# Patient Record
Sex: Female | Born: 1950
Health system: Southern US, Community
[De-identification: ages and names within clinical notes are randomized; demographics above are authoritative.]

## PROBLEM LIST (undated history)

## (undated) DIAGNOSIS — Z973 Presence of spectacles and contact lenses: Secondary | ICD-10-CM

## (undated) DIAGNOSIS — B977 Papillomavirus as the cause of diseases classified elsewhere: Secondary | ICD-10-CM

## (undated) DIAGNOSIS — E039 Hypothyroidism, unspecified: Secondary | ICD-10-CM

## (undated) DIAGNOSIS — C801 Malignant (primary) neoplasm, unspecified: Secondary | ICD-10-CM

## (undated) DIAGNOSIS — E785 Hyperlipidemia, unspecified: Secondary | ICD-10-CM

## (undated) DIAGNOSIS — M1990 Unspecified osteoarthritis, unspecified site: Secondary | ICD-10-CM

## (undated) DIAGNOSIS — Z972 Presence of dental prosthetic device (complete) (partial): Secondary | ICD-10-CM

## (undated) DIAGNOSIS — Z801 Family history of malignant neoplasm of trachea, bronchus and lung: Secondary | ICD-10-CM

## (undated) DIAGNOSIS — I209 Angina pectoris, unspecified: Secondary | ICD-10-CM

## (undated) DIAGNOSIS — K5792 Diverticulitis of intestine, part unspecified, without perforation or abscess without bleeding: Secondary | ICD-10-CM

## (undated) DIAGNOSIS — I10 Essential (primary) hypertension: Secondary | ICD-10-CM

## (undated) DIAGNOSIS — Z923 Personal history of irradiation: Secondary | ICD-10-CM

## (undated) HISTORY — PX: TONSILLECTOMY: SUR1361

## (undated) HISTORY — DX: Family history of malignant neoplasm of trachea, bronchus and lung: Z80.1

## (undated) HISTORY — DX: Diverticulitis of intestine, part unspecified, without perforation or abscess without bleeding: K57.92

## (undated) HISTORY — DX: Angina pectoris, unspecified: I20.9

## (undated) HISTORY — DX: Essential (primary) hypertension: I10

## (undated) HISTORY — PX: COLOSTOMY: SHX63

## (undated) HISTORY — DX: Papillomavirus as the cause of diseases classified elsewhere: B97.7

## (undated) HISTORY — DX: Hyperlipidemia, unspecified: E78.5

## (undated) HISTORY — PX: TUBAL LIGATION: SHX77

---

## 1999-10-17 ENCOUNTER — Other Ambulatory Visit: Admission: RE | Admit: 1999-10-17 | Discharge: 1999-10-17 | Payer: Self-pay | Admitting: *Deleted

## 1999-12-12 ENCOUNTER — Ambulatory Visit (HOSPITAL_COMMUNITY): Admission: RE | Admit: 1999-12-12 | Discharge: 1999-12-12 | Payer: Self-pay | Admitting: *Deleted

## 2001-09-10 ENCOUNTER — Ambulatory Visit (HOSPITAL_COMMUNITY): Admission: RE | Admit: 2001-09-10 | Discharge: 2001-09-10 | Payer: Self-pay | Admitting: Gastroenterology

## 2002-10-14 ENCOUNTER — Other Ambulatory Visit: Admission: RE | Admit: 2002-10-14 | Discharge: 2002-10-14 | Payer: Self-pay | Admitting: *Deleted

## 2003-10-18 ENCOUNTER — Other Ambulatory Visit: Admission: RE | Admit: 2003-10-18 | Discharge: 2003-10-18 | Payer: Self-pay | Admitting: *Deleted

## 2004-05-27 ENCOUNTER — Ambulatory Visit (HOSPITAL_COMMUNITY): Admission: RE | Admit: 2004-05-27 | Discharge: 2004-05-27 | Payer: Self-pay | Admitting: Obstetrics

## 2004-08-05 ENCOUNTER — Ambulatory Visit (HOSPITAL_COMMUNITY): Admission: RE | Admit: 2004-08-05 | Discharge: 2004-08-05 | Payer: Self-pay | Admitting: Obstetrics

## 2005-02-14 ENCOUNTER — Ambulatory Visit (HOSPITAL_COMMUNITY): Admission: RE | Admit: 2005-02-14 | Discharge: 2005-02-14 | Payer: Self-pay | Admitting: Gastroenterology

## 2010-12-29 HISTORY — PX: COLON SURGERY: SHX602

## 2011-01-19 ENCOUNTER — Encounter: Payer: Self-pay | Admitting: Obstetrics

## 2011-02-09 ENCOUNTER — Other Ambulatory Visit: Payer: Self-pay | Admitting: General Surgery

## 2011-02-10 ENCOUNTER — Ambulatory Visit (HOSPITAL_COMMUNITY): Admission: RE | Admit: 2011-02-10 | Payer: 59 | Source: Ambulatory Visit

## 2011-02-10 ENCOUNTER — Ambulatory Visit (INDEPENDENT_AMBULATORY_CARE_PROVIDER_SITE_OTHER): Payer: 59

## 2011-02-10 ENCOUNTER — Ambulatory Visit (HOSPITAL_COMMUNITY): Payer: 59

## 2011-02-10 ENCOUNTER — Inpatient Hospital Stay (HOSPITAL_COMMUNITY)
Admission: EM | Admit: 2011-02-10 | Discharge: 2011-02-20 | DRG: 330 | Disposition: A | Discharge status: Home / Self Care | Payer: 59 | Attending: Pulmonary Disease | Admitting: Pulmonary Disease

## 2011-02-10 ENCOUNTER — Inpatient Hospital Stay (INDEPENDENT_AMBULATORY_CARE_PROVIDER_SITE_OTHER)
Admission: RE | Admit: 2011-02-10 | Discharge: 2011-02-10 | Disposition: A | Discharge status: Home / Self Care | Payer: 59 | Source: Ambulatory Visit | Attending: Emergency Medicine | Admitting: Emergency Medicine

## 2011-02-10 ENCOUNTER — Inpatient Hospital Stay (HOSPITAL_COMMUNITY): Admit: 2011-02-10 | Discharge: 2011-02-10 | Payer: 59 | Attending: Emergency Medicine | Admitting: Emergency Medicine

## 2011-02-10 DIAGNOSIS — J45909 Unspecified asthma, uncomplicated: Secondary | ICD-10-CM | POA: Diagnosis present

## 2011-02-10 DIAGNOSIS — K5732 Diverticulitis of large intestine without perforation or abscess without bleeding: Secondary | ICD-10-CM

## 2011-02-10 DIAGNOSIS — I10 Essential (primary) hypertension: Secondary | ICD-10-CM | POA: Diagnosis present

## 2011-02-10 DIAGNOSIS — F17200 Nicotine dependence, unspecified, uncomplicated: Secondary | ICD-10-CM | POA: Diagnosis present

## 2011-02-10 DIAGNOSIS — K560 Paralytic ileus: Secondary | ICD-10-CM | POA: Diagnosis not present

## 2011-02-10 DIAGNOSIS — E876 Hypokalemia: Secondary | ICD-10-CM | POA: Diagnosis not present

## 2011-02-10 LAB — POCT URINALYSIS DIPSTICK
Nitrite: NEGATIVE
Protein, ur: >=300 mg/dL — AB
Urobilinogen, UA: >=8 mg/dL (ref 0.0–1.0)
pH: 6 (ref 5.0–8.0)

## 2011-02-10 LAB — CBC
Hemoglobin: 13.7 g/dL (ref 12.0–15.0)
MCH: 30.1 pg (ref 26.0–34.0)
RBC: 4.55 MIL/uL (ref 3.87–5.11)
RDW: 15.5 % (ref 11.5–15.5)

## 2011-02-10 LAB — POCT I-STAT, CHEM 8
BUN: 5 mg/dL — ABNORMAL LOW (ref 6–23)
Calcium, Ion: 1.08 mmol/L — ABNORMAL LOW (ref 1.12–1.32)
Chloride: 93 mEq/L — ABNORMAL LOW (ref 96–112)
Creatinine, Ser: 1 mg/dL (ref 0.4–1.2)
Glucose, Bld: 143 mg/dL — ABNORMAL HIGH (ref 70–99)
HCT: 39 % (ref 36.0–46.0)
Hemoglobin: 13.3 g/dL (ref 12.0–15.0)
TCO2: 32 mmol/L (ref 0–100)

## 2011-02-10 LAB — DIFFERENTIAL
Basophils Absolute: 0 10*3/uL (ref 0.0–0.1)
Basophils Relative: 0 % (ref 0–1)
Eosinophils Relative: 0 % (ref 0–5)
Lymphocytes Relative: 9 % — ABNORMAL LOW (ref 12–46)
Monocytes Absolute: 0.7 10*3/uL (ref 0.1–1.0)
Neutro Abs: 10.3 10*3/uL — ABNORMAL HIGH (ref 1.7–7.7)

## 2011-02-10 LAB — PROTIME-INR: INR: 1.06 (ref 0.00–1.49)

## 2011-02-10 LAB — TYPE AND SCREEN

## 2011-02-11 LAB — CBC
HCT: 35.6 % — ABNORMAL LOW (ref 36.0–46.0)
Hemoglobin: 12 g/dL (ref 12.0–15.0)
MCHC: 33.7 g/dL (ref 30.0–36.0)
RBC: 4.13 MIL/uL (ref 3.87–5.11)
RDW: 15.8 % — ABNORMAL HIGH (ref 11.5–15.5)
WBC: 11.4 10*3/uL — ABNORMAL HIGH (ref 4.0–10.5)

## 2011-02-11 LAB — BASIC METABOLIC PANEL
BUN: 4 mg/dL — ABNORMAL LOW (ref 6–23)
CO2: 30 mEq/L (ref 19–32)
Chloride: 98 mEq/L (ref 96–112)
Creatinine, Ser: 0.77 mg/dL (ref 0.4–1.2)
Glucose, Bld: 169 mg/dL — ABNORMAL HIGH (ref 70–99)
Potassium: 3.3 mEq/L — ABNORMAL LOW (ref 3.5–5.1)
Sodium: 137 mEq/L (ref 135–145)

## 2011-02-11 LAB — GLUCOSE, CAPILLARY: Glucose-Capillary: 131 mg/dL — ABNORMAL HIGH (ref 70–99)

## 2011-02-11 LAB — ABO/RH: ABO/RH(D): O POS

## 2011-02-12 LAB — CBC
MCH: 28.4 pg (ref 26.0–34.0)
MCHC: 32.8 g/dL (ref 30.0–36.0)
MCV: 86.4 fL (ref 78.0–100.0)
RBC: 3.91 MIL/uL (ref 3.87–5.11)
RDW: 16.1 % — ABNORMAL HIGH (ref 11.5–15.5)

## 2011-02-12 LAB — BASIC METABOLIC PANEL
CO2: 26 mEq/L (ref 19–32)
Calcium: 8 mg/dL — ABNORMAL LOW (ref 8.4–10.5)
GFR calc Af Amer: >60 mL/min (ref >60)
GFR calc non Af Amer: >60 mL/min (ref >60)
Sodium: 135 mEq/L (ref 135–145)

## 2011-02-12 LAB — GLUCOSE, CAPILLARY
Glucose-Capillary: 98 mg/dL (ref 70–99)
Glucose-Capillary: 90 mg/dL (ref 70–99)
Glucose-Capillary: 102 mg/dL — ABNORMAL HIGH (ref 70–99)

## 2011-02-13 LAB — GLUCOSE, CAPILLARY
Glucose-Capillary: 103 mg/dL — ABNORMAL HIGH (ref 70–99)
Glucose-Capillary: 105 mg/dL — ABNORMAL HIGH (ref 70–99)
Glucose-Capillary: 105 mg/dL — ABNORMAL HIGH (ref 70–99)
Glucose-Capillary: 97 mg/dL (ref 70–99)

## 2011-02-14 LAB — GLUCOSE, CAPILLARY
Glucose-Capillary: 105 mg/dL — ABNORMAL HIGH (ref 70–99)
Glucose-Capillary: 113 mg/dL — ABNORMAL HIGH (ref 70–99)
Glucose-Capillary: 91 mg/dL (ref 70–99)
Glucose-Capillary: 96 mg/dL (ref 70–99)

## 2011-02-14 LAB — BASIC METABOLIC PANEL
BUN: 6 mg/dL (ref 6–23)
Chloride: 102 mEq/L (ref 96–112)
GFR calc Af Amer: >60 mL/min (ref >60)
GFR calc non Af Amer: >60 mL/min (ref >60)
Potassium: 3.3 mEq/L — ABNORMAL LOW (ref 3.5–5.1)
Sodium: 142 mEq/L (ref 135–145)

## 2011-02-15 LAB — GLUCOSE, CAPILLARY
Glucose-Capillary: 111 mg/dL — ABNORMAL HIGH (ref 70–99)
Glucose-Capillary: 126 mg/dL — ABNORMAL HIGH (ref 70–99)

## 2011-02-16 LAB — GLUCOSE, CAPILLARY
Glucose-Capillary: 83 mg/dL (ref 70–99)
Glucose-Capillary: 91 mg/dL (ref 70–99)
Glucose-Capillary: 112 mg/dL — ABNORMAL HIGH (ref 70–99)

## 2011-02-16 NOTE — H&P (Signed)
Tracie [name], Tracie [name]             ACCOUNT NO.:  [id number]  MEDICAL RECORD NO.:  [id number]           PATIENT TYPE:  O  LOCATION:  URG                          FACILITY:  [facility]  PHYSICIAN:  Almond Lint, MD       DATE OF BIRTH:  [birth year]  DATE OF ADMISSION:  02/10/2011 DATE OF DISCHARGE:                             HISTORY & PHYSICAL   CHIEF COMPLAINT:  Free air.  HISTORY OF PRESENT ILLNESS:  Tracie [name] is a 60 year old female with around 3 days of abdominal pain.  Today is Monday and on Friday she developed severe left lower quadrant pain radiating around her left flank.  She had nausea and vomiting on that day and sweats and chills. Over the weekend, the pain subsided somewhat but she did have dysuria and suprapubic pain.  She is followed pain and point in time.  Today, the pain became worse and diffuse across the lower abdomen.  She went to her urgent care where an x-ray demonstrated free air.  She did not have any additional fever or chills.  She denied diarrhea.  She denied chest pain, shortness of breath.  She has no hematemesis or no hematuria.  She did complain of dark urine.  She is complaining of a bloody diarrhea. She did have dark urine that was reddish.  PAST MEDICAL HISTORY:  Significant for hypertension and asthma.  PAST SURGICAL HISTORY:  Tonsillectomy, tubal ligation, and C-section.  FAMILY HISTORY:  No sick contacts.  No severe illnesses that she is aware of.  SOCIAL HISTORY:  She is here with her 2 sons.  She is a smoker.  Does not drink alcohol or use drugs.  REVIEW OF SYSTEMS:  Otherwise negative x 11.  MEDICATIONS:  Hydrochlorothiazide and doxazosin.  ALLERGIES:  PEANUTS and FISH, although notably she does not have a shellfish allergy.  PHYSICAL EXAMINATION:  VITAL SIGNS:  Temperature 99, heart rate 112, blood pressure 144/88, respiratory rate 20, sat is 96% on room air. GENERAL:  Alert and oriented x3 in no acute distress. HEENT:   Normocephalic, atraumatic.  Sclerae are anicteric. NECK:  Supple.  No lymphadenopathy.  No thyromegaly.  Trachea is midline. HEART:  Regular rate and rhythm.  No murmurs. LUNGS:  Clear to auscultation bilaterally.  No wheezing, rales or rhonchi. ABDOMEN:  Soft, distended, tender in the bilateral lower quadrants and suprapubic region.  Positive rebound.  Positive voluntary guarding in the lower abdomen, nontender in the upper abdomen. EXTREMITIES:  Warm, well-perfused without pitting edema. SKIN:  No evidence of rashes. NEURO:  No gross motor or sensory deficits. PSYCH:  Mood and affect normal.  I-STAT shows a sodium of 137, potassium 2.6, chloride 93, CO2 32, BUN 5, creatinine 1.0 and glucose 143.  White count 12.1 hemoglobin and hematocrit 13.7 and 38.8 and platelet count 347.  UA shows large amount of blood and bilirubin and protein.  Coags are normal.  DIAGNOSTICS:  X-ray shows free air in the right hemidiaphragm with mild basilar atelectasis.  PLAN:  Tracie [name] will undergo exploratory laparotomy for repair of perforated viscus.  She will receive IV fluids, IV antibiotics and n.p.o..  I discussed the risks and benefits of surgery including the risk of potential cancer.  I also discussed the risk of an ostomy.  I discuss that an ostomy would require an extra surgery.  I discussed wound infection, bleeding and abscess.  The patient understands and wished to proceed.  Will go to the OR at the first available opportunity.     Almond Lint, MD     FB/MEDQ  D:  02/10/2011  T:  02/11/2011  Job:  [id number]  Electronically Signed by Almond Lint MD on 02/16/2011 12:17:41 AM

## 2011-02-16 NOTE — Op Note (Signed)
[name], [name]             ACCOUNT NO.:  [id number]  MEDICAL RECORD NO.:  [id number]           PATIENT TYPE:  LOCATION:                                 FACILITY:  PHYSICIAN:  Almond Lint, MD       DATE OF BIRTH:  09/23/1951  DATE OF PROCEDURE:  02/11/2011 DATE OF DISCHARGE:                              OPERATIVE REPORT   PREOPERATIVE DIAGNOSIS:  Free air.  POSTOPERATIVE DIAGNOSIS:  Perforated sigmoid diverticulitis.  PROCEDURES:  Sigmoidectomy with end descending colostomy in the left abdomen.  SURGEON:  Almond Lint, MD  ASSISTANT:  Clovis Pu. Cornett, MD  ANESTHESIA:  General.  FINDINGS:  Inflamed sigmoid with pus in the pelvis.  SPECIMEN:  Sigmoid colon to Pathology.  ESTIMATED BLOOD LOSS:  50 mL.  COMPLICATIONS:  None known.  PROCEDURE:  [name] was identified in the holding area and taken to the operating room where she was placed supine on the operating room table.  General anesthesia was induced.  Her abdomen was prepped anddraped in a sterile fashion after a Foley catheter was placed.  Time-out was performed according to the surgical safety check list.  When all was correct, we continued.    The infraumbilical skin was opened with a #10 blade and subcutaneous tissues and fascia were opened with cautery.  The peritoneum was elevated with two Tresa Endo and entered sharply with Metzenbaum scissors.  There was immediately a foul odor coming from the abdomen.  The peritoneum was opened to the full length of the fascial incision and the abdomen palpated.  There was clearly a very inflamed area of sigmoid and purulent drainage from the pelvis, so the incision was extended toward the pubic symphysis.  The remainder of the subcutaneous tissues and fascia were opened up toward the pubic symphysis.    The inflamed sigmoid was identified and elevated.  Around 3 cm proximal to the area of inflammation, the bowel was skeletonized and divided with the 75-mm GIA  stapler.  The inflamed area of sigmoid was elevated and the mesocolon was taken with the LigaSure.  There was one area that was suture-ligated as it was bleeding.  The sigmoid was taken down toward the rectum, and at the point where the tinea started to thin out the colon was divided with the GIA 75 again.  This staple line was marked with two 2-0 Prolene sutures.  The abdomen was then copiously irrigated with 7 L of normal saline.  This was done in all quadrants.  The area was irrigated until the irrigant returned clear.  Two Kochers were then used to pull the abdominal wall toward the midline and appropriate site for an ostomy was identified.  The skin was elevated with a Kocher and this tissue was taken with the cautery.  The subcutaneous tissues were divided all the way down into the fascia and this was opened up in a cruciate fashion. Minimal muscle fibers were split.  Three fingerbreadths were able to get through the fascia.  The sigmoid had to be mobilized more but taking down the white line of Toldt.  This was then passed through the  abdominal wall and held in place with [facility].  The omentum was then pulled down over the bowel and the fascia was closed with two #1 looped PDS suture.  The skin was copiously irrigated and then stapled.  This was covered with a towel and attention was then directed to the colostomy.  The colon was opened with the cautery and the colostomy was matured with four 4-0 Vicryl cutting sutures.  The wound was then dressed with a Tegaderm and an ostomy appliance.  The patient was awakened from anesthesia and taken to the PACU in stable condition. Needle, sponge, instrument counts were correct times two.     Almond Lint, MD     FB/MEDQ  D:  02/11/2011  T:  02/11/2011  Job:  [id number]  Electronically Signed by Almond Lint MD on 02/16/2011 12:19:31 AM

## 2011-02-17 LAB — BASIC METABOLIC PANEL
BUN: 2 mg/dL — ABNORMAL LOW (ref 6–23)
Calcium: 8.2 mg/dL — ABNORMAL LOW (ref 8.4–10.5)
Creatinine, Ser: 0.6 mg/dL (ref 0.4–1.2)
GFR calc non Af Amer: >60 mL/min (ref >60)
Glucose, Bld: 100 mg/dL — ABNORMAL HIGH (ref 70–99)

## 2011-02-17 LAB — CBC
HCT: 29.4 % — ABNORMAL LOW (ref 36.0–46.0)
MCHC: 33.7 g/dL (ref 30.0–36.0)
MCV: 84.7 fL (ref 78.0–100.0)
RDW: 16 % — ABNORMAL HIGH (ref 11.5–15.5)

## 2011-02-17 LAB — GLUCOSE, CAPILLARY: Glucose-Capillary: 144 mg/dL — ABNORMAL HIGH (ref 70–99)

## 2011-02-18 LAB — BASIC METABOLIC PANEL
BUN: 2 mg/dL — ABNORMAL LOW (ref 6–23)
GFR calc Af Amer: >60 mL/min (ref >60)
GFR calc non Af Amer: >60 mL/min (ref >60)
Potassium: 3.7 mEq/L (ref 3.5–5.1)
Sodium: 142 mEq/L (ref 135–145)

## 2011-02-18 LAB — GLUCOSE, CAPILLARY

## 2011-02-18 LAB — MAGNESIUM: Magnesium: 2 mg/dL (ref 1.5–2.5)

## 2011-02-19 LAB — BASIC METABOLIC PANEL
BUN: 4 mg/dL — ABNORMAL LOW (ref 6–23)
Creatinine, Ser: 0.63 mg/dL (ref 0.4–1.2)
GFR calc non Af Amer: >60 mL/min (ref >60)
Potassium: 4.8 mEq/L (ref 3.5–5.1)

## 2011-02-19 LAB — GLUCOSE, CAPILLARY: Glucose-Capillary: 141 mg/dL — ABNORMAL HIGH (ref 70–99)

## 2011-02-20 LAB — GLUCOSE, CAPILLARY

## 2011-02-21 LAB — GLUCOSE, CAPILLARY: Glucose-Capillary: 101 mg/dL — ABNORMAL HIGH (ref 70–99)

## 2011-02-25 NOTE — Discharge Summary (Signed)
  NAMECHABELY, [name]             ACCOUNT NO.:  [id number]  MEDICAL RECORD NO.:  [id number]           PATIENT TYPE:  I  LOCATION:  5003                         FACILITY:  [facility]  PHYSICIAN:  Tracie [name], M.D. DATE OF BIRTH:  [birth year]  DATE OF ADMISSION:  02/10/2011 DATE OF DISCHARGE:  02/20/2011                              DISCHARGE SUMMARY   HISTORY OF PRESENT ILLNESS:  Tracie [name] is a 60 year old African American female who presented with complaint of abdominal pain, nausea, vomiting, chills, and sweats.  She presented to the emergency department where evaluation including labs and plain films showed elevated white blood cell count and pneumoperitoneum.  Decision was made by Dr. Donell Beers after evaluation to take the patient directly to the operating room for surgical intervention.  SUMMARY OF HOSPITAL COURSE:  The patient was taken to the operating room on February 14 at 2 a.m. for exploratory laparotomy, sigmoid colectomy with [facility] pouch, and end colostomy.  The patient tolerated this procedure well, was admitted to the floor in a stable condition.  She had mild postoperative ileus, gradually regaining function of her bowels, starting to have some flatus come into her ostomy bag, and eventually having a small amount of loose stool develop.  She was started on liquid diet and gradually advanced from liquids to full liquids to regular diet.  Additionally, the patient who had a history of hypertension was placed on several antihypertensive agents including her home medications of doxazosin and hydrochlorothiazide.  She had a clonidine patch placed and some occasional hydralazine was used to maintain her blood pressure.  By the end of her hospitalization, her blood pressure was consistently in the 130s/80s to 140s/90s. With regards to her wound and ostomy, the patient's wound did develop some mild erythema that remained nontender and showed no evidence of drainage of  purulence or serous fluid.  Her ostomy remained pink and viable.  The ostomy wound care nurse discussed teaching and changing of her appliance thoroughly with her on several occasions.  At this point, we feel the patient is stable for discharge home with plans for followup as an outpatient.  DISCHARGE DIAGNOSES: 1. Perforated sigmoid diverticulitis, status post Hartmann procedure. 2. Hypertension - controlled. 3. Hypokalemia - repleted.  PLAN:  We will send the patient home with instructions for ostomy care and wound care.  She can shower daily keeping a dry dressing over the wound if necessary.  DISCHARGE MEDICATIONS: 1. Doxazosin 1 mg daily. 2. Hydrochlorothiazide 25 mg one-half tablet daily. 3. Percocet 5/325 one to two tablets q.6 h. p.r.n. 4. Augmentin 875 mg twice daily.  The patient is given preprinted discharge instructions to follow and will follow up in our office in approximately 1 week's time.     Tracie El, PA-C   ______________________________ Tracie [name], M.D.    KB/MEDQ  D:  02/20/2011  T:  02/20/2011  Job:  [id number]  cc:   Tracie Lint, MD  Electronically Signed by Tracie [name]  on 02/25/2011 11:32:00 AM Electronically Signed by Tracie [name] M.D. on 02/25/2011 03:10:57 PM

## 2011-03-04 ENCOUNTER — Ambulatory Visit (HOSPITAL_COMMUNITY)
Admission: RE | Admit: 2011-03-04 | Discharge: 2011-03-04 | Disposition: A | Discharge status: Home / Self Care | Payer: 59 | Source: Ambulatory Visit | Attending: General Surgery | Admitting: General Surgery

## 2011-03-04 DIAGNOSIS — M79609 Pain in unspecified limb: Secondary | ICD-10-CM | POA: Insufficient documentation

## 2011-03-04 DIAGNOSIS — I10 Essential (primary) hypertension: Secondary | ICD-10-CM | POA: Insufficient documentation

## 2011-06-26 ENCOUNTER — Telehealth (INDEPENDENT_AMBULATORY_CARE_PROVIDER_SITE_OTHER): Payer: Self-pay

## 2011-07-25 ENCOUNTER — Ambulatory Visit (INDEPENDENT_AMBULATORY_CARE_PROVIDER_SITE_OTHER): Payer: Self-pay | Admitting: General Surgery

## 2011-07-29 ENCOUNTER — Encounter (INDEPENDENT_AMBULATORY_CARE_PROVIDER_SITE_OTHER): Payer: Self-pay

## 2011-08-04 ENCOUNTER — Encounter (INDEPENDENT_AMBULATORY_CARE_PROVIDER_SITE_OTHER): Payer: Self-pay | Admitting: General Surgery

## 2011-08-04 ENCOUNTER — Ambulatory Visit (INDEPENDENT_AMBULATORY_CARE_PROVIDER_SITE_OTHER): Payer: 59 | Admitting: General Surgery

## 2011-08-04 VITALS — BP 172/114 | HR 70 | Wt 172.2 lb

## 2011-08-04 DIAGNOSIS — K5732 Diverticulitis of large intestine without perforation or abscess without bleeding: Secondary | ICD-10-CM

## 2011-08-04 DIAGNOSIS — K5720 Diverticulitis of large intestine with perforation and abscess without bleeding: Secondary | ICD-10-CM | POA: Insufficient documentation

## 2011-08-04 DIAGNOSIS — I10 Essential (primary) hypertension: Secondary | ICD-10-CM

## 2011-08-04 MED ORDER — DOXAZOSIN MESYLATE 1 MG PO TABS: 1.0000 mg | ORAL_TABLET | Freq: Every day | ORAL | Status: DC

## 2011-08-04 NOTE — Assessment & Plan Note (Signed)
Stressed importance of PCP Gave one month refill of cardura

## 2011-08-04 NOTE — Assessment & Plan Note (Signed)
Doing well Back to work Appetite and energy level back to normal Has small peristomal hernia Plan colostomy takedown after patient gets colonoscopy.   Pt would like October date.

## 2011-08-04 NOTE — Progress Notes (Signed)
Tracie [name] is a 60 y.o. female.    Chief Complaint  Patient presents with  . Other    3 month colon recheck    HPI HPI Pt is doing well.  She has not been having abdominal pain.  She does have some itching around her stoma.  She denies difficulty with appetite.  She denies fevers/chills.  She has noted some swelling around her ostomy.  She has returned to her prior energy level.  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    Past Medical History  Diagnosis Date  . Diverticulitis   . Hypertension   . Asthma   . HPV in female     Past Surgical History  Procedure Date  . Colon surgery 2012    sigmoidectomy    Family History  Problem Relation Age of Onset  . Cancer Mother   . Hypertension Brother   . Hypertension Brother     Social History History  Substance Use Topics  . Smoking status: Current Everyday Smoker  . Smokeless tobacco: Not on file  . Alcohol Use: Yes     occasional    Allergies  Allergen Reactions  . Fish Allergy Anaphylaxis and Swelling    Swelling of hands and feet.  . Peanut-Containing Drug Products Anaphylaxis    Tree nuts included in allergic reaction.    Current Outpatient Prescriptions  Medication Sig Dispense Refill  . doxazosin (CARDURA) 1 MG tablet Take 1 tablet (1 mg total) by mouth at bedtime.  30 tablet  0  . hydrochlorothiazide (,MICROZIDE/HYDRODIURIL,) 12.5 MG capsule Take 12.5 mg by mouth daily.        Marland Kitchen oxyCODONE-acetaminophen (PERCOCET) 5-325 MG per tablet Take 1 tablet by mouth every 6 (six) hours as needed. 1-2 q 6 hrs prn pain         Review of Systems Review of Systems  All other systems reviewed and are negative.    Physical Exam Physical Exam  Constitutional: She is oriented to person, place, and time. She appears well-developed and well-nourished. No distress.  HENT:  Head: Normocephalic and atraumatic.  Eyes: Conjunctivae are normal. Pupils are equal, round, and reactive to light. No scleral icterus.  Neck:  Normal range of motion. Neck supple.  GI: Bowel sounds are normal. She exhibits no distension. Mass: peristomal hernia. There is no tenderness. There is no rebound and no guarding.  Musculoskeletal: Normal range of motion. She exhibits no edema and no tenderness.  Neurological: She is alert and oriented to person, place, and time. Coordination normal.  Skin: Skin is warm and dry. No rash noted. She is not diaphoretic. No erythema. No pallor.  Psychiatric: She has a normal mood and affect. Her behavior is normal. Judgment and thought content normal.     Blood pressure 172/114, pulse 70, weight 172 lb 3.2 oz (78.109 kg).  Assessment/Plan   Diverticulitis of colon with perforation, sigmoid colectomy/ostomy 02/11/2011 Doing well Back to work Appetite and energy level back to normal Has small peristomal hernia Plan colostomy takedown after patient gets colonoscopy.   Pt would like October date.    Benign essential HTN Stressed importance of PCP Gave one month refill of cardura      Blane Worthington 08/04/2011, 12:07 PM

## 2011-09-16 ENCOUNTER — Other Ambulatory Visit (INDEPENDENT_AMBULATORY_CARE_PROVIDER_SITE_OTHER): Payer: Self-pay | Admitting: General Surgery

## 2011-09-16 DIAGNOSIS — K5792 Diverticulitis of intestine, part unspecified, without perforation or abscess without bleeding: Secondary | ICD-10-CM

## 2011-09-18 ENCOUNTER — Telehealth (INDEPENDENT_AMBULATORY_CARE_PROVIDER_SITE_OTHER): Payer: Self-pay | Admitting: General Surgery

## 2011-09-30 ENCOUNTER — Encounter (HOSPITAL_COMMUNITY)
Admission: RE | Admit: 2011-09-30 | Discharge: 2011-09-30 | Disposition: A | Discharge status: Home / Self Care | Payer: 59 | Source: Ambulatory Visit | Attending: General Surgery | Admitting: General Surgery

## 2011-09-30 ENCOUNTER — Other Ambulatory Visit (INDEPENDENT_AMBULATORY_CARE_PROVIDER_SITE_OTHER): Payer: Self-pay | Admitting: General Surgery

## 2011-09-30 DIAGNOSIS — Z01811 Encounter for preprocedural respiratory examination: Secondary | ICD-10-CM

## 2011-09-30 LAB — BASIC METABOLIC PANEL
CO2: 32 mEq/L (ref 19–32)
Chloride: 97 mEq/L (ref 96–112)
Glucose, Bld: 100 mg/dL — ABNORMAL HIGH (ref 70–99)
Potassium: 3.1 mEq/L — ABNORMAL LOW (ref 3.5–5.1)
Sodium: 139 mEq/L (ref 135–145)

## 2011-09-30 LAB — SURGICAL PCR SCREEN: Staphylococcus aureus: POSITIVE — AB

## 2011-09-30 LAB — CBC
Hemoglobin: 13.5 g/dL (ref 12.0–15.0)
RBC: 4.66 MIL/uL (ref 3.87–5.11)
WBC: 6.6 10*3/uL (ref 4.0–10.5)

## 2011-10-03 NOTE — Progress Notes (Signed)
Quick Note:  Labs ok for surgery ______ 

## 2011-10-06 ENCOUNTER — Other Ambulatory Visit (INDEPENDENT_AMBULATORY_CARE_PROVIDER_SITE_OTHER): Payer: Self-pay | Admitting: General Surgery

## 2011-10-06 ENCOUNTER — Inpatient Hospital Stay (HOSPITAL_COMMUNITY)
Admission: RE | Admit: 2011-10-06 | Discharge: 2011-10-12 | DRG: 346 | Disposition: A | Discharge status: Home / Self Care | Payer: 59 | Source: Ambulatory Visit | Attending: General Surgery | Admitting: General Surgery

## 2011-10-06 DIAGNOSIS — J45909 Unspecified asthma, uncomplicated: Secondary | ICD-10-CM | POA: Diagnosis present

## 2011-10-06 DIAGNOSIS — Z433 Encounter for attention to colostomy: Secondary | ICD-10-CM

## 2011-10-06 DIAGNOSIS — J984 Other disorders of lung: Secondary | ICD-10-CM | POA: Diagnosis present

## 2011-10-06 DIAGNOSIS — Z8619 Personal history of other infectious and parasitic diseases: Secondary | ICD-10-CM

## 2011-10-06 DIAGNOSIS — Z01812 Encounter for preprocedural laboratory examination: Secondary | ICD-10-CM

## 2011-10-06 DIAGNOSIS — F17200 Nicotine dependence, unspecified, uncomplicated: Secondary | ICD-10-CM | POA: Diagnosis present

## 2011-10-06 DIAGNOSIS — Z809 Family history of malignant neoplasm, unspecified: Secondary | ICD-10-CM

## 2011-10-06 DIAGNOSIS — K5732 Diverticulitis of large intestine without perforation or abscess without bleeding: Principal | ICD-10-CM | POA: Diagnosis present

## 2011-10-06 DIAGNOSIS — I10 Essential (primary) hypertension: Secondary | ICD-10-CM | POA: Diagnosis present

## 2011-10-06 HISTORY — PX: COLOSTOMY TAKEDOWN: SHX5258

## 2011-10-06 LAB — COMPREHENSIVE METABOLIC PANEL
ALT: 14 U/L (ref 0–35)
AST: 15 U/L (ref 0–37)
Calcium: 10 mg/dL (ref 8.4–10.5)
Creatinine, Ser: 0.74 mg/dL (ref 0.50–1.10)
GFR calc Af Amer: >90 mL/min (ref >90)
Sodium: 134 mEq/L — ABNORMAL LOW (ref 135–145)
Total Protein: 7.8 g/dL (ref 6.0–8.3)

## 2011-10-06 LAB — TYPE AND SCREEN: ABO/RH(D): O POS

## 2011-10-07 LAB — CBC
Hemoglobin: 13.4 g/dL (ref 12.0–15.0)
MCV: 86.3 fL (ref 78.0–100.0)
Platelets: 327 10*3/uL (ref 150–400)
RBC: 4.59 MIL/uL (ref 3.87–5.11)
WBC: 6.7 10*3/uL (ref 4.0–10.5)

## 2011-10-07 LAB — BASIC METABOLIC PANEL
BUN: 6 mg/dL (ref 6–23)
Chloride: 95 mEq/L — ABNORMAL LOW (ref 96–112)
GFR calc Af Amer: >90 mL/min (ref >90)
Glucose, Bld: 162 mg/dL — ABNORMAL HIGH (ref 70–99)
Potassium: 3 mEq/L — ABNORMAL LOW (ref 3.5–5.1)
Sodium: 133 mEq/L — ABNORMAL LOW (ref 135–145)

## 2011-10-07 LAB — MAGNESIUM: Magnesium: 1.8 mg/dL (ref 1.5–2.5)

## 2011-10-08 LAB — BASIC METABOLIC PANEL
BUN: 4 mg/dL — ABNORMAL LOW (ref 6–23)
Chloride: 96 mEq/L (ref 96–112)
Creatinine, Ser: 0.62 mg/dL (ref 0.50–1.10)
Glucose, Bld: 143 mg/dL — ABNORMAL HIGH (ref 70–99)
Potassium: 3.2 mEq/L — ABNORMAL LOW (ref 3.5–5.1)

## 2011-10-08 LAB — CBC
HCT: 35.9 % — ABNORMAL LOW (ref 36.0–46.0)
Hemoglobin: 12.1 g/dL (ref 12.0–15.0)
MCV: 86.5 fL (ref 78.0–100.0)
WBC: 9 10*3/uL (ref 4.0–10.5)

## 2011-10-09 LAB — BASIC METABOLIC PANEL
BUN: 4 mg/dL — ABNORMAL LOW (ref 6–23)
Calcium: 9.3 mg/dL (ref 8.4–10.5)
Creatinine, Ser: 0.62 mg/dL (ref 0.50–1.10)
GFR calc non Af Amer: >90 mL/min (ref >90)
Glucose, Bld: 128 mg/dL — ABNORMAL HIGH (ref 70–99)
Potassium: 3.6 mEq/L (ref 3.5–5.1)

## 2011-10-09 LAB — CBC
HCT: 35.4 % — ABNORMAL LOW (ref 36.0–46.0)
Hemoglobin: 11.9 g/dL — ABNORMAL LOW (ref 12.0–15.0)
MCH: 29 pg (ref 26.0–34.0)
MCHC: 33.6 g/dL (ref 30.0–36.0)
MCV: 86.3 fL (ref 78.0–100.0)

## 2011-10-10 LAB — POTASSIUM: Potassium: 3.5 mEq/L (ref 3.5–5.1)

## 2011-10-10 LAB — CBC
Hemoglobin: 11.6 g/dL — ABNORMAL LOW (ref 12.0–15.0)
MCH: 28.9 pg (ref 26.0–34.0)
MCHC: 33.3 g/dL (ref 30.0–36.0)
RDW: 16.2 % — ABNORMAL HIGH (ref 11.5–15.5)

## 2011-10-14 NOTE — Op Note (Signed)
NAMEFRANCHELLE, Tracie [name]             ACCOUNT NO.:  [id number]  MEDICAL RECORD NO.:  [id number]  LOCATION:  2899                         FACILITY:  [facility]  PHYSICIAN:  Almond Lint, MD       DATE OF BIRTH:  1951/08/28  DATE OF PROCEDURE:  10/06/2011 DATE OF DISCHARGE:                              OPERATIVE REPORT   PREOPERATIVE DIAGNOSIS:  Diverticulitis.  POSTOPERATIVE DIAGNOSIS:  Diverticulitis.  PROCEDURE PERFORMED:  Colostomy takedown.  SURGEON:  Almond Lint, MD  ASSISTANT:  Dr. Ollen Gross. Vernell Morgans, MD  ANESTHESIA:  General.  FINDINGS:  Some adhesions.  SPECIMEN:  Proximal rectum to pathology.  ESTIMATED BLOOD LOSS:  50 mL.  COMPLICATIONS:  None known.  PROCEDURE:  Ms. Trinka was identified in the holding area and taken to the operating room where she was placed supine on the operating room table.  General anesthesia was induced.  She was placed into the low lithotomy position.  Several mucous balls were removed from her rectum. This was irrigated.  The abdomen and perineum were prepped and draped in standard fashion.  The ostomy was closed with a Vicryl suture.  Midline incision was opened with a #10 blade.  Subcutaneous tissues were divided with the cautery.  The fascia was elevated with 2 Kochers and entered sharply with a #10 blade.  The digital retraction was then used to protect the intraabdominal contents while the fascia was opened in the length of the skin incision.  The Kochers were then used to elevate the abdominal wall and the adhesions were taken off behind the abdominal wall.   The Bookwalter retractor was then placed to assist with visualization.  Most of the adhesions around the back of the ostomy were taken down and then the rectal stump was identified and elevated.  There was good length on the stump.  The attention was then directed to the ostomy.  The skin was incised with a #10 blade around the ostomy and then the cautery was used to divide the  subcutaneous tissue.  A tonsil was used to get into the pericolonic plane.  This was elevated and the remainder of the adhesions were taken off of the stomal end.  This was taken and retracted back into the abdomen.  The end was removed with cautery.    This was sized and it was felt that a 29 EEA would make a good anastomosis.  The area was checked from the rectum and the 29 also would enter from the rectum.  The 29 EEA stapler was then passed from the anus.  At the sacral promontory, the stapler did not want to passes easily into the colon.  The mesentery was divided with the ligature and the colon was stapled off with the contour stapler.    The anvil was then secured into the proximal colon with the 0 Prolene pursestring suture.  This was then coupled to the spike from the other end of the stapler and closed.  The fallopian tube was pulled out of the way.  The abdominal organs were intact.  The anterior wall appeared a little bit thin and several reinforcing 3-0 Vicryl sutures were placed on the anterior anastomosis.  This was then tested with a proctoscope and there was no sign of air leakage from the anastomosis.  The ostomy site was then closed with 2 layers of #1 Novafil pop-off sutures.    The On-Q tunneler was then placed on either side of the incision.  The abdomen was irrigated.  Seprafilm was placed in the abdomen on top of the abdominal contents and behind the ostomy incision.  The fascia was closed with #1 looped PDS suture.  The skin was then irrigated and closed with staples.  Patient tolerated procedure well.  She was awakened from anesthesia and taken to the PACU in stable condition.  Needle, sponge, and instrument counts were correct.     Almond Lint, MD     FB/MEDQ  D:  10/06/2011  T:  10/06/2011  Job:  [id number]  Electronically Signed by Almond Lint MD on 10/14/2011 08:12:46 PM

## 2011-10-15 ENCOUNTER — Ambulatory Visit (INDEPENDENT_AMBULATORY_CARE_PROVIDER_SITE_OTHER): Payer: 59

## 2011-10-15 ENCOUNTER — Other Ambulatory Visit (INDEPENDENT_AMBULATORY_CARE_PROVIDER_SITE_OTHER): Payer: Self-pay | Admitting: General Surgery

## 2011-10-15 DIAGNOSIS — G8918 Other acute postprocedural pain: Secondary | ICD-10-CM

## 2011-10-15 MED ORDER — HYDROCODONE-ACETAMINOPHEN 5-325 MG PO TABS: 1.0000 | ORAL_TABLET | Freq: Four times a day (QID) | ORAL | Status: AC | PRN

## 2011-10-15 NOTE — Progress Notes (Signed)
The pt is here today 1 week post from her colostomy takedown for staple removal and dressing change.  The wounds are healing well.  I removed her staples and the gauze packing from the previous colostomy site, and applied steri strips.  I repacked the colostomy site wound with 2" gauze and instructed the pt to remove the gauze tomorrow when she showered, and replace it with another.  She seemed to understand these instructions.  She will return in 3 weeks to see Dr. Donell Beers.

## 2011-10-31 ENCOUNTER — Encounter (INDEPENDENT_AMBULATORY_CARE_PROVIDER_SITE_OTHER): Payer: Self-pay | Admitting: General Surgery

## 2011-10-31 ENCOUNTER — Ambulatory Visit (INDEPENDENT_AMBULATORY_CARE_PROVIDER_SITE_OTHER): Payer: 59 | Admitting: General Surgery

## 2011-10-31 VITALS — BP 128/92 | HR 91 | Temp 97.2°F | Ht 63.5 in | Wt 169.4 lb

## 2011-10-31 DIAGNOSIS — K5720 Diverticulitis of large intestine with perforation and abscess without bleeding: Secondary | ICD-10-CM

## 2011-10-31 DIAGNOSIS — K5732 Diverticulitis of large intestine without perforation or abscess without bleeding: Secondary | ICD-10-CM

## 2011-10-31 MED ORDER — TRAMADOL HCL 50 MG PO TABS: 50.0000 mg | ORAL_TABLET | Freq: Four times a day (QID) | ORAL | Status: AC | PRN

## 2011-10-31 NOTE — Assessment & Plan Note (Addendum)
Doing ok. Around where she should be at 3 weeks post op. Follow up in 4 weeks. Will need 8 weeks out of work.

## 2011-10-31 NOTE — Progress Notes (Signed)
HISTORY: Pt is doing well status post colostomy takedown.  She is having normal bowel movements most of the time, but has had some issue with constipation.  She had some bright red blood per rectum in the hospital, but her CBC was stable in house.  This has completely resolved.  Her appetite is slowly improving.  She is still tired much of the time.  She is trying to eliminate narcotic use.  She is not having any nausea/vomiting/fever/chills.    EXAM: Head: Normocephalic and atraumatic.  Eyes:  Conjunctivae are normal. Pupils are equal, round, and reactive to light. No scleral icterus.  Resp: No respiratory distress, normal effort. Abd:  Abdomen is soft, non distended and non tender. Incision is healing well.  There is no edema, erythema, or drainage at midline or LLQ ostomy incision.   Neurological: Alert and oriented to person, place, and time. Coordination normal.  Skin: Skin is warm and dry. No rash noted. No diaphoretic. No erythema. No pallor.  Psychiatric: Normal mood and affect. Normal behavior. Judgment and thought content normal.    Pathology reviewed and demonstrates:  BENIGN COLORECTAL MUCOSA. - NO SIGNIFICANT INFLAMMATION, DYSPLASIA OR MALIGNANCY IDENTIFIED.  ASSESSMENT AND PLAN:   Diverticulitis of colon with perforation, sigmoid colectomy/ostomy 02/11/2011 Doing ok. Around where she should be at 3 weeks post op. Follow up in 4 weeks. Will need 8 weeks out of work.         Maudry Diego, MD Surgical Oncology, General & Endocrine Surgery [facility]  No primary provider on file. No ref. provider found

## 2011-11-06 NOTE — Discharge Summary (Signed)
Tracie [name], Tracie [name]             ACCOUNT NO.:  [id number]  MEDICAL RECORD NO.:  [id number]  LOCATION:  5150                         FACILITY:  [facility]  PHYSICIAN:  Almond Lint, MD       DATE OF BIRTH:  December 13, 1951  DATE OF ADMISSION:  10/06/2011 DATE OF DISCHARGE:  10/12/2011                              DISCHARGE SUMMARY   PRINCIPAL FINAL DISCHARGE DIAGNOSIS:  Diverticulitis.  SECONDARY DIAGNOSES:  Hypertension, asthma and Human papillomavirus.  DISCHARGE MEDICATIONS: 1. Hydrochlorothiazide 25 mg once a day. 2. Doxazosin 1 mg once a day. 3. Albuterol inhaler as needed. 4. Ibuprofen 200 mg q.4 hours as needed for pain.  DISCHARGE INSTRUCTIONS:  No heavy lifting for 8 weeks.  No driving for 2 weeks.  Heart-healthy diet, and dry dressing as needed to abdominal wounds.  PRINCIPAL PROCEDURE:  Colostomy take down.  HOSPITAL COURSE:  The patient was admitted to the hospital floor following her colostomy takedown.  Also, she had hypokalemia while inpatient that required repletion.  She was able to void spontaneously with her catheter out.  She was started on incentive spirometer and assisted with ambulation.  She was on a PCA initially for pain control. Her bowel function started to improve, and she started to pass gas.  Her diet was advanced.  She was transitioned to oral narcotics and was tolerable at that time.  She did have a little bit nausea and postop ileus.  This resolved and the patient was discharged to home on postop day 6 in improved condition.  LABORATORY DATA:  The patient had a discharge hematocrit of 34.8, which was stable and her potassium was up to 3.5 prior to discharge.     Almond Lint, MD     FB/MEDQ  D:  11/06/2011  T:  11/06/2011  Job:  [id number]

## 2011-11-28 ENCOUNTER — Ambulatory Visit (INDEPENDENT_AMBULATORY_CARE_PROVIDER_SITE_OTHER): Payer: 59 | Admitting: General Surgery

## 2011-11-28 ENCOUNTER — Encounter (INDEPENDENT_AMBULATORY_CARE_PROVIDER_SITE_OTHER): Payer: Self-pay | Admitting: General Surgery

## 2011-11-28 VITALS — BP 154/100 | HR 80 | Temp 96.9°F | Resp 16 | Ht 63.0 in | Wt 168.8 lb

## 2011-11-28 DIAGNOSIS — K5720 Diverticulitis of large intestine with perforation and abscess without bleeding: Secondary | ICD-10-CM

## 2011-11-28 DIAGNOSIS — K5732 Diverticulitis of large intestine without perforation or abscess without bleeding: Secondary | ICD-10-CM

## 2011-11-28 NOTE — Assessment & Plan Note (Signed)
Doing well. Ok to return to work Follow up as needed.

## 2011-11-28 NOTE — Progress Notes (Signed)
HISTORY: Patient is status post open colostomy takedown on 10 8 2012. She is doing well. Her energy level is still low but is improving. She is no longer taking any analgesic medication. She denies nausea  or vomiting. Her appetite is continuing to improve. She is having issues of constipation and gas. She occasionally feels some pressure in the left lower quadrant. She denies fevers, chills, or wound drainage.   PERTINENT REVIEW OF SYSTEMS: Otherwise negative.     EXAM: Head: Normocephalic and atraumatic.  Eyes:  Conjunctivae are normal. Pupils are equal, round, and reactive to light. No scleral icterus.  Neck:  Normal range of motion. Neck supple. No tracheal deviation present. No thyromegaly present.  Resp: No respiratory distress, normal effort. Abd:  Abdomen is soft, non distended and non tender. No masses are palpable.  There is no rebound and no guarding.  Neurological: Alert and oriented to person, place, and time. Coordination normal.  Skin: Skin is warm and dry. No rash noted. No diaphoretic. No erythema. No pallor.  Psychiatric: Normal mood and affect. Normal behavior. Judgment and thought content normal.    ASSESSMENT AND PLAN:   Diverticulitis of colon with perforation, sigmoid colectomy/ostomy 02/11/2011, ostomy takedown 10/06/2011 Doing well. Ok to return to work Follow up as needed.         Maudry Diego, MD Surgical Oncology, General & Endocrine Surgery [facility]  No primary provider on file. No ref. provider found

## 2011-11-28 NOTE — Patient Instructions (Signed)
Add stool softeners and laxatives as needed for constipation.

## 2014-09-25 ENCOUNTER — Institutional Professional Consult (permissible substitution): Payer: 59 | Admitting: Internal Medicine

## 2014-10-06 ENCOUNTER — Ambulatory Visit (INDEPENDENT_AMBULATORY_CARE_PROVIDER_SITE_OTHER): Payer: 59 | Admitting: Internal Medicine

## 2014-10-06 ENCOUNTER — Ambulatory Visit (INDEPENDENT_AMBULATORY_CARE_PROVIDER_SITE_OTHER)
Admission: RE | Admit: 2014-10-06 | Discharge: 2014-10-06 | Disposition: A | Discharge status: Home / Self Care | Payer: 59 | Source: Ambulatory Visit | Attending: Internal Medicine | Admitting: Internal Medicine

## 2014-10-06 ENCOUNTER — Encounter: Payer: Self-pay | Admitting: Internal Medicine

## 2014-10-06 ENCOUNTER — Encounter (INDEPENDENT_AMBULATORY_CARE_PROVIDER_SITE_OTHER): Payer: Self-pay

## 2014-10-06 VITALS — BP 162/94 | HR 103 | Temp 98.5°F | Ht 64.0 in | Wt 182.0 lb

## 2014-10-06 DIAGNOSIS — R0600 Dyspnea, unspecified: Secondary | ICD-10-CM

## 2014-10-06 DIAGNOSIS — F17210 Nicotine dependence, cigarettes, uncomplicated: Secondary | ICD-10-CM

## 2014-10-06 DIAGNOSIS — J45909 Unspecified asthma, uncomplicated: Secondary | ICD-10-CM | POA: Insufficient documentation

## 2014-10-06 DIAGNOSIS — Z720 Tobacco use: Secondary | ICD-10-CM

## 2014-10-06 MED ORDER — MOMETASONE FURO-FORMOTEROL FUM 100-5 MCG/ACT IN AERO: INHALATION_SPRAY | RESPIRATORY_TRACT | Status: DC

## 2014-10-06 NOTE — Progress Notes (Signed)
Subjective:    Patient ID: Tracie [name], female    DOB: 10/10/1951   MRN: [id number]  HPI  18 yobf active smoker with asthma onset as child esp with colds worse by teenager then better but still with colds or exp to extreme heat  needed primatene then prn ventolin gets from [place] where free referred to pulmonary clinic on 10/06/2014 by Precious Haws with refractory chest tightness better with saba    10/06/2014 1st [facility] office visit/ Rotha Cassels   Chief Complaint  Patient presents with  . Pulmonary Consult    Referred by Dr. Precious Haws.  Pt c/o DOE and chest discomfort for the past 3 months. She also c/o cough with clear to tan sputum.   bad reaction to lisinopril with cough but off since  9/29/116 and rx prednisone but stil hoarse and cough productive of clear mucus.  Chest tightness some better and last dose prednisone Oct 5 Using saba up to every 4 hours including 2 h prior to OV    Cards eval [place] > increased amlodipine to 10 mg and f/u stress test in one week planned    No obvious other patterns in day to day or daytime variabilty or assoc chronic cough or cp or chest tightness, subjective wheeze overt sinus or hb symptoms. No unusual exp hx or h/o childhood pna/ asthma or knowledge of premature birth.  Sleeping ok without nocturnal  or early am exacerbation  of respiratory  c/o's or need for noct saba. Also denies any obvious fluctuation of symptoms with weather or environmental changes or other aggravating or alleviating factors except as outlined above   Current Medications, Allergies, Complete Past Medical History, Past Surgical History, Family History, and Social History were reviewed in [employer] record.           Review of Systems  Constitutional: Negative for fever, chills and unexpected weight change.  HENT: Negative for congestion, dental problem, ear pain, nosebleeds, postnasal drip, rhinorrhea, sinus pressure, sneezing, sore throat,  trouble swallowing and voice change.   Eyes: Negative for visual disturbance.  Respiratory: Positive for cough and shortness of breath. Negative for choking.   Cardiovascular: Negative for chest pain and leg swelling.  Gastrointestinal: Negative for vomiting, abdominal pain and diarrhea.  Genitourinary: Negative for difficulty urinating.  Musculoskeletal: Negative for arthralgias.  Skin: Negative for rash.  Neurological: Negative for tremors, syncope and headaches.  Hematological: Does not bruise/bleed easily.       Objective:   Physical Exam   amb bf with mild voice fatigue 2 h p last saba   Wt Readings from Last 3 Encounters:  10/06/14 182 lb (82.555 kg)  11/28/11 168 lb 12.8 oz (76.567 kg)  10/31/11 169 lb 6.4 oz (76.839 kg)      HEENT: top dentures o/w nl dentition, turbinates, and orophanx. Nl external ear canals without cough reflex   NECK :  without JVD/Nodes/TM/ nl carotid upstrokes bilaterally   LUNGS: no acc muscle use, clear to A and P bilaterally without cough on insp or exp maneuvers   CV:  RRR  no s3 or murmur or increase in P2, no edema   ABD:  soft and nontender with  Late insp hoover's  in the supine position. No bruits or organomegaly, bowel sounds nl  MS:  warm without deformities, calf tenderness, cyanosis or clubbing  SKIN: warm and dry without lesions    NEURO:  alert, approp, no deficits     CXR  10/06/2014 :  1. New, mild cardiac enlargement.  2. No acute airspace disease.      Assessment & Plan:

## 2014-10-06 NOTE — Assessment & Plan Note (Signed)
-   spirometry 10/06/2014 FEV1  1.16 ( 59%) ratio 68   She could also be considered a GOLD II copd but either way her airway control is poor  DDX of  difficult airways management all start with A and  include Adherence, Ace Inhibitors, Acid Reflux, Active Sinus Disease, Alpha 1 Antitripsin deficiency, Anxiety masquerading as Airways dz,  ABPA,  allergy(esp in young), Aspiration (esp in elderly), Adverse effects of DPI,  Active smokers, plus two Bs  = Bronchiectasis and Beta blocker use..and one C= CHF  In this case Adherence is the biggest issue and starts with  inability to use HFA effectively and also  understand that SABA treats the symptoms but doesn't get to the underlying problem (inflammation).  I used  the analogy of putting steroid cream on a rash to help explain the meaning of topical therapy and the need to get the drug to the target tissue.   - The proper method of use, as well as anticipated side effects, of a metered-dose inhaler are discussed and demonstrated to the patient. Improved effectiveness after extensive coaching during this visit to a level of approximately  75% so try dulera 100 2bid since less likely to make her cough than the dpis or higher strength ICS   Active smoking > see sep a/p

## 2014-10-06 NOTE — Patient Instructions (Addendum)
Please remember to go to the   x-ray department downstairs for your tests - we will call you with the results when they are available.  dulera 100 Take 2 puffs first thing in am and then another 2 puffs about 12 hours later.   Only use your albuterol (ventolin) as a rescue medication to be used if you can't catch your breath by resting or doing a relaxed purse lip breathing pattern.  - The less you use it, the better it will work when you need it. - Ok to use up to 2 puffs  every 4 hours if you must but call for immediate appointment if use goes up over your usual need - goal is twice a week - Don't leave home without it !!  (think of it like the spare tire for your car)   Work on inhaler technique:  relax and gently blow all the way out then take a nice smooth deep breath back in, triggering the inhaler at same time you start breathing in.  Hold for up to 5 seconds if you can.  Rinse and gargle with water when done    Please schedule a follow up office visit in 4 weeks, sooner if needed with pfts

## 2014-10-06 NOTE — Assessment & Plan Note (Signed)

## 2014-10-09 ENCOUNTER — Encounter: Payer: Self-pay | Admitting: Internal Medicine

## 2014-10-09 ENCOUNTER — Telehealth: Payer: Self-pay | Admitting: Internal Medicine

## 2014-10-09 NOTE — Progress Notes (Signed)
Quick Note:  Spoke with pt and notified of results per Dr. Wert. Pt verbalized understanding and denied any questions.  ______ 

## 2014-10-09 NOTE — Telephone Encounter (Signed)
Call pt: Reviewed cxr and no acute change so no change in recommendations made at [facility]

## 2014-10-09 NOTE — Telephone Encounter (Signed)
Pt returned call [phone]

## 2014-10-10 MED ORDER — ALBUTEROL SULFATE HFA 108 (90 BASE) MCG/ACT IN AERS: 1.0000 | INHALATION_SPRAY | Freq: Four times a day (QID) | RESPIRATORY_TRACT | Status: DC | PRN

## 2014-10-10 NOTE — Addendum Note (Signed)
Addended by: Virl Cagey on: 10/10/2014 05:29 PM   Modules accepted: Orders, Medications

## 2014-10-11 NOTE — Telephone Encounter (Signed)
lmomtcb x1 

## 2014-10-12 NOTE — Telephone Encounter (Signed)
Results have been explained to patient, pt expressed understanding. Nothing further needed.  

## 2014-11-03 ENCOUNTER — Ambulatory Visit (INDEPENDENT_AMBULATORY_CARE_PROVIDER_SITE_OTHER): Payer: 59 | Admitting: Internal Medicine

## 2014-11-03 ENCOUNTER — Encounter: Payer: Self-pay | Admitting: Internal Medicine

## 2014-11-03 VITALS — BP 120/80 | HR 104 | Ht 64.0 in | Wt 183.0 lb

## 2014-11-03 DIAGNOSIS — J4530 Mild persistent asthma, uncomplicated: Secondary | ICD-10-CM

## 2014-11-03 DIAGNOSIS — Z720 Tobacco use: Secondary | ICD-10-CM

## 2014-11-03 DIAGNOSIS — F17210 Nicotine dependence, cigarettes, uncomplicated: Secondary | ICD-10-CM

## 2014-11-03 MED ORDER — MOMETASONE FURO-FORMOTEROL FUM 100-5 MCG/ACT IN AERO: INHALATION_SPRAY | RESPIRATORY_TRACT | Status: DC

## 2014-11-03 NOTE — Patient Instructions (Addendum)
The key is to stop smoking completely before smoking completely stops you!   Ok to try zyrtec 10 mg daily as needed for drainage    If you are satisfied with your treatment plan,  let your doctor know and he/she can either refill your medications or you can return here when your prescription runs out.     If in any way you are not 100% satisfied,  please tell us.  If 100% better, tell your friends!  Pulmonary follow up is as needed    Remember to mention the calcium in R neck to Dr Nadyne Coombes

## 2014-11-03 NOTE — Assessment & Plan Note (Addendum)
-  spirometry 10/06/2014 FEV1  1.16 ( 59%) ratio 68 before dulera 100 2bid added   The proper method of use, as well as anticipated side effects, of a metered-dose inhaler are discussed and demonstrated to the patient. Improved effectiveness after extensive coaching during this visit to a level of approximately  75%   Despite still smoking/suboptimal hfa technicqu  all goals of chronic asthma control met including optimal function and elimination of symptoms with minimal need for rescue therapy.  Contingencies discussed in full including contacting this office immediately if not controlling the symptoms using the rule of two's.       Each maintenance medication was reviewed in detail including most importantly the difference between maintenance and as needed and under what circumstances the prns are to be used.  Please see instructions for details which were reviewed in writing and the patient given a copy.    Pulmonary f/u is prn

## 2014-11-03 NOTE — Progress Notes (Signed)
Subjective:    Patient ID: Tracie [name], female    DOB: [birth year]   MRN: [id number]   Brief patient profile:  78 yobf active smoker with asthma onset as child esp with colds worse by teenager then better but still with colds or exp to extreme heat  needed primatene then prn ventolin gets from [place] where free referred to pulmonary clinic on 10/06/2014 by Precious Haws with refractory chest tightness better with saba with minimal airflow obst on pfts 10/06/14    History of Present Illness  10/06/2014 1st [facility] office visit/ Wert   Chief Complaint  Patient presents with  . Pulmonary Consult    Referred by Dr. Precious Haws.  Pt c/o DOE and chest discomfort for the past 3 months. She also c/o cough with clear to tan sputum.   bad reaction to lisinopril with cough but off since  9/29/116 and rx prednisone but stil hoarse and cough productive of clear mucus.  Chest tightness some better and last dose prednisone Oct 5 Using saba up to every 4 hours including 2 h prior to OV   Cards eval [place] > increased amlodipine to 10 mg and f/u stress test in one week planned  rec Please remember to go to the   x-ray department downstairs for your tests - we will call you with the results when they are available. dulera 100 Take 2 puffs first thing in am and then another 2 puffs about 12 hours later.  Only use your albuterol (ventolin) as a rescue medication Work on inhaler technique:      11/03/2014 f/u ov/Wert re: active smoking/ asthma with min airflow obst   Chief Complaint  Patient presents with  . Follow-up    Pt states that her breathing is much improved since the last visit.  No new co's today. She has used rescue inhaler x 4 in the past wk.    mostly using saba when over does it, not noct  Some pnds/ mucus is white   Overdoes it =  Up inclines / no problem in big stores  Nl pace extended walking   No obvious day to day or daytime variabilty or assoc cp or chest tightness, subjective  wheeze overt sinus or hb symptoms. No unusual exp hx or h/o childhood pna/ asthma or knowledge of premature birth.  Sleeping ok without nocturnal  or early am exacerbation  of respiratory  c/o's or need for noct saba. Also denies any obvious fluctuation of symptoms with weather or environmental changes or other aggravating or alleviating factors except as outlined above   Current Medications, Allergies, Complete Past Medical History, Past Surgical History, Family History, and Social History were reviewed in [employer] record.  ROS  The following are not active complaints unless bolded sore throat, dysphagia, dental problems, itching, sneezing,  nasal congestion or excess/ purulent secretions, ear ache,   fever, chills, sweats, unintended wt loss, pleuritic or exertional cp, hemoptysis,  orthopnea pnd or leg swelling, presyncope, palpitations, heartburn, abdominal pain, anorexia, nausea, vomiting, diarrhea  or change in bowel or urinary habits, change in stools or urine, dysuria,hematuria,  rash, arthralgias, visual complaints, headache, numbness weakness or ataxia or problems with walking or coordination,  change in mood/affect or memory.                       Objective:   Physical Exam   amb bf  nad  11/03/2014   183  Wt Readings from Last 3 Encounters:  10/06/14 182 lb (82.555 kg)  11/28/11 168 lb 12.8 oz (76.567 kg)  10/31/11 169 lb 6.4 oz (76.839 kg)      HEENT: top dentures o/w nl dentition, turbinates, and orophanx. Nl external ear canals without cough reflex   NECK :  without JVD/Nodes/TM/ nl carotid upstrokes bilaterally   LUNGS: no acc muscle use, clear to A and P bilaterally without cough on insp or exp maneuvers   CV:  RRR  no s3 or murmur or increase in P2, no edema   ABD:  soft and nontender with  Late insp hoover's  in the supine position. No bruits or organomegaly, bowel sounds nl  MS:  warm without deformities, calf tenderness,  cyanosis or clubbing  SKIN: warm and dry without lesions    NEURO:  alert, approp, no deficits     CXR  10/06/2014 :  1. New, mild cardiac enlargement.  2. No acute airspace disease.      Assessment & Plan:

## 2014-11-03 NOTE — Assessment & Plan Note (Signed)

## 2015-03-01 ENCOUNTER — Other Ambulatory Visit: Payer: Self-pay | Admitting: Internal Medicine

## 2015-04-01 ENCOUNTER — Other Ambulatory Visit: Payer: Self-pay | Admitting: Internal Medicine

## 2015-04-02 ENCOUNTER — Other Ambulatory Visit: Payer: Self-pay | Admitting: Internal Medicine

## 2015-04-03 ENCOUNTER — Other Ambulatory Visit: Payer: Self-pay | Admitting: Internal Medicine

## 2015-04-04 ENCOUNTER — Other Ambulatory Visit: Payer: Self-pay | Admitting: *Deleted

## 2015-04-04 MED ORDER — MOMETASONE FURO-FORMOTEROL FUM 100-5 MCG/ACT IN AERO
2.0000 | INHALATION_SPRAY | Freq: Two times a day (BID) | RESPIRATORY_TRACT | Status: DC
Start: 2015-04-04 — End: 2015-04-11

## 2015-04-11 ENCOUNTER — Telehealth: Payer: Self-pay | Admitting: Internal Medicine

## 2015-04-11 MED ORDER — MOMETASONE FURO-FORMOTEROL FUM 100-5 MCG/ACT IN AERO: 2.0000 | INHALATION_SPRAY | Freq: Two times a day (BID) | RESPIRATORY_TRACT | Status: DC

## 2015-04-11 NOTE — Telephone Encounter (Signed)
Rx sent to [facility].  Patient notified via voicemail. Nothing further needed.

## 2015-04-12 ENCOUNTER — Telehealth: Payer: Self-pay | Admitting: Internal Medicine

## 2015-04-12 MED ORDER — MOMETASONE FURO-FORMOTEROL FUM 100-5 MCG/ACT IN AERO: 2.0000 | INHALATION_SPRAY | Freq: Two times a day (BID) | RESPIRATORY_TRACT | Status: DC

## 2015-04-12 NOTE — Telephone Encounter (Signed)
Spoke with pt, states that insurance processed dulera rx backwards, having pt pay 80% and ins covering 20%.  New rx was sent to [facility] for a 90 day rx.  Pt asked if we had dulera samples as she will be going over 2 weeks without her med.  I advised we did not but offerred to send a single inhaler to local pharmacy while we waited on [employer] rx to mail prescriptions, which pt was ok with.  This has been sent.  Nothing further needed.

## 2015-11-26 ENCOUNTER — Other Ambulatory Visit: Payer: Self-pay | Admitting: Internal Medicine

## 2016-01-04 ENCOUNTER — Telehealth: Payer: Self-pay | Admitting: Internal Medicine

## 2016-01-04 MED ORDER — MOMETASONE FURO-FORMOTEROL FUM 100-5 MCG/ACT IN AERO: 2.0000 | INHALATION_SPRAY | Freq: Two times a day (BID) | RESPIRATORY_TRACT | Status: DC

## 2016-01-04 NOTE — Telephone Encounter (Signed)
lmtcb x1 for pt. Per MW, pulmonary follow up is prn. Either needs OV or have PCP refill rx.

## 2016-01-04 NOTE — Telephone Encounter (Signed)
Spoke with pt. She wants to schedule an appointment with MW. This has been scheduled for 02/01/16 at 8:45am. Rx has been sent in. Nothing further was needed.

## 2016-01-04 NOTE — Telephone Encounter (Signed)
[phone] calling back

## 2016-02-01 ENCOUNTER — Ambulatory Visit: Payer: Self-pay | Admitting: Internal Medicine

## 2017-09-03 ENCOUNTER — Other Ambulatory Visit: Payer: Self-pay | Admitting: Radiology

## 2017-09-04 ENCOUNTER — Telehealth: Payer: Self-pay | Admitting: Hematology and Oncology

## 2017-09-04 NOTE — Telephone Encounter (Signed)
LVM for patient to return call in reference to upcoming Breast Clinic appointment

## 2017-09-07 ENCOUNTER — Encounter: Payer: Self-pay | Admitting: *Deleted

## 2017-09-07 ENCOUNTER — Telehealth: Payer: Self-pay | Admitting: Hematology and Oncology

## 2017-09-07 NOTE — Telephone Encounter (Signed)
Patient called to confirm upcoming appointment for Wednesday, no further was needed

## 2017-09-08 ENCOUNTER — Other Ambulatory Visit: Payer: Self-pay | Admitting: *Deleted

## 2017-09-08 DIAGNOSIS — C50411 Malignant neoplasm of upper-outer quadrant of right female breast: Secondary | ICD-10-CM | POA: Insufficient documentation

## 2017-09-08 DIAGNOSIS — Z170 Estrogen receptor positive status [ER+]: Principal | ICD-10-CM

## 2017-09-09 ENCOUNTER — Ambulatory Visit
Admission: RE | Admit: 2017-09-09 | Discharge: 2017-09-09 | Disposition: A | Discharge status: Home / Self Care | Payer: Medicare Other | Source: Ambulatory Visit | Attending: Radiation Oncology | Admitting: Radiation Oncology

## 2017-09-09 ENCOUNTER — Inpatient Hospital Stay (HOSPITAL_BASED_OUTPATIENT_CLINIC_OR_DEPARTMENT_OTHER): Payer: 59

## 2017-09-09 ENCOUNTER — Other Ambulatory Visit: Payer: Self-pay | Admitting: *Deleted

## 2017-09-09 ENCOUNTER — Other Ambulatory Visit: Payer: Self-pay | Admitting: General Surgery

## 2017-09-09 ENCOUNTER — Encounter: Payer: Self-pay | Admitting: *Deleted

## 2017-09-09 ENCOUNTER — Ambulatory Visit: Payer: 59 | Attending: General Surgery | Admitting: Physical Therapy

## 2017-09-09 ENCOUNTER — Encounter: Payer: Self-pay | Admitting: Physical Therapy

## 2017-09-09 ENCOUNTER — Encounter: Payer: Self-pay | Admitting: Genetics

## 2017-09-09 ENCOUNTER — Ambulatory Visit (HOSPITAL_BASED_OUTPATIENT_CLINIC_OR_DEPARTMENT_OTHER): Payer: Medicare Other | Admitting: Oncology

## 2017-09-09 VITALS — BP 170/86 | HR 86 | Temp 97.8°F | Resp 18 | Ht 64.0 in | Wt 187.0 lb

## 2017-09-09 DIAGNOSIS — M25511 Pain in right shoulder: Secondary | ICD-10-CM | POA: Insufficient documentation

## 2017-09-09 DIAGNOSIS — G8929 Other chronic pain: Secondary | ICD-10-CM

## 2017-09-09 DIAGNOSIS — Z170 Estrogen receptor positive status [ER+]: Principal | ICD-10-CM

## 2017-09-09 DIAGNOSIS — C50411 Malignant neoplasm of upper-outer quadrant of right female breast: Secondary | ICD-10-CM | POA: Insufficient documentation

## 2017-09-09 DIAGNOSIS — R293 Abnormal posture: Secondary | ICD-10-CM

## 2017-09-09 DIAGNOSIS — C773 Secondary and unspecified malignant neoplasm of axilla and upper limb lymph nodes: Secondary | ICD-10-CM | POA: Diagnosis not present

## 2017-09-09 LAB — COMPREHENSIVE METABOLIC PANEL
ALBUMIN: 3.6 g/dL (ref 3.5–5.0)
ALK PHOS: 124 U/L (ref 40–150)
ALT: 15 U/L (ref 0–55)
AST: 15 U/L (ref 5–34)
Anion Gap: 10 mEq/L (ref 3–11)
BUN: 13.4 mg/dL (ref 7.0–26.0)
CALCIUM: 9.9 mg/dL (ref 8.4–10.4)
CO2: 26 mEq/L (ref 22–29)
Chloride: 102 mEq/L (ref 98–109)
Creatinine: 0.9 mg/dL (ref 0.6–1.1)
EGFR: 72 mL/min/{1.73_m2} — AB (ref >90)
GLUCOSE: 106 mg/dL (ref 70–140)
Potassium: 3.8 mEq/L (ref 3.5–5.1)
SODIUM: 138 meq/L (ref 136–145)
TOTAL PROTEIN: 7.4 g/dL (ref 6.4–8.3)

## 2017-09-09 LAB — CBC WITH DIFFERENTIAL/PLATELET
BASO%: 1.1 % (ref 0.0–2.0)
Basophils Absolute: 0.1 10*3/uL (ref 0.0–0.1)
EOS ABS: 0.2 10*3/uL (ref 0.0–0.5)
EOS%: 4 % (ref 0.0–7.0)
HEMATOCRIT: 38.6 % (ref 34.8–46.6)
HEMOGLOBIN: 12.9 g/dL (ref 11.6–15.9)
LYMPH%: 35.7 % (ref 14.0–49.7)
MCH: 28.7 pg (ref 25.1–34.0)
MCHC: 33.3 g/dL (ref 31.5–36.0)
MCV: 86.3 fL (ref 79.5–101.0)
MONO#: 0.6 10*3/uL (ref 0.1–0.9)
MONO%: 10 % (ref 0.0–14.0)
NEUT%: 49.2 % (ref 38.4–76.8)
NEUTROS ABS: 2.9 10*3/uL (ref 1.5–6.5)
Platelets: 338 10*3/uL (ref 145–400)
RBC: 4.47 10*6/uL (ref 3.70–5.45)
RDW: 17.3 % — ABNORMAL HIGH (ref 11.2–14.5)
WBC: 5.9 10*3/uL (ref 3.9–10.3)
lymph#: 2.1 10*3/uL (ref 0.9–3.3)

## 2017-09-09 MED ORDER — LORAZEPAM 0.5 MG PO TABS: 0.5000 mg | ORAL_TABLET | Freq: Every evening | ORAL | 0 refills | Status: DC | PRN

## 2017-09-09 MED ORDER — LIDOCAINE-PRILOCAINE 2.5-2.5 % EX CREA: TOPICAL_CREAM | CUTANEOUS | 3 refills | Status: DC

## 2017-09-09 MED ORDER — DEXAMETHASONE 4 MG PO TABS: 8.0000 mg | ORAL_TABLET | Freq: Two times a day (BID) | ORAL | 1 refills | Status: DC

## 2017-09-09 MED ORDER — PROCHLORPERAZINE MALEATE 10 MG PO TABS: 10.0000 mg | ORAL_TABLET | Freq: Four times a day (QID) | ORAL | 1 refills | Status: DC | PRN

## 2017-09-09 NOTE — Progress Notes (Signed)
Radiation Oncology         [phone] ________________________________  Initial outpatient Consultation  Name: Tracie [name] MRN: [id number]  Date: 09/09/2017  DOB: December 03, 1951  QP:YPPJ, Dola Factor, MD  Tracie Skates, MD   REFERRING PHYSICIAN: [name], MD  DIAGNOSIS:    ICD-10-CM   1. Malignant neoplasm of upper-outer quadrant of right breast in female, estrogen receptor positive ([place]) C50.411    Z17.0   Cancer Staging Malignant neoplasm of upper-outer quadrant of right breast in female, estrogen receptor positive ([place]) Staging form: Breast, AJCC 8th Edition - Clinical stage from 09/09/2017: Stage IIB (cT2, cN1, cM0, G3, ER: Positive, PR: Negative, HER2: Positive) - Unsigned Staging comments: Staged at breast  Conference 9.12.18   Stage IIB Right Breast UOQ Invasive Ductal Carcinoma, ER weakly+ / PRneg / Her2+ (in node but not mass), Grade 3  CHIEF COMPLAINT: Here to discuss management of right breast cancer  HISTORY OF PRESENT ILLNESS::Tracie [name] is a 66 y.o. female who presented with a palpable right breast mass, pain, and dimpling of the breast.   Mammography showed an UOQ right breast mass and suspicious LN .  On [place] the mass was 2.1cm, and 2 abnl LNs were found on [place] in the right axilla. Biopsy of mass showed Invasive Ductal Carcinoma, ER70% weakly+ / PRneg / Her2- (in mass), Grade 3; node biopsy showed carcinoma that was ER 75% weakly+, PR -, HER2+.   She reports smoking 1/2ppd.  She reports baseline sinus issues, dentures, asthma, back pain, thyroid issues.   PREVIOUS RADIATION THERAPY: no  PAST MEDICAL HISTORY:  has a past medical history of Angina pectoris ([place]); Asthma; Constipation; Diverticulitis; HPV in female; Hyperlipidemia; and Hypertension.    PAST SURGICAL HISTORY: Past Surgical History:  Procedure Laterality Date  . CESAREAN SECTION    . COLON SURGERY  2012   sigmoidectomy  . COLOSTOMY    . COLOSTOMY TAKEDOWN  10/06/11  .  TONSILLECTOMY    . TUBAL LIGATION      FAMILY HISTORY: family history includes Asthma in her brother and son; Cancer in her mother; Hypertension in her brother and brother.  SOCIAL HISTORY:  reports that she has been smoking Cigarettes.  She has a 20.00 pack-year smoking history. She has never used smokeless tobacco. She reports that she drinks alcohol. She reports that she does not use drugs.  ALLERGIES: Fish allergy; Peanut-containing drug products; and Ace inhibitors  MEDICATIONS:  Current Outpatient Prescriptions  Medication Sig Dispense Refill  . albuterol (VENTOLIN HFA) 108 (90 BASE) MCG/ACT inhaler Inhale 1-2 puffs into the lungs every 6 (six) hours as needed for wheezing or shortness of breath. 1 Inhaler 6  . Alirocumab (PRALUENT) 75 MG/ML SOPN Inject into the muscle.    Marland Kitchen amLODipine (NORVASC) 10 MG tablet daily.    . cetirizine (ZYRTEC) 10 MG tablet Take 10 mg by mouth as needed.    Marland Kitchen dexamethasone (DECADRON) 4 MG tablet Take 2 tablets (8 mg total) by mouth 2 (two) times daily. Start the day before Taxotere. Then again the day after chemo for 3 days. 30 tablet 1  . levothyroxine (SYNTHROID, LEVOTHROID) 50 MCG tablet Take 50 mcg by mouth daily.    Marland Kitchen lidocaine-prilocaine (EMLA) cream Apply to affected area once 30 g 3  . LORazepam (ATIVAN) 0.5 MG tablet Take 1 tablet (0.5 mg total) by mouth at bedtime as needed (Nausea or vomiting). 30 tablet 0  . losartan-hydrochlorothiazide (HYZAAR) 100-12.5 MG tablet daily.  3  .  metoprolol succinate (TOPROL-XL) 50 MG 24 hr tablet daily.  6  . mometasone-formoterol (DULERA) 100-5 MCG/ACT AERO Inhale 2 puffs into the lungs 2 (two) times daily. 3 Inhaler 0  . prochlorperazine (COMPAZINE) 10 MG tablet Take 1 tablet (10 mg total) by mouth every 6 (six) hours as needed (Nausea or vomiting). 30 tablet 1  . valsartan-hydrochlorothiazide (DIOVAN-HCT) 160-25 MG per tablet Take 1 tablet by mouth daily.  2   No current facility-administered medications for  this encounter.     REVIEW OF SYSTEMS: A 10+ POINT REVIEW OF SYSTEMS WAS OBTAINED including neurology, dermatology, psychiatry, cardiac, respiratory, lymph, extremities, GI, GU, Musculoskeletal, constitutional, breasts, reproductive, HEENT.  All pertinent positives are noted in the HPI.  All others are negative.   PHYSICAL EXAM:   Vitals with Age-Percentiles 09/09/2017  Length 641.5 cm  Systolic 830  Diastolic 86  Pulse 86  Respiration 18  Weight 84.823 kg  BMI 32.08  VISIT REPORT    General: Alert and oriented, in no acute distress HEENT: Head is normocephalic. Extraocular movements are intact. Oropharynx is clear. Neck: Neck is supple, no palpable cervical or supraclavicular lymphadenopathy. Heart: Regular in rate and rhythm  Chest: Clear to auscultation bilaterally, with no rhonchi, wheezes, or rales. Abdomen: Soft, nontender, nondistended, with no rigidity or guarding. Extremities: No UE edema. Lymphatics: see Neck Exam Skin: No concerning lesions. Musculoskeletal: symmetric strength and muscle tone throughout. Neurologic: Cranial nerves II through XII are grossly intact. No obvious focalities. Speech is fluent. Coordination is intact. Psychiatric: Judgment and insight are intact. Affect is appropriate. Breasts: palpable right UOQ mass with post biopsy changes No other palpable masses appreciated in the breasts or axillae bilaterally .  ECOG = 0  0 - Asymptomatic (Fully active, able to carry on all predisease activities without restriction)  1 - Symptomatic but completely ambulatory (Restricted in physically strenuous activity but ambulatory and able to carry out work of a light or sedentary nature. For example, light housework, office work)  2 - Symptomatic, <50% in bed during the day (Ambulatory and capable of all self care but unable to carry out any work activities. Up and about more than 50% of waking hours)  3 - Symptomatic, >50% in bed, but not bedbound (Capable of  only limited self-care, confined to bed or chair 50% or more of waking hours)  4 - Bedbound (Completely disabled. Cannot carry on any self-care. Totally confined to bed or chair)  5 - Death   Eustace Pen MM, Creech RH, Tormey DC, et al. [phone]). "Toxicity and response criteria of the [facility]". [place] Oncol. 5 (6): 649-55   LABORATORY DATA:  Lab Results  Component Value Date   WBC 5.9 09/09/2017   HGB 12.9 09/09/2017   HCT 38.6 09/09/2017   MCV 86.3 09/09/2017   PLT 338 09/09/2017   CMP     Component Value Date/Time   NA 138 09/09/2017 0830   K 3.8 09/09/2017 0830   CL 99 10/09/2011 0611   CO2 26 09/09/2017 0830   GLUCOSE 106 09/09/2017 0830   BUN 13.4 09/09/2017 0830   CREATININE 0.9 09/09/2017 0830   CALCIUM 9.9 09/09/2017 0830   PROT 7.4 09/09/2017 0830   ALBUMIN 3.6 09/09/2017 0830   AST 15 09/09/2017 0830   ALT 15 09/09/2017 0830   ALKPHOS 124 09/09/2017 0830   BILITOT <0.22 09/09/2017 0830   GFRNONAA >90 10/09/2011 0611   GFRAA >90 10/09/2011 6808  RADIOGRAPHY:  As above    IMPRESSION/PLAN: right breast cancer  She has been discussed at our multidisciplinary tumor board.  The consensus is that she would be a good candidate for breast conservation. I talked to her about the option of a mastectomy and informed her that her expected overall survival would be equivalent between mastectomy and breast conservation, based upon randomized controlled data. She is enthusiastic about breast conservation.  Plan for MRI of breasts, and staging scans, first.  It was a pleasure meeting the patient today. We discussed the risks, benefits, and side effects of radiotherapy. I recommend radiotherapy to the right breast and nodes to reduce her risk of locoregional recurrence by 2/3.  We discussed that radiation would take approximately 6 weeks to complete and that I would give the patient a few weeks to heal following surgery before starting treatment  planning. If chemotherapy were to be given (anticipated), this would be completed before radiotherapy. We spoke about acute effects including skin irritation and fatigue as well as much less common late effects including internal organ injury or irritation. We spoke about the latest technology that is used to minimize the risk of late effects for patients undergoing radiotherapy to the breast or chest wall. No guarantees of treatment were given. The patient is enthusiastic about proceeding with treatment. I look forward to participating in the patient's care.  I will await her referral back to me for postoperative follow-up and eventual CT simulation/treatment planning.     __________________________________________   [name], MD

## 2017-09-09 NOTE — Progress Notes (Signed)
Nutrition Assessment  Reason for Assessment:  Pt seen in Breast Clinic  ASSESSMENT:   66 year old female with new diagnosis of breast cancer.  Past medical history of HLD, HTN, asthma, angina pectoris, colostomy with reversal.  Patient reports normal appetite.  Medications:  reviewed  Labs: reviewed  Anthropometrics:   Height: 64 inches Weight: 187 lb BMI: 32   NUTRITION DIAGNOSIS: Food and nutrition related knowledge deficit related to new diagnosis of breast cancer as evidenced by no prior need for nutrition related information.  INTERVENTION:   Discussed and provided packet of information regarding nutritional tips for breast cancer patients.  Questions answered.  Teachback method used.  Contact information provided and patient knows to contact me with questions/concerns.    MONITORING, EVALUATION, and GOAL: Pt will consume a healthy plant based diet to maintain lean body mass throughout treatment.   Deland Slocumb B. Zenia Resides, [place], [place] Registered Dietitian [phone] (pager)

## 2017-09-09 NOTE — Progress Notes (Signed)
Clinical Social Work [place] Psychosocial Distress Screening [place]  Patient completed distress screening protocol and scored a 3 on the Psychosocial Distress Thermometer which indicates mild distress. Clinical Social Worker met with patient in [facility] to assess for distress and other psychosocial needs. Patient stated she was feeling overwhelmed but felt "comfortable" with the treatment team and getting more information on her treatment plan. CSW and patient discussed common feeling and emotions when being diagnosed with cancer, and the importance of support during treatment. CSW informed patient of the support team and support services at [facility]. CSW provided contact information and encouraged patient to call with any questions or concerns.  ONCBCN DISTRESS SCREENING 09/09/2017  Screening Type Initial Screening  Distress experienced in past week (1-10) 3  Emotional problem type Adjusting to illness  Information Concerns Type Lack of info about treatment  Physical Problem type Pain  Physician notified of physical symptoms Yes     Johnnye Lana, MSW, LCSW, OSW-C Clinical Social Worker [place] [phone]

## 2017-09-09 NOTE — Therapy (Addendum)
[place], [place], [address] Phone: [phone]   Fax:  [phone]  Physical Therapy Evaluation  Patient Details  Name: Tracie [name] MRN: [id number] Date of Birth: 1951-03-03 Referring Provider: Dr. [name]  Encounter Date: 09/09/2017      PT End of Session - 09/09/17 1217    Visit Number 1   Number of Visits 2   PT Start Time 1100   PT Stop Time 1135   PT Time Calculation (min) 35 min   Activity Tolerance Patient tolerated treatment well   Behavior During Therapy [facility] for tasks assessed/performed      Past Medical History:  Diagnosis Date  . Angina pectoris ([place])   . Asthma   . Constipation   . Diverticulitis   . HPV in female   . Hyperlipidemia   . Hypertension     Past Surgical History:  Procedure Laterality Date  . CESAREAN SECTION    . COLON SURGERY  2012   sigmoidectomy  . COLOSTOMY    . COLOSTOMY TAKEDOWN  10/06/11  . TONSILLECTOMY    . TUBAL LIGATION      There were no vitals filed for this visit.       Subjective Assessment - 09/09/17 1207    Subjective Patient reports she is here today to be seen by her medical team for her newly diagnosed right breast cancer.   Pertinent History Patient was diagnosed on 09/03/17 with right grade 3 invasive ductal carcinoma breast cancer. It measures 2.1 cm and is located in the upper outer quadrant. It is ER positive, PR negative, HER2 positive (positive on the axillary node but negative in the breast mass), with a Ki67 of 90%. There were 2 abnormal appearing nodes in her axilla with ultrasound. One was biopsied and found to be positive.   Patient Stated Goals Reduce lymphedema risk and learn post op shoulder ROM HEP   Currently in Pain? Yes   Pain Score 5    Pain Location Scapula   Pain Orientation Right   Pain Descriptors / Indicators Aching   Pain Type Chronic pain   Pain Onset More than a month ago   Pain Frequency  Intermittent   Aggravating Factors  night time   Pain Relieving Factors Unknown            [facility] PT Assessment - 09/09/17 0001      Assessment   Medical Diagnosis Right breast cancer   Referring Provider Dr. [name]   Onset Date/Surgical Date 09/03/17   Hand Dominance Right   Prior Therapy none     Precautions   Precautions Other (comment)   Precaution Comments Active cancer     Restrictions   Weight Bearing Restrictions No     Balance Screen   Has the patient fallen in the past 6 months No   Has the patient had a decrease in activity level because of a fear of falling?  No   Is the patient reluctant to leave their home because of a fear of falling?  No     Home Environment   Living Environment Private residence   Living Arrangements Children  Sometimes her 32 y.o. 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, art   Leisure She does not  exercise     Cognition   Overall Cognitive Status Within Functional Limits for tasks assessed     Posture/Postural Control   Posture/Postural Control Postural limitations   Postural Limitations Rounded Shoulders;Forward head  Scapulae appear protracted     ROM / Strength   AROM / PROM / Strength AROM;Strength     AROM   AROM Assessment Site Shoulder;Cervical   Right/Left Shoulder Right;Left   Right Shoulder Extension 55 Degrees   Right Shoulder Flexion 165 Degrees   Right Shoulder ABduction 165 Degrees   Right Shoulder Internal Rotation 72 Degrees   Right Shoulder External Rotation 70 Degrees   Left Shoulder Extension 55 Degrees   Left Shoulder Flexion 157 Degrees   Left Shoulder ABduction 155 Degrees   Left Shoulder Internal Rotation 73 Degrees   Left Shoulder External Rotation 60 Degrees   Cervical Flexion WNL   Cervical Extension WNL   Cervical -  Right Side Bend WNL   Cervical - Left Side Bend WNL   Cervical - Right Rotation WNL   Cervical - Left Rotation WNL     Strength   Overall Strength Within functional limits for tasks performed           LYMPHEDEMA/ONCOLOGY QUESTIONNAIRE - 09/09/17 1216      Type   Cancer Type Right breast cancer     Lymphedema Assessments   Lymphedema Assessments Upper extremities     Right Upper Extremity Lymphedema   10 cm Proximal to Olecranon Process 31.1 cm   Olecranon Process 24.9 cm   10 cm Proximal to Ulnar Styloid Process 22.5 cm   Just Proximal to Ulnar Styloid Process 15.6 cm   Across Hand at [employer] 18.5 cm   At [place] of 2nd Digit 6.5 cm     Left Upper Extremity Lymphedema   10 cm Proximal to Olecranon Process 30.3 cm   Olecranon Process 24.9 cm   10 cm Proximal to Ulnar Styloid Process 21.4 cm   Just Proximal to Ulnar Styloid Process 15.3 cm   Across Hand at [employer] 18 cm   At [place] of 2nd Digit 6.2 cm         Objective measurements completed on examination: See above findings.     Patient was instructed today in a home exercise program today for post op shoulder range of motion. These included active assist shoulder flexion in sitting, scapular retraction, wall walking with shoulder abduction, and hands behind head external rotation.  She was encouraged to do these twice a day, holding 3 seconds and repeating 5 times when permitted by her physician.         PT Education - 09/09/17 1217    Education provided Yes   Education Details Lymphedema risk reduction and post op shoulder ROM HEP   Person(s) Educated Patient   Methods Explanation;Demonstration;Handout   Comprehension Returned demonstration;Verbalized understanding              Breast Clinic Goals - 09/09/17 1222      Patient will be able to verbalize understanding of pertinent lymphedema risk reduction practices relevant to her diagnosis specifically related to skin care.   Time 1    Period Days   Status Achieved     Patient will be able to return demonstrate and/or verbalize understanding of the post-op home exercise program related to regaining shoulder range of motion.   Time 1   Period Days   Status Achieved     Patient will  be able to verbalize understanding of the importance of attending the postoperative After Breast Cancer Class for further lymphedema risk reduction education and therapeutic exercise.   Time 1   Period Days   Status Achieved               Plan - 09/09/17 1217    Clinical Impression Statement Patient was diagnosed on 09/03/17 with right grade 3 invasive ductal carcinoma breast cancer. It measures 2.1 cm and is located in the upper outer quadrant. It is ER positive, PR negative, HER2 positive (positive on the axillary node but negative in the breast mass), with a Ki67 of 90%. There were 2 abnormal appearing nodes in her axilla with ultrasound. One was biopsied and found to be positive. Her multidisciplinary medical team met prior to her assessments to determine a recommended treatment plan. She is planning to have an MRI and staging scans, neoadjuvant chemotherapy, a right lumpectomy and targeted lymph node dissection, radiation, and anti-estrogen therapy. She will benefit from post op PT to regain shoulder ROM and reduce lymphedema risk.   History and Personal Factors relevant to plan of care: Lives alone much of the time; has pre-existing right scapular pain with tightness that may interfere with quick recovery with right shoulder ROM   Clinical Presentation Evolving   Clinical Presentation due to: unknown extent of disease until staging scans results return   Clinical Decision Making Moderate   Rehab Potential Excellent   Clinical Impairments Affecting Rehab Potential Pre-existing rhomboid issue   PT Frequency --  1 f/u visit 3 weeks post op and then determine POC   PT Treatment/Interventions Patient/family education;Therapeutic exercise    PT Next Visit Plan Will f/u 3 weeks post op to determine PT needs   PT Home Exercise Plan Shoulder ROM HEP   Consulted and Agree with Plan of Care Patient      Patient will benefit from skilled therapeutic intervention in order to improve the following deficits and impairments:  Postural dysfunction, Decreased knowledge of precautions, Pain, Impaired UE functional use, Decreased range of motion  Visit Diagnosis: Carcinoma of upper-outer quadrant of right breast in female, estrogen receptor positive ([place]) - Plan: PT plan of care cert/re-cert  Abnormal posture - Plan: PT plan of care cert/re-cert  Chronic right shoulder pain - Plan: PT plan of care cert/re-cert      G-Codes - 35/59/74 1223    Functional Assessment Tool Used (Outpatient Only) Clinical Judgement   Functional Limitation Other PT primary   Other PT Primary Current Status (B6384) At least 20 percent but less than 40 percent impaired, limited or restricted   Other PT Primary Goal Status (T3646) At least 1 percent but less than 20 percent impaired, limited or restricted     Patient will follow up at outpatient cancer rehab if needed following surgery.  If the patient requires physical therapy at that time, a specific plan will be dictated and sent to the referring physician for approval. The patient was educated today on appropriate basic range of motion exercises to begin post operatively and the importance of attending the After Breast Cancer class following surgery.  Patient was educated today on lymphedema risk reduction practices as it pertains to recommendations that will benefit the patient immediately following surgery.  She verbalized good understanding.  No additional physical therapy is indicated at this time.     Problem List Patient Active Problem List   Diagnosis Date Noted  . Malignant neoplasm of upper-outer quadrant of right  breast in female, estrogen receptor positive ([place]) 09/08/2017  . Asthma, chronic  10/06/2014  . Cigarette smoker 10/06/2014  . Diverticulitis of colon with perforation, sigmoid colectomy/ostomy 02/11/2011, ostomy takedown 10/06/2011 08/04/2011  . Benign essential HTN 08/04/2011   Annia Friendly, PT 09/09/17 12:25 PM  [place], [place], [address] Phone: [phone]   Fax:  [phone]  Name: Tracie [name] MRN: [id number] Date of Birth: December 28, 1951  PHYSICAL THERAPY DISCHARGE SUMMARY  Visits from Start of Care: 1  Current functional level related to goals / functional outcomes: Unknown; pt did not return after surgery   Remaining deficits: Unknown   Education / Equipment: Post op HEP  Plan: Patient agrees to discharge.  Patient goals were not met. Patient is being discharged due to not returning since the last visit.  ?????         Annia Friendly, [place] 01/25/18 4:37 PM

## 2017-09-09 NOTE — Patient Instructions (Signed)

## 2017-09-09 NOTE — Progress Notes (Signed)
START ON PATHWAY REGIMEN - Breast     A cycle is every 21 days:     Pertuzumab      Pertuzumab      Trastuzumab      Trastuzumab      Carboplatin      Docetaxel   **Always confirm dose/schedule in your pharmacy ordering system**  Patient Characteristics: Preoperative or Nonsurgical Candidate (Clinical Staging), Neoadjuvant Therapy followed by Surgery, Invasive Disease, Chemotherapy, HER2 Positive, ER Positive Therapeutic Status: Preoperative or Nonsurgical Candidate (Clinical Staging) AJCC M Category: cM0 AJCC Grade: G3 Breast Surgical Plan: Neoadjuvant Therapy followed by Surgery ER Status: Positive (+) AJCC 8 Stage Grouping: IIB HER2 Status: Positive (+) AJCC T Category: cT2 AJCC N Category: cN1 PR Status: Negative (-) Intent of Therapy: Curative Intent, Discussed with Patient 

## 2017-09-09 NOTE — Progress Notes (Signed)
[place]  Telephone:(336) [phone] Fax:(336) [phone]     ID: MIKELLA LINSLEY DOB: December 02, 1951  MR#: [id number]  WLS#:[id number]  Patient Care Team: Bartholome Bill, MD as PCP - General (Family Medicine) [name], MD as Consulting Physician (General Surgery) Kailea Dannemiller, [name], MD as Consulting Physician (Oncology) [name], MD as Attending Physician (Radiation Oncology) Chauncey Cruel, MD OTHER MD:  CHIEF COMPLAINT: Estrogen receptor positive breast cancer  CURRENT TREATMENT: Neoadjuvant chemotherapy/immunotherapy.   HISTORY OF CURRENT ILLNESS: [name] noted a change in her right breast sometime in July 2018. She called the [facility] to report that she had found a "kernel" in her breast. She was advised to see her primary physician which she did. The patient was then set up for bilateral diagnostic mammography with tomography and right breast ultrasonography at [facility] 09/03/2017. The breast density was category I a. In the upper outer quadrant of the right breast there was a 2.1 cm high density mass, which was palpable. Also in the right breast more posteriorly there was a 1.5 cm high density mass which was felt to be a suspicious lymph node. Ultrasound of the right breast confirmed a 2.1 centimeter lobulated solid mass in the right breast upper outer quadrant 15.8 cm from the nipple in the 10:00 radiant. There was a 1.5 cm oval mass in the right axillary tail with a small rounded masses also suspicious for lymph node involvement. A total of 3 suspicious lymph nodes were identified.  On 09/03/2017 the patient underwent biopsy of the breast mass and the suspicious right axillary lymph node. The final pathology ([id number]) found invasive ductal carcinoma, grade 3, in both. Both tumors were estrogen receptor positive at 70-75%, and both were progesterone receptor negative. Both had an elevated proliferation marker at 90%. The mass in the breast was HER-2  negative, with a signals ratio of 1.25 and the number per cell 1.88. The lymph node mass however was HER-2 positive with a signals ratio of 2.57, and the number per cell 3.60.  The patient's subsequent history is as detailed below.  INTERVAL HISTORY: [name] was evaluated in the multidisciplinary breast cancer clinic 09/09/2017. Her case was also presented at the multidisciplinary breast cancer conference on the same day. At that time a preliminary plan was proposed: Neoadjuvant chemotherapy and immunotherapy, likely followed by lumpectomy and targeted axillary dissection. Staging was suggested. She would need adjuvant radiation and then adjuvant anti-estrogens  REVIEW OF SYSTEMS: Aside from the mass itself, there were no specific symptoms leading to the diagnostic mammogram. The patient has a history of stable angina, which is currently not active. She has mild sinus problems. She has mild asthma. She has chronic low back pain. This is not more intense or persistent than before. The patient denies unusual headaches, visual changes, nausea, vomiting, stiff neck, dizziness, or gait imbalance. There has been no cough, phlegm production, or pleurisy, and no change in bowel or bladder habits. The patient denies fever, rash, bleeding, unexplained fatigue or unexplained weight loss. A detailed review of systems was otherwise entirely negative.     PAST MEDICAL HISTORY: Past Medical History:  Diagnosis Date  . Angina pectoris ([place])   . Asthma   . Constipation   . Diverticulitis   . HPV in female   . Hyperlipidemia   . Hypertension     PAST SURGICAL HISTORY: Past Surgical History:  Procedure Laterality Date  . CESAREAN SECTION    . COLON SURGERY  2012   sigmoidectomy  .  COLOSTOMY    . COLOSTOMY TAKEDOWN  10/06/11  . TONSILLECTOMY    . TUBAL LIGATION      FAMILY HISTORY Family History  Problem Relation Age of Onset  . Cancer Mother        rectal  . Hypertension Brother   . Hypertension  Brother   . Asthma Son   . Asthma Brother   The patient has little information regarding her father. Her mother died at age 52 from a strange cancer--the patient does not know what it was, it may well have been cervical cancer from her description. The patient has one brother, no sisters. There is no history of breast or ovarian cancer in the family to the patient's knowledge   GYNECOLOGIC HISTORY:  No LMP recorded. Patient is postmenopausal.  menarche age 40, first live birth age 38 the patient is [place] P3. She stopped having periods at age 66. She never used oral contraceptives or hormone replacement.   SOCIAL HISTORY:  General works as a Scientist, product/process development. She is divorced. Currently her son [name] lives with her. He is a Art gallery manager. The 2 other children are MYRTICE LOWDERMILK, who lives in [place] and works in the theater and media, and Kalima Saylor lives in [place], and is vice president of a [employer]. The patient has 5 grandchildren. She is a Psychologist, forensic.     ADVANCED DIRECTIVES: Not in place    HEALTH MAINTENANCE: Social History  Substance Use Topics  . Smoking status: Current Every Day Smoker    Packs/day: 0.50    Years: 40.00    Types: Cigarettes  . Smokeless tobacco: Never Used  . Alcohol use Yes     Comment: occasional     Colonoscopy: September 2012   PAP:  Bone density:09/03/2017    Allergies  Allergen Reactions  . Fish Allergy Anaphylaxis and Swelling    Swelling of hands and feet.  . Peanut-Containing Drug Products Anaphylaxis    Tree nuts included in allergic reaction.  . Ace Inhibitors Other (See Comments) and Cough    Cold like symptoms     Current Outpatient Prescriptions  Medication Sig Dispense Refill  . albuterol (VENTOLIN HFA) 108 (90 BASE) MCG/ACT inhaler Inhale 1-2 puffs into the lungs every 6 (six) hours as needed for wheezing or shortness of breath. 1 Inhaler 6  . Alirocumab (PRALUENT) 75 MG/ML SOPN Inject into the  muscle.    Marland Kitchen amLODipine (NORVASC) 10 MG tablet daily.    . cetirizine (ZYRTEC) 10 MG tablet Take 10 mg by mouth as needed.    Marland Kitchen levothyroxine (SYNTHROID, LEVOTHROID) 50 MCG tablet Take 50 mcg by mouth daily.    Marland Kitchen losartan-hydrochlorothiazide (HYZAAR) 100-12.5 MG tablet daily.  3  . metoprolol succinate (TOPROL-XL) 50 MG 24 hr tablet daily.  6  . mometasone-formoterol (DULERA) 100-5 MCG/ACT AERO Inhale 2 puffs into the lungs 2 (two) times daily. 3 Inhaler 0  . valsartan-hydrochlorothiazide (DIOVAN-HCT) 160-25 MG per tablet Take 1 tablet by mouth daily.  2   No current facility-administered medications for this visit.     OBJECTIVE: Middle-aged African-American woman in no acute distress   Vitals:   09/09/17 0841  BP: (!) 170/86  Pulse: 86  Resp: 18  Temp: 97.8 F (36.6 C)  SpO2: 99%     Body mass index is 32.1 kg/m.   Wt Readings from Last 3 Encounters:  09/09/17 187 lb (84.8 kg)  11/03/14 183 lb (83 kg)  10/06/14 182  lb (82.6 kg)      ECOG FS:1 - Symptomatic but completely ambulatory  Ocular: Sclerae unicteric, pupils round and equal Ear-nose-throat: Oropharynx clear and moist Lymphatic: No cervical or supraclavicular adenopathy Lungs no rales or rhonchi Heart regular rate and rhythm Abd soft, nontender, positive bowel sounds MSK no focal spinal tenderness, no joint edema Neuro: non-focal, well-oriented, appropriate affect Breasts: The right breast is status post recent biopsy. In the right upper quadrant there is an easily palpable mass which measures approximately 1-1/2 cm, not associated with overlying erythema, but with mild dimpling of the surface. There is no nipple change. The right axilla is benign. The left breast is unremarkable. The left axilla is benign.    LAB RESULTS:  CMP     Component Value Date/Time   NA 138 09/09/2017 0830   K 3.8 09/09/2017 0830   CL 99 10/09/2011 0611   CO2 26 09/09/2017 0830   GLUCOSE 106 09/09/2017 0830   BUN 13.4 09/09/2017  0830   CREATININE 0.9 09/09/2017 0830   CALCIUM 9.9 09/09/2017 0830   PROT 7.4 09/09/2017 0830   ALBUMIN 3.6 09/09/2017 0830   AST 15 09/09/2017 0830   ALT 15 09/09/2017 0830   ALKPHOS 124 09/09/2017 0830   BILITOT <0.22 09/09/2017 0830   GFRNONAA >90 10/09/2011 0611   GFRAA >90 10/09/2011 0611    No results found for: TOTALPROTELP, ALBUMINELP, A1GS, A2GS, BETS, BETA2SER, GAMS, MSPIKE, SPEI  No results found for: Nils Pyle, [facility]  Lab Results  Component Value Date   WBC 5.9 09/09/2017   NEUTROABS 2.9 09/09/2017   HGB 12.9 09/09/2017   HCT 38.6 09/09/2017   MCV 86.3 09/09/2017   PLT 338 09/09/2017      Chemistry      Component Value Date/Time   NA 138 09/09/2017 0830   K 3.8 09/09/2017 0830   CL 99 10/09/2011 0611   CO2 26 09/09/2017 0830   BUN 13.4 09/09/2017 0830   CREATININE 0.9 09/09/2017 0830      Component Value Date/Time   CALCIUM 9.9 09/09/2017 0830   ALKPHOS 124 09/09/2017 0830   AST 15 09/09/2017 0830   ALT 15 09/09/2017 0830   BILITOT <0.22 09/09/2017 0830       No results found for: LABCA2  No components found for: PZWCHE527  No results for input(s): INR in the last 168 hours.  No results found for: LABCA2  No results found for: POE423  No results found for: NTI144  No results found for: RXV400  No results found for: CA2729  No components found for: HGQUANT  No results found for: CEA1 / No results found for: CEA1   No results found for: AFPTUMOR  No results found for: [place]  No results found for: PSA1  Appointment on 09/09/2017  Component Date Value Ref Range Status  . WBC 09/09/2017 5.9  3.9 - 10.3 10e3/uL Final  . NEUT# 09/09/2017 2.9  1.5 - 6.5 10e3/uL Final  . HGB 09/09/2017 12.9  11.6 - 15.9 g/dL Final  . HCT 09/09/2017 38.6  34.8 - 46.6 % Final  . Platelets 09/09/2017 338  145 - 400 10e3/uL Final  . MCV 09/09/2017 86.3  79.5 - 101.0 fL Final  . MCH 09/09/2017 28.7  25.1 - 34.0 pg Final  . MCHC  09/09/2017 33.3  31.5 - 36.0 g/dL Final  . RBC 09/09/2017 4.47  3.70 - 5.45 10e6/uL Final  . RDW 09/09/2017 17.3* 11.2 - 14.5 % Final  . lymph# 09/09/2017  2.1  0.9 - 3.3 10e3/uL Final  . MONO# 09/09/2017 0.6  0.1 - 0.9 10e3/uL Final  . Eosinophils Absolute 09/09/2017 0.2  0.0 - 0.5 10e3/uL Final  . Basophils Absolute 09/09/2017 0.1  0.0 - 0.1 10e3/uL Final  . NEUT% 09/09/2017 49.2  38.4 - 76.8 % Final  . LYMPH% 09/09/2017 35.7  14.0 - 49.7 % Final  . MONO% 09/09/2017 10.0  0.0 - 14.0 % Final  . EOS% 09/09/2017 4.0  0.0 - 7.0 % Final  . BASO% 09/09/2017 1.1  0.0 - 2.0 % Final  . Sodium 09/09/2017 138  136 - 145 mEq/L Final  . Potassium 09/09/2017 3.8  3.5 - 5.1 mEq/L Final  . Chloride 09/09/2017 102  98 - 109 mEq/L Final  . CO2 09/09/2017 26  22 - 29 mEq/L Final  . Glucose 09/09/2017 106  70 - 140 mg/dl Final   Glucose reference range is for nonfasting patients. Fasting glucose reference range is 70- 100.  Marland Kitchen BUN 09/09/2017 13.4  7.0 - 26.0 mg/dL Final  . Creatinine 09/09/2017 0.9  0.6 - 1.1 mg/dL Final  . Total Bilirubin 09/09/2017 <0.22  0.20 - 1.20 mg/dL Final  . Alkaline Phosphatase 09/09/2017 124  40 - 150 U/L Final  . AST 09/09/2017 15  5 - 34 U/L Final  . ALT 09/09/2017 15  0 - 55 U/L Final  . Total Protein 09/09/2017 7.4  6.4 - 8.3 g/dL Final  . Albumin 09/09/2017 3.6  3.5 - 5.0 g/dL Final  . Calcium 09/09/2017 9.9  8.4 - 10.4 mg/dL Final  . Anion Gap 09/09/2017 10  3 - 11 mEq/L Final  . EGFR 09/09/2017 72* >90 ml/min/1.73 m2 Final   eGFR is calculated using the CKD-EPI Creatinine Equation (2009)    (this displays the last labs from the last 3 days)  No results found for: TOTALPROTELP, ALBUMINELP, A1GS, A2GS, BETS, BETA2SER, GAMS, MSPIKE, SPEI (this displays SPEP labs)  No results found for: KPAFRELGTCHN, LAMBDASER, KAPLAMBRATIO (kappa/lambda light chains)  No results found for: HGBA, HGBA2QUANT, HGBFQUANT, HGBSQUAN (Hemoglobinopathy evaluation)   No results found for:  LDH  No results found for: IRON, TIBC, IRONPCTSAT (Iron and TIBC)  No results found for: FERRITIN  Urinalysis    Component Value Date/Time   LABSPEC 1.020 02/10/2011 2058   PHURINE 6.0 02/10/2011 2058   HGBUR LARGE (A) 02/10/2011 2058   BILIRUBINUR MODERATE (A) 02/10/2011 2058   KETONESUR 15 (A) 02/10/2011 2058   PROTEINUR >=300 (A) 02/10/2011 2058   UROBILINOGEN >=8.0 02/10/2011 2058   NITRITE NEGATIVE 02/10/2011 2058   LEUKOCYTESUR  02/10/2011 2058    NEGATIVE Biochemical Testing Only. Please order routine urinalysis from main lab if confirmatory testing is needed.     STUDIES:  outside studies discussed with patient   ELIGIBLE FOR AVAILABLE RESEARCH PROTOCOL: no  ASSESSMENT: 66 y.o. [place] woman status post right breast upper outer quadrant and right axillary lymph node biopsy 09/03/2017, both positive for invasive ductal carcinoma, grade 3, estrogen receptor positive, progesterone receptor negative, with an MIB-1 of 90%, the lymph node being HER-2 positive, the breast mass HER-2 negative  (1) neoadjuvant chemotherapy with carboplatin, docetaxel, trastuzumab, and pertuzumab to start 10/01/2017  (2) trastuzumab to continue to complete 12 months  (3) definitive surgery to follow chemotherapy  (4) adjuvant radiation to follow  (5) anti-estrogens to start at the completion of local treatment  PLAN: We spent the better part of today's hour-long appointment discussing the biology of her diagnosis and the specifics  of her situation. We first reviewed the fact that cancer is not one disease but more than 100 different diseases and that it is important to keep them separate-- otherwise when friends and relatives discuss their own cancer experiences with Imelda confusion can result. Similarly we explained that if breast cancer spreads to the bone or liver, the patient would not have bone cancer or liver cancer, but breast cancer in the bone and breast cancer in the liver: one  cancer in three places-- not 3 different cancers which otherwise would have to be treated in 3 different ways.  We discussed the difference between local and systemic therapy. In terms of loco-regional treatment, lumpectomy plus radiation is equivalent to mastectomy as far as survival is concerned. We also noted that in terms of sequencing of treatments, whether systemic therapy or surgery is done first does not affect the ultimate outcome. In Phyllis's case we recommend starting with chemotherapy, which should make the subsequent surgery easier and optimize her chances for lumpectomy.  We then discussed the rationale for systemic therapy. There is some risk that this cancer may have already spread to other parts of her body. We are going to go ahead and obtain a bone scan and CT scan of the chest, and she understands I expect these to be negative. She also understands that if these are clear as we hope it does not mean she does not have microscopic disease elsewhere in her body and in fact I quoted her a 60% risk of already having microscopic breast cancer in her liver, lungs or bones. This is the reason she needs to optimize systemic treatment.  Next we went over the options for systemic therapy which are anti-estrogens, anti-HER-2 immunotherapy, and chemotherapy. Razan is a candidate for all 3, and that is our recommendation.  More specifically she will receive carboplatin, docetaxel, trastuzumab, and pertuzumab. We discussed the possible toxicities, side effects and complications in detail today. She understands the need for port and she will have an echocardiogram before the start of treatment and every 3 months during trastuzumab treatment. She will also come to chemotherapy school before start of treatment to reinforce her understanding of side effects and also to discuss how best to manage them..  The overall plan then is to start with chemotherapy, continue with anti-HER-2 treatment for a year,  proceed to surgery most likely lumpectomy with targeted axillary dissection after chemotherapy, then adjuvant radiation, at which point she would start her anti-estrogens and continue those for a minimum of 5 years.  Ridhima has a good understanding of the overall plan. She agrees with it. She knows the goal of treatment in her case is cure. She will call with any problems that may develop before her next visit here.  Chauncey Cruel, MD   09/09/2017 10:02 AM Medical Oncology and Hematology [facility] [address] [place], [place] [address] Tel. [phone]    Fax. [phone]

## 2017-09-10 ENCOUNTER — Telehealth: Payer: Self-pay | Admitting: *Deleted

## 2017-09-10 NOTE — Telephone Encounter (Signed)
"  Forgot to ask when I came in yesterday if it is okay to receive Flu vaccine at work.  I will begin chemotherapy October 4 th.  Flu Vaccine is free today at work."  Shared with patient it is okay to receive Flu vaccine before chemotherapy treatment begins.  No further questions at this time.

## 2017-09-14 ENCOUNTER — Telehealth: Payer: Self-pay | Admitting: *Deleted

## 2017-09-14 NOTE — Telephone Encounter (Signed)
Left vm regarding [facility] from 09/09/17 to return call with questions concerning dx or treatment care plan. Request return call. Contact information provided.

## 2017-09-15 ENCOUNTER — Other Ambulatory Visit: Payer: Self-pay | Admitting: Oncology

## 2017-09-17 ENCOUNTER — Other Ambulatory Visit: Payer: Self-pay | Admitting: Oncology

## 2017-09-17 NOTE — Progress Notes (Signed)
I called [name] to let her know that I will be out of town as she starts chemotherapy, reassuring her that when she sees my APP on October 2 she will review all the results of her scans and get all the information she needs so that she can have a successful first treatment.

## 2017-09-19 NOTE — H&P (Signed)
Tracie [name] Location: [facility] Patient #: [id number] DOB: 1951/02/16 Divorced / Language: English / Race: Black or African American Female   History of Present Illness  The patient is a 66 year old female who presents with breast cancer. This is a 66 year old female, referred by Dr. Isaiah Blakes at [facility] imaging for evaluation and management of a locally advanced cancer of the right breast, upper outer quadrant. Her PCP is Precious Haws. Her cardiologist has been Dr. Einar Gip. She is seen in the [facility] today by Dr. Jana Hakim, Dr. Isidore Moos, and me.  She has no prior breast problems. Last mammogram 2015. She saw some skin dimpling and felt a small lump in the upper outer right breast and went for imaging studies. Imaging shows a discrete mass in the far upper outer quadrant under the skin, 2.1 cm. Ultrasound shows 2 abnormal lymph nodes. Biopsy of the right breast mass shows invasive ductal carcinoma, receptor positive, HER-2 negative, Ki-67 90%. Grade 3. Biopsy of the lymph node shows metastatic cancer. Consensus opinion in conference this morning is that she should undergo staging scans, MRI, neoadjuvant chemotherapy ...the patient is in agreement with this.  Past history is positive for asthma, hypertension, hypothyroidism. In 2015 she was evaluated for angina variant by Dr. Einar Gip. She had echo and other noninvasive test but never had a cath. She's had no more symptoms of chest fullness. C-section. Two-stage colon resection for ruptured diverticulitis. Family history negative for breast or ovarian cancer. Mother died of metastatic rectal cancer. Social history reveals she is single and divorced. Lives with her son in [place]. Works as a Radiation protection practitioner for [employer]. Smokes one half pack of cigarettes per day. Takes alcohol occasionally.  We had a very long talk about treatment plan in general as well as surgical options. She is undecided as to  whether she wants a lumpectomy or mastectomy. I told her that if her MRI was favorable we could consider lumpectomy and targeted axillary node dissection following neoadjuvant chemotherapy. She is in favor of neoadjuvant chemotherapy to see if we can downstage the tumor and to complete systemic therapy.   Plan is as follows: Schedule for insertion of Port-A-Cath as urgent procedure. I have discussed the indications, details, techniques, and risk of the surgery in detail. She is aware the risk of bleeding, infection, bilateral attempts, failure to insert, pneumothorax requiring reexpansion of lung, air embolus, and other unforeseen problems. She understands all these issues. All of her questions are answered and she agrees with this plan.  MRI scheduled September 25 CT and bone scan schedule September 27 Possible genetic testing for Lynch syndrome Cardiac clearance for Port-A-Cath insertion Chemotherapy to start October 4   My office has has been notified Orders have been entered Posting sheet has been completed   Medication History Medications Reconciled    Physical Exam  General Mental Status-Alert. General Appearance-Consistent with stated age. Hydration-Well hydrated. Voice-Normal. Note: Somewhat overweight. Weight 187. Height 5 feet 3 inches.   Head and Neck Head-normocephalic, atraumatic with no lesions or palpable masses. Trachea-midline. Thyroid Gland Characteristics - normal size and consistency.  Eye Eyeball - Bilateral-Extraocular movements intact. Sclera/Conjunctiva - Bilateral-No scleral icterus.  Chest and Lung Exam Chest and lung exam reveals -quiet, even and easy respiratory effort with no use of accessory muscles and on auscultation, normal breath sounds, no adventitious sounds and normal vocal resonance. Inspection Chest Wall - Normal. Back - normal.  Breast Note: Right breast reveals subtle dimpling an small palpable  mass  high in the upper outer right breast. Mass is 2 cm or less quite mobile nontender. I do not feel any mass elsewhere in either breast. I do not feel any abnormal lymph nodes. Body habitus is a little large.   Cardiovascular Cardiovascular examination reveals -normal heart sounds, regular rate and rhythm with no murmurs and normal pedal pulses bilaterally.  Abdomen Inspection Inspection of the abdomen reveals - No Hernias. Skin - Scar - Note: Well-healed lower midline scar. Well healed ostomy scar left lower quadrant. Pfannenstiel incision. Palpation/Percussion Palpation and Percussion of the abdomen reveal - Soft, Non Tender, No Rebound tenderness, No Rigidity (guarding) and No hepatosplenomegaly. Auscultation Auscultation of the abdomen reveals - Bowel sounds normal.  Neurologic Neurologic evaluation reveals -alert and oriented x 3 with no impairment of recent or remote memory. Mental Status-Normal.  Musculoskeletal Normal Exam - Left-Upper Extremity Strength Normal and Lower Extremity Strength Normal. Normal Exam - Right-Upper Extremity Strength Normal and Lower Extremity Strength Normal.  Lymphatic Head & Neck  General Head & Neck Lymphatics: Bilateral - Description - Normal. Axillary  General Axillary Region: Bilateral - Description - Normal. Tenderness - Non Tender. Femoral & Inguinal  Generalized Femoral & Inguinal Lymphatics: Bilateral - Description - Normal. Tenderness - Non Tender.      Assessment & Plan PRIMARY CANCER OF UPPER OUTER QUADRANT OF RIGHT FEMALE BREAST (C50.411)    you recently felt a lump and skin dimpling in the upper outer right breast Imaging studies and biopsies showed cancer in the right breast and in the lymph nodes under your right arm This is a high-grade tumor  You will be scheduled for MRI of the breast on September 25 You will be scheduled for CT scan and bone scan on September 27  Dr. Darrel Hoover office will call you and  schedule Port-A-Cath insertion so that you can begin your chemotherapy on October 4 Dr. Dalbert Batman has discussed the indications, techniques, and risks of Port-A-Cath insertion in detail After you complete your neoadjuvant chemotherapy, you will have surgery on your right breast and on the lymph nodes, and this was discussed in detail We will discuss the extent of breast surgery in more detail once we see the MRI and your response to chemotherapy.  We will need to ask Dr. Einar Gip to assess your cardiac risk for anesthesia, hopefully that will not be much of a problem since you have had a complete workup before  ASTHMA (J45.909) HYPERTENSION, ESSENTIAL (I10) OVERWEIGHT (E66.3) ANGINA PECTORIS (I20.9) DIVERTICULITIS, COLON (K57.32) Impression: history two-stage resection H/O: C-SECTION (V95.638) FAMILY HISTORY OF RECTAL CANCER (Z80.0) Impression: mother. Deceased TOBACCO ABUSE (Z72.0)    Edsel Petrin. Dalbert Batman, M.D., [facility] General and Minimally invasive Surgery Breast and Colorectal Surgery Office:   [phone] Pager:   [phone]

## 2017-09-21 ENCOUNTER — Other Ambulatory Visit: Payer: Self-pay | Admitting: *Deleted

## 2017-09-21 ENCOUNTER — Telehealth: Payer: Self-pay | Admitting: Radiation Oncology

## 2017-09-21 DIAGNOSIS — C50411 Malignant neoplasm of upper-outer quadrant of right female breast: Secondary | ICD-10-CM

## 2017-09-21 DIAGNOSIS — Z170 Estrogen receptor positive status [ER+]: Principal | ICD-10-CM

## 2017-09-21 NOTE — Telephone Encounter (Signed)
Horris Latino calling from [facility] MRI calling our office after trying Dr. [name] office, but was told Dr. Jana Hakim is out. I spoke with Juliann Pulse, triage nurse for Dr. Jana Hakim, to get an order put in for pt breast MRI scheduled for tomorrow. She said she will get it taken care of.

## 2017-09-22 ENCOUNTER — Other Ambulatory Visit (HOSPITAL_COMMUNITY): Payer: Medicare Other

## 2017-09-22 ENCOUNTER — Ambulatory Visit (HOSPITAL_BASED_OUTPATIENT_CLINIC_OR_DEPARTMENT_OTHER)
Admission: RE | Admit: 2017-09-22 | Discharge: 2017-09-22 | Disposition: A | Discharge status: Home / Self Care | Payer: 59 | Source: Ambulatory Visit | Attending: Oncology | Admitting: Oncology

## 2017-09-22 ENCOUNTER — Other Ambulatory Visit: Payer: 59

## 2017-09-22 ENCOUNTER — Ambulatory Visit (HOSPITAL_COMMUNITY)
Admission: RE | Admit: 2017-09-22 | Discharge: 2017-09-22 | Disposition: A | Discharge status: Home / Self Care | Payer: 59 | Source: Ambulatory Visit | Attending: Oncology | Admitting: Oncology

## 2017-09-22 DIAGNOSIS — C773 Secondary and unspecified malignant neoplasm of axilla and upper limb lymph nodes: Secondary | ICD-10-CM | POA: Diagnosis not present

## 2017-09-22 DIAGNOSIS — Z170 Estrogen receptor positive status [ER+]: Secondary | ICD-10-CM | POA: Insufficient documentation

## 2017-09-22 DIAGNOSIS — C50411 Malignant neoplasm of upper-outer quadrant of right female breast: Secondary | ICD-10-CM | POA: Insufficient documentation

## 2017-09-22 DIAGNOSIS — I080 Rheumatic disorders of both mitral and aortic valves: Secondary | ICD-10-CM | POA: Diagnosis not present

## 2017-09-22 DIAGNOSIS — I10 Essential (primary) hypertension: Secondary | ICD-10-CM | POA: Diagnosis not present

## 2017-09-22 MED ORDER — GADOBENATE DIMEGLUMINE 529 MG/ML IV SOLN
20.0000 mL | Freq: Once | INTRAVENOUS | Status: AC | PRN
  Administered 2017-09-22: 17 mL via INTRAVENOUS

## 2017-09-22 NOTE — Progress Notes (Signed)
  Echocardiogram 2D Echocardiogram has been performed.  Tracie [name] 09/22/2017, 11:54 AM 

## 2017-09-23 ENCOUNTER — Encounter (HOSPITAL_COMMUNITY): Payer: Self-pay

## 2017-09-23 ENCOUNTER — Encounter (HOSPITAL_COMMUNITY)
Admission: RE | Admit: 2017-09-23 | Discharge: 2017-09-23 | Disposition: A | Discharge status: Home / Self Care | Payer: 59 | Source: Ambulatory Visit | Attending: General Surgery | Admitting: General Surgery

## 2017-09-23 DIAGNOSIS — J432 Centrilobular emphysema: Secondary | ICD-10-CM | POA: Diagnosis not present

## 2017-09-23 DIAGNOSIS — I700 Atherosclerosis of aorta: Secondary | ICD-10-CM | POA: Diagnosis not present

## 2017-09-23 DIAGNOSIS — R590 Localized enlarged lymph nodes: Secondary | ICD-10-CM | POA: Diagnosis not present

## 2017-09-23 DIAGNOSIS — D259 Leiomyoma of uterus, unspecified: Secondary | ICD-10-CM | POA: Diagnosis not present

## 2017-09-23 DIAGNOSIS — N83202 Unspecified ovarian cyst, left side: Secondary | ICD-10-CM | POA: Diagnosis not present

## 2017-09-23 DIAGNOSIS — M899 Disorder of bone, unspecified: Secondary | ICD-10-CM | POA: Diagnosis not present

## 2017-09-23 DIAGNOSIS — C50411 Malignant neoplasm of upper-outer quadrant of right female breast: Secondary | ICD-10-CM | POA: Diagnosis present

## 2017-09-23 DIAGNOSIS — I2510 Atherosclerotic heart disease of native coronary artery without angina pectoris: Secondary | ICD-10-CM | POA: Diagnosis not present

## 2017-09-23 DIAGNOSIS — Z170 Estrogen receptor positive status [ER+]: Secondary | ICD-10-CM | POA: Diagnosis not present

## 2017-09-23 DIAGNOSIS — N7011 Chronic salpingitis: Secondary | ICD-10-CM | POA: Diagnosis not present

## 2017-09-23 DIAGNOSIS — D3501 Benign neoplasm of right adrenal gland: Secondary | ICD-10-CM | POA: Diagnosis not present

## 2017-09-23 DIAGNOSIS — M47816 Spondylosis without myelopathy or radiculopathy, lumbar region: Secondary | ICD-10-CM | POA: Diagnosis not present

## 2017-09-23 DIAGNOSIS — M5136 Other intervertebral disc degeneration, lumbar region: Secondary | ICD-10-CM | POA: Diagnosis not present

## 2017-09-23 HISTORY — DX: Presence of dental prosthetic device (complete) (partial): Z97.2

## 2017-09-23 HISTORY — DX: Presence of spectacles and contact lenses: Z97.3

## 2017-09-23 HISTORY — DX: Hypothyroidism, unspecified: E03.9

## 2017-09-23 HISTORY — DX: Malignant (primary) neoplasm, unspecified: C80.1

## 2017-09-23 LAB — CBC
HEMATOCRIT: 38.5 % (ref 36.0–46.0)
Hemoglobin: 12.5 g/dL (ref 12.0–15.0)
MCH: 28.2 pg (ref 26.0–34.0)
MCHC: 32.5 g/dL (ref 30.0–36.0)
MCV: 86.7 fL (ref 78.0–100.0)
PLATELETS: 335 10*3/uL (ref 150–400)
RBC: 4.44 MIL/uL (ref 3.87–5.11)
RDW: 17 % — ABNORMAL HIGH (ref 11.5–15.5)
WBC: 6.1 10*3/uL (ref 4.0–10.5)

## 2017-09-23 LAB — BASIC METABOLIC PANEL
ANION GAP: 9 (ref 5–15)
BUN: 9 mg/dL (ref 6–20)
CO2: 26 mmol/L (ref 22–32)
Calcium: 9.5 mg/dL (ref 8.9–10.3)
Chloride: 102 mmol/L (ref 101–111)
Creatinine, Ser: 0.84 mg/dL (ref 0.44–1.00)
GLUCOSE: 98 mg/dL (ref 65–99)
POTASSIUM: 3.2 mmol/L — AB (ref 3.5–5.1)
Sodium: 137 mmol/L (ref 135–145)

## 2017-09-23 NOTE — Pre-Procedure Instructions (Addendum)
    Tracie [name]  09/23/2017      [facility], [address] [place] [address] Phone: [phone] Fax: [phone]  [facility] [place], [place] [address] [place] [address] Phone: [phone] Fax: [phone]    Your procedure is scheduled on Friday, September 25, 2017  Report to [facility] Admitting at 5:30 A.M.  Call this number if you have problems the morning of surgery:  [phone]   Remember:  Do not eat food or drink liquids after midnight Thursday, September 24, 2017  Take these medicines the morning of surgery with A SIP OF WATER :amLODipine (NORVASC),  levothyroxine (SYNTHROID), metoprolol succinate (TOPROL-XL), mometasone-formoterol (DULERA), if needed: cetirizine (ZYRTEC), albuterol (VENTOLIN HFA)  inhaler for wheezing or shortness of breath (bring inhaler in with you day of surgery). Stop taking Aspirin, vitamins, fish oil and herbal medications. Do not take any NSAIDs ie: Ibuprofen, Advil, Naproxen (Aleve), Motrin, BC and Goody Powder; stop now.  Do not wear jewelry, make-up or nail polish.  Do not wear lotions, powders, or perfumes, or deoderant.  Do not shave 48 hours prior to surgery.    Do not bring valuables to the hospital.  [facility] is not responsible for any belongings or valuables. Contacts, dentures or bridgework may not be worn into surgery.  Leave your suitcase in the car.  After surgery it may be brought to your room. Patients discharged the day of surgery will not be allowed to drive home.  Special instructions: Shower the night before surgery and the morning of surgery with CHG. Please read over the following fact sheets that you were given. Pain Booklet, Coughing and Deep Breathing and Surgical Site Infection Prevention

## 2017-09-23 NOTE — Progress Notes (Signed)
Pt denies any acute cardiopulmonary issues. Pt under the care of Dr. Einar Gip, Cardiology. Pt denies having a cardiac cath. Echo performed 09/22/17 results are still pending. Cardiac records (EKG, stress test and LOV note) requested from Dr. Einar Gip is on pt chart. Pt chart forwarded to anesthesia for review.

## 2017-09-24 ENCOUNTER — Encounter (HOSPITAL_COMMUNITY)
Admission: RE | Admit: 2017-09-24 | Discharge: 2017-09-24 | Disposition: A | Discharge status: Home / Self Care | Payer: 59 | Source: Ambulatory Visit | Attending: Oncology | Admitting: Oncology

## 2017-09-24 ENCOUNTER — Ambulatory Visit (HOSPITAL_COMMUNITY)
Admission: RE | Admit: 2017-09-24 | Discharge: 2017-09-24 | Disposition: A | Discharge status: Home / Self Care | Payer: 59 | Source: Ambulatory Visit | Attending: Oncology | Admitting: Oncology

## 2017-09-24 DIAGNOSIS — D3501 Benign neoplasm of right adrenal gland: Secondary | ICD-10-CM | POA: Insufficient documentation

## 2017-09-24 DIAGNOSIS — J432 Centrilobular emphysema: Secondary | ICD-10-CM | POA: Insufficient documentation

## 2017-09-24 DIAGNOSIS — N83202 Unspecified ovarian cyst, left side: Secondary | ICD-10-CM | POA: Insufficient documentation

## 2017-09-24 DIAGNOSIS — M899 Disorder of bone, unspecified: Secondary | ICD-10-CM | POA: Insufficient documentation

## 2017-09-24 DIAGNOSIS — D259 Leiomyoma of uterus, unspecified: Secondary | ICD-10-CM | POA: Insufficient documentation

## 2017-09-24 DIAGNOSIS — Z170 Estrogen receptor positive status [ER+]: Principal | ICD-10-CM

## 2017-09-24 DIAGNOSIS — R590 Localized enlarged lymph nodes: Secondary | ICD-10-CM | POA: Insufficient documentation

## 2017-09-24 DIAGNOSIS — C50411 Malignant neoplasm of upper-outer quadrant of right female breast: Secondary | ICD-10-CM

## 2017-09-24 DIAGNOSIS — I700 Atherosclerosis of aorta: Secondary | ICD-10-CM | POA: Insufficient documentation

## 2017-09-24 DIAGNOSIS — M47816 Spondylosis without myelopathy or radiculopathy, lumbar region: Secondary | ICD-10-CM | POA: Insufficient documentation

## 2017-09-24 DIAGNOSIS — N7011 Chronic salpingitis: Secondary | ICD-10-CM | POA: Insufficient documentation

## 2017-09-24 DIAGNOSIS — M5136 Other intervertebral disc degeneration, lumbar region: Secondary | ICD-10-CM | POA: Insufficient documentation

## 2017-09-24 DIAGNOSIS — I2510 Atherosclerotic heart disease of native coronary artery without angina pectoris: Secondary | ICD-10-CM | POA: Insufficient documentation

## 2017-09-24 MED ORDER — TECHNETIUM TC 99M MEDRONATE IV KIT
19.6000 | PACK | Freq: Once | INTRAVENOUS | Status: AC | PRN
  Administered 2017-09-24: 19.6 via INTRAVENOUS

## 2017-09-24 MED ORDER — IOPAMIDOL (ISOVUE-300) INJECTION 61%
100.0000 mL | Freq: Once | INTRAVENOUS | Status: AC | PRN
  Administered 2017-09-24: 100 mL via INTRAVENOUS

## 2017-09-24 MED ORDER — IOPAMIDOL (ISOVUE-300) INJECTION 61%
INTRAVENOUS | Status: AC
  Filled 2017-09-24: qty 100

## 2017-09-24 NOTE — Progress Notes (Signed)
Anesthesia Chart Review:  Pt is a 66 year old female scheduled for insertion of port-a-cath insertion on 09/25/2017 with [name], MD  - PCP is Precious Haws, MD (notes in care everywhere)  - Oncologist is Lurline Del, MD - [facility] cardiology care at [facility]. Last office visit note 04/06/17 with Jeri Lager, NP  PMH includes:  HTN, hyperlipidemia, hypothyroidism, asthma, breast cancer. Current smoker. BMI 32  Medications include: albuterol, alirocumab, amlodipine, dexamethasone, levothyroxine, losartan-hctz, metoprolol, dulera  BP (!) 153/82   Pulse 89   Temp 36.8 C   Resp 18   Ht 5\' 4"  (1.626 m)   Wt 184 lb 4.8 oz (83.6 kg)   SpO2 96%   BMI 31.64 kg/m   Preoperative labs reviewed.    EKG 04/06/17 [facility] cardiovascular): Sinus rhythm. LA enlargement. Poor R wave progression. Nonspecific T wave abnormality.  Echo 09/22/17:  - Left ventricle: The cavity size was normal. Systolic function was at the lower limits of normal. The estimated ejection fraction was in the range of 50% to 55%. Doppler parameters are consistent with restrictive pattern, indicative of decreased left ventricular diastolic compliance and/or increased left atrial pressure (grade 3 diastolic dysfunction). Doppler parameters are consistent with both elevated ventricular end-diastolic filling pressure and elevated left atrial filling pressure. - Aortic valve: There was mild regurgitation. - Mitral valve: Mild prolapse, involving the anterior leaflet. There was mild to moderate regurgitation. - Atrial septum: No defect or patent foramen ovale was identified.  Nuclear stress test 10/13/14 [facility] cardiovascular): 1. Resting EKG demonstrated NSR, normal resting conduction, and 1 mm ST depression in lateral leads. Stress EKG revealed 2.5 mm horizontal ST depression in leads: 2, 3, aVF, V4, V5, V6 consistent with myocardial ischemia. Stress symptoms were shortness of breath which resolved in  recovery. 2. LV moderately dilatedwith LV diastolic volume 324 mL both the rest and stress images. Perfusion imaging study demonstrated moderate sized moderate to severe ischemia in the inferior and lateral wall extending from the base towards apex in the distribution of the circumflex coronary artery or a dominant right coronary artery. LV systolic function calculated by QGS was markedly depressed at 32% with inferior and lateral hypokinesis and global hypokinesis this represents high risk study. Consider further workup including cardiac catheterization if clinically indicated.  - Last office visit note 11/16/14 at [facility] cardiovascular documents results of echo and stress test were discussed with patient at length and the patient elected medical therapy instead of cardiac cath.  Reviewed case with Dr. Sabra Heck.  Ok for pt to proceed with port-a-cath insertion, but if pt will need more extensive surgical procedure in the future, she will need cardiac evaluation prior to that.  I notified Armen in Dr. Darrel Hoover office.   If no changes, I anticipate pt can proceed with surgery as scheduled.    Willeen Cass, FNP-BC [facility]/Anesthesiology Phone: [phone] 09/24/2017 2:33 PM

## 2017-09-25 ENCOUNTER — Ambulatory Visit (HOSPITAL_COMMUNITY): Payer: 59

## 2017-09-25 ENCOUNTER — Encounter (HOSPITAL_COMMUNITY): Payer: Self-pay

## 2017-09-25 ENCOUNTER — Ambulatory Visit (HOSPITAL_COMMUNITY): Payer: 59 | Admitting: Emergency Medicine

## 2017-09-25 ENCOUNTER — Ambulatory Visit (HOSPITAL_COMMUNITY)
Admission: RE | Admit: 2017-09-25 | Discharge: 2017-09-25 | Disposition: A | Discharge status: Home / Self Care | Payer: 59 | Source: Ambulatory Visit | Attending: General Surgery | Admitting: General Surgery

## 2017-09-25 ENCOUNTER — Encounter (HOSPITAL_COMMUNITY)
Admission: RE | Disposition: A | Discharge status: Home / Self Care | Payer: Self-pay | Source: Ambulatory Visit | Attending: General Surgery

## 2017-09-25 ENCOUNTER — Ambulatory Visit (HOSPITAL_COMMUNITY): Payer: 59 | Admitting: Certified Registered Nurse Anesthetist

## 2017-09-25 DIAGNOSIS — C773 Secondary and unspecified malignant neoplasm of axilla and upper limb lymph nodes: Secondary | ICD-10-CM | POA: Diagnosis not present

## 2017-09-25 DIAGNOSIS — Z8719 Personal history of other diseases of the digestive system: Secondary | ICD-10-CM | POA: Insufficient documentation

## 2017-09-25 DIAGNOSIS — I10 Essential (primary) hypertension: Secondary | ICD-10-CM | POA: Diagnosis not present

## 2017-09-25 DIAGNOSIS — Z800 Family history of malignant neoplasm of digestive organs: Secondary | ICD-10-CM | POA: Diagnosis not present

## 2017-09-25 DIAGNOSIS — Z419 Encounter for procedure for purposes other than remedying health state, unspecified: Secondary | ICD-10-CM

## 2017-09-25 DIAGNOSIS — J45909 Unspecified asthma, uncomplicated: Secondary | ICD-10-CM | POA: Diagnosis not present

## 2017-09-25 DIAGNOSIS — Z9049 Acquired absence of other specified parts of digestive tract: Secondary | ICD-10-CM | POA: Diagnosis not present

## 2017-09-25 DIAGNOSIS — C50411 Malignant neoplasm of upper-outer quadrant of right female breast: Secondary | ICD-10-CM | POA: Diagnosis present

## 2017-09-25 DIAGNOSIS — Z170 Estrogen receptor positive status [ER+]: Secondary | ICD-10-CM

## 2017-09-25 DIAGNOSIS — Z6831 Body mass index (BMI) 31.0-31.9, adult: Secondary | ICD-10-CM | POA: Insufficient documentation

## 2017-09-25 DIAGNOSIS — F17210 Nicotine dependence, cigarettes, uncomplicated: Secondary | ICD-10-CM | POA: Diagnosis not present

## 2017-09-25 DIAGNOSIS — E663 Overweight: Secondary | ICD-10-CM | POA: Diagnosis not present

## 2017-09-25 DIAGNOSIS — E039 Hypothyroidism, unspecified: Secondary | ICD-10-CM | POA: Insufficient documentation

## 2017-09-25 DIAGNOSIS — Z95828 Presence of other vascular implants and grafts: Secondary | ICD-10-CM

## 2017-09-25 HISTORY — PX: PORTACATH PLACEMENT: SHX2246

## 2017-09-25 SURGERY — INSERTION, TUNNELED CENTRAL VENOUS DEVICE, WITH PORT
Anesthesia: General | Site: Chest

## 2017-09-25 MED ORDER — LACTATED RINGERS IV SOLN
INTRAVENOUS | Status: DC | PRN
  Administered 2017-09-25 (×2): via INTRAVENOUS

## 2017-09-25 MED ORDER — LIDOCAINE-EPINEPHRINE 1 %-1:100000 IJ SOLN
INTRAMUSCULAR | Status: AC
  Filled 2017-09-25: qty 1

## 2017-09-25 MED ORDER — HEPARIN SOD (PORK) LOCK FLUSH 100 UNIT/ML IV SOLN
INTRAVENOUS | Status: AC
  Filled 2017-09-25: qty 5

## 2017-09-25 MED ORDER — PHENYLEPHRINE 40 MCG/ML (10ML) SYRINGE FOR IV PUSH (FOR BLOOD PRESSURE SUPPORT)
PREFILLED_SYRINGE | INTRAVENOUS | Status: DC | PRN
  Administered 2017-09-25 (×6): 80 ug via INTRAVENOUS
  Administered 2017-09-25: 40 ug via INTRAVENOUS

## 2017-09-25 MED ORDER — EPHEDRINE 5 MG/ML INJ
INTRAVENOUS | Status: AC
  Filled 2017-09-25: qty 10

## 2017-09-25 MED ORDER — ALBUTEROL SULFATE HFA 108 (90 BASE) MCG/ACT IN AERS
INHALATION_SPRAY | RESPIRATORY_TRACT | Status: DC | PRN
  Administered 2017-09-25: 2 via RESPIRATORY_TRACT

## 2017-09-25 MED ORDER — OXYCODONE HCL 5 MG/5ML PO SOLN: 5.0000 mg | Freq: Once | ORAL | Status: DC | PRN

## 2017-09-25 MED ORDER — FENTANYL CITRATE (PF) 100 MCG/2ML IJ SOLN: 25.0000 ug | INTRAMUSCULAR | Status: DC | PRN

## 2017-09-25 MED ORDER — DEXAMETHASONE SODIUM PHOSPHATE 10 MG/ML IJ SOLN
INTRAMUSCULAR | Status: AC
  Filled 2017-09-25: qty 1

## 2017-09-25 MED ORDER — CHLORHEXIDINE GLUCONATE CLOTH 2 % EX PADS: 6.0000 | MEDICATED_PAD | Freq: Once | CUTANEOUS | Status: DC

## 2017-09-25 MED ORDER — EPHEDRINE SULFATE-NACL 50-0.9 MG/10ML-% IV SOSY
PREFILLED_SYRINGE | INTRAVENOUS | Status: DC | PRN
  Administered 2017-09-25 (×2): 10 mg via INTRAVENOUS
  Administered 2017-09-25 (×4): 5 mg via INTRAVENOUS

## 2017-09-25 MED ORDER — IOPAMIDOL (ISOVUE-300) INJECTION 61%
INTRAVENOUS | Status: DC | PRN
  Administered 2017-09-25: 50 mL

## 2017-09-25 MED ORDER — LIDOCAINE-EPINEPHRINE 0.5 %-1:200000 IJ SOLN
INTRAMUSCULAR | Status: AC
  Filled 2017-09-25: qty 1

## 2017-09-25 MED ORDER — LIDOCAINE 2% (20 MG/ML) 5 ML SYRINGE
INTRAMUSCULAR | Status: DC | PRN
  Administered 2017-09-25: 100 mg via INTRAVENOUS

## 2017-09-25 MED ORDER — HYDROCODONE-ACETAMINOPHEN 5-325 MG PO TABS: 1.0000 | ORAL_TABLET | Freq: Four times a day (QID) | ORAL | 0 refills | Status: DC | PRN

## 2017-09-25 MED ORDER — ONDANSETRON HCL 4 MG/2ML IJ SOLN
INTRAMUSCULAR | Status: DC | PRN
  Administered 2017-09-25: 4 mg via INTRAVENOUS

## 2017-09-25 MED ORDER — LIDOCAINE 2% (20 MG/ML) 5 ML SYRINGE
INTRAMUSCULAR | Status: AC
  Filled 2017-09-25: qty 5

## 2017-09-25 MED ORDER — IOPAMIDOL (ISOVUE-300) INJECTION 61%
INTRAVENOUS | Status: AC
  Filled 2017-09-25: qty 50

## 2017-09-25 MED ORDER — PROPOFOL 10 MG/ML IV BOLUS
INTRAVENOUS | Status: DC | PRN
  Administered 2017-09-25: 200 mg via INTRAVENOUS

## 2017-09-25 MED ORDER — MEPERIDINE HCL 25 MG/ML IJ SOLN: 6.2500 mg | INTRAMUSCULAR | Status: DC | PRN

## 2017-09-25 MED ORDER — MIDAZOLAM HCL 5 MG/5ML IJ SOLN
INTRAMUSCULAR | Status: DC | PRN
  Administered 2017-09-25: 1 mg via INTRAVENOUS

## 2017-09-25 MED ORDER — CEFAZOLIN SODIUM-DEXTROSE 2-4 GM/100ML-% IV SOLN
2.0000 g | INTRAVENOUS | Status: AC
  Administered 2017-09-25: 2 g via INTRAVENOUS
  Filled 2017-09-25: qty 100

## 2017-09-25 MED ORDER — ONDANSETRON HCL 4 MG/2ML IJ SOLN
INTRAMUSCULAR | Status: AC
  Filled 2017-09-25: qty 2

## 2017-09-25 MED ORDER — SODIUM CHLORIDE 0.9 % IV SOLN
INTRAVENOUS | Status: DC | PRN
  Administered 2017-09-25: 08:00:00 500 mL

## 2017-09-25 MED ORDER — PHENYLEPHRINE 40 MCG/ML (10ML) SYRINGE FOR IV PUSH (FOR BLOOD PRESSURE SUPPORT)
PREFILLED_SYRINGE | INTRAVENOUS | Status: AC
  Filled 2017-09-25: qty 10

## 2017-09-25 MED ORDER — MIDAZOLAM HCL 2 MG/2ML IJ SOLN
INTRAMUSCULAR | Status: AC
  Filled 2017-09-25: qty 2

## 2017-09-25 MED ORDER — BUPIVACAINE-EPINEPHRINE (PF) 0.25% -1:200000 IJ SOLN
INTRAMUSCULAR | Status: AC
  Filled 2017-09-25: qty 30

## 2017-09-25 MED ORDER — GABAPENTIN 300 MG PO CAPS
300.0000 mg | ORAL_CAPSULE | ORAL | Status: AC
  Administered 2017-09-25: 300 mg via ORAL
  Filled 2017-09-25: qty 1

## 2017-09-25 MED ORDER — CELECOXIB 200 MG PO CAPS
200.0000 mg | ORAL_CAPSULE | ORAL | Status: AC
  Administered 2017-09-25: 200 mg via ORAL
  Filled 2017-09-25: qty 1

## 2017-09-25 MED ORDER — FENTANYL CITRATE (PF) 100 MCG/2ML IJ SOLN
INTRAMUSCULAR | Status: DC | PRN
  Administered 2017-09-25 (×2): 25 ug via INTRAVENOUS

## 2017-09-25 MED ORDER — BUPIVACAINE-EPINEPHRINE (PF) 0.25% -1:200000 IJ SOLN
INTRAMUSCULAR | Status: DC | PRN
Start: 2017-09-25 — End: 2017-09-25
  Administered 2017-09-25: 8 mL

## 2017-09-25 MED ORDER — 0.9 % SODIUM CHLORIDE (POUR BTL) OPTIME
TOPICAL | Status: DC | PRN
  Administered 2017-09-25: 1000 mL

## 2017-09-25 MED ORDER — FENTANYL CITRATE (PF) 250 MCG/5ML IJ SOLN
INTRAMUSCULAR | Status: AC
  Filled 2017-09-25: qty 5

## 2017-09-25 MED ORDER — HEPARIN SOD (PORK) LOCK FLUSH 100 UNIT/ML IV SOLN
INTRAVENOUS | Status: DC | PRN
  Administered 2017-09-25: 500 [IU]

## 2017-09-25 MED ORDER — ACETAMINOPHEN 500 MG PO TABS
1000.0000 mg | ORAL_TABLET | ORAL | Status: AC
  Administered 2017-09-25: 1000 mg via ORAL
  Filled 2017-09-25: qty 2

## 2017-09-25 MED ORDER — PROPOFOL 10 MG/ML IV BOLUS
INTRAVENOUS | Status: AC
  Filled 2017-09-25: qty 40

## 2017-09-25 MED ORDER — PROMETHAZINE HCL 25 MG/ML IJ SOLN: 6.2500 mg | INTRAMUSCULAR | Status: DC | PRN

## 2017-09-25 MED ORDER — DEXAMETHASONE SODIUM PHOSPHATE 10 MG/ML IJ SOLN
INTRAMUSCULAR | Status: DC | PRN
  Administered 2017-09-25: 10 mg via INTRAVENOUS

## 2017-09-25 MED ORDER — OXYCODONE HCL 5 MG PO TABS: 5.0000 mg | ORAL_TABLET | Freq: Once | ORAL | Status: DC | PRN

## 2017-09-25 SURGICAL SUPPLY — 44 items
BAG DECANTER FOR FLEXI CONT (MISCELLANEOUS) ×3 IMPLANT
BLADE CLIPPER SURG (BLADE) IMPLANT
BLADE SURG 11 STRL SS (BLADE) ×3 IMPLANT
BLADE SURG 15 STRL LF DISP TIS (BLADE) ×1 IMPLANT
BLADE SURG 15 STRL SS (BLADE) ×2
CANISTER SUCT 3000ML PPV (MISCELLANEOUS) IMPLANT
CHLORAPREP W/TINT 26ML (MISCELLANEOUS) ×3 IMPLANT
COVER SURGICAL LIGHT HANDLE (MISCELLANEOUS) ×3 IMPLANT
COVER TRANSDUCER ULTRASND GEL (DRAPE) ×3 IMPLANT
CRADLE DONUT ADULT HEAD (MISCELLANEOUS) ×3 IMPLANT
DERMABOND ADVANCED (GAUZE/BANDAGES/DRESSINGS) ×2
DERMABOND ADVANCED .7 DNX12 (GAUZE/BANDAGES/DRESSINGS) ×1 IMPLANT
DRAPE C-ARM 42X72 X-RAY (DRAPES) ×3 IMPLANT
DRAPE LAPAROSCOPIC ABDOMINAL (DRAPES) ×3 IMPLANT
DRAPE UTILITY XL STRL (DRAPES) ×6 IMPLANT
ELECT CAUTERY BLADE 6.4 (BLADE) ×3 IMPLANT
ELECT REM PT RETURN 9FT ADLT (ELECTROSURGICAL) ×3
ELECTRODE REM PT RTRN 9FT ADLT (ELECTROSURGICAL) ×1 IMPLANT
GAUZE SPONGE 4X4 16PLY XRAY LF (GAUZE/BANDAGES/DRESSINGS) ×3 IMPLANT
GLOVE EUDERMIC 7 POWDERFREE (GLOVE) ×3 IMPLANT
GOWN STRL REUS W/ TWL LRG LVL3 (GOWN DISPOSABLE) ×1 IMPLANT
GOWN STRL REUS W/ TWL XL LVL3 (GOWN DISPOSABLE) ×1 IMPLANT
GOWN STRL REUS W/TWL LRG LVL3 (GOWN DISPOSABLE) ×2
GOWN STRL REUS W/TWL XL LVL3 (GOWN DISPOSABLE) ×2
KIT BASIN OR (CUSTOM PROCEDURE TRAY) ×3 IMPLANT
KIT PORT POWER 8FR ISP CVUE (Miscellaneous) ×3 IMPLANT
KIT ROOM TURNOVER OR (KITS) ×3 IMPLANT
NEEDLE HYPO 25GX1X1/2 BEV (NEEDLE) ×6 IMPLANT
NS IRRIG 1000ML POUR BTL (IV SOLUTION) ×3 IMPLANT
PACK SURGICAL SETUP 50X90 (CUSTOM PROCEDURE TRAY) ×3 IMPLANT
PAD ARMBOARD 7.5X6 YLW CONV (MISCELLANEOUS) ×3 IMPLANT
PENCIL BUTTON HOLSTER BLD 10FT (ELECTRODE) ×3 IMPLANT
SURGILUBE 3G PEEL PACK STRL (MISCELLANEOUS) IMPLANT
SUT MNCRL AB 4-0 PS2 18 (SUTURE) ×3 IMPLANT
SUT PROLENE 2 0 CT2 30 (SUTURE) ×3 IMPLANT
SUT VIC AB 3-0 SH 18 (SUTURE) ×3 IMPLANT
SYR 10ML LL (SYRINGE) ×6 IMPLANT
SYR 5ML LUER SLIP (SYRINGE) ×3 IMPLANT
SYR CONTROL 10ML LL (SYRINGE) ×3 IMPLANT
TOWEL OR 17X24 6PK STRL BLUE (TOWEL DISPOSABLE) ×3 IMPLANT
TOWEL OR 17X26 10 PK STRL BLUE (TOWEL DISPOSABLE) ×3 IMPLANT
TUBE CONNECTING 12'X1/4 (SUCTIONS)
TUBE CONNECTING 12X1/4 (SUCTIONS) IMPLANT
YANKAUER SUCT BULB TIP NO VENT (SUCTIONS) IMPLANT

## 2017-09-25 NOTE — Interval H&P Note (Signed)
History and Physical Interval Note:  09/25/2017 6:05 AM  Tracie [name]  has presented today for surgery, with the diagnosis of Cancer right breast  The various methods of treatment have been discussed with the patient and family. After consideration of risks, benefits and other options for treatment, the patient has consented to  Procedure(s): [place] (N/A) as a surgical intervention .  The patient's history has been reviewed, patient examined, no change in status, stable for surgery.  I have reviewed the patient's chart and labs.  Questions were answered to the patient's satisfaction.     Adin Hector

## 2017-09-25 NOTE — Progress Notes (Signed)
Dentures and ring returned to pt

## 2017-09-25 NOTE — Transfer of Care (Signed)
Immediate Anesthesia Transfer of Care Note  Patient: Emmajo B Outen  Procedure(s) Performed: Procedure(s): INSERTION PORT-A-CATH WITH ULTRA SOUND ERAS PATHWAY (N/A)  Patient Location: PACU  Anesthesia Type:General  Level of Consciousness: awake, alert  and oriented  Airway & Oxygen Therapy: Patient Spontanous Breathing and Patient connected to nasal cannula oxygen  Post-op Assessment: Report given to RN and Post -op Vital signs reviewed and stable  Post vital signs: Reviewed and stable  Last Vitals:  Vitals:   09/25/17 0631  BP: 133/83  Pulse: 70  Temp: 37.1 C  SpO2: 99%    Last Pain:  Vitals:   09/25/17 0631  TempSrc: Oral      Patients Stated Pain Goal: 0 (34/96/11 6435)  Complications: No apparent anesthesia complications

## 2017-09-25 NOTE — Anesthesia Preprocedure Evaluation (Signed)
Anesthesia Evaluation  Patient identified by MRN, date of birth, ID band Patient awake    Reviewed: Allergy & Precautions, NPO status , Patient's Chart, lab work & pertinent test results  Airway Mallampati: II  TM Distance: >3 FB Neck ROM: Full    Dental  (+) Edentulous Upper, Missing   Pulmonary asthma , Current Smoker,    Pulmonary exam normal breath sounds clear to auscultation       Cardiovascular hypertension, + angina Normal cardiovascular exam Rhythm:Regular Rate:Normal     Neuro/Psych  Headaches, negative neurological ROS  negative psych ROS   GI/Hepatic negative GI ROS, Neg liver ROS,   Endo/Other  Hypothyroidism   Renal/GU negative Renal ROS     Musculoskeletal negative musculoskeletal ROS (+)   Abdominal   Peds  Hematology negative hematology ROS (+)   Anesthesia Other Findings   Reproductive/Obstetrics negative OB ROS                             Anesthesia Physical Anesthesia Plan  ASA: III  Anesthesia Plan: General   Post-op Pain Management:    Induction: Intravenous  PONV Risk Score and Plan: 2 and Ondansetron and Dexamethasone  Airway Management Planned: LMA  Additional Equipment:   Intra-op Plan:   Post-operative Plan: Extubation in OR  Informed Consent: I have reviewed the patients History and Physical, chart, labs and discussed the procedure including the risks, benefits and alternatives for the proposed anesthesia with the patient or authorized representative who has indicated his/her understanding and acceptance.   Dental advisory given  Plan Discussed with: CRNA  Anesthesia Plan Comments:         Anesthesia Quick Evaluation

## 2017-09-25 NOTE — Anesthesia Procedure Notes (Signed)
Procedure Name: LMA Insertion Date/Time: 09/25/2017 7:43 AM Performed by: Candis Shine Pre-anesthesia Checklist: Patient identified, Emergency Drugs available, Suction available and Patient being monitored Patient Re-evaluated:Patient Re-evaluated prior to induction Oxygen Delivery Method: Circle System Utilized Preoxygenation: Pre-oxygenation with 100% oxygen Induction Type: IV induction Ventilation: Mask ventilation without difficulty LMA: LMA inserted LMA Size: 4.0 Number of attempts: 1 Placement Confirmation: positive ETCO2 Tube secured with: Tape Dental Injury: Teeth and Oropharynx as per pre-operative assessment

## 2017-09-25 NOTE — Op Note (Signed)
Patient Name:           Tracie [name]   Date of Surgery:        09/25/2017  Pre op Diagnosis:      Cancer right breast upper outer quadrant   Post op Diagnosis:    same  Procedure:                 Insertion PowerPort clear view 8 French tunneled venous vascular access device                                      Use of fluoroscopy for guidance and positioning                                       Ultrasound-guided venipuncture  Surgeon:                     Edsel Petrin. Dalbert Batman, M.D., FACS  Assistant:                      Or staff   Indication for Assistant: N/A  Operative Indications:    This is a 66 year old female, referred by [facility] imaging for evaluation and management of a locally advanced cancer of the right breast, upper outer quadrant. Her PCP is Precious Haws. Her cardiologist has been Dr. Einar Gip. She was seen in the [facility]  by Dr. Jana Hakim, Dr. Isidore Moos, and me.      She has no prior breast problems. Last mammogram 2015. She saw some skin dimpling and felt a small lump in the upper outer right breast and went for imaging studies. Imaging shows a discrete mass in the far upper outer quadrant under the skin, 2.1 cm. Ultrasound shows 2 abnormal lymph nodes. Biopsy of the right breast mass shows invasive ductal carcinoma, receptor positive, HER-2 negative, Ki-67 90%. Grade 3. Biopsy of the lymph node shows metastatic cancer. Consensus opinion in conference  is that she should undergo staging scans, MRI, neoadjuvant chemotherapy ...the patient is in agreement with this..       Family history negative for breast or ovarian cancer. Mother died of metastatic rectal cancer.      We had a very long talk about treatment plan in general as well as surgical options.  She is in favor of neoadjuvant chemotherapy to see if we can downstage the tumor and to complete systemic therapy.    She will be Scheduled for insertion of Port-A-Cath. I have discussed the indications, details, techniques,  and risk of the surgery in detail. She is aware the risk of bleeding, infection, bilateral attempts, failure to insert, pneumothorax requiring reexpansion of lung, air embolus, and other unforeseen problems. She understands all these issues. All of her questions are answered and she agrees with this plan.      Chemotherapy to start October 4  Operative Findings:       Left subclavian venipuncture was attempted but was unsuccessful at blood return.  There was almostbetween the clavicle and the first rib.  Ultrasound-guided right internal jugular venipuncture was performed but initially got pulsatile arterial blood and we pulled out our instruments and held pressure for a few minutes.  There was no hematoma.  A second attempt at ultrasound-guided right internal jugular venipuncture was easily successful .  At the completion of the case the catheter tip appeared to be well positioned right at the SVC and right atrial junction.  We had excellent blood return and flushed easily.  Procedure in Detail:          Following the induction of general LMA anesthesia the patient was positioned with a small roll behind her shoulders and arms at her sides.  The neck and chest was prepped and draped in a sterile fashion.  Surgical timeout was performed.  Intravenous antibiotics were given.  0.5% Marcaine with epinephrine was used as local infiltration anesthetic.     A left subclavian venipuncture was attempted.  3 attempts were made but actually no blood was obtained.  I tried several different positions but there appeared to be bony obstruction, theoretically due to small body habitus anatomically.  The left side was abandoned.      Using ultrasound guidance I performed a right internal jugular venipuncture but I got arterial blood and the  instruments were removed and held pressure for a few minutes.  No hematoma.  A second ultrasound-guided internal jugular venipuncture was performed on a single pass and the guidewire  inserted into the superior vena cava under fluoroscopic guidance.  Small incision was made at the wire insertion site.  Using the C-arm I drew a template on the chest wall to guide catheter length and positioning.  A transverse incision was made in the right infraclavicular area.  A subcutaneous pocket was created.  Using a tunneling device I passed the catheter from the port pocket site to the wire insertion site.  I then cut the catheter 24 cm in length.  The catheter was secured to the port with the locking device and the port and catheter flushed with heparinized saline.  The port was sutured to the pectoralis  fascia with 3 interrupted sutures of 2-0 Prolene.  The dilator and peel-away sheath assembly were inserted easily over the guidewire.  The dilator and wire were removed.  The catheter threaded easily and the peel-away sheath removed.  The catheter flushed easily and had excellent blood return.  Fluoroscopy confirmed that the catheter tip was in the superior vena cava right at the right atrial junction.  There was no arrhythmia.  We flush the port and catheter with concentrated  heparin.  Subcutaneous tissues were closed with 3-0 Vicryl sutures and the skin incisions were closed with subcuticular 4-0 Monocryl and Dermabond.  Patient tolerated the procedure well was taken to PACU in stable condition.  EBL 20 mL.  Counts correct.  Complications none.     Edsel Petrin. Dalbert Batman, M.D., FACS General and Minimally Invasive Surgery Breast and Colorectal Surgery   Addendum: I logged onto the [facility] website and reviewed her prescription medication history   09/25/2017 8:50 AM

## 2017-09-25 NOTE — OR Nursing (Signed)
Removed silver circular diamond ring from left hand. Placed in bag with patient label and placed on patients chart.

## 2017-09-25 NOTE — Discharge Instructions (Signed)
    PORT-A-CATH: POST OP INSTRUCTIONS  Always review your discharge instruction sheet given to you by the facility where your surgery was performed.   1. A prescription for pain medication may be given to you upon discharge. Take your pain medication as prescribed, if needed. If narcotic pain medicine is not needed, then you make take acetaminophen (Tylenol) or ibuprofen (Advil) as needed.  2. Take your usually prescribed medications unless otherwise directed. 3. If you need a refill on your pain medication, please contact our office. All narcotic pain medicine now requires a paper prescription.  Phoned in and fax refills are no longer allowed by law.  Prescriptions will not be filled after 5 pm or on weekends.  4. You should follow a light diet for the remainder of the day after your procedure. 5. Most patients will experience some mild swelling and/or bruising in the area of the incision. It may take several days to resolve. 6. It is common to experience some constipation if taking pain medication after surgery. Increasing fluid intake and taking a stool softener (such as Colace) will usually help or prevent this problem from occurring. A mild laxative (Milk of Magnesia or Miralax) should be taken according to package directions if there are no bowel movements after 48 hours.  7. Unless discharge instructions indicate otherwise, you may remove your bandages 48 hours after surgery, and you may shower at that time. You may have steri-strips (small white skin tapes) in place directly over the incision.  These strips should be left on the skin for 7-10 days.  If your surgeon used Dermabond (skin glue) on the incision, you may shower in 24 hours.  The glue will flake off over the next 2-3 weeks.  8. If your port is left accessed at the end of surgery (needle left in port), the dressing cannot get wet and should only by changed by a healthcare professional. When the port is no longer accessed (when the  needle has been removed), follow step 7.   9. ACTIVITIES:  Limit activity involving your arms for the next 72 hours. Do no strenuous exercise or activity for 1 week. You may drive when you are no longer taking prescription pain medication, you can comfortably wear a seatbelt, and you can maneuver your car. 10.You may need to see your doctor in the office for a follow-up appointment.  Please       check with your doctor.  11.When you receive a new Port-a-Cath, you will get a product guide and        ID card.  Please keep them in case you need them.  WHEN TO CALL YOUR DOCTOR ([phone]): 1. Fever over 101.0 2. Chills 3. Continued bleeding from incision 4. Increased redness and tenderness at the site 5. Shortness of breath, difficulty breathing   The clinic staff is available to answer your questions during regular business hours. Please don't hesitate to call and ask to speak to one of the nurses or medical assistants for clinical concerns. If you have a medical emergency, go to the nearest emergency room or call 911.  A surgeon from [facility] is always on call at the hospital.     For further information, please visit [email or web address]      

## 2017-09-27 ENCOUNTER — Encounter (HOSPITAL_COMMUNITY): Payer: Self-pay | Admitting: General Surgery

## 2017-09-29 ENCOUNTER — Telehealth: Payer: Self-pay

## 2017-09-29 ENCOUNTER — Ambulatory Visit (HOSPITAL_COMMUNITY)
Admission: RE | Admit: 2017-09-29 | Discharge: 2017-09-29 | Disposition: A | Discharge status: Home / Self Care | Payer: 59 | Source: Ambulatory Visit | Attending: Adult Health | Admitting: Adult Health

## 2017-09-29 ENCOUNTER — Other Ambulatory Visit: Payer: 59

## 2017-09-29 ENCOUNTER — Ambulatory Visit (HOSPITAL_BASED_OUTPATIENT_CLINIC_OR_DEPARTMENT_OTHER): Payer: 59 | Admitting: Adult Health

## 2017-09-29 VITALS — BP 142/84 | HR 87 | Temp 98.1°F | Resp 18 | Ht 64.0 in | Wt 182.1 lb

## 2017-09-29 DIAGNOSIS — C50411 Malignant neoplasm of upper-outer quadrant of right female breast: Secondary | ICD-10-CM | POA: Diagnosis not present

## 2017-09-29 DIAGNOSIS — C773 Secondary and unspecified malignant neoplasm of axilla and upper limb lymph nodes: Secondary | ICD-10-CM | POA: Diagnosis not present

## 2017-09-29 DIAGNOSIS — M19022 Primary osteoarthritis, left elbow: Secondary | ICD-10-CM | POA: Diagnosis not present

## 2017-09-29 DIAGNOSIS — M899 Disorder of bone, unspecified: Secondary | ICD-10-CM | POA: Diagnosis not present

## 2017-09-29 DIAGNOSIS — Z170 Estrogen receptor positive status [ER+]: Secondary | ICD-10-CM

## 2017-09-29 DIAGNOSIS — N83209 Unspecified ovarian cyst, unspecified side: Secondary | ICD-10-CM | POA: Diagnosis not present

## 2017-09-29 DIAGNOSIS — M50323 Other cervical disc degeneration at C6-C7 level: Secondary | ICD-10-CM | POA: Diagnosis not present

## 2017-09-29 DIAGNOSIS — N839 Noninflammatory disorder of ovary, fallopian tube and broad ligament, unspecified: Secondary | ICD-10-CM

## 2017-09-29 DIAGNOSIS — I6522 Occlusion and stenosis of left carotid artery: Secondary | ICD-10-CM | POA: Insufficient documentation

## 2017-09-29 NOTE — Telephone Encounter (Signed)
Patient was concerned about a call saying her schedule time have changed. Verified scheduled time. Per 10/2 phone que.

## 2017-09-30 ENCOUNTER — Other Ambulatory Visit: Payer: Medicare Other

## 2017-09-30 ENCOUNTER — Other Ambulatory Visit: Payer: Self-pay

## 2017-09-30 ENCOUNTER — Encounter: Payer: Self-pay | Admitting: Adult Health

## 2017-09-30 ENCOUNTER — Telehealth: Payer: Self-pay

## 2017-09-30 ENCOUNTER — Encounter: Payer: Medicare Other | Admitting: Genetic Counselor

## 2017-09-30 ENCOUNTER — Other Ambulatory Visit: Payer: Self-pay | Admitting: Adult Health

## 2017-09-30 DIAGNOSIS — C50411 Malignant neoplasm of upper-outer quadrant of right female breast: Secondary | ICD-10-CM

## 2017-09-30 DIAGNOSIS — Z170 Estrogen receptor positive status [ER+]: Principal | ICD-10-CM

## 2017-09-30 DIAGNOSIS — C7951 Secondary malignant neoplasm of bone: Secondary | ICD-10-CM

## 2017-09-30 NOTE — Telephone Encounter (Signed)
Spoke with patient about xray results.  Let her know that they were consistent with degeneration as she and NP had discussed.  Patient voiced understanding and stated she would discuss further at next appt.

## 2017-09-30 NOTE — Progress Notes (Signed)
[place]  [phone] [phone]     ID: JREAM BROYLES DOB: September 22, 1951  MR#: [id number]  IRW#:[id number]  Patient Care Team: Bartholome Bill, MD as PCP - General (Family Medicine) [name], MD as Consulting Physician (General Surgery) Magrinat, [name], MD as Consulting Physician (Oncology) [name], MD as Attending Physician (Radiation Oncology) Scot Dock, NP OTHER MD:  CHIEF COMPLAINT: Estrogen receptor positive breast cancer  CURRENT TREATMENT: Neoadjuvant chemotherapy/immunotherapy.   HISTORY OF CURRENT ILLNESS: Tracie [name] noted a change in her right breast sometime in July 2018. [facility] to report that she had found a "kernel" in her breast. She was advised to see her primary physician which she did. The patient was then set up for bilateral diagnostic mammography with tomography and right breast ultrasonography at [facility] 09/03/2017. The breast density was category I a. In the upper outer quadrant of the right breast there was a 2.1 cm high density mass, which was palpable. Also in the right breast more posteriorly there was a 1.5 cm high density mass which was felt to be a suspicious lymph node. Ultrasound of the right breast confirmed a 2.1 centimeter lobulated solid mass in the right breast upper outer quadrant 15.8 cm from the nipple in the 10:00 radiant. There was a 1.5 cm oval mass in the right axillary tail with a small rounded masses also suspicious for lymph node involvement. A total of 3 suspicious lymph nodes were identified.  On 09/03/2017 the patient underwent biopsy of the breast mass and the suspicious right axillary lymph node. The final pathology ([id number]) found invasive ductal carcinoma, grade 3, in both. Both tumors were estrogen receptor positive at 70-75%, and both were progesterone receptor negative. Both had an elevated proliferation marker at 90%. The mass in the breast was HER-2  negative, with a signals ratio of 1.25 and the number per cell 1.88. The lymph node mass however was HER-2 positive with a signals ratio of 2.57, and the number per cell 3.60.  The patient's subsequent history is as detailed below.  INTERVAL HISTORY: Tracie [name] is here today for an appointment to review her scan results and to discuss her breast cancer treatment for her HER-2 positive breast cancer.  She is doing moderately well today.  She did inform me that while she wants the overall picture of her health and where her breast cancer is, she does not want to be told any numbers or statistics, such as estimations of survival.    REVIEW OF SYSTEMS: Tracie [name] has some mild upper back pain.  She wants me to look at some mild bruising and swelling from her attempted port placement.  It isn't particularly bothering her, she just wants to know what to do about it.  She denies any other issues today and a detailed ROS is non contributory.    PAST MEDICAL HISTORY: Past Medical History:  Diagnosis Date  . Angina pectoris ([place])   . Asthma   . Cancer ([place])    right breast  . Constipation   . Diverticulitis   . Headache   . HPV in female   . Hyperlipidemia   . Hypertension   . Hypothyroidism   . Wears dentures   . Wears glasses     PAST SURGICAL HISTORY: Past Surgical History:  Procedure Laterality Date  . CESAREAN SECTION    . COLON SURGERY  2012   sigmoidectomy  . COLOSTOMY    . COLOSTOMY TAKEDOWN  10/06/11  . PORTACATH PLACEMENT N/A 09/25/2017   Procedure: INSERTION PORT-A-CATH WITH ULTRA SOUND ERAS PATHWAY;  Surgeon: [name], MD;  Location: [place];  Service: General;  Laterality: N/A;  . TONSILLECTOMY    . TUBAL LIGATION      FAMILY HISTORY Family History  Problem Relation Age of Onset  . Cancer Mother        rectal  . Hypertension Brother   . Hypertension Brother   . Asthma Son   . Asthma Brother   The patient has little information regarding her father. Her mother died  at age 28 from a strange cancer--the patient does not know what it was, it may well have been cervical cancer from her description. The patient has one brother, no sisters. There is no history of breast or ovarian cancer in the family to the patient's knowledge   GYNECOLOGIC HISTORY:  No LMP recorded. Patient is postmenopausal.  menarche age 66, first live birth age 39 the patient is Tracie [name]. She stopped having periods at age 34. She never used oral contraceptives or hormone replacement.   SOCIAL HISTORY:  General works as a Scientist, product/process development. She is divorced. Currently her son [name] lives with her. He is a Art gallery manager. The 2 other children are ANJOLAOLUWA SIGUENZA, who lives in [place] and works in the theater and media, and Bryelle Spiewak lives in [place], and is vice president of a [employer]. The patient has 5 grandchildren. She is a Psychologist, forensic.     ADVANCED DIRECTIVES: Not in place    HEALTH MAINTENANCE: Social History  Substance Use Topics  . Smoking status: Current Every Day Smoker    Packs/day: 0.50    Years: 40.00    Types: Cigarettes  . Smokeless tobacco: Never Used     Comment: Pt already has info  . Alcohol use Yes     Comment: occasional     Colonoscopy: September 2012   PAP:  Bone density:09/03/2017    Allergies  Allergen Reactions  . Fish Allergy Anaphylaxis and Swelling    Swelling of hands and feet.  . Peanut-Containing Drug Products Anaphylaxis    Tree nuts included in allergic reaction.  . Ace Inhibitors Other (See Comments) and Cough    Cold like symptoms     Current Outpatient Prescriptions  Medication Sig Dispense Refill  . albuterol (VENTOLIN HFA) 108 (90 BASE) MCG/ACT inhaler Inhale 1-2 puffs into the lungs every 6 (six) hours as needed for wheezing or shortness of breath. 1 Inhaler 6  . Alirocumab (PRALUENT) 75 MG/ML SOPN Inject 75 mg into the muscle every 14 (fourteen) days.     Marland Kitchen amLODipine (NORVASC) 10 MG tablet Take 10  mg by mouth daily.     . cetirizine (ZYRTEC) 10 MG tablet Take 10 mg by mouth as needed for allergies.     Marland Kitchen dexamethasone (DECADRON) 4 MG tablet Take 2 tablets (8 mg total) by mouth 2 (two) times daily. Start the day before Taxotere. Then again the day after chemo for 3 days. 30 tablet 1  . HYDROcodone-acetaminophen (NORCO) 5-325 MG tablet Take 1-2 tablets by mouth every 6 (six) hours as needed for moderate pain or severe pain. 20 tablet 0  . levothyroxine (SYNTHROID, LEVOTHROID) 50 MCG tablet Take 50 mcg by mouth daily.    Marland Kitchen lidocaine-prilocaine (EMLA) cream Apply to affected area once 30 g 3  . LORazepam (ATIVAN) 0.5 MG tablet Take 1 tablet (0.5 mg total) by mouth  at bedtime as needed (Nausea or vomiting). 30 tablet 0  . losartan-hydrochlorothiazide (HYZAAR) 100-12.5 MG tablet Take 1 tablet by mouth daily.   3  . metoprolol succinate (TOPROL-XL) 50 MG 24 hr tablet Take 50 mg by mouth daily.   6  . mometasone-formoterol (DULERA) 100-5 MCG/ACT AERO Inhale 2 puffs into the lungs 2 (two) times daily. 3 Inhaler 0  . nitroGLYCERIN (NITROSTAT) 0.4 MG SL tablet Place 0.4 mg under the tongue every 5 (five) minutes as needed for chest pain.    Marland Kitchen prochlorperazine (COMPAZINE) 10 MG tablet Take 1 tablet (10 mg total) by mouth every 6 (six) hours as needed (Nausea or vomiting). 30 tablet 1   No current facility-administered medications for this visit.     OBJECTIVE: Middle-aged African-American woman in no acute distress   Vitals:   09/29/17 1235  BP: (!) 142/84  Pulse: 87  Resp: 18  Temp: 98.1 F (36.7 C)  SpO2: 98%     Body mass index is 31.26 kg/m.   Wt Readings from Last 3 Encounters:  09/29/17 182 lb 1.6 oz (82.6 kg)  09/25/17 184 lb (83.5 kg)  09/23/17 184 lb 4.8 oz (83.6 kg)      ECOG FS:1 - Symptomatic but completely ambulatory GENERAL: Patient is a well appearing female in no acute distress HEENT:  Sclerae anicteric.  Oropharynx clear and moist. No ulcerations or evidence of  oropharyngeal candidiasis. Neck is supple.  NODES:  No cervical, supraclavicular, or axillary lymphadenopathy palpated.  BREAST EXAM:  Deferred. LUNGS:  Clear to auscultation bilaterally.  No wheezes or rhonchi. HEART:  Regular rate and rhythm. No murmur appreciated. Chest wall: Left chest wall with ecchymosis and swelling from attempted port placement, right chest wall with port, healing well ABDOMEN:  Soft, nontender.  Positive, normoactive bowel sounds. No organomegaly palpated. MSK:  No focal spinal tenderness to palpation. Full range of motion bilaterally in the upper extremities. EXTREMITIES:  No peripheral edema.   SKIN:  Clear with no obvious rashes or skin changes. No nail dyscrasia. NEURO:  Nonfocal. Well oriented.  Appropriate affect.     LAB RESULTS:  CMP     Component Value Date/Time   NA 137 09/23/2017 1414   NA 138 09/09/2017 0830   K 3.2 (L) 09/23/2017 1414   K 3.8 09/09/2017 0830   CL 102 09/23/2017 1414   CO2 26 09/23/2017 1414   CO2 26 09/09/2017 0830   GLUCOSE 98 09/23/2017 1414   GLUCOSE 106 09/09/2017 0830   BUN 9 09/23/2017 1414   BUN 13.4 09/09/2017 0830   CREATININE 0.84 09/23/2017 1414   CREATININE 0.9 09/09/2017 0830   CALCIUM 9.5 09/23/2017 1414   CALCIUM 9.9 09/09/2017 0830   PROT 7.4 09/09/2017 0830   ALBUMIN 3.6 09/09/2017 0830   AST 15 09/09/2017 0830   ALT 15 09/09/2017 0830   ALKPHOS 124 09/09/2017 0830   BILITOT <0.22 09/09/2017 0830   GFRNONAA >60 09/23/2017 1414   GFRAA >60 09/23/2017 1414    No results found for: TOTALPROTELP, ALBUMINELP, A1GS, A2GS, BETS, BETA2SER, GAMS, MSPIKE, SPEI  No results found for: KPAFRELGTCHN, LAMBDASER, KAPLAMBRATIO  Lab Results  Component Value Date   WBC 6.1 09/23/2017   NEUTROABS 2.9 09/09/2017   HGB 12.5 09/23/2017   HCT 38.5 09/23/2017   MCV 86.7 09/23/2017   PLT 335 09/23/2017      Chemistry      Component Value Date/Time   NA 137 09/23/2017 1414   NA 138 09/09/2017 0830  K 3.2  (L) 09/23/2017 1414   K 3.8 09/09/2017 0830   CL 102 09/23/2017 1414   CO2 26 09/23/2017 1414   CO2 26 09/09/2017 0830   BUN 9 09/23/2017 1414   BUN 13.4 09/09/2017 0830   CREATININE 0.84 09/23/2017 1414   CREATININE 0.9 09/09/2017 0830      Component Value Date/Time   CALCIUM 9.5 09/23/2017 1414   CALCIUM 9.9 09/09/2017 0830   ALKPHOS 124 09/09/2017 0830   AST 15 09/09/2017 0830   ALT 15 09/09/2017 0830   BILITOT <0.22 09/09/2017 0830       No results found for: LABCA2  No components found for: ZSWFUX323  No results for input(s): INR in the last 168 hours.  No results found for: LABCA2  No results found for: FTD322  No results found for: GUR427  No results found for: CWC376  No results found for: CA2729  No components found for: HGQUANT  No results found for: CEA1 / No results found for: CEA1   No results found for: AFPTUMOR  No results found for: CHROMOGRNA  No results found for: PSA1  No visits with results within 3 Day(s) from this visit.  Latest known visit with results is:  Hospital Outpatient Visit on 09/23/2017  Component Date Value Ref Range Status  . WBC 09/23/2017 6.1  4.0 - 10.5 K/uL Final  . RBC 09/23/2017 4.44  3.87 - 5.11 MIL/uL Final  . Hemoglobin 09/23/2017 12.5  12.0 - 15.0 g/dL Final  . HCT 09/23/2017 38.5  36.0 - 46.0 % Final  . MCV 09/23/2017 86.7  78.0 - 100.0 fL Final  . MCH 09/23/2017 28.2  26.0 - 34.0 pg Final  . MCHC 09/23/2017 32.5  30.0 - 36.0 g/dL Final  . RDW 09/23/2017 17.0* 11.5 - 15.5 % Final  . Platelets 09/23/2017 335  150 - 400 K/uL Final  . Sodium 09/23/2017 137  135 - 145 mmol/L Final  . Potassium 09/23/2017 3.2* 3.5 - 5.1 mmol/L Final  . Chloride 09/23/2017 102  101 - 111 mmol/L Final  . CO2 09/23/2017 26  22 - 32 mmol/L Final  . Glucose, Bld 09/23/2017 98  65 - 99 mg/dL Final  . BUN 09/23/2017 9  6 - 20 mg/dL Final  . Creatinine, Ser 09/23/2017 0.84  0.44 - 1.00 mg/dL Final  . Calcium 09/23/2017 9.5  8.9 - 10.3  mg/dL Final  . GFR calc non Af Amer 09/23/2017 >60  >60 mL/min Final  . GFR calc Af Amer 09/23/2017 >60  >60 mL/min Final   Comment: (NOTE) The eGFR has been calculated using the CKD EPI equation. This calculation has not been validated in all clinical situations. eGFR's persistently <60 mL/min signify possible Chronic Kidney Disease.   . Anion gap 09/23/2017 9  5 - 15 Final    (this displays the last labs from the last 3 days)  No results found for: TOTALPROTELP, ALBUMINELP, A1GS, A2GS, BETS, BETA2SER, GAMS, MSPIKE, SPEI (this displays SPEP labs)  No results found for: KPAFRELGTCHN, LAMBDASER, KAPLAMBRATIO (kappa/lambda light chains)  No results found for: HGBA, HGBA2QUANT, HGBFQUANT, HGBSQUAN (Hemoglobinopathy evaluation)   No results found for: LDH  No results found for: IRON, TIBC, IRONPCTSAT (Iron and TIBC)  No results found for: FERRITIN  Urinalysis    Component Value Date/Time   LABSPEC 1.020 02/10/2011 2058   PHURINE 6.0 02/10/2011 2058   HGBUR LARGE (A) 02/10/2011 2058   BILIRUBINUR MODERATE (A) 02/10/2011 2058   KETONESUR 15 (A) 02/10/2011 2831  PROTEINUR >=300 (A) 02/10/2011 2058   UROBILINOGEN >=8.0 02/10/2011 2058   NITRITE NEGATIVE 02/10/2011 2058   LEUKOCYTESUR  02/10/2011 2058    NEGATIVE Biochemical Testing Only. Please order routine urinalysis from main lab if confirmatory testing is needed.     STUDIES:  outside studies discussed with patient   ELIGIBLE FOR AVAILABLE RESEARCH PROTOCOL: no  ASSESSMENT: 66 y.o. [place] woman status post right breast upper outer quadrant and right axillary lymph node biopsy 09/03/2017, both positive for invasive ductal carcinoma, grade 3, estrogen receptor positive, progesterone receptor negative, with an MIB-1 of 90%, the lymph node being HER-2 positive, the breast mass HER-2 negative  (1) neoadjuvant chemotherapy with carboplatin, docetaxel, trastuzumab, and pertuzumab to start 10/01/2017  (2) trastuzumab to  continue to complete 12 months  (3) definitive surgery to follow chemotherapy  (4) adjuvant radiation to follow  (5) anti-estrogens to start at the completion of local treatment  PLAN: Tracie [name] is doing well today.  I reviewed her scans with her today.  She has some non specific uptake in her bone scan in her cervical spine and elbow that need radiographic correlation.  I will order this today.  The most concerning thing I reviewed with her is the single metastatic appearing lesion on T8.  She would be a candidate for biopsy and osteocool procedure.  She is not particularly symptomatic at this lesion, however on the scan the tumor appears to be breaking the cortex.  I discussed this patient and details with Ronny Bacon, MD in interventional radiology.  He is helping [place] to streamline the process to get the patient in to meet with her, discuss biopsy of this lesion, and discuss the osteocool procedure with her.  I briefly discussed this with Tracie [name] and she is agreeable.  She will also need transvaginal ultrasound to look at a cystic lesion on her ovary that is questionably an old  I also reviewed with her the echocardiogram.  I reviewed that her EF was 50-55% (ok to proceed with treatment with Trastuzumab and Pertuzumab per Dr. Lindi Adie).  I reviewed with her that I would send her to Dr. Aundra Dubin in cardiology to follow her closely to help prevent and closely monitor her heart while she receives her Trastuzumab and Pertuzumab.  She and I reviewed her chemotherapy in detail.  We reviewed possible side effects.  She and I reviewed her anti-emetic road map and I gave her a copy of this today.  I talked to her about her port.  She will apply ice to her bruising at her right chest wall if needed, from the attempted port placement.  I gave her reassurance about her scans.  She will return on 10/01/2017 for labs and chemotherapy.  She will receive Neulasta in the form of Onpro.  She will return on 10/12 for labs and follow  up with Dr. Jana Hakim.   This plan was reviewed in its entirety with Dr. Lindi Adie and he helped to formulate it.  After discussion with the patient, she does not feel like she needs to meet with him today and will see Dr. Jana Hakim next week to discuss.     Tracie [name] has a good understanding of the overall plan. She agrees with it.  She will call with any problems that may develop before her next visit here.  A total of (60) minutes of face-to-face time was spent with this patient with greater than 50% of that time in counseling and care-coordination.   Scot Dock, NP  09/30/2017 8:41 AM Medical Oncology and Hematology [facility] [address] [place], [place] [address] Tel. [phone]    Fax. [phone]

## 2017-09-30 NOTE — Progress Notes (Signed)
Would get an echo at 6 weeks with followup.   Heather/Katrina, can you arrange?

## 2017-10-01 ENCOUNTER — Encounter: Payer: Self-pay | Admitting: *Deleted

## 2017-10-01 ENCOUNTER — Other Ambulatory Visit: Payer: Self-pay | Admitting: *Deleted

## 2017-10-01 ENCOUNTER — Ambulatory Visit (HOSPITAL_BASED_OUTPATIENT_CLINIC_OR_DEPARTMENT_OTHER): Payer: 59 | Admitting: Medical

## 2017-10-01 ENCOUNTER — Ambulatory Visit (HOSPITAL_BASED_OUTPATIENT_CLINIC_OR_DEPARTMENT_OTHER): Payer: 59

## 2017-10-01 ENCOUNTER — Other Ambulatory Visit (HOSPITAL_BASED_OUTPATIENT_CLINIC_OR_DEPARTMENT_OTHER): Payer: 59

## 2017-10-01 VITALS — BP 145/65 | HR 73 | Temp 98.7°F | Resp 17

## 2017-10-01 DIAGNOSIS — Z5112 Encounter for antineoplastic immunotherapy: Secondary | ICD-10-CM | POA: Diagnosis not present

## 2017-10-01 DIAGNOSIS — Z5111 Encounter for antineoplastic chemotherapy: Secondary | ICD-10-CM

## 2017-10-01 DIAGNOSIS — Z170 Estrogen receptor positive status [ER+]: Secondary | ICD-10-CM | POA: Diagnosis not present

## 2017-10-01 DIAGNOSIS — C50411 Malignant neoplasm of upper-outer quadrant of right female breast: Secondary | ICD-10-CM | POA: Diagnosis not present

## 2017-10-01 DIAGNOSIS — R202 Paresthesia of skin: Secondary | ICD-10-CM

## 2017-10-01 LAB — COMPREHENSIVE METABOLIC PANEL
ALK PHOS: 97 U/L (ref 40–150)
ALT: 19 U/L (ref 0–55)
ANION GAP: 11 meq/L (ref 3–11)
AST: 12 U/L (ref 5–34)
Albumin: 3.7 g/dL (ref 3.5–5.0)
BUN: 15.3 mg/dL (ref 7.0–26.0)
CO2: 24 mEq/L (ref 22–29)
Calcium: 10.3 mg/dL (ref 8.4–10.4)
Chloride: 100 mEq/L (ref 98–109)
Creatinine: 0.9 mg/dL (ref 0.6–1.1)
EGFR: 81 mL/min/{1.73_m2} — AB (ref >90)
GLUCOSE: 191 mg/dL — AB (ref 70–140)
POTASSIUM: 3.7 meq/L (ref 3.5–5.1)
Sodium: 135 mEq/L — ABNORMAL LOW (ref 136–145)
TOTAL PROTEIN: 7.8 g/dL (ref 6.4–8.3)
Total Bilirubin: 0.26 mg/dL (ref 0.20–1.20)

## 2017-10-01 LAB — CBC WITH DIFFERENTIAL/PLATELET
BASO%: 0.1 % (ref 0.0–2.0)
Basophils Absolute: 0 10*3/uL (ref 0.0–0.1)
EOS%: 0.1 % (ref 0.0–7.0)
Eosinophils Absolute: 0 10*3/uL (ref 0.0–0.5)
HEMATOCRIT: 39.3 % (ref 34.8–46.6)
HEMOGLOBIN: 13 g/dL (ref 11.6–15.9)
LYMPH#: 1.4 10*3/uL (ref 0.9–3.3)
LYMPH%: 16.6 % (ref 14.0–49.7)
MCH: 28.6 pg (ref 25.1–34.0)
MCHC: 33.1 g/dL (ref 31.5–36.0)
MCV: 86.4 fL (ref 79.5–101.0)
MONO#: 0.5 10*3/uL (ref 0.1–0.9)
MONO%: 6.1 % (ref 0.0–14.0)
NEUT%: 77.1 % — ABNORMAL HIGH (ref 38.4–76.8)
NEUTROS ABS: 6.7 10*3/uL — AB (ref 1.5–6.5)
Platelets: 337 10*3/uL (ref 145–400)
RBC: 4.55 10*6/uL (ref 3.70–5.45)
RDW: 16.5 % — AB (ref 11.2–14.5)
WBC: 8.7 10*3/uL (ref 3.9–10.3)

## 2017-10-01 MED ORDER — SODIUM CHLORIDE 0.9 % IV SOLN
8.0000 mg/kg | Freq: Once | INTRAVENOUS | Status: AC
  Administered 2017-10-01: 672 mg via INTRAVENOUS
  Filled 2017-10-01: qty 32

## 2017-10-01 MED ORDER — DEXAMETHASONE SODIUM PHOSPHATE 10 MG/ML IJ SOLN
10.0000 mg | Freq: Once | INTRAMUSCULAR | Status: AC
  Administered 2017-10-01: 10 mg via INTRAVENOUS

## 2017-10-01 MED ORDER — LORATADINE 10 MG PO TABS: 10.0000 mg | ORAL_TABLET | Freq: Every day | ORAL | 3 refills | Status: DC

## 2017-10-01 MED ORDER — DIPHENHYDRAMINE HCL 25 MG PO CAPS
25.0000 mg | ORAL_CAPSULE | Freq: Once | ORAL | Status: AC
Start: 2017-10-01 — End: 2017-10-01
  Administered 2017-10-01: 25 mg via ORAL

## 2017-10-01 MED ORDER — PALONOSETRON HCL INJECTION 0.25 MG/5ML
INTRAVENOUS | Status: AC
  Filled 2017-10-01: qty 5

## 2017-10-01 MED ORDER — SODIUM CHLORIDE 0.9 % IV SOLN
485.5000 mg | Freq: Once | INTRAVENOUS | Status: AC
  Administered 2017-10-01: 490 mg via INTRAVENOUS
  Filled 2017-10-01: qty 49

## 2017-10-01 MED ORDER — ACETAMINOPHEN 325 MG PO TABS
ORAL_TABLET | ORAL | Status: AC
  Filled 2017-10-01: qty 2

## 2017-10-01 MED ORDER — SODIUM CHLORIDE 0.9 % IV SOLN
Freq: Once | INTRAVENOUS | Status: AC
  Administered 2017-10-01: 10:00:00 via INTRAVENOUS

## 2017-10-01 MED ORDER — PALONOSETRON HCL INJECTION 0.25 MG/5ML
0.2500 mg | Freq: Once | INTRAVENOUS | Status: AC
  Administered 2017-10-01: 0.25 mg via INTRAVENOUS

## 2017-10-01 MED ORDER — PEGFILGRASTIM 6 MG/0.6ML ~~LOC~~ PSKT
6.0000 mg | PREFILLED_SYRINGE | Freq: Once | SUBCUTANEOUS | Status: AC
  Administered 2017-10-01: 6 mg via SUBCUTANEOUS
  Filled 2017-10-01: qty 0.6

## 2017-10-01 MED ORDER — DIPHENHYDRAMINE HCL 25 MG PO CAPS
ORAL_CAPSULE | ORAL | Status: AC
  Filled 2017-10-01: qty 1

## 2017-10-01 MED ORDER — SODIUM CHLORIDE 0.9 % IV SOLN
75.0000 mg/m2 | Freq: Once | INTRAVENOUS | Status: AC
  Administered 2017-10-01: 150 mg via INTRAVENOUS
  Filled 2017-10-01: qty 15

## 2017-10-01 MED ORDER — DEXAMETHASONE SODIUM PHOSPHATE 10 MG/ML IJ SOLN
INTRAMUSCULAR | Status: AC
  Filled 2017-10-01: qty 1

## 2017-10-01 MED ORDER — SODIUM CHLORIDE 0.9 % IV SOLN
420.0000 mg | Freq: Once | INTRAVENOUS | Status: AC
  Administered 2017-10-01: 420 mg via INTRAVENOUS
  Filled 2017-10-01: qty 14

## 2017-10-01 MED ORDER — ACETAMINOPHEN 325 MG PO TABS
650.0000 mg | ORAL_TABLET | Freq: Once | ORAL | Status: AC
  Administered 2017-10-01: 650 mg via ORAL

## 2017-10-01 MED ORDER — SODIUM CHLORIDE 0.9% FLUSH
10.0000 mL | INTRAVENOUS | Status: DC | PRN
  Administered 2017-10-01: 10 mL
  Filled 2017-10-01: qty 10

## 2017-10-01 MED ORDER — HEPARIN SOD (PORK) LOCK FLUSH 100 UNIT/ML IV SOLN
500.0000 [IU] | Freq: Once | INTRAVENOUS | Status: AC | PRN
  Administered 2017-10-01: 500 [IU]
  Filled 2017-10-01: qty 5

## 2017-10-01 NOTE — Progress Notes (Signed)
1218 - Patient reported "tingling around lips" and a "warmness in chest and going down both legs". No additional symtoms reported or assessed. No tightness in chest or throat, no tingling/numbness in tongue, etc. Herceptin paused and NS wide open. Sandi Mealy, PA notified. VSS. 1225 - Patient reports symptoms resolving. Sandi Mealy to bedside to assess. Patient states that lips are no longer tingling and that the sensation in legs is most likely due to "sitting for so long".  Patient went bathroom and returned reporting "slight" SOB, not uncommon for her throughout the day with activity. 2L O2 provided. Patient administered two puffs of prescribed albuterol inhaler @ 1239. 1240 - Sandi Mealy reports that we are OK to restart Herceptin without any additional intervention.   Wylene Simmer, BSN, RN 10/01/2017 12:43 PM

## 2017-10-01 NOTE — Patient Instructions (Addendum)
[facility] Discharge Instructions for Patients Receiving Chemotherapy  Today you received the following chemotherapy agents: Herceptin, Perjeta, Taxotere, Carboplatin    To help prevent nausea and vomiting after your treatment, we encourage you to take your nausea medication as prescribed.   If you develop nausea and vomiting that is not controlled by your nausea medication, call the clinic.   BELOW ARE SYMPTOMS THAT SHOULD BE REPORTED IMMEDIATELY:  *FEVER GREATER THAN 100.5 F  *CHILLS WITH OR WITHOUT FEVER  NAUSEA AND VOMITING THAT IS NOT CONTROLLED WITH YOUR NAUSEA MEDICATION  *UNUSUAL SHORTNESS OF BREATH  *UNUSUAL BRUISING OR BLEEDING  TENDERNESS IN MOUTH AND THROAT WITH OR WITHOUT PRESENCE OF ULCERS  *URINARY PROBLEMS  *BOWEL PROBLEMS  UNUSUAL RASH Items with * indicate a potential emergency and should be followed up as soon as possible.  Feel free to call the clinic should you have any questions or concerns. The clinic phone number is [phone].  Please show the CHEMO ALERT CARD at check-in to the Emergency Department and triage nurse.  Trastuzumab injection for infusion What is this medicine? TRASTUZUMAB (tras TOO zoo mab) is a monoclonal antibody. It is used to treat breast cancer and stomach cancer. This medicine may be used for other purposes; ask your health care provider or pharmacist if you have questions. COMMON BRAND NAME(S): Herceptin What should I tell my health care provider before I take this medicine? They need to know if you have any of these conditions: -heart disease -heart failure -lung or breathing disease, like asthma -an unusual or allergic reaction to trastuzumab, benzyl alcohol, or other medications, foods, dyes, or preservatives -pregnant or trying to get pregnant -breast-feeding How should I use this medicine? This drug is given as an infusion into a vein. It is administered in a hospital or clinic by a specially  trained health care professional. Talk to your pediatrician regarding the use of this medicine in children. This medicine is not approved for use in children. Overdosage: If you think you have taken too much of this medicine contact a poison control center or emergency room at once. NOTE: This medicine is only for you. Do not share this medicine with others. What if I miss a dose? It is important not to miss a dose. Call your doctor or health care professional if you are unable to keep an appointment. What may interact with this medicine? This medicine may interact with the following medications: -certain types of chemotherapy, such as daunorubicin, doxorubicin, epirubicin, and idarubicin This list may not describe all possible interactions. Give your health care provider a list of all the medicines, herbs, non-prescription drugs, or dietary supplements you use. Also tell them if you smoke, drink alcohol, or use illegal drugs. Some items may interact with your medicine. What should I watch for while using this medicine? Visit your doctor for checks on your progress. Report any side effects. Continue your course of treatment even though you feel ill unless your doctor tells you to stop. Call your doctor or health care professional for advice if you get a fever, chills or sore throat, or other symptoms of a cold or flu. Do not treat yourself. Try to avoid being around people who are sick. You may experience fever, chills and shaking during your first infusion. These effects are usually mild and can be treated with other medicines. Report any side effects during the infusion to your health care professional. Fever and chills usually do not happen with later infusions.   Do not become pregnant while taking this medicine or for 7 months after stopping it. Women should inform their doctor if they wish to become pregnant or think they might be pregnant. Women of child-bearing potential will need to have a  negative pregnancy test before starting this medicine. There is a potential for serious side effects to an unborn child. Talk to your health care professional or pharmacist for more information. Do not breast-feed an infant while taking this medicine or for 7 months after stopping it. Women must use effective birth control with this medicine. What side effects may I notice from receiving this medicine? Side effects that you should report to your doctor or health care professional as soon as possible: -allergic reactions like skin rash, itching or hives, swelling of the face, lips, or tongue -chest pain or palpitations -cough -dizziness -feeling faint or lightheaded, falls -fever -general ill feeling or flu-like symptoms -signs of worsening heart failure like breathing problems; swelling in your legs and feet -unusually weak or tired Side effects that usually do not require medical attention (report to your doctor or health care professional if they continue or are bothersome): -bone pain -changes in taste -diarrhea -joint pain -nausea/vomiting -weight loss This list may not describe all possible side effects. Call your doctor for medical advice about side effects. You may report side effects to FDA at 1-800-FDA-1088. Where should I keep my medicine? This drug is given in a hospital or clinic and will not be stored at home. NOTE: This sheet is a summary. It may not cover all possible information. If you have questions about this medicine, talk to your doctor, pharmacist, or health care provider.  2018 Elsevier/Gold Standard (2016-12-09 14:37:52) Pertuzumab injection What is this medicine? PERTUZUMAB (per TOOZ ue mab) is a monoclonal antibody. It is used to treat breast cancer. This medicine may be used for other purposes; ask your health care provider or pharmacist if you have questions. COMMON BRAND NAME(S): PERJETA What should I tell my health care provider before I take this  medicine? They need to know if you have any of these conditions: -heart disease -heart failure -high blood pressure -history of irregular heart beat -recent or ongoing radiation therapy -an unusual or allergic reaction to pertuzumab, other medicines, foods, dyes, or preservatives -pregnant or trying to get pregnant -breast-feeding How should I use this medicine? This medicine is for infusion into a vein. It is given by a health care professional in a hospital or clinic setting. Talk to your pediatrician regarding the use of this medicine in children. Special care may be needed. Overdosage: If you think you have taken too much of this medicine contact a poison control center or emergency room at once. NOTE: This medicine is only for you. Do not share this medicine with others. What if I miss a dose? It is important not to miss your dose. Call your doctor or health care professional if you are unable to keep an appointment. What may interact with this medicine? Interactions are not expected. Give your health care provider a list of all the medicines, herbs, non-prescription drugs, or dietary supplements you use. Also tell them if you smoke, drink alcohol, or use illegal drugs. Some items may interact with your medicine. This list may not describe all possible interactions. Give your health care provider a list of all the medicines, herbs, non-prescription drugs, or dietary supplements you use. Also tell them if you smoke, drink alcohol, or use illegal drugs. Some items may   interact with your medicine. What should I watch for while using this medicine? Your condition will be monitored carefully while you are receiving this medicine. Report any side effects. Continue your course of treatment even though you feel ill unless your doctor tells you to stop. Do not become pregnant while taking this medicine or for 7 months after stopping it. Women should inform their doctor if they wish to become  pregnant or think they might be pregnant. Women of child-bearing potential will need to have a negative pregnancy test before starting this medicine. There is a potential for serious side effects to an unborn child. Talk to your health care professional or pharmacist for more information. Do not breast-feed an infant while taking this medicine or for 7 months after stopping it. Women must use effective birth control with this medicine. Call your doctor or health care professional for advice if you get a fever, chills or sore throat, or other symptoms of a cold or flu. Do not treat yourself. Try to avoid being around people who are sick. You may experience fever, chills, and headache during the infusion. Report any side effects during the infusion to your health care professional. What side effects may I notice from receiving this medicine? Side effects that you should report to your doctor or health care professional as soon as possible: -breathing problems -chest pain or palpitations -dizziness -feeling faint or lightheaded -fever or chills -skin rash, itching or hives -sore throat -swelling of the face, lips, or tongue -swelling of the legs or ankles -unusually weak or tired Side effects that usually do not require medical attention (report to your doctor or health care professional if they continue or are bothersome): -diarrhea -hair loss -nausea, vomiting -tiredness This list may not describe all possible side effects. Call your doctor for medical advice about side effects. You may report side effects to FDA at 1-800-FDA-1088. Where should I keep my medicine? This drug is given in a hospital or clinic and will not be stored at home. NOTE: This sheet is a summary. It may not cover all possible information. If you have questions about this medicine, talk to your doctor, pharmacist, or health care provider.  2018 Elsevier/Gold Standard (2016-01-17 12:08:50) Docetaxel injection What is  this medicine? DOCETAXEL (doe se TAX el) is a chemotherapy drug. It targets fast dividing cells, like cancer cells, and causes these cells to die. This medicine is used to treat many types of cancers like breast cancer, certain stomach cancers, head and neck cancer, lung cancer, and prostate cancer. This medicine may be used for other purposes; ask your health care provider or pharmacist if you have questions. COMMON BRAND NAME(S): Docefrez, Taxotere What should I tell my health care provider before I take this medicine? They need to know if you have any of these conditions: -infection (especially a virus infection such as chickenpox, cold sores, or herpes) -liver disease -low blood counts, like low white cell, platelet, or red cell counts -an unusual or allergic reaction to docetaxel, polysorbate 80, other chemotherapy agents, other medicines, foods, dyes, or preservatives -pregnant or trying to get pregnant -breast-feeding How should I use this medicine? This drug is given as an infusion into a vein. It is administered in a hospital or clinic by a specially trained health care professional. Talk to your pediatrician regarding the use of this medicine in children. Special care may be needed. Overdosage: If you think you have taken too much of this medicine contact a poison control   center or emergency room at once. NOTE: This medicine is only for you. Do not share this medicine with others. What if I miss a dose? It is important not to miss your dose. Call your doctor or health care professional if you are unable to keep an appointment. What may interact with this medicine? -cyclosporine -erythromycin -ketoconazole -medicines to increase blood counts like filgrastim, pegfilgrastim, sargramostim -vaccines Talk to your doctor or health care professional before taking any of these medicines: -acetaminophen -aspirin -ibuprofen -ketoprofen -naproxen This list may not describe all possible  interactions. Give your health care provider a list of all the medicines, herbs, non-prescription drugs, or dietary supplements you use. Also tell them if you smoke, drink alcohol, or use illegal drugs. Some items may interact with your medicine. What should I watch for while using this medicine? Your condition will be monitored carefully while you are receiving this medicine. You will need important blood work done while you are taking this medicine. This drug may make you feel generally unwell. This is not uncommon, as chemotherapy can affect healthy cells as well as cancer cells. Report any side effects. Continue your course of treatment even though you feel ill unless your doctor tells you to stop. In some cases, you may be given additional medicines to help with side effects. Follow all directions for their use. Call your doctor or health care professional for advice if you get a fever, chills or sore throat, or other symptoms of a cold or flu. Do not treat yourself. This drug decreases your body's ability to fight infections. Try to avoid being around people who are sick. This medicine may increase your risk to bruise or bleed. Call your doctor or health care professional if you notice any unusual bleeding. This medicine may contain alcohol in the product. You may get drowsy or dizzy. Do not drive, use machinery, or do anything that needs mental alertness until you know how this medicine affects you. Do not stand or sit up quickly, especially if you are an older patient. This reduces the risk of dizzy or fainting spells. Avoid alcoholic drinks. Do not become pregnant while taking this medicine. Women should inform their doctor if they wish to become pregnant or think they might be pregnant. There is a potential for serious side effects to an unborn child. Talk to your health care professional or pharmacist for more information. Do not breast-feed an infant while taking this medicine. What side effects  may I notice from receiving this medicine? Side effects that you should report to your doctor or health care professional as soon as possible: -allergic reactions like skin rash, itching or hives, swelling of the face, lips, or tongue -low blood counts - This drug may decrease the number of white blood cells, red blood cells and platelets. You may be at increased risk for infections and bleeding. -signs of infection - fever or chills, cough, sore throat, pain or difficulty passing urine -signs of decreased platelets or bleeding - bruising, pinpoint red spots on the skin, black, tarry stools, nosebleeds -signs of decreased red blood cells - unusually weak or tired, fainting spells, lightheadedness -breathing problems -fast or irregular heartbeat -low blood pressure -mouth sores -nausea and vomiting -pain, swelling, redness or irritation at the injection site -pain, tingling, numbness in the hands or feet -swelling of the ankle, feet, hands -weight gain Side effects that usually do not require medical attention (report to your doctor or health care professional if they continue or are   bothersome): -bone pain -complete hair loss including hair on your head, underarms, pubic hair, eyebrows, and eyelashes -diarrhea -excessive tearing -changes in the color of fingernails -loosening of the fingernails -nausea -muscle pain -red flush to skin -sweating -weak or tired This list may not describe all possible side effects. Call your doctor for medical advice about side effects. You may report side effects to FDA at 1-800-FDA-1088. Where should I keep my medicine? This drug is given in a hospital or clinic and will not be stored at home. NOTE: This sheet is a summary. It may not cover all possible information. If you have questions about this medicine, talk to your doctor, pharmacist, or health care provider.  2018 Elsevier/Gold Standard (2016-01-17 12:32:56) Carboplatin injection What is this  medicine? CARBOPLATIN (KAR boe pla tin) is a chemotherapy drug. It targets fast dividing cells, like cancer cells, and causes these cells to die. This medicine is used to treat ovarian cancer and many other cancers. This medicine may be used for other purposes; ask your health care provider or pharmacist if you have questions. COMMON BRAND NAME(S): Paraplatin What should I tell my health care provider before I take this medicine? They need to know if you have any of these conditions: -blood disorders -hearing problems -kidney disease -recent or ongoing radiation therapy -an unusual or allergic reaction to carboplatin, cisplatin, other chemotherapy, other medicines, foods, dyes, or preservatives -pregnant or trying to get pregnant -breast-feeding How should I use this medicine? This drug is usually given as an infusion into a vein. It is administered in a hospital or clinic by a specially trained health care professional. Talk to your pediatrician regarding the use of this medicine in children. Special care may be needed. Overdosage: If you think you have taken too much of this medicine contact a poison control center or emergency room at once. NOTE: This medicine is only for you. Do not share this medicine with others. What if I miss a dose? It is important not to miss a dose. Call your doctor or health care professional if you are unable to keep an appointment. What may interact with this medicine? -medicines for seizures -medicines to increase blood counts like filgrastim, pegfilgrastim, sargramostim -some antibiotics like amikacin, gentamicin, neomycin, streptomycin, tobramycin -vaccines Talk to your doctor or health care professional before taking any of these medicines: -acetaminophen -aspirin -ibuprofen -ketoprofen -naproxen This list may not describe all possible interactions. Give your health care provider a list of all the medicines, herbs, non-prescription drugs, or dietary  supplements you use. Also tell them if you smoke, drink alcohol, or use illegal drugs. Some items may interact with your medicine. What should I watch for while using this medicine? Your condition will be monitored carefully while you are receiving this medicine. You will need important blood work done while you are taking this medicine. This drug may make you feel generally unwell. This is not uncommon, as chemotherapy can affect healthy cells as well as cancer cells. Report any side effects. Continue your course of treatment even though you feel ill unless your doctor tells you to stop. In some cases, you may be given additional medicines to help with side effects. Follow all directions for their use. Call your doctor or health care professional for advice if you get a fever, chills or sore throat, or other symptoms of a cold or flu. Do not treat yourself. This drug decreases your body's ability to fight infections. Try to avoid being around people who are   sick. This medicine may increase your risk to bruise or bleed. Call your doctor or health care professional if you notice any unusual bleeding. Be careful brushing and flossing your teeth or using a toothpick because you may get an infection or bleed more easily. If you have any dental work done, tell your dentist you are receiving this medicine. Avoid taking products that contain aspirin, acetaminophen, ibuprofen, naproxen, or ketoprofen unless instructed by your doctor. These medicines may hide a fever. Do not become pregnant while taking this medicine. Women should inform their doctor if they wish to become pregnant or think they might be pregnant. There is a potential for serious side effects to an unborn child. Talk to your health care professional or pharmacist for more information. Do not breast-feed an infant while taking this medicine. What side effects may I notice from receiving this medicine? Side effects that you should report to your  doctor or health care professional as soon as possible: -allergic reactions like skin rash, itching or hives, swelling of the face, lips, or tongue -signs of infection - fever or chills, cough, sore throat, pain or difficulty passing urine -signs of decreased platelets or bleeding - bruising, pinpoint red spots on the skin, black, tarry stools, nosebleeds -signs of decreased red blood cells - unusually weak or tired, fainting spells, lightheadedness -breathing problems -changes in hearing -changes in vision -chest pain -high blood pressure -low blood counts - This drug may decrease the number of white blood cells, red blood cells and platelets. You may be at increased risk for infections and bleeding. -nausea and vomiting -pain, swelling, redness or irritation at the injection site -pain, tingling, numbness in the hands or feet -problems with balance, talking, walking -trouble passing urine or change in the amount of urine Side effects that usually do not require medical attention (report to your doctor or health care professional if they continue or are bothersome): -hair loss -loss of appetite -metallic taste in the mouth or changes in taste This list may not describe all possible side effects. Call your doctor for medical advice about side effects. You may report side effects to FDA at 1-800-FDA-1088. Where should I keep my medicine? This drug is given in a hospital or clinic and will not be stored at home. NOTE: This sheet is a summary. It may not cover all possible information. If you have questions about this medicine, talk to your doctor, pharmacist, or health care provider.  2018 Elsevier/Gold Standard (2008-03-21 14:38:05)  

## 2017-10-02 ENCOUNTER — Telehealth: Payer: Self-pay | Admitting: Oncology

## 2017-10-02 ENCOUNTER — Encounter: Payer: 59 | Admitting: Medical

## 2017-10-02 NOTE — Telephone Encounter (Signed)
Scheduled appt per 10/4 sch msg. Spoke with patient regarding her appts.

## 2017-10-02 NOTE — Progress Notes (Signed)
[facility] Progress Note   Tracie [name] [id number] 1951/02/16 66 y.o.  [place] B Ruland is managed by Dr. Gunnar Bulla Magrinat  Actively treated with chemotherapy: yes  Current Therapy: Carboplatin, docetaxel, pertuzumab, and trastuzumab  Last Treated: 10 / 04 / 2018  Assessment: Plan:    Facial tingling   Facial tingling: The patient began having symptoms of perioral tingling, warmth in her chest and in her leg after completing one half of her infusion of trastuzumab. Her trastuzumab was paused, she was continued on IV fluids. She experienced some mild shortness of breath and used her albuterol inhaler. The patient's trastuzumab was restarted after around a 15 minute pause with no additional concerns reported.  Tracie [name] did not require any additional medications in order to complete her treatment.   Please see After Visit Summary for patient specific instructions.  Future Appointments Date Time Provider [place]  10/07/2017 2:00 PM [facility]  10/09/2017 9:00 AM [facility] LAB 6 [facility] None  10/09/2017 9:30 AM Magrinat, [name], MD [facility] None  10/22/2017 8:00 AM [facility] LAB 5 [facility] None  10/22/2017 8:15 AM [facility] PROCEDURE 2 [facility] None  10/22/2017 8:45 AM Causey, [name], NP [facility] None  10/22/2017 10:00 AM [facility] F20 [facility] None    No orders of the defined types were placed in this encounter.      Subjective:   Patient ID:  Tracie [name] is a 66 y.o. (DOB Aug 29, 1951) female.  Chief Complaint: No chief complaint on file.   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 The patient was seen today in the infusion room as she was here to receive her first cycle her first cycle of chemotherapy with Herceptin, pertuzumab, docetaxel, and carboplatin. She is to receive  pegfilgrastim after chemotherapy today. The patient completed around one half of her dose of Herceptin when she began to have tingling of her lips and a sensation of warmth in her chest and down into her legs. She had no additional symptoms. She denied difficulty swallowing, chest and throat tightness. She was having no swallowing in her mouth or tongue. The patient has asthma and uses an inhaler. She used her inhaler and was started on O2 via nasal cannula 2 L/m after getting up to go to the bathroom and reported that she was slightly short of breath. She also reported having a sensation of her legs being asleep but believe that it was related to the way that she was sitting in the bed. All of her symptoms resolved. Her Herceptin was restarted without any additional issues of concern. She was seen several times throughout the rest of her infusions and did well with no other acute issues of concern.  Medications: I have reviewed the patient's current medications.  Allergies:  Allergies  Allergen Reactions  . Fish Allergy Anaphylaxis and Swelling    Swelling of hands and feet.  . Peanut-Containing Drug Products Anaphylaxis    Tree nuts included in allergic reaction.  . Ace Inhibitors Other (See Comments) and Cough    Cold like symptoms     Past Medical History:  Diagnosis Date  . Angina pectoris ([place])   . Asthma   . Cancer ([place])    right breast  . Constipation   . Diverticulitis   . Headache   . HPV in female   . Hyperlipidemia   .  Hypertension   . Hypothyroidism   . Wears dentures   . Wears glasses     Past Surgical History:  Procedure Laterality Date  . CESAREAN SECTION    . COLON SURGERY  2012   sigmoidectomy  . COLOSTOMY    . COLOSTOMY TAKEDOWN  10/06/11  . PORTACATH PLACEMENT N/A 09/25/2017   Procedure: INSERTION PORT-A-CATH WITH ULTRA SOUND ERAS PATHWAY;  Surgeon: [name], MD;  Location: [facility];  Service: General;  Laterality: N/A;  . TONSILLECTOMY    . TUBAL  LIGATION      Family History  Problem Relation Age of Onset  . Cancer Mother        rectal  . Hypertension Brother   . Hypertension Brother   . Asthma Son   . Asthma Brother     Social History   Social History  . Marital status: Divorced    Spouse name: N/A  . Number of children: N/A  . Years of education: N/A   Occupational History  . Not on file.   Social History Main Topics  . Smoking status: Current Every Day Smoker    Packs/day: 0.50    Years: 40.00    Types: Cigarettes  . Smokeless tobacco: Never Used     Comment: Pt already has info  . Alcohol use Yes     Comment: occasional  . Drug use: No  . Sexual activity: Not on file   Other Topics Concern  . Not on file   Social History Narrative  . No narrative on file    Past Medical History, Surgical history, Social history, and Family history were reviewed and updated as appropriate.   Please see review of systems for further details on the patient's review from today.   Review of Systems:  Review of Systems  Constitutional: Negative for chills, diaphoresis and fever.  HENT: Negative for facial swelling, trouble swallowing and voice change.        Perioral tingling  Eyes: Negative for visual disturbance.  Respiratory: Positive for shortness of breath. Negative for cough, choking, chest tightness and wheezing.   Cardiovascular: Negative for chest pain and palpitations.       Sensation of warmth in her chest  Gastrointestinal: Negative for nausea.  Musculoskeletal:       Sensation of warmth in the legs  Neurological: Positive for numbness (Mild numbness in the legs,"like my legs are slightly asleep"). Negative for tremors, speech difficulty and headaches.    Objective:   Physical Exam:  There were no vitals taken for this visit. ECOG: 0  Physical Exam  Constitutional: No distress.  HENT:  Head: Normocephalic and atraumatic.  Mouth/Throat: Oropharynx is clear and moist. No oropharyngeal exudate.    Neck: Normal range of motion. Neck supple.  Cardiovascular: Normal rate, regular rhythm and normal heart sounds.  Exam reveals no gallop and no friction rub.   No murmur heard. Pulmonary/Chest: Effort normal and breath sounds normal. No respiratory distress. She has no wheezes. She has no rales.  Abdominal: Soft. Bowel sounds are normal. She exhibits no distension. There is no tenderness. There is no rebound and no guarding.  Musculoskeletal: She exhibits no edema.  Lymphadenopathy:    She has no cervical adenopathy.  Neurological: She is alert.  Skin: Skin is warm and dry. No rash noted. She is not diaphoretic. No erythema.  Psychiatric: She has a normal mood and affect. Her behavior is normal. Judgment and thought content normal.    Lab Review:  Component Value Date/Time   NA 135 (L) 10/01/2017 0918   K 3.7 10/01/2017 0918   CL 102 09/23/2017 1414   CO2 24 10/01/2017 0918   GLUCOSE 191 (H) 10/01/2017 0918   BUN 15.3 10/01/2017 0918   CREATININE 0.9 10/01/2017 0918   CALCIUM 10.3 10/01/2017 0918   PROT 7.8 10/01/2017 0918   ALBUMIN 3.7 10/01/2017 0918   AST 12 10/01/2017 0918   ALT 19 10/01/2017 0918   ALKPHOS 97 10/01/2017 0918   BILITOT 0.26 10/01/2017 0918   GFRNONAA >60 09/23/2017 1414   GFRAA >60 09/23/2017 1414       Component Value Date/Time   WBC 8.7 10/01/2017 0918   WBC 6.1 09/23/2017 1414   RBC 4.55 10/01/2017 0918   RBC 4.44 09/23/2017 1414   HGB 13.0 10/01/2017 0918   HCT 39.3 10/01/2017 0918   PLT 337 10/01/2017 0918   MCV 86.4 10/01/2017 0918   MCH 28.6 10/01/2017 0918   MCH 28.2 09/23/2017 1414   MCHC 33.1 10/01/2017 0918   MCHC 32.5 09/23/2017 1414   RDW 16.5 (H) 10/01/2017 0918   LYMPHSABS 1.4 10/01/2017 0918   MONOABS 0.5 10/01/2017 0918   EOSABS 0.0 10/01/2017 0918   BASOSABS 0.0 10/01/2017 0918   -------------------------------  Imaging from last 24 hours (if applicable):  Radiology interpretation: Dg Cervical Spine 2-3  Views  Result Date: 09/29/2017 CLINICAL DATA:  66 y/o female with right side breast cancer. Recent whole-body bone scan with increased activity in the mid cervical spine. EXAM: CERVICAL SPINE - 2-3 VIEW COMPARISON:  Whole-body bone scan 09/24/2017. CT chest abdomen and pelvis 09/24/2017. FINDINGS: Normal prevertebral soft tissue contour. Straightening of cervical lordosis. Cervicothoracic junction alignment is within normal limits. Mild dextroconvex cervical spine curvature. Preserved C1-C2 alignment and joint spaces. In general bone mineralization in the cervical spine is within normal limits for age. There is moderate to severe disc space loss and endplate spurring W4-X3 and C5-C6. Partially visible right chest porta cath. Otherwise negative lung apices. Relatively bulky cervical carotid calcified atherosclerosis on the left. IMPRESSION: 1. Advanced cervical disc and endplate degeneration at C4-C5 and C5-C6 felt to explain the recent bone scan activity on the basis of degenerative etiology. 2. Left cervical carotid calcified atherosclerosis. Electronically Signed   By: Genevie Ann M.D.   On: 09/29/2017 16:57   Dg Elbow 2 Views Left  Result Date: 09/29/2017 CLINICAL DATA:  Breast cancer. No injury. Pain involving the olecranon process. EXAM: LEFT ELBOW - 2 VIEW COMPARISON:  None. FINDINGS: The lateral radiograph is minimally degraded due to obliquity. No fracture or elbow joint effusion. Moderate degenerative change involving the humeral-ulnar joint with articular surface irregularity, subchondral sclerosis and osteophytosis. There is a small ossicle adjacent to the coronoid process of the ulna, likely the sequela of remote avulsive injury. Regional soft tissues appear normal.  No radiopaque foreign body. IMPRESSION: 1. No definite acute findings. 2. Moderate degenerative change of the humeral-ulnar joint, potentially the sequela of remote injury. Clinical correlation is advised. Electronically Signed   By: Sandi Mariscal M.D.   On: 09/29/2017 16:57   Ct Chest W Contrast  Result Date: 09/24/2017 CLINICAL DATA:  Staging workup for right breast cancer diagnosed September 2018 in the upper outer quadrant of the right breast with metastatic axillary adenopathy. EXAM: CT CHEST, ABDOMEN, AND PELVIS WITH CONTRAST TECHNIQUE: Multidetector CT imaging of the chest, abdomen and pelvis was performed following the standard protocol during bolus administration of intravenous contrast. CONTRAST:  162mL ISOVUE-300  IOPAMIDOL (ISOVUE-300) INJECTION 61% COMPARISON:  Breast MR from 09/22/2017 FINDINGS: CT CHEST FINDINGS Cardiovascular: Coronary, aortic arch, and branch vessel atherosclerotic vascular disease. Mediastinum/Nodes: Peripheral right breast mass on image 27/2 measuring 2.5 by 1.7 cm. An adjacent axillary lymph node measures 1.2 cm in short axis on image 26/2. There is a subpectoral lymph node on the right measuring 0.9 cm on image 13/2 if, suspicious for possible involvement. No other thoracic adenopathy is identified. Lungs/Pleura: Centrilobular emphysema. Linear subsegmental atelectasis or scarring anteriorly in the left lower lobe and posteriorly in the right lower lobe. Musculoskeletal: 1.8 by 2.2 2.2 cm lytic lesion of the T8 vertebral body anteriorly and slightly eccentric to the left, with questionable cortical breakthrough anteriorly. This is shown on image 115/6. No other discrete lesion in the bony thorax is identified. CT ABDOMEN PELVIS FINDINGS Hepatobiliary: Contracted gallbladder.  Otherwise unremarkable. Pancreas: Unremarkable Spleen: Unremarkable Adrenals/Urinary Tract: Low-density fullness of both adrenal glands. On the right side there is a discrete nodule of the medial limb measuring 2.0 by 1.3 cm with portal venous phase density of 41 Hounsfield units, and delayed phase density of 12 Hounsfield units, yielding a relative washout of 71% compatible with adenoma. The kidneys appear unremarkable as does the urinary  bladder. Stomach/Bowel: Postoperative findings at the rectosigmoid junction. Otherwise unremarkable. Vascular/Lymphatic: Aortoiliac atherosclerotic vascular disease. Small pelvic lymph nodes are not pathologically enlarged by size criteria. Reproductive: Slightly lobular uterine contour suspicious for small fibroids. A 6.0 by 4.5 by 3.9 cm cystic lesion of the left ovary/adnexa extending posteriorly is present. Other: No supplemental non-categorized findings. Musculoskeletal: Lumbar spondylosis and degenerative disc disease with grade 1 degenerative retrolisthesis at L2- 3 and L3-4. A left foraminal disc protrusion at L3-4 is thought to be causing left foraminal stenosis. Endplate sclerosis at the L1- 2 level appears degenerative. IMPRESSION: 1. Right upper lateral breast cancer with a single right axillary and a single right subpectoral lymph node which appear prominent, favoring metastatic adenopathy. 2. 2.2 cm purely lytic lesion of the T8 vertebral body, possible cortical breakthrough anteriorly, highly suspicious for a metastatic lesion, consider thoracic spine MRI for further characterization. 3. A 6.0 cm cystic lesion of the left adnexa/ovary is present. The patient had a large left hydrosalpinx shown on a prior ultrasound of 08/05/2004 and odds are this is the same lesion. However, this is not known to a certainty and accordingly I would recommend pelvic sonography to reassess this region. 4. Other imaging findings of potential clinical significance: Aortic Atherosclerosis (ICD10-I70.0) and Emphysema (ICD10-J43.9). Coronary atherosclerosis. Right adrenal adenoma. Low-density fullness of both adrenal glands. Postoperative findings at the rectosigmoid junction. Uterine fibroids. Lumbar spondylosis and degenerative disc disease thought to be causing left foraminal impingement at L3-4. Electronically Signed   By: Adianna Darwin Clines M.D.   On: 09/24/2017 16:28   Nm Bone Scan Whole Body  Result Date:  09/24/2017 CLINICAL DATA:  RIGHT breast cancer, no bone pain EXAM: NUCLEAR MEDICINE WHOLE BODY BONE SCAN TECHNIQUE: Whole body anterior and posterior images were obtained approximately 3 hours after intravenous injection of radiopharmaceutical. RADIOPHARMACEUTICALS:  19.6 mCi Technetium-33m MDP IV COMPARISON:  None Correlation:  CT chest abdomen pelvis of 09/24/2017 FINDINGS: Uptake at the shoulders, knees, and feet typically degenerative. Prominent uptake at LEFT elbow which could be degenerative or posttraumatic, recommend correlation with history. Uptake within thoracic thoracic spine at approximately T8 or T9 and within lumbar spine at approximately L1-L2. Additional uptake in the cervical spine, nonspecific. Uptake in the mandible bilaterally more intense on  RIGHT, most typically related to dental disease. No additional sites of abnormal osseous tracer accumulation identified. Expected urinary tract and soft tissue distribution of tracer. IMPRESSION: Uptake at the thoracic and lumbar spine and question within the mid cervical spine, cannot exclude osseous metastatic disease. When compared to the prior CT, the area of uptake in the midthoracic spine corresponds with the lytic lesion in T8 vertebral body and is highly suspicious for osseous metastasis. Uptake in the upper lumbar spine at approximately L1-L2 corresponds to degenerative disc disease changes present at L1-L2 on CT. Cervical spine uptake is nonspecific, consider radiographic correlation. Uptake LEFT elbow question arthritic versus posttraumatic, recommend correlation with patient history; if patient has no prior history of trauma or arthritis at the LEFT elbow consider radiographic correlation. Electronically Signed   By: Lavonia Dana M.D.   On: 09/24/2017 16:43   Ct Abdomen Pelvis W Contrast  Result Date: 09/24/2017 CLINICAL DATA:  Staging workup for right breast cancer diagnosed September 2018 in the upper outer quadrant of the right breast with  metastatic axillary adenopathy. EXAM: CT CHEST, ABDOMEN, AND PELVIS WITH CONTRAST TECHNIQUE: Multidetector CT imaging of the chest, abdomen and pelvis was performed following the standard protocol during bolus administration of intravenous contrast. CONTRAST:  179mL ISOVUE-300 IOPAMIDOL (ISOVUE-300) INJECTION 61% COMPARISON:  Breast MR from 09/22/2017 FINDINGS: CT CHEST FINDINGS Cardiovascular: Coronary, aortic arch, and branch vessel atherosclerotic vascular disease. Mediastinum/Nodes: Peripheral right breast mass on image 27/2 measuring 2.5 by 1.7 cm. An adjacent axillary lymph node measures 1.2 cm in short axis on image 26/2. There is a subpectoral lymph node on the right measuring 0.9 cm on image 13/2 if, suspicious for possible involvement. No other thoracic adenopathy is identified. Lungs/Pleura: Centrilobular emphysema. Linear subsegmental atelectasis or scarring anteriorly in the left lower lobe and posteriorly in the right lower lobe. Musculoskeletal: 1.8 by 2.2 2.2 cm lytic lesion of the T8 vertebral body anteriorly and slightly eccentric to the left, with questionable cortical breakthrough anteriorly. This is shown on image 115/6. No other discrete lesion in the bony thorax is identified. CT ABDOMEN PELVIS FINDINGS Hepatobiliary: Contracted gallbladder.  Otherwise unremarkable. Pancreas: Unremarkable Spleen: Unremarkable Adrenals/Urinary Tract: Low-density fullness of both adrenal glands. On the right side there is a discrete nodule of the medial limb measuring 2.0 by 1.3 cm with portal venous phase density of 41 Hounsfield units, and delayed phase density of 12 Hounsfield units, yielding a relative washout of 71% compatible with adenoma. The kidneys appear unremarkable as does the urinary bladder. Stomach/Bowel: Postoperative findings at the rectosigmoid junction. Otherwise unremarkable. Vascular/Lymphatic: Aortoiliac atherosclerotic vascular disease. Small pelvic lymph nodes are not pathologically  enlarged by size criteria. Reproductive: Slightly lobular uterine contour suspicious for small fibroids. A 6.0 by 4.5 by 3.9 cm cystic lesion of the left ovary/adnexa extending posteriorly is present. Other: No supplemental non-categorized findings. Musculoskeletal: Lumbar spondylosis and degenerative disc disease with grade 1 degenerative retrolisthesis at L2- 3 and L3-4. A left foraminal disc protrusion at L3-4 is thought to be causing left foraminal stenosis. Endplate sclerosis at the L1- 2 level appears degenerative. IMPRESSION: 1. Right upper lateral breast cancer with a single right axillary and a single right subpectoral lymph node which appear prominent, favoring metastatic adenopathy. 2. 2.2 cm purely lytic lesion of the T8 vertebral body, possible cortical breakthrough anteriorly, highly suspicious for a metastatic lesion, consider thoracic spine MRI for further characterization. 3. A 6.0 cm cystic lesion of the left adnexa/ovary is present. The patient had a large left  hydrosalpinx shown on a prior ultrasound of 08/05/2004 and odds are this is the same lesion. However, this is not known to a certainty and accordingly I would recommend pelvic sonography to reassess this region. 4. Other imaging findings of potential clinical significance: Aortic Atherosclerosis (ICD10-I70.0) and Emphysema (ICD10-J43.9). Coronary atherosclerosis. Right adrenal adenoma. Low-density fullness of both adrenal glands. Postoperative findings at the rectosigmoid junction. Uterine fibroids. Lumbar spondylosis and degenerative disc disease thought to be causing left foraminal impingement at L3-4. Electronically Signed   By: Treshaun Carrico Clines M.D.   On: 09/24/2017 16:28   Mr Breast Bilateral W Wo Contrast  Result Date: 09/23/2017 CLINICAL DATA:  Biopsy proven invasive ductal carcinoma in the 11 o'clock region of the right breast and metastatic axillary adenopathy. LABS:  None obtained at the time of imaging. EXAM: BILATERAL  BREAST MRI WITH AND WITHOUT CONTRAST TECHNIQUE: Multiplanar, multisequence MR images of both breasts were obtained prior to and following the intravenous administration of 28 ml of MultiHance. THREE-DIMENSIONAL MR IMAGE RENDERING ON INDEPENDENT WORKSTATION: Three-dimensional MR images were rendered by post-processing of the original MR data on an independent workstation. The three-dimensional MR images were interpreted, and findings are reported in the following complete MRI report for this study. Three dimensional images were evaluated at the independent Dyna[employer] workstation COMPARISON:  Previous exam(s). FINDINGS: Breast composition: a. Almost entirely fat. Background parenchymal enhancement: Mild. Right breast: There is a 2.2 x 1.9 x 2.1 cm enhancing spiculated mass in the upper-outer quadrant of the right breast. A signal void artifact is seen at the posterior margin of the mass corresponding with the biopsy clip. Left breast: No mass or abnormal enhancement. Lymph nodes: Several level I abnormal right axillary lymph nodes are present. The largest lymph node measures 1.7 cm. These correspond with the biopsy proven metastatic disease. Ancillary findings:  None. IMPRESSION: 2.2 cm invasive ductal carcinoma in the upper-outer quadrant of the right breast and metastatic axillary adenopathy. No additional lesions are seen in the right breast or the left breast. RECOMMENDATION: Treatment planning of the biopsy proven invasive ductal carcinoma in the right breast with metastatic adenopathy is recommended. BI-RADS CATEGORY  6: Known biopsy-proven malignancy. Electronically Signed   By: Lillia Mountain M.D.   On: 09/23/2017 09:57   Dg Chest Port 1 View  Result Date: 09/25/2017 CLINICAL DATA:  Status post Port-A-Cath placement. EXAM: PORTABLE CHEST 1 VIEW COMPARISON:  Chest CT 09/24/2017 FINDINGS: A right jugular Port-A-Cath has been placed and terminates over the lower SVC. The cardiomediastinal silhouette is within  normal limits. Aortic atherosclerosis is noted. No airspace consolidation, edema, pleural effusion, or pneumothorax is identified. Thoracic spondylosis is noted. IMPRESSION: Port-A-Cath as above. No evidence of pneumothorax or acute airspace disease. Electronically Signed   By: Logan Bores M.D.   On: 09/25/2017 09:33   Dg Fluoro Guide Cv Line-no Report  Result Date: 09/25/2017 Fluoroscopy was utilized by the requesting physician.  No radiographic interpretation.       This was discussed with Dr. Nicholas Lose. He agreed with restarting the patient's trastuzumab.

## 2017-10-04 ENCOUNTER — Other Ambulatory Visit: Payer: Self-pay | Admitting: Oncology

## 2017-10-04 NOTE — Progress Notes (Unsigned)
Bone scan is suggestive of metastatic disease. We will biopsy the lesion and consider starting zolendronate or denosumab.

## 2017-10-05 ENCOUNTER — Telehealth (HOSPITAL_COMMUNITY): Payer: Self-pay | Admitting: Vascular Surgery

## 2017-10-05 NOTE — Telephone Encounter (Signed)
Left pt message to make NEW BRST appt w/ echo in 6 weeks after Thanksgiving per [facility]

## 2017-10-06 NOTE — Anesthesia Postprocedure Evaluation (Signed)
Anesthesia Post Note  Patient: Tracie [name]  Procedure(s) Performed: INSERTION PORT-A-CATH WITH ULTRA SOUND ERAS PATHWAY (N/A Chest)     Patient location during evaluation: PACU Anesthesia Type: General Level of consciousness: sedated and patient cooperative Pain management: pain level controlled Vital Signs Assessment: post-procedure vital signs reviewed and stable Respiratory status: spontaneous breathing Cardiovascular status: stable Anesthetic complications: no    Last Vitals:  Vitals:   09/25/17 0930 09/25/17 0945  BP: 124/74 125/77  Pulse: 73 81  Resp: 14 15  Temp:  (!) 36.2 C  SpO2: 94% 95%    Last Pain:  Vitals:   09/25/17 0945  TempSrc:   PainSc: 0-No pain                 Nolon Nations

## 2017-10-07 ENCOUNTER — Telehealth: Payer: Self-pay

## 2017-10-07 ENCOUNTER — Ambulatory Visit
Admission: RE | Admit: 2017-10-07 | Discharge: 2017-10-07 | Disposition: A | Discharge status: Home / Self Care | Payer: 59 | Source: Ambulatory Visit | Attending: Adult Health | Admitting: Adult Health

## 2017-10-07 DIAGNOSIS — C7951 Secondary malignant neoplasm of bone: Secondary | ICD-10-CM

## 2017-10-07 HISTORY — PX: IR RADIOLOGIST EVAL & MGMT: IMG5224

## 2017-10-07 NOTE — Progress Notes (Signed)
[place]  Telephone:(336) [phone] Fax:(336) [phone]     ID: Tracie [name] DOB: [birth year]  MR#: [id number]  OBS#:[id number]  Patient Care Team: Bartholome Bill, MD as PCP - General (Family Medicine) [name], MD as Consulting Physician (General Surgery) Betsi Crespi, [name], MD as Consulting Physician (Oncology) [name], MD as Attending Physician (Radiation Oncology) OTHER MD:  CHIEF COMPLAINT: Estrogen receptor positive breast cancer  CURRENT TREATMENT: Neoadjuvant chemotherapy/immunotherapy.   HISTORY OF CURRENT ILLNESS: From the original intake note:  Tracie [name] noted a change in her right breast sometime in July 2018. [facility] to report that she had found a "kernel" in her breast. She was advised to see her primary physician which she did. The patient was then set up for bilateral diagnostic mammography with tomography and right breast ultrasonography at [facility] 09/03/2017. The breast density was category I a. In the upper outer quadrant of the right breast there was a 2.1 cm high density mass, which was palpable. Also in the right breast more posteriorly there was a 1.5 cm high density mass which was felt to be a suspicious lymph node. Ultrasound of the right breast confirmed a 2.1 centimeter lobulated solid mass in the right breast upper outer quadrant 15.8 cm from the nipple in the 10:00 radiant. There was a 1.5 cm oval mass in the right axillary tail with a small rounded masses also suspicious for lymph node involvement. A total of 3 suspicious lymph nodes were identified.  On 09/03/2017 the patient underwent biopsy of the breast mass and the suspicious right axillary lymph node. The final pathology ([id number]) found invasive ductal carcinoma, grade 3, in both. Both tumors were estrogen receptor positive at 70-75%, and both were progesterone receptor negative. Both had an elevated proliferation marker at 90%. The mass in the  breast was HER-2 negative, with a signals ratio of 1.25 and the number per cell 1.88. The lymph node mass however was HER-2 positive with a signals ratio of 2.57, and the number per cell 3.60.  The patient's subsequent history is as detailed below.  INTERVAL HISTORY: Tracie [name] returns today for evaluation and treatment of her triple positive breast cancer. Today is day 8 cycle 1 of 6 planned cycles of carboplatin, Taxotere, trastuzumab and Pertuzumab.   Pt had chemotherapy on Thursday, 10/01/2017. She reports that the first few days after she was doing well. On Monday, 10/05/2017, while at work she endorsed sudden fatigue. Pt was sent home from work that day and has not returned since. On Tuesday, 10/06/2017 she experienced nausea, but notes no emesis, only retching. Pt experienced mild constipation the first few days after treatment, and states she had to strain to pass stool. After Monday, she was able to pass two large bowel movements. She never had frank diarrhea although she did have more frequent loose bowel movements than prior. Pt notes that her taste buds have changed some as well. Overall, patient is feeling better today.   REVIEW OF SYSTEMS: Tracie [name] reports episodes of fatigue, mild constipation, nausea and appetite change. She denies unusual headaches, visual changes, vomiting, or dizziness. There has been no unusual cough, phlegm production, or pleurisy. This been no change in bladder habits. She denies unexplained weight loss, bleeding, rash, or fever. A detailed review of systems was otherwise entirely negative.    PAST MEDICAL HISTORY: Past Medical History:  Diagnosis Date  . Angina pectoris ([place])   . Asthma   . Cancer ([place])  right breast  . Constipation   . Diverticulitis   . Headache   . HPV in female   . Hyperlipidemia   . Hypertension   . Hypothyroidism   . Wears dentures   . Wears glasses     PAST SURGICAL HISTORY: Past Surgical History:  Procedure Laterality Date    . CESAREAN SECTION    . COLON SURGERY  2012   sigmoidectomy  . COLOSTOMY    . COLOSTOMY TAKEDOWN  10/06/11  . PORTACATH PLACEMENT N/A 09/25/2017   Procedure: INSERTION PORT-A-CATH WITH ULTRA SOUND ERAS PATHWAY;  Surgeon: [name], MD;  Location: [facility];  Service: General;  Laterality: N/A;  . TONSILLECTOMY    . TUBAL LIGATION      FAMILY HISTORY Family History  Problem Relation Age of Onset  . Cancer Mother        rectal  . Hypertension Brother   . Hypertension Brother   . Asthma Son   . Asthma Brother    The patient has little information regarding her father. Her mother died at age 22 from a strange cancer--the patient does not know what it was, it may well have been cervical cancer from her description. The patient has one brother, no sisters. There is no history of breast or ovarian cancer in the family to the patient's knowledge   GYNECOLOGIC HISTORY:  No LMP recorded. Patient is postmenopausal.  menarche age 68, first live birth age 14 the patient is Tracie [name] P3. She stopped having periods at age 73. She never used oral contraceptives or hormone replacement.   SOCIAL HISTORY:  Works as a Scientist, product/process development. She is divorced. Currently her son Tracie [name] lives with her. He is a Art gallery manager. The 2 other children are Tracie [name], who lives in [place] and works in the theater and media, and Tracie [name] lives in [place], and is vice president of a [employer]. The patient has 5 grandchildren. She is a Psychologist, forensic.     ADVANCED DIRECTIVES: Not in place    HEALTH MAINTENANCE: Social History  Substance Use Topics  . Smoking status: Current Every Day Smoker    Packs/day: 0.50    Years: 40.00    Types: Cigarettes  . Smokeless tobacco: Never Used     Comment: Pt already has info  . Alcohol use Yes     Comment: occasional     Colonoscopy: September 2012   PAP:  Bone density:09/03/2017    Allergies  Allergen Reactions  . Fish Allergy  Anaphylaxis and Swelling    Swelling of hands and feet.  . Peanut-Containing Drug Products Anaphylaxis    Tree nuts included in allergic reaction.  . Ace Inhibitors Other (See Comments) and Cough    Cold like symptoms     Current Outpatient Prescriptions  Medication Sig Dispense Refill  . albuterol (VENTOLIN HFA) 108 (90 BASE) MCG/ACT inhaler Inhale 1-2 puffs into the lungs every 6 (six) hours as needed for wheezing or shortness of breath. 1 Inhaler 6  . Alirocumab (PRALUENT) 75 MG/ML SOPN Inject 75 mg into the muscle every 14 (fourteen) days.     Marland Kitchen amLODipine (NORVASC) 10 MG tablet Take 10 mg by mouth daily.     . cetirizine (ZYRTEC) 10 MG tablet Take 10 mg by mouth as needed for allergies.     Marland Kitchen dexamethasone (DECADRON) 4 MG tablet Take 2 tablets (8 mg total) by mouth 2 (two) times daily. Start the day before Taxotere. Then again  the day after chemo for 3 days. 30 tablet 1  . HYDROcodone-acetaminophen (NORCO) 5-325 MG tablet Take 1-2 tablets by mouth every 6 (six) hours as needed for moderate pain or severe pain. (Patient not taking: Reported on 10/07/2017) 20 tablet 0  . levothyroxine (SYNTHROID, LEVOTHROID) 50 MCG tablet Take 50 mcg by mouth daily.    Marland Kitchen lidocaine-prilocaine (EMLA) cream Apply to affected area once 30 g 3  . loratadine (CLARITIN) 10 MG tablet Take 1 tablet (10 mg total) by mouth daily. 20 tablet 3  . LORazepam (ATIVAN) 0.5 MG tablet Take 1 tablet (0.5 mg total) by mouth at bedtime as needed (Nausea or vomiting). 30 tablet 0  . losartan-hydrochlorothiazide (HYZAAR) 100-12.5 MG tablet Take 1 tablet by mouth daily.   3  . metoprolol succinate (TOPROL-XL) 50 MG 24 hr tablet Take 50 mg by mouth daily.   6  . mometasone-formoterol (DULERA) 100-5 MCG/ACT AERO Inhale 2 puffs into the lungs 2 (two) times daily. 3 Inhaler 0  . nitroGLYCERIN (NITROSTAT) 0.4 MG SL tablet Place 0.4 mg under the tongue every 5 (five) minutes as needed for chest pain.    Marland Kitchen prochlorperazine (COMPAZINE) 10  MG tablet Take 1 tablet (10 mg total) by mouth every 6 (six) hours as needed (Nausea or vomiting). 30 tablet 1   No current facility-administered medications for this visit.     OBJECTIVE: Middle-aged African-American woman Who appears stated age  66:   10/09/17 0902  BP: 119/60  Pulse: 81  Resp: 18  Temp: 98.3 F (36.8 C)  SpO2: 98%     Body mass index is 30.23 kg/m.   Wt Readings from Last 3 Encounters:  10/09/17 176 lb 1.6 oz (79.9 kg)  10/07/17 182 lb (82.6 kg)  09/29/17 182 lb 1.6 oz (82.6 kg)      ECOG FS:1 - Symptomatic but completely ambulatory  Sclerae unicteric, EOMs intact Oropharynx clear and moist No cervical or supraclavicular adenopathy Lungs no rales or rhonchi Heart regular rate and rhythm Abd soft, nontender, positive bowel sounds MSK no focal spinal tenderness, no upper extremity lymphedema Neuro: nonfocal, well oriented, appropriate affect Breasts: Deferred  LAB RESULTS:  CMP     Component Value Date/Time   NA 135 (L) 10/01/2017 0918   K 3.7 10/01/2017 0918   CL 102 09/23/2017 1414   CO2 24 10/01/2017 0918   GLUCOSE 191 (H) 10/01/2017 0918   BUN 15.3 10/01/2017 0918   CREATININE 0.9 10/01/2017 0918   CALCIUM 10.3 10/01/2017 0918   PROT 7.8 10/01/2017 0918   ALBUMIN 3.7 10/01/2017 0918   AST 12 10/01/2017 0918   ALT 19 10/01/2017 0918   ALKPHOS 97 10/01/2017 0918   BILITOT 0.26 10/01/2017 0918   GFRNONAA >60 09/23/2017 1414   GFRAA >60 09/23/2017 1414    No results found for: TOTALPROTELP, ALBUMINELP, A1GS, A2GS, BETS, BETA2SER, GAMS, MSPIKE, SPEI  No results found for: KPAFRELGTCHN, LAMBDASER, KAPLAMBRATIO  Lab Results  Component Value Date   WBC 31.7 (H) 10/09/2017   NEUTROABS 28.5 (H) 10/09/2017   HGB 12.4 10/09/2017   HCT 37.1 10/09/2017   MCV 85.4 10/09/2017   PLT 188 10/09/2017      Chemistry      Component Value Date/Time   NA 135 (L) 10/01/2017 0918   K 3.7 10/01/2017 0918   CL 102 09/23/2017 1414   CO2 24  10/01/2017 0918   BUN 15.3 10/01/2017 0918   CREATININE 0.9 10/01/2017 0918      Component Value  Date/Time   CALCIUM 10.3 10/01/2017 0918   ALKPHOS 97 10/01/2017 0918   AST 12 10/01/2017 0918   ALT 19 10/01/2017 0918   BILITOT 0.26 10/01/2017 0918       No results found for: LABCA2  No components found for: IWOEHO122  No results for input(s): INR in the last 168 hours.  No results found for: LABCA2  No results found for: QMG500  No results found for: BBC488  No results found for: QBV694  No results found for: CA2729  No components found for: HGQUANT  No results found for: CEA1 / No results found for: CEA1   No results found for: AFPTUMOR  No results found for: [place]  No results found for: PSA1  Appointment on 10/09/2017  Component Date Value Ref Range Status  . WBC 10/09/2017 31.7* 3.9 - 10.3 10e3/uL Final  . NEUT# 10/09/2017 28.5* 1.5 - 6.5 10e3/uL Final  . HGB 10/09/2017 12.4  11.6 - 15.9 g/dL Final  . HCT 10/09/2017 37.1  34.8 - 46.6 % Final  . Platelets 10/09/2017 188  145 - 400 10e3/uL Final  . MCV 10/09/2017 85.4  79.5 - 101.0 fL Final  . MCH 10/09/2017 28.5  25.1 - 34.0 pg Final  . MCHC 10/09/2017 33.3  31.5 - 36.0 g/dL Final  . RBC 10/09/2017 4.34  3.70 - 5.45 10e6/uL Final  . RDW 10/09/2017 16.5* 11.2 - 14.5 % Final  . lymph# 10/09/2017 2.9  0.9 - 3.3 10e3/uL Final  . MONO# 10/09/2017 0.1  0.1 - 0.9 10e3/uL Final  . Eosinophils Absolute 10/09/2017 0.0  0.0 - 0.5 10e3/uL Final  . Basophils Absolute 10/09/2017 0.1  0.0 - 0.1 10e3/uL Final  . NEUT% 10/09/2017 90.0* 38.4 - 76.8 % Final  . LYMPH% 10/09/2017 9.3* 14.0 - 49.7 % Final  . MONO% 10/09/2017 0.3  0.0 - 14.0 % Final  . EOS% 10/09/2017 0.1  0.0 - 7.0 % Final  . BASO% 10/09/2017 0.3  0.0 - 2.0 % Final    (this displays the last labs from the last 3 days)  No results found for: TOTALPROTELP, ALBUMINELP, A1GS, A2GS, BETS, BETA2SER, GAMS, MSPIKE, SPEI (this displays SPEP labs)  No  results found for: KPAFRELGTCHN, LAMBDASER, KAPLAMBRATIO (kappa/lambda light chains)  No results found for: HGBA, HGBA2QUANT, HGBFQUANT, HGBSQUAN (Hemoglobinopathy evaluation)   No results found for: LDH  No results found for: IRON, TIBC, IRONPCTSAT (Iron and TIBC)  No results found for: FERRITIN  Urinalysis    Component Value Date/Time   LABSPEC 1.020 02/10/2011 2058   PHURINE 6.0 02/10/2011 2058   HGBUR LARGE (A) 02/10/2011 2058   BILIRUBINUR MODERATE (A) 02/10/2011 2058   KETONESUR 15 (A) 02/10/2011 2058   PROTEINUR >=300 (A) 02/10/2011 2058   UROBILINOGEN >=8.0 02/10/2011 2058   NITRITE NEGATIVE 02/10/2011 2058   LEUKOCYTESUR  02/10/2011 2058    NEGATIVE Biochemical Testing Only. Please order routine urinalysis from main lab if confirmatory testing is needed.     STUDIES: Reviewed zoster vaccine documentation with patient  ELIGIBLE FOR AVAILABLE RESEARCH PROTOCOL: no  ASSESSMENT: 66 y.o. [place] woman status post right breast upper outer quadrant and right axillary lymph node biopsy 09/03/2017, both positive for invasive ductal carcinoma, grade 3, estrogen receptor positive, progesterone receptor negative, with an MIB-1 of 90%, the lymph node being HER-2 positive, the breast mass HER-2 negative  (1) neoadjuvant chemotherapy with carboplatin, docetaxel, trastuzumab, and pertuzumab started 10/01/2017  (2) trastuzumab to continue to complete 12 months  (3) definitive surgery to  follow chemotherapy  (4) adjuvant radiation to follow  (5) anti-estrogens to start at the completion of local treatment  PLAN: Tracie [name] did remarkably well with her first cycle of chemotherapy and in particular her diarrhea was moderate. I am adding Lomotil to her treatments and hopefully she will be able to continue with the Pertuzumab without further changes.  I am adding ondansetron which she may take beginning on day 4. This will make her a little bit less somnolent than Compazine and  also will add to the diarrhea control since ondansetron of course can cause constipation. She understands her fatigue will improve over the next several days. Her taste alteration however will persist and will not clear until some weeks after she completes all chemotherapy.  She wanted to know what would happen if she stopped the chemotherapy and simply had surgery and radiation. We went over the fact that if she does not have any systemic treatment she is at risk of the cancer which may already be in her bones lungs and liver growing and taking her life.  I have set her up for her remaining treatments and she will have visits with each treatment. The question is whether she will need day 8 visits as well and that can be decided when she returns for cycle 2  She knows to call for any other problems that may develop before the next visit.   Argie Applegate, [name], MD  10/09/17 9:21 AM Medical Oncology and Hematology [facility] [address] [place], [place] [address] Tel. [phone]    Fax. [phone]  This document serves as a record of services personally performed by Chauncey Cruel, MD. It was created on her behalf by Margit Banda, a trained medical scribe. The creation of this record is based on the scribe's personal observations and the provider's statements to them. This document has been checked and approved by the attending provider.

## 2017-10-07 NOTE — Consult Note (Addendum)
Chief Complaint: Patient was seen in consultation today for  Chief Complaint  Patient presents with  . Advice Only    Osteocool & Bx of T8   at the request of [place]  Referring Physician(s): Causey,Lindsey Cornetto  History of Present Illness: Tracie [name] is a 66 y.o. female with a recent diagnosis of locally advanced and stage IV breast cancer of the upper outer quadrant. Pathologic diagnosis invasive ductal carcinoma, receptor positive, HER-2 negative, Ki-67 90%, grade 3. She had positive axillary lymph nodes and a 2.1 cm primary mass.  Staging imaging demonstrates a lytic lesion in the T8 vertebral body.  She is currently undergoing neoadjuvant chemotherapy/immunotherapy with carboplatin, docetaxel, trastuzumab and pertuzamab which was initiated on 10/01/2017.  She is referred at the kind request of Wilber Bihari to discuss if she has a candidate for combined radio frequency ablation and kyphoplasty of her T8 metastatic lesion.  While she denies back pain, she has had progressive right scapular pain over the past several months.   Past Medical History:  Diagnosis Date  . Angina pectoris ([place])   . Asthma   . Cancer ([place])    right breast  . Constipation   . Diverticulitis   . Headache   . HPV in female   . Hyperlipidemia   . Hypertension   . Hypothyroidism   . Wears dentures   . Wears glasses     Past Surgical History:  Procedure Laterality Date  . CESAREAN SECTION    . COLON SURGERY  2012   sigmoidectomy  . COLOSTOMY    . COLOSTOMY TAKEDOWN  10/06/11  . PORTACATH PLACEMENT N/A 09/25/2017   Procedure: INSERTION PORT-A-CATH WITH ULTRA SOUND ERAS PATHWAY;  Surgeon: [name], MD;  Location: [facility];  Service: General;  Laterality: N/A;  . TONSILLECTOMY    . TUBAL LIGATION      Allergies: Fish allergy; Peanut-containing drug products; and Ace inhibitors  Medications: Prior to Admission medications   Medication Sig Start Date End Date  Taking? Authorizing Provider  albuterol (VENTOLIN HFA) 108 (90 BASE) MCG/ACT inhaler Inhale 1-2 puffs into the lungs every 6 (six) hours as needed for wheezing or shortness of breath. 10/10/14  Yes Tanda Rockers, MD  Alirocumab (PRALUENT) 75 MG/ML SOPN Inject 75 mg into the muscle every 14 (fourteen) days.  08/10/17  Yes [provider]  amLODipine (NORVASC) 10 MG tablet Take 10 mg by mouth daily.  09/01/17  Yes [provider]  cetirizine (ZYRTEC) 10 MG tablet Take 10 mg by mouth as needed for allergies.  08/17/17  Yes [provider]  dexamethasone (DECADRON) 4 MG tablet Take 2 tablets (8 mg total) by mouth 2 (two) times daily. Start the day before Taxotere. Then again the day after chemo for 3 days. 09/09/17  Yes Magrinat, [name], MD  levothyroxine (SYNTHROID, LEVOTHROID) 50 MCG tablet Take 50 mcg by mouth daily. 05/27/16  Yes [provider]  lidocaine-prilocaine (EMLA) cream Apply to affected area once 09/09/17  Yes Magrinat, [name], MD  loratadine (CLARITIN) 10 MG tablet Take 1 tablet (10 mg total) by mouth daily. 10/01/17  Yes Magrinat, [name], MD  LORazepam (ATIVAN) 0.5 MG tablet Take 1 tablet (0.5 mg total) by mouth at bedtime as needed (Nausea or vomiting). 09/09/17  Yes Magrinat, [name], MD  losartan-hydrochlorothiazide (HYZAAR) 100-12.5 MG tablet Take 1 tablet by mouth daily.  08/17/17  Yes [provider]  metoprolol succinate (TOPROL-XL) 50 MG 24 hr  tablet Take 50 mg by mouth daily.  07/02/17  Yes [provider]  mometasone-formoterol (DULERA) 100-5 MCG/ACT AERO Inhale 2 puffs into the lungs 2 (two) times daily. 01/04/16  Yes Tanda Rockers, MD  nitroGLYCERIN (NITROSTAT) 0.4 MG SL tablet Place 0.4 mg under the tongue every 5 (five) minutes as needed for chest pain.   Yes [provider]  prochlorperazine (COMPAZINE) 10 MG tablet Take 1 tablet (10 mg total) by mouth every 6 (six) hours as needed (Nausea or vomiting). 09/09/17  Yes  Magrinat, [name], MD  HYDROcodone-acetaminophen (NORCO) 5-325 MG tablet Take 1-2 tablets by mouth every 6 (six) hours as needed for moderate pain or severe pain. Patient not taking: Reported on 10/07/2017 09/25/17   [name], MD     Family History  Problem Relation Age of Onset  . Cancer Mother        rectal  . Hypertension Brother   . Hypertension Brother   . Asthma Son   . Asthma Brother     Social History   Social History  . Marital status: Divorced    Spouse name: N/A  . Number of children: N/A  . Years of education: N/A   Social History Main Topics  . Smoking status: Current Every Day Smoker    Packs/day: 0.50    Years: 40.00    Types: Cigarettes  . Smokeless tobacco: Never Used     Comment: Pt already has info  . Alcohol use Yes     Comment: occasional  . Drug use: No  . Sexual activity: Not on file   Other Topics Concern  . Not on file   Social History Narrative  . No narrative on file    ECOG Status: 1 - Symptomatic but completely ambulatory  Review of Systems: A 12 point ROS discussed and pertinent positives are indicated in the HPI above.  All other systems are negative.  Review of Systems  Vital Signs: BP 110/62   Pulse 99   Temp 98.1 F (36.7 C) (Oral)   Resp 16   Ht 5' 4"  (1.626 m)   Wt 182 lb (82.6 kg)   SpO2 97%   BMI 31.24 kg/m   Physical Exam  Constitutional: She is oriented to person, place, and time. She appears well-developed and well-nourished. No distress.  HENT:  Head: Normocephalic and atraumatic.  Eyes: No scleral icterus.  Cardiovascular: Normal rate and regular rhythm.   Pulmonary/Chest: Effort normal.  Abdominal: Soft. There is no tenderness.  Musculoskeletal: She exhibits no tenderness.  Neurological: She is alert and oriented to person, place, and time.  Skin: Skin is warm and dry.  Psychiatric: She has a normal mood and affect. Her behavior is normal.  Nursing note and vitals reviewed.    Imaging: Dg  Cervical Spine 2-3 Views  Result Date: 09/29/2017 CLINICAL DATA:  66 y/o female with right side breast cancer. Recent whole-body bone scan with increased activity in the mid cervical spine. EXAM: CERVICAL SPINE - 2-3 VIEW COMPARISON:  Whole-body bone scan 09/24/2017. CT chest abdomen and pelvis 09/24/2017. FINDINGS: Normal prevertebral soft tissue contour. Straightening of cervical lordosis. Cervicothoracic junction alignment is within normal limits. Mild dextroconvex cervical spine curvature. Preserved C1-C2 alignment and joint spaces. In general bone mineralization in the cervical spine is within normal limits for age. There is moderate to severe disc space loss and endplate spurring E7-O3 and C5-C6. Partially visible right chest porta cath. Otherwise negative lung apices. Relatively bulky cervical carotid calcified  atherosclerosis on the left. IMPRESSION: 1. Advanced cervical disc and endplate degeneration at C4-C5 and C5-C6 felt to explain the recent bone scan activity on the basis of degenerative etiology. 2. Left cervical carotid calcified atherosclerosis. Electronically Signed   By: Genevie Ann M.D.   On: 09/29/2017 16:57   Dg Elbow 2 Views Left  Result Date: 09/29/2017 CLINICAL DATA:  Breast cancer. No injury. Pain involving the olecranon process. EXAM: LEFT ELBOW - 2 VIEW COMPARISON:  None. FINDINGS: The lateral radiograph is minimally degraded due to obliquity. No fracture or elbow joint effusion. Moderate degenerative change involving the humeral-ulnar joint with articular surface irregularity, subchondral sclerosis and osteophytosis. There is a small ossicle adjacent to the coronoid process of the ulna, likely the sequela of remote avulsive injury. Regional soft tissues appear normal.  No radiopaque foreign body. IMPRESSION: 1. No definite acute findings. 2. Moderate degenerative change of the humeral-ulnar joint, potentially the sequela of remote injury. Clinical correlation is advised. Electronically  Signed   By: Sandi Mariscal M.D.   On: 09/29/2017 16:57   Ct Chest W Contrast  Result Date: 09/24/2017 CLINICAL DATA:  Staging workup for right breast cancer diagnosed September 2018 in the upper outer quadrant of the right breast with metastatic axillary adenopathy. EXAM: CT CHEST, ABDOMEN, AND PELVIS WITH CONTRAST TECHNIQUE: Multidetector CT imaging of the chest, abdomen and pelvis was performed following the standard protocol during bolus administration of intravenous contrast. CONTRAST:  182m ISOVUE-300 IOPAMIDOL (ISOVUE-300) INJECTION 61% COMPARISON:  Breast MR from 09/22/2017 FINDINGS: CT CHEST FINDINGS Cardiovascular: Coronary, aortic arch, and branch vessel atherosclerotic vascular disease. Mediastinum/Nodes: Peripheral right breast mass on image 27/2 measuring 2.5 by 1.7 cm. An adjacent axillary lymph node measures 1.2 cm in short axis on image 26/2. There is a subpectoral lymph node on the right measuring 0.9 cm on image 13/2 if, suspicious for possible involvement. No other thoracic adenopathy is identified. Lungs/Pleura: Centrilobular emphysema. Linear subsegmental atelectasis or scarring anteriorly in the left lower lobe and posteriorly in the right lower lobe. Musculoskeletal: 1.8 by 2.2 2.2 cm lytic lesion of the T8 vertebral body anteriorly and slightly eccentric to the left, with questionable cortical breakthrough anteriorly. This is shown on image 115/6. No other discrete lesion in the bony thorax is identified. CT ABDOMEN PELVIS FINDINGS Hepatobiliary: Contracted gallbladder.  Otherwise unremarkable. Pancreas: Unremarkable Spleen: Unremarkable Adrenals/Urinary Tract: Low-density fullness of both adrenal glands. On the right side there is a discrete nodule of the medial limb measuring 2.0 by 1.3 cm with portal venous phase density of 41 Hounsfield units, and delayed phase density of 12 Hounsfield units, yielding a relative washout of 71% compatible with adenoma. The kidneys appear unremarkable as  does the urinary bladder. Stomach/Bowel: Postoperative findings at the rectosigmoid junction. Otherwise unremarkable. Vascular/Lymphatic: Aortoiliac atherosclerotic vascular disease. Small pelvic lymph nodes are not pathologically enlarged by size criteria. Reproductive: Slightly lobular uterine contour suspicious for small fibroids. A 6.0 by 4.5 by 3.9 cm cystic lesion of the left ovary/adnexa extending posteriorly is present. Other: No supplemental non-categorized findings. Musculoskeletal: Lumbar spondylosis and degenerative disc disease with grade 1 degenerative retrolisthesis at L2- 3 and L3-4. A left foraminal disc protrusion at L3-4 is thought to be causing left foraminal stenosis. Endplate sclerosis at the L1- 2 level appears degenerative. IMPRESSION: 1. Right upper lateral breast cancer with a single right axillary and a single right subpectoral lymph node which appear prominent, favoring metastatic adenopathy. 2. 2.2 cm purely lytic lesion of the T8 vertebral body, possible  cortical breakthrough anteriorly, highly suspicious for a metastatic lesion, consider thoracic spine MRI for further characterization. 3. A 6.0 cm cystic lesion of the left adnexa/ovary is present. The patient had a large left hydrosalpinx shown on a prior ultrasound of 08/05/2004 and odds are this is the same lesion. However, this is not known to a certainty and accordingly I would recommend pelvic sonography to reassess this region. 4. Other imaging findings of potential clinical significance: Aortic Atherosclerosis (ICD10-I70.0) and Emphysema (ICD10-J43.9). Coronary atherosclerosis. Right adrenal adenoma. Low-density fullness of both adrenal glands. Postoperative findings at the rectosigmoid junction. Uterine fibroids. Lumbar spondylosis and degenerative disc disease thought to be causing left foraminal impingement at L3-4. Electronically Signed   By: Van Clines M.D.   On: 09/24/2017 16:28   Nm Bone Scan Whole  Body  Result Date: 09/24/2017 CLINICAL DATA:  RIGHT breast cancer, no bone pain EXAM: NUCLEAR MEDICINE WHOLE BODY BONE SCAN TECHNIQUE: Whole body anterior and posterior images were obtained approximately 3 hours after intravenous injection of radiopharmaceutical. RADIOPHARMACEUTICALS:  19.6 mCi Technetium-34mMDP IV COMPARISON:  None Correlation:  CT chest abdomen pelvis of 09/24/2017 FINDINGS: Uptake at the shoulders, knees, and feet typically degenerative. Prominent uptake at LEFT elbow which could be degenerative or posttraumatic, recommend correlation with history. Uptake within thoracic thoracic spine at approximately T8 or T9 and within lumbar spine at approximately L1-L2. Additional uptake in the cervical spine, nonspecific. Uptake in the mandible bilaterally more intense on RIGHT, most typically related to dental disease. No additional sites of abnormal osseous tracer accumulation identified. Expected urinary tract and soft tissue distribution of tracer. IMPRESSION: Uptake at the thoracic and lumbar spine and question within the mid cervical spine, cannot exclude osseous metastatic disease. When compared to the prior CT, the area of uptake in the midthoracic spine corresponds with the lytic lesion in T8 vertebral body and is highly suspicious for osseous metastasis. Uptake in the upper lumbar spine at approximately L1-L2 corresponds to degenerative disc disease changes present at L1-L2 on CT. Cervical spine uptake is nonspecific, consider radiographic correlation. Uptake LEFT elbow question arthritic versus posttraumatic, recommend correlation with patient history; if patient has no prior history of trauma or arthritis at the LEFT elbow consider radiographic correlation. Electronically Signed   By: MLavonia DanaM.D.   On: 09/24/2017 16:43   Ct Abdomen Pelvis W Contrast  Result Date: 09/24/2017 CLINICAL DATA:  Staging workup for right breast cancer diagnosed September 2018 in the upper outer quadrant of  the right breast with metastatic axillary adenopathy. EXAM: CT CHEST, ABDOMEN, AND PELVIS WITH CONTRAST TECHNIQUE: Multidetector CT imaging of the chest, abdomen and pelvis was performed following the standard protocol during bolus administration of intravenous contrast. CONTRAST:  1066mISOVUE-300 IOPAMIDOL (ISOVUE-300) INJECTION 61% COMPARISON:  Breast MR from 09/22/2017 FINDINGS: CT CHEST FINDINGS Cardiovascular: Coronary, aortic arch, and branch vessel atherosclerotic vascular disease. Mediastinum/Nodes: Peripheral right breast mass on image 27/2 measuring 2.5 by 1.7 cm. An adjacent axillary lymph node measures 1.2 cm in short axis on image 26/2. There is a subpectoral lymph node on the right measuring 0.9 cm on image 13/2 if, suspicious for possible involvement. No other thoracic adenopathy is identified. Lungs/Pleura: Centrilobular emphysema. Linear subsegmental atelectasis or scarring anteriorly in the left lower lobe and posteriorly in the right lower lobe. Musculoskeletal: 1.8 by 2.2 2.2 cm lytic lesion of the T8 vertebral body anteriorly and slightly eccentric to the left, with questionable cortical breakthrough anteriorly. This is shown on image 115/6. No other discrete  lesion in the bony thorax is identified. CT ABDOMEN PELVIS FINDINGS Hepatobiliary: Contracted gallbladder.  Otherwise unremarkable. Pancreas: Unremarkable Spleen: Unremarkable Adrenals/Urinary Tract: Low-density fullness of both adrenal glands. On the right side there is a discrete nodule of the medial limb measuring 2.0 by 1.3 cm with portal venous phase density of 41 Hounsfield units, and delayed phase density of 12 Hounsfield units, yielding a relative washout of 71% compatible with adenoma. The kidneys appear unremarkable as does the urinary bladder. Stomach/Bowel: Postoperative findings at the rectosigmoid junction. Otherwise unremarkable. Vascular/Lymphatic: Aortoiliac atherosclerotic vascular disease. Small pelvic lymph nodes are  not pathologically enlarged by size criteria. Reproductive: Slightly lobular uterine contour suspicious for small fibroids. A 6.0 by 4.5 by 3.9 cm cystic lesion of the left ovary/adnexa extending posteriorly is present. Other: No supplemental non-categorized findings. Musculoskeletal: Lumbar spondylosis and degenerative disc disease with grade 1 degenerative retrolisthesis at L2- 3 and L3-4. A left foraminal disc protrusion at L3-4 is thought to be causing left foraminal stenosis. Endplate sclerosis at the L1- 2 level appears degenerative. IMPRESSION: 1. Right upper lateral breast cancer with a single right axillary and a single right subpectoral lymph node which appear prominent, favoring metastatic adenopathy. 2. 2.2 cm purely lytic lesion of the T8 vertebral body, possible cortical breakthrough anteriorly, highly suspicious for a metastatic lesion, consider thoracic spine MRI for further characterization. 3. A 6.0 cm cystic lesion of the left adnexa/ovary is present. The patient had a large left hydrosalpinx shown on a prior ultrasound of 08/05/2004 and odds are this is the same lesion. However, this is not known to a certainty and accordingly I would recommend pelvic sonography to reassess this region. 4. Other imaging findings of potential clinical significance: Aortic Atherosclerosis (ICD10-I70.0) and Emphysema (ICD10-J43.9). Coronary atherosclerosis. Right adrenal adenoma. Low-density fullness of both adrenal glands. Postoperative findings at the rectosigmoid junction. Uterine fibroids. Lumbar spondylosis and degenerative disc disease thought to be causing left foraminal impingement at L3-4. Electronically Signed   By: Van Clines M.D.   On: 09/24/2017 16:28   Mr Breast Bilateral W Wo Contrast  Result Date: 09/23/2017 CLINICAL DATA:  Biopsy proven invasive ductal carcinoma in the 11 o'clock region of the right breast and metastatic axillary adenopathy. LABS:  None obtained at the time of imaging.  EXAM: BILATERAL BREAST MRI WITH AND WITHOUT CONTRAST TECHNIQUE: Multiplanar, multisequence MR images of both breasts were obtained prior to and following the intravenous administration of 28 ml of MultiHance. THREE-DIMENSIONAL MR IMAGE RENDERING ON INDEPENDENT WORKSTATION: Three-dimensional MR images were rendered by post-processing of the original MR data on an independent workstation. The three-dimensional MR images were interpreted, and findings are reported in the following complete MRI report for this study. Three dimensional images were evaluated at the independent DynaCad workstation COMPARISON:  Previous exam(s). FINDINGS: Breast composition: a. Almost entirely fat. Background parenchymal enhancement: Mild. Right breast: There is a 2.2 x 1.9 x 2.1 cm enhancing spiculated mass in the upper-outer quadrant of the right breast. A signal void artifact is seen at the posterior margin of the mass corresponding with the biopsy clip. Left breast: No mass or abnormal enhancement. Lymph nodes: Several level I abnormal right axillary lymph nodes are present. The largest lymph node measures 1.7 cm. These correspond with the biopsy proven metastatic disease. Ancillary findings:  None. IMPRESSION: 2.2 cm invasive ductal carcinoma in the upper-outer quadrant of the right breast and metastatic axillary adenopathy. No additional lesions are seen in the right breast or the left breast. RECOMMENDATION: Treatment planning  of the biopsy proven invasive ductal carcinoma in the right breast with metastatic adenopathy is recommended. BI-RADS CATEGORY  6: Known biopsy-proven malignancy. Electronically Signed   By: Lillia Mountain M.D.   On: 09/23/2017 09:57   Dg Chest Port 1 View  Result Date: 09/25/2017 CLINICAL DATA:  Status post Port-A-Cath placement. EXAM: PORTABLE CHEST 1 VIEW COMPARISON:  Chest CT 09/24/2017 FINDINGS: A right jugular Port-A-Cath has been placed and terminates over the lower SVC. The cardiomediastinal silhouette  is within normal limits. Aortic atherosclerosis is noted. No airspace consolidation, edema, pleural effusion, or pneumothorax is identified. Thoracic spondylosis is noted. IMPRESSION: Port-A-Cath as above. No evidence of pneumothorax or acute airspace disease. Electronically Signed   By: Logan Bores M.D.   On: 09/25/2017 09:33   Dg Fluoro Guide Cv Line-no Report  Result Date: 09/25/2017 Fluoroscopy was utilized by the requesting physician.  No radiographic interpretation.    Labs:  CBC:  Recent Labs  09/09/17 0830 09/23/17 1414 10/01/17 0918  WBC 5.9 6.1 8.7  HGB 12.9 12.5 13.0  HCT 38.6 38.5 39.3  PLT 338 335 337    COAGS: No results for input(s): INR, APTT in the last 8760 hours.  BMP:  Recent Labs  09/09/17 0830 09/23/17 1414 10/01/17 0918  NA 138 137 135*  K 3.8 3.2* 3.7  CL  --  102  --   CO2 26 26 24   GLUCOSE 106 98 191*  BUN 13.4 9 15.3  CALCIUM 9.9 9.5 10.3  CREATININE 0.9 0.84 0.9  GFRNONAA  --  >60  --   GFRAA  --  >60  --     LIVER FUNCTION TESTS:  Recent Labs  09/09/17 0830 10/01/17 0918  BILITOT <0.22 0.26  AST 15 12  ALT 15 19  ALKPHOS 124 97  PROT 7.4 7.8  ALBUMIN 3.6 3.7    TUMOR MARKERS: No results for input(s): AFPTM, CEA, CA199, CHROMGRNA in the last 8760 hours.  Assessment and Plan:  Extremely pleasant 67 year old female with a new diagnosis of stage IV breast cancer including putative metastatic disease to the T8 vertebral body.  Biopsy is warranted to confirm tissue diagnosis as this will alter her treatment plan.   Furthermore, the lesion appears to extend through the vertebral body cortex and she does have some right scapular pain which is likely referred from T8.  She is a good candidate for transpedicular biopsy and OsteoCool (RFA + KP) of the T8 lesion which could all be performed in the same session.   1.) Schedule for T8 OsteoCool with biopsy at [facility] ASAP.    Thank you for this interesting consult.  I greatly enjoyed  meeting Rodolfo B Gosney and look forward to participating in their care.  A copy of this report was sent to the requesting provider on this date.  Electronically Signed: Jacqulynn Cadet 10/07/2017, 2:55 PM   I spent a total of  40 Minutes  in face to face in clinical consultation, greater than 50% of which was counseling/coordinating care for breast cancer metastatic to bone

## 2017-10-07 NOTE — Telephone Encounter (Signed)
Nutrition  Received voice mail as patient with questions regarding nutrition.    Called patient back this am.  Patient reports took first chemotherapy without nausea and had regular bowel movement.  Does report sore mouth/scratchy throat and wanting to know what foods to eat to help with this.  Reports appetite has been good following chemotherapy.    Intervention: Answered questions that were diet related.  Discussed foods to consider with sore mouth/scratchy throat.  Encouraged patient to contact medical oncology team with symptoms to evaluate mouth and throat. Patient verbalized understanding.  Tracie [name] B. Zenia Resides, [place], [place] Registered Dietitian [phone] (pager)

## 2017-10-08 ENCOUNTER — Other Ambulatory Visit: Payer: Self-pay

## 2017-10-08 DIAGNOSIS — C50411 Malignant neoplasm of upper-outer quadrant of right female breast: Secondary | ICD-10-CM

## 2017-10-08 DIAGNOSIS — Z170 Estrogen receptor positive status [ER+]: Principal | ICD-10-CM

## 2017-10-09 ENCOUNTER — Other Ambulatory Visit (HOSPITAL_BASED_OUTPATIENT_CLINIC_OR_DEPARTMENT_OTHER): Payer: 59

## 2017-10-09 ENCOUNTER — Ambulatory Visit (HOSPITAL_BASED_OUTPATIENT_CLINIC_OR_DEPARTMENT_OTHER): Payer: 59 | Admitting: Oncology

## 2017-10-09 ENCOUNTER — Telehealth: Payer: Self-pay | Admitting: Oncology

## 2017-10-09 VITALS — BP 119/60 | HR 81 | Temp 98.3°F | Resp 18 | Ht 64.0 in | Wt 176.1 lb

## 2017-10-09 DIAGNOSIS — R438 Other disturbances of smell and taste: Secondary | ICD-10-CM | POA: Diagnosis not present

## 2017-10-09 DIAGNOSIS — R530 Neoplastic (malignant) related fatigue: Secondary | ICD-10-CM

## 2017-10-09 DIAGNOSIS — C50411 Malignant neoplasm of upper-outer quadrant of right female breast: Secondary | ICD-10-CM | POA: Diagnosis not present

## 2017-10-09 DIAGNOSIS — C773 Secondary and unspecified malignant neoplasm of axilla and upper limb lymph nodes: Secondary | ICD-10-CM

## 2017-10-09 DIAGNOSIS — Z170 Estrogen receptor positive status [ER+]: Secondary | ICD-10-CM

## 2017-10-09 LAB — COMPREHENSIVE METABOLIC PANEL
ALBUMIN: 3.3 g/dL — AB (ref 3.5–5.0)
ALK PHOS: 118 U/L (ref 40–150)
ALT: 38 U/L (ref 0–55)
AST: 23 U/L (ref 5–34)
Anion Gap: 13 mEq/L — ABNORMAL HIGH (ref 3–11)
BUN: 27.7 mg/dL — AB (ref 7.0–26.0)
CALCIUM: 9 mg/dL (ref 8.4–10.4)
CHLORIDE: 92 meq/L — AB (ref 98–109)
CO2: 27 mEq/L (ref 22–29)
CREATININE: 2 mg/dL — AB (ref 0.6–1.1)
EGFR: 30 mL/min/{1.73_m2} — ABNORMAL LOW (ref >60)
Glucose: 144 mg/dl — ABNORMAL HIGH (ref 70–140)
Potassium: 3.3 mEq/L — ABNORMAL LOW (ref 3.5–5.1)
Sodium: 131 mEq/L — ABNORMAL LOW (ref 136–145)
Total Bilirubin: 0.32 mg/dL (ref 0.20–1.20)
Total Protein: 6.4 g/dL (ref 6.4–8.3)

## 2017-10-09 LAB — CBC WITH DIFFERENTIAL/PLATELET
BASO%: 0.3 % (ref 0.0–2.0)
Basophils Absolute: 0.1 10*3/uL (ref 0.0–0.1)
EOS%: 0.1 % (ref 0.0–7.0)
Eosinophils Absolute: 0 10*3/uL (ref 0.0–0.5)
HEMATOCRIT: 37.1 % (ref 34.8–46.6)
HEMOGLOBIN: 12.4 g/dL (ref 11.6–15.9)
LYMPH#: 2.9 10*3/uL (ref 0.9–3.3)
LYMPH%: 9.3 % — ABNORMAL LOW (ref 14.0–49.7)
MCH: 28.5 pg (ref 25.1–34.0)
MCHC: 33.3 g/dL (ref 31.5–36.0)
MCV: 85.4 fL (ref 79.5–101.0)
MONO#: 0.1 10*3/uL (ref 0.1–0.9)
MONO%: 0.3 % (ref 0.0–14.0)
NEUT%: 90 % — ABNORMAL HIGH (ref 38.4–76.8)
NEUTROS ABS: 28.5 10*3/uL — AB (ref 1.5–6.5)
Platelets: 188 10*3/uL (ref 145–400)
RBC: 4.34 10*6/uL (ref 3.70–5.45)
RDW: 16.5 % — AB (ref 11.2–14.5)
WBC: 31.7 10*3/uL — ABNORMAL HIGH (ref 3.9–10.3)

## 2017-10-09 MED ORDER — DIPHENOXYLATE-ATROPINE 2.5-0.025 MG PO TABS: 1.0000 | ORAL_TABLET | Freq: Four times a day (QID) | ORAL | 0 refills | Status: DC | PRN

## 2017-10-09 MED ORDER — ONDANSETRON HCL 8 MG PO TABS: 8.0000 mg | ORAL_TABLET | Freq: Three times a day (TID) | ORAL | 0 refills | Status: DC | PRN

## 2017-10-09 NOTE — Telephone Encounter (Signed)
Gave patient avs and calendar with appts. Per 10/12 los

## 2017-10-12 ENCOUNTER — Telehealth: Payer: Self-pay

## 2017-10-12 NOTE — Telephone Encounter (Signed)
lvm on provided number regarding her phone msg reporting cold like symptoms

## 2017-10-13 ENCOUNTER — Emergency Department (HOSPITAL_COMMUNITY): Payer: 59

## 2017-10-13 ENCOUNTER — Emergency Department (HOSPITAL_COMMUNITY)
Admission: EM | Admit: 2017-10-13 | Discharge: 2017-10-13 | Disposition: A | Discharge status: Home / Self Care | Payer: 59 | Attending: Emergency Medicine | Admitting: Emergency Medicine

## 2017-10-13 ENCOUNTER — Encounter (HOSPITAL_COMMUNITY): Payer: Self-pay | Admitting: Emergency Medicine

## 2017-10-13 DIAGNOSIS — Z79899 Other long term (current) drug therapy: Secondary | ICD-10-CM | POA: Diagnosis not present

## 2017-10-13 DIAGNOSIS — E039 Hypothyroidism, unspecified: Secondary | ICD-10-CM | POA: Insufficient documentation

## 2017-10-13 DIAGNOSIS — J45909 Unspecified asthma, uncomplicated: Secondary | ICD-10-CM | POA: Diagnosis not present

## 2017-10-13 DIAGNOSIS — R0602 Shortness of breath: Secondary | ICD-10-CM | POA: Diagnosis present

## 2017-10-13 DIAGNOSIS — N179 Acute kidney failure, unspecified: Secondary | ICD-10-CM | POA: Insufficient documentation

## 2017-10-13 DIAGNOSIS — C50911 Malignant neoplasm of unspecified site of right female breast: Secondary | ICD-10-CM | POA: Insufficient documentation

## 2017-10-13 DIAGNOSIS — Z91013 Allergy to seafood: Secondary | ICD-10-CM | POA: Diagnosis not present

## 2017-10-13 DIAGNOSIS — I10 Essential (primary) hypertension: Secondary | ICD-10-CM | POA: Insufficient documentation

## 2017-10-13 DIAGNOSIS — Z9221 Personal history of antineoplastic chemotherapy: Secondary | ICD-10-CM | POA: Diagnosis not present

## 2017-10-13 DIAGNOSIS — E876 Hypokalemia: Secondary | ICD-10-CM

## 2017-10-13 DIAGNOSIS — F17210 Nicotine dependence, cigarettes, uncomplicated: Secondary | ICD-10-CM | POA: Insufficient documentation

## 2017-10-13 DIAGNOSIS — Z91018 Allergy to other foods: Secondary | ICD-10-CM | POA: Insufficient documentation

## 2017-10-13 LAB — COMPREHENSIVE METABOLIC PANEL
ALBUMIN: 3.5 g/dL (ref 3.5–5.0)
ALK PHOS: 110 U/L (ref 38–126)
ALT: 39 U/L (ref 14–54)
ANION GAP: 13 (ref 5–15)
AST: 23 U/L (ref 15–41)
BILIRUBIN TOTAL: 0.2 mg/dL — AB (ref 0.3–1.2)
BUN: 19 mg/dL (ref 6–20)
CALCIUM: 8.6 mg/dL — AB (ref 8.9–10.3)
CO2: 29 mmol/L (ref 22–32)
Chloride: 90 mmol/L — ABNORMAL LOW (ref 101–111)
Creatinine, Ser: 1.73 mg/dL — ABNORMAL HIGH (ref 0.44–1.00)
GFR calc non Af Amer: 29 mL/min — ABNORMAL LOW (ref >60)
GFR, EST AFRICAN AMERICAN: 34 mL/min — AB (ref >60)
Glucose, Bld: 117 mg/dL — ABNORMAL HIGH (ref 65–99)
POTASSIUM: 2.9 mmol/L — AB (ref 3.5–5.1)
SODIUM: 132 mmol/L — AB (ref 135–145)
TOTAL PROTEIN: 6.3 g/dL — AB (ref 6.5–8.1)

## 2017-10-13 LAB — CBC WITH DIFFERENTIAL/PLATELET
Basophils Absolute: 0 10*3/uL (ref 0.0–0.1)
Basophils Relative: 0 %
EOS PCT: 0 %
Eosinophils Absolute: 0 10*3/uL (ref 0.0–0.7)
HEMATOCRIT: 34.3 % — AB (ref 36.0–46.0)
HEMOGLOBIN: 11.5 g/dL — AB (ref 12.0–15.0)
LYMPHS ABS: 2.1 10*3/uL (ref 0.7–4.0)
Lymphocytes Relative: 13 %
MCH: 28.2 pg (ref 26.0–34.0)
MCHC: 33.5 g/dL (ref 30.0–36.0)
MCV: 84.1 fL (ref 78.0–100.0)
MONO ABS: 1.3 10*3/uL — AB (ref 0.1–1.0)
Monocytes Relative: 8 %
NEUTROS PCT: 79 %
Neutro Abs: 12.9 10*3/uL — ABNORMAL HIGH (ref 1.7–7.7)
Platelets: 173 10*3/uL (ref 150–400)
RBC: 4.08 MIL/uL (ref 3.87–5.11)
RDW: 16.2 % — AB (ref 11.5–15.5)
WBC: 16.3 10*3/uL — ABNORMAL HIGH (ref 4.0–10.5)

## 2017-10-13 LAB — I-STAT TROPONIN, ED: Troponin i, poc: 0.03 ng/mL (ref 0.00–0.08)

## 2017-10-13 MED ORDER — SODIUM CHLORIDE 0.9 % IV BOLUS (SEPSIS)
1000.0000 mL | Freq: Once | INTRAVENOUS | Status: AC
  Administered 2017-10-13: 1000 mL via INTRAVENOUS

## 2017-10-13 MED ORDER — POTASSIUM CHLORIDE CRYS ER 20 MEQ PO TBCR
40.0000 meq | EXTENDED_RELEASE_TABLET | Freq: Once | ORAL | Status: AC
  Administered 2017-10-13: 40 meq via ORAL
  Filled 2017-10-13: qty 2

## 2017-10-13 MED ORDER — IOPAMIDOL (ISOVUE-370) INJECTION 76%
100.0000 mL | Freq: Once | INTRAVENOUS | Status: AC | PRN
  Administered 2017-10-13: 80 mL via INTRAVENOUS

## 2017-10-13 MED ORDER — POTASSIUM CHLORIDE CRYS ER 20 MEQ PO TBCR: 20.0000 meq | EXTENDED_RELEASE_TABLET | Freq: Two times a day (BID) | ORAL | 0 refills | Status: DC

## 2017-10-13 MED ORDER — IOPAMIDOL (ISOVUE-370) INJECTION 76%
INTRAVENOUS | Status: AC
  Administered 2017-10-13: 80 mL via INTRAVENOUS
  Filled 2017-10-13: qty 100

## 2017-10-13 NOTE — ED Notes (Signed)
Bed: DV44 Expected date:  Expected time:  Means of arrival:  Comments: 66 yo F/ CA pt shortness of breath

## 2017-10-13 NOTE — ED Provider Notes (Signed)
TIME SEEN: 4:39 AM  CHIEF COMPLAINT: Shortness of breath  HPI: Pt is a 66 year old female with recent diagnosis of right breast cancer in September 2018 who is undergoing chemotherapy with first round on October 4 2 presents to the emergency department with shortness of breath that started last night. She reports feeling congested. She has had a dry cough. No fever. Has a history of asthma and states she has used her heartbeat or inhaler without relief. No history of PE or DVT. No leg swelling or pain. Did have some palpitations and was burping last night and had some central chest discomfort but denies pressure or tightness. Symptoms not worse with exertion. She does not wear oxygen at home. She is a smoker. She has a Port-A-Cath in the right chest.  PCP - Precious Haws Oncologist - Magrinat  ROS: See HPI Constitutional: no fever  Eyes: no drainage  ENT: no runny nose   Cardiovascular:   chest pain  Resp: SOB  GI: no vomiting GU: no dysuria Integumentary: no rash  Allergy: no hives  Musculoskeletal: no leg swelling  Neurological: no slurred speech ROS otherwise negative  PAST MEDICAL HISTORY/PAST SURGICAL HISTORY:  Past Medical History:  Diagnosis Date  . Angina pectoris ([place])   . Asthma   . Cancer ([place])    right breast  . Constipation   . Diverticulitis   . Headache   . HPV in female   . Hyperlipidemia   . Hypertension   . Hypothyroidism   . Wears dentures   . Wears glasses     MEDICATIONS:  Prior to Admission medications   Medication Sig Start Date End Date Taking? Authorizing Provider  albuterol (VENTOLIN HFA) 108 (90 BASE) MCG/ACT inhaler Inhale 1-2 puffs into the lungs every 6 (six) hours as needed for wheezing or shortness of breath. 10/10/14   Tanda Rockers, MD  Alirocumab (PRALUENT) 75 MG/ML SOPN Inject 75 mg into the muscle every 14 (fourteen) days.  08/10/17   [provider]  amLODipine (NORVASC) 10 MG tablet Take 10 mg by mouth daily.  09/01/17    [provider]  cetirizine (ZYRTEC) 10 MG tablet Take 10 mg by mouth as needed for allergies.  08/17/17   [provider]  dexamethasone (DECADRON) 4 MG tablet Take 2 tablets (8 mg total) by mouth 2 (two) times daily. Start the day before Taxotere. Then again the day after chemo for 3 days. 09/09/17   Magrinat, [name], MD  diphenoxylate-atropine (LOMOTIL) 2.5-0.025 MG tablet Take 1 tablet by mouth 4 (four) times daily as needed for diarrhea or loose stools. 10/09/17   Magrinat, [name], MD  HYDROcodone-acetaminophen (NORCO) 5-325 MG tablet Take 1-2 tablets by mouth every 6 (six) hours as needed for moderate pain or severe pain. Patient not taking: Reported on 10/07/2017 09/25/17   [name], MD  levothyroxine (SYNTHROID, LEVOTHROID) 50 MCG tablet Take 50 mcg by mouth daily. 05/27/16   [provider]  lidocaine-prilocaine (EMLA) cream Apply to affected area once 09/09/17   Magrinat, [name], MD  loratadine (CLARITIN) 10 MG tablet Take 1 tablet (10 mg total) by mouth daily. 10/01/17   Magrinat, [name], MD  LORazepam (ATIVAN) 0.5 MG tablet Take 1 tablet (0.5 mg total) by mouth at bedtime as needed (Nausea or vomiting). 09/09/17   Magrinat, [name], MD  losartan-hydrochlorothiazide (HYZAAR) 100-12.5 MG tablet Take 1 tablet by mouth daily.  08/17/17   [provider]  metoprolol succinate (TOPROL-XL) 50 MG 24  hr tablet Take 50 mg by mouth daily.  07/02/17   [provider]  mometasone-formoterol (DULERA) 100-5 MCG/ACT AERO Inhale 2 puffs into the lungs 2 (two) times daily. 01/04/16   Tanda Rockers, MD  nitroGLYCERIN (NITROSTAT) 0.4 MG SL tablet Place 0.4 mg under the tongue every 5 (five) minutes as needed for chest pain.    [provider]  ondansetron (ZOFRAN) 8 MG tablet Take 1 tablet (8 mg total) by mouth every 8 (eight) hours as needed for nausea or vomiting. 10/09/17   Magrinat, [name], MD  prochlorperazine (COMPAZINE) 10 MG tablet Take 1  tablet (10 mg total) by mouth every 6 (six) hours as needed (Nausea or vomiting). 09/09/17   Magrinat, [name], MD    ALLERGIES:  Allergies  Allergen Reactions  . Fish Allergy Anaphylaxis and Swelling    Swelling of hands and feet.  . Peanut-Containing Drug Products Anaphylaxis    Tree nuts included in allergic reaction.  . Ace Inhibitors Other (See Comments) and Cough    Cold like symptoms     SOCIAL HISTORY:  Social History  Substance Use Topics  . Smoking status: Current Every Day Smoker    Packs/day: 0.50    Years: 40.00    Types: Cigarettes  . Smokeless tobacco: Never Used     Comment: Pt already has info  . Alcohol use Yes     Comment: occasional    FAMILY HISTORY: Family History  Problem Relation Age of Onset  . Cancer Mother        rectal  . Hypertension Brother   . Hypertension Brother   . Asthma Son   . Asthma Brother     EXAM: BP 108/69 (BP Location: Left Arm)   Pulse 72   Temp 98.6 F (37 C)   Resp 18   SpO2 99%  CONSTITUTIONAL: Alert and oriented and responds appropriately to questions. Well-appearing; well-nourished HEAD: Normocephalic EYES: Conjunctivae clear, pupils appear equal, EOMI ENT: normal nose; moist mucous membranes NECK: Supple, no meningismus, no nuchal rigidity, no LAD  CARD: RRR; S1 and S2 appreciated; no murmurs, no clicks, no rubs, no gallops CHEST:  Patient has a port in the right chest wall with minimal amount of surrounding erythema and ecchymosis and incision site appears to be healing well and is clean, dry and intact RESP: Normal chest excursion without splinting or tachypnea; breath sounds equal bilaterally, no wheezes, patient does have some mild rhonchi appreciated at the right lung base, no rales, no hypoxia or respiratory distress, speaking full sentences ABD/GI: Normal bowel sounds; non-distended; soft, non-tender, no rebound, no guarding, no peritoneal signs, no hepatosplenomegaly BACK:  The back appears normal and is  non-tender to palpation, there is no CVA tenderness EXT: Normal ROM in all joints; non-tender to palpation; no edema; normal capillary refill; no cyanosis, no calf tenderness or swelling    SKIN: Normal color for age and race; warm; no rash NEURO: Moves all extremities equally PSYCH: The patient's mood and manner are appropriate. Grooming and personal hygiene are appropriate.  MEDICAL DECISION MAKING: Patient here with shortness of breath. She does have some rhonchi at the right lung base. Differential diagnosis includes pneumonia, pleural effusion, PE. Less likely ACS. We'll obtain labs, EKG and a CT of her chest for further evaluation. Patient is currently hemodynamically stable with no respiratory distress or hypoxia. I do not think this is an asthma exacerbation and patient agrees.  ED PROGRESS: Patient's labs show mild history of present  illness. Was creatinine of 2.0 on the 14th and is now down 1.73. Getting a liter of IV fluids here. Potassium slightly low at 2.9 without EKG changes. Given oral replacement. Troponin negative. She has a leukocytosis with left shift but this is improving and she did receive Neulasta prior to her chemotherapy on October 4 which is likely the cause of her leukocytosis.  She has not had any hypoxia here is a leading to the bathroom several times without any respiratory distress, increased work of breathing or hypoxia.  CT scan shows no pulmonary embolus. She has an unchanged metastatic lesion at T8. She has emphysematous changes. No pneumonia, edema, pleural effusion. I feel she is safe to be discharged home. Patient comfortable with this plan. Recommend close PCP follow-up. I will have her continue albuterol as needed. I do not feel she needs steroids at this time. I do not feel she needs antibiotics or admission. I do not think this is her anginal equivalent. Her troponin is negative and symptoms started over 12 hours ago.   At this time, I do not feel there is any  life-threatening condition present. I have reviewed and discussed all results (EKG, imaging, lab, urine as appropriate) and exam findings with patient/family. I have reviewed nursing notes and appropriate previous records.  I feel the patient is safe to be discharged home without further emergent workup and can continue workup as an outpatient as needed. Discussed usual and customary return precautions. Patient/family verbalize understanding and are comfortable with this plan.  Outpatient follow-up has been provided if needed. All questions have been answered.    EKG Interpretation  Date/Time:  Tuesday October 13 2017 05:48:54 EDT Ventricular Rate:  68 PR Interval:    QRS Duration: 109 QT Interval:  407 QTC Calculation: 433 R Axis:   15 Text Interpretation:  Sinus rhythm Multiple ventricular premature complexes Consider left atrial enlargement Confirmed by Pryor Curia [phone]) on 10/13/2017 5:56:17 AM         Eric Morganti, [name], DO 10/13/17 [id number]

## 2017-10-13 NOTE — Discharge Instructions (Signed)
Your CT scan did not show any blood clots, pneumonia, fluid on your lungs. Your labs showed mild kidney injury with a creatinine of 1.7. I recommend you increase your water intake and follow-up with your primary doctor to have this rechecked in one week. Your potassium is also slightly low at 2.9. Your white blood cell count was elevated but this is secondary to a medication you received during chemotherapy. You do not need antibiotics at this time. Please continue to use your albuterol as needed. Your EKG and cardiac labs today were normal.  Please follow-up closely with her primary care doctor.

## 2017-10-13 NOTE — ED Notes (Signed)
ED Provider at bedside. 

## 2017-10-13 NOTE — ED Notes (Signed)
Pt. Refused to access PORT , this Nurse able to start PIV but unable to collect enough blood for lab works.

## 2017-10-13 NOTE — ED Triage Notes (Signed)
Per EMS , pt. From home with complaint of SOB which started last night at 9pm and got worsen upon waking up this morning at 3am. Reported of congestion x1 hr. Denied fever, denied cough. Per EMS , no wheezing noted. Pt.ambulated to  bathroom upon arrival to ED.denied distress. Pt. Has breast CA and ist and last chemo was Oct.4,2018. Alert and oriented x4. Denied chest pain.

## 2017-10-15 ENCOUNTER — Other Ambulatory Visit (HOSPITAL_COMMUNITY): Payer: Self-pay | Admitting: Interventional Radiology

## 2017-10-19 ENCOUNTER — Other Ambulatory Visit (HOSPITAL_COMMUNITY): Payer: Self-pay | Admitting: Interventional Radiology

## 2017-10-19 DIAGNOSIS — C7951 Secondary malignant neoplasm of bone: Secondary | ICD-10-CM

## 2017-10-20 ENCOUNTER — Telehealth (HOSPITAL_COMMUNITY): Payer: Self-pay

## 2017-10-20 NOTE — Telephone Encounter (Signed)
Called to schedule ablation, left message for pt to return call. AW

## 2017-10-21 ENCOUNTER — Other Ambulatory Visit: Payer: Self-pay | Admitting: Oncology

## 2017-10-22 ENCOUNTER — Telehealth: Payer: Self-pay | Admitting: Adult Health

## 2017-10-22 ENCOUNTER — Other Ambulatory Visit (HOSPITAL_BASED_OUTPATIENT_CLINIC_OR_DEPARTMENT_OTHER): Payer: 59

## 2017-10-22 ENCOUNTER — Ambulatory Visit: Payer: 59

## 2017-10-22 ENCOUNTER — Ambulatory Visit (HOSPITAL_BASED_OUTPATIENT_CLINIC_OR_DEPARTMENT_OTHER): Payer: 59

## 2017-10-22 ENCOUNTER — Telehealth (HOSPITAL_COMMUNITY): Payer: Self-pay | Admitting: Vascular Surgery

## 2017-10-22 ENCOUNTER — Other Ambulatory Visit: Payer: Self-pay | Admitting: Adult Health

## 2017-10-22 ENCOUNTER — Ambulatory Visit (HOSPITAL_BASED_OUTPATIENT_CLINIC_OR_DEPARTMENT_OTHER): Payer: 59 | Admitting: Adult Health

## 2017-10-22 ENCOUNTER — Encounter: Payer: Self-pay | Admitting: Adult Health

## 2017-10-22 ENCOUNTER — Other Ambulatory Visit: Payer: Self-pay | Admitting: Oncology

## 2017-10-22 ENCOUNTER — Encounter: Payer: Self-pay | Admitting: *Deleted

## 2017-10-22 VITALS — BP 150/69 | HR 93 | Temp 98.1°F | Resp 18 | Ht 64.0 in | Wt 179.5 lb

## 2017-10-22 DIAGNOSIS — Z5111 Encounter for antineoplastic chemotherapy: Secondary | ICD-10-CM

## 2017-10-22 DIAGNOSIS — Z170 Estrogen receptor positive status [ER+]: Principal | ICD-10-CM

## 2017-10-22 DIAGNOSIS — Z5112 Encounter for antineoplastic immunotherapy: Secondary | ICD-10-CM

## 2017-10-22 DIAGNOSIS — C773 Secondary and unspecified malignant neoplasm of axilla and upper limb lymph nodes: Secondary | ICD-10-CM

## 2017-10-22 DIAGNOSIS — C50411 Malignant neoplasm of upper-outer quadrant of right female breast: Secondary | ICD-10-CM

## 2017-10-22 DIAGNOSIS — E876 Hypokalemia: Secondary | ICD-10-CM

## 2017-10-22 LAB — CBC WITH DIFFERENTIAL/PLATELET
BASO%: 0.7 % (ref 0.0–2.0)
BASOS ABS: 0 10*3/uL (ref 0.0–0.1)
EOS ABS: 0 10*3/uL (ref 0.0–0.5)
EOS%: 0 % (ref 0.0–7.0)
HCT: 31.8 % — ABNORMAL LOW (ref 34.8–46.6)
HEMOGLOBIN: 10.6 g/dL — AB (ref 11.6–15.9)
LYMPH%: 14 % (ref 14.0–49.7)
MCH: 29 pg (ref 25.1–34.0)
MCHC: 33.5 g/dL (ref 31.5–36.0)
MCV: 86.5 fL (ref 79.5–101.0)
MONO#: 0.4 10*3/uL (ref 0.1–0.9)
MONO%: 7.6 % (ref 0.0–14.0)
NEUT#: 4.5 10*3/uL (ref 1.5–6.5)
NEUT%: 77.7 % — ABNORMAL HIGH (ref 38.4–76.8)
Platelets: 355 10*3/uL (ref 145–400)
RBC: 3.67 10*6/uL — AB (ref 3.70–5.45)
RDW: 17.7 % — AB (ref 11.2–14.5)
WBC: 5.8 10*3/uL (ref 3.9–10.3)
lymph#: 0.8 10*3/uL — ABNORMAL LOW (ref 0.9–3.3)

## 2017-10-22 LAB — COMPREHENSIVE METABOLIC PANEL WITH GFR
ALT: 29 U/L (ref 0–55)
AST: 18 U/L (ref 5–34)
Albumin: 3.5 g/dL (ref 3.5–5.0)
Alkaline Phosphatase: 96 U/L (ref 40–150)
Anion Gap: 10 meq/L (ref 3–11)
BUN: 18.6 mg/dL (ref 7.0–26.0)
CO2: 26 meq/L (ref 22–29)
Calcium: 9.8 mg/dL (ref 8.4–10.4)
Chloride: 102 meq/L (ref 98–109)
Creatinine: 1.2 mg/dL — ABNORMAL HIGH (ref 0.6–1.1)
EGFR: 52 ml/min/1.73 m2 — ABNORMAL LOW
Glucose: 140 mg/dL (ref 70–140)
Potassium: 3.7 meq/L (ref 3.5–5.1)
Sodium: 138 meq/L (ref 136–145)
Total Bilirubin: 0.27 mg/dL (ref 0.20–1.20)
Total Protein: 6.6 g/dL (ref 6.4–8.3)

## 2017-10-22 MED ORDER — TRASTUZUMAB CHEMO 150 MG IV SOLR
6.0000 mg/kg | Freq: Once | INTRAVENOUS | Status: AC
  Administered 2017-10-22: 504 mg via INTRAVENOUS
  Filled 2017-10-22: qty 24

## 2017-10-22 MED ORDER — SODIUM CHLORIDE 0.9 % IV SOLN
Freq: Once | INTRAVENOUS | Status: AC
  Administered 2017-10-22: 11:00:00 via INTRAVENOUS

## 2017-10-22 MED ORDER — HEPARIN SOD (PORK) LOCK FLUSH 100 UNIT/ML IV SOLN
500.0000 [IU] | Freq: Once | INTRAVENOUS | Status: AC | PRN
  Administered 2017-10-22: 500 [IU]
  Filled 2017-10-22: qty 5

## 2017-10-22 MED ORDER — SODIUM CHLORIDE 0.9% FLUSH
10.0000 mL | INTRAVENOUS | Status: DC | PRN
  Administered 2017-10-22: 10 mL
  Filled 2017-10-22: qty 10

## 2017-10-22 MED ORDER — POTASSIUM CHLORIDE ER 10 MEQ PO TBCR: 10.0000 meq | EXTENDED_RELEASE_TABLET | Freq: Every day | ORAL | 0 refills | Status: DC

## 2017-10-22 MED ORDER — PEGFILGRASTIM 6 MG/0.6ML ~~LOC~~ PSKT
6.0000 mg | PREFILLED_SYRINGE | Freq: Once | SUBCUTANEOUS | Status: AC
  Administered 2017-10-22: 6 mg via SUBCUTANEOUS
  Filled 2017-10-22: qty 0.6

## 2017-10-22 MED ORDER — SODIUM CHLORIDE 0.9 % IV SOLN
75.0000 mg/m2 | Freq: Once | INTRAVENOUS | Status: AC
  Administered 2017-10-22: 150 mg via INTRAVENOUS
  Filled 2017-10-22: qty 15

## 2017-10-22 MED ORDER — DIPHENHYDRAMINE HCL 25 MG PO CAPS
25.0000 mg | ORAL_CAPSULE | Freq: Once | ORAL | Status: AC
  Administered 2017-10-22: 25 mg via ORAL

## 2017-10-22 MED ORDER — ACETAMINOPHEN 325 MG PO TABS
ORAL_TABLET | ORAL | Status: AC
  Filled 2017-10-22: qty 2

## 2017-10-22 MED ORDER — SODIUM CHLORIDE 0.9 % IV SOLN
Freq: Once | INTRAVENOUS | Status: AC
  Administered 2017-10-22: 11:00:00 via INTRAVENOUS
  Filled 2017-10-22: qty 5

## 2017-10-22 MED ORDER — DIPHENHYDRAMINE HCL 25 MG PO CAPS
ORAL_CAPSULE | ORAL | Status: AC
  Filled 2017-10-22: qty 1

## 2017-10-22 MED ORDER — ACETAMINOPHEN 325 MG PO TABS
650.0000 mg | ORAL_TABLET | Freq: Once | ORAL | Status: AC
  Administered 2017-10-22: 650 mg via ORAL

## 2017-10-22 MED ORDER — PALONOSETRON HCL INJECTION 0.25 MG/5ML
INTRAVENOUS | Status: AC
  Filled 2017-10-22: qty 5

## 2017-10-22 MED ORDER — SODIUM CHLORIDE 0.9 % IV SOLN
425.5000 mg | Freq: Once | INTRAVENOUS | Status: AC
  Administered 2017-10-22: 430 mg via INTRAVENOUS
  Filled 2017-10-22: qty 43

## 2017-10-22 MED ORDER — PALONOSETRON HCL INJECTION 0.25 MG/5ML
0.2500 mg | Freq: Once | INTRAVENOUS | Status: AC
  Administered 2017-10-22: 0.25 mg via INTRAVENOUS

## 2017-10-22 MED ORDER — SODIUM CHLORIDE 0.9 % IV SOLN
420.0000 mg | Freq: Once | INTRAVENOUS | Status: AC
  Administered 2017-10-22: 420 mg via INTRAVENOUS
  Filled 2017-10-22: qty 14

## 2017-10-22 NOTE — Telephone Encounter (Signed)
Spoke to pt to schedule appt , pt is in chemo and she would like to call back tomorrow

## 2017-10-22 NOTE — Patient Instructions (Signed)
[facility] Discharge Instructions for Patients Receiving Chemotherapy  Today you received the following chemotherapy agents:  Herceptin, Perjeta, Taxotere, and Carboplatin.  To help prevent nausea and vomiting after your treatment, we encourage you to take your nausea medication as directed.   If you develop nausea and vomiting that is not controlled by your nausea medication, call the clinic.   BELOW ARE SYMPTOMS THAT SHOULD BE REPORTED IMMEDIATELY:  *FEVER GREATER THAN 100.5 F  *CHILLS WITH OR WITHOUT FEVER  NAUSEA AND VOMITING THAT IS NOT CONTROLLED WITH YOUR NAUSEA MEDICATION  *UNUSUAL SHORTNESS OF BREATH  *UNUSUAL BRUISING OR BLEEDING  TENDERNESS IN MOUTH AND THROAT WITH OR WITHOUT PRESENCE OF ULCERS  *URINARY PROBLEMS  *BOWEL PROBLEMS  UNUSUAL RASH Items with * indicate a potential emergency and should be followed up as soon as possible.  Feel free to call the clinic should you have any questions or concerns. The clinic phone number is [phone].  Please show the CHEMO ALERT CARD at check-in to the Emergency Department and triage nurse.   

## 2017-10-22 NOTE — Progress Notes (Signed)
[place]  [phone] Fax:([phone]) [phone]     ID: Tracie [name] DOB: 08/10/1951  MR#: [id number]  AVW#:[id number]  Patient Care Team: Bartholome Bill, MD as PCP - General (Family Medicine) [name], MD as Consulting Physician (General Surgery) Magrinat, [name], MD as Consulting Physician (Oncology) [name], MD as Attending Physician (Radiation Oncology) OTHER MD:  CHIEF COMPLAINT: Estrogen receptor positive breast cancer  CURRENT TREATMENT: Neoadjuvant chemotherapy/immunotherapy.   HISTORY OF CURRENT ILLNESS: From the original intake note:  Tracie [name] noted a change in her right breast sometime in July 2018. [facility] to report that she had found a "kernel" in her breast. She was advised to see her primary physician which she did. The patient was then set up for bilateral diagnostic mammography with tomography and right breast ultrasonography at [facility] 09/03/2017. The breast density was category I a. In the upper outer quadrant of the right breast there was a 2.1 cm high density mass, which was palpable. Also in the right breast more posteriorly there was a 1.5 cm high density mass which was felt to be a suspicious lymph node. Ultrasound of the right breast confirmed a 2.1 centimeter lobulated solid mass in the right breast upper outer quadrant 15.8 cm from the nipple in the 10:00 radiant. There was a 1.5 cm oval mass in the right axillary tail with a small rounded masses also suspicious for lymph node involvement. A total of 3 suspicious lymph nodes were identified.  On 09/03/2017 the patient underwent biopsy of the breast mass and the suspicious right axillary lymph node. The final pathology ([id number]) found invasive ductal carcinoma, grade 3, in both. Both tumors were estrogen receptor positive at 70-75%, and both were progesterone receptor negative. Both had an elevated proliferation marker at 90%. The mass in the  breast was HER-2 negative, with a signals ratio of 1.25 and the number per cell 1.88. The lymph node mass however was HER-2 positive with a signals ratio of 2.57, and the number per cell 3.60.  The patient's subsequent history is as detailed below.  INTERVAL HISTORY: Tracie [name] returns today for evaluation and treatment of her triple positive breast cancer. Today is day 1 cycle 2 of 6 planned cycles of carboplatin, Taxotere, trastuzumab and Pertuzumab.   REVIEW OF SYSTEMS: Tracie [name] is doing well today.  She did have some nausea and diarrhea following chemotherapy that has resolved.  She denies peripheral neuropathy.  She did have some diarrhea and shortness of breath that led to an ER visit.  She had some lab anomalies with a mild AKI, hypokalemia, and hyponatremia.  Those have resolved.   She has some questions about her upcoming biopsy and ablation of T8.  She also has some questions about her anti nausea medications and wants me to review those with her.  Today she is doing well and a detailed ROS is non contributory.     PAST MEDICAL HISTORY: Past Medical History:  Diagnosis Date  . Angina pectoris ([place])   . Asthma   . Cancer ([place])    right breast  . Constipation   . Diverticulitis   . Headache   . HPV in female   . Hyperlipidemia   . Hypertension   . Hypothyroidism   . Wears dentures   . Wears glasses     PAST SURGICAL HISTORY: Past Surgical History:  Procedure Laterality Date  . CESAREAN SECTION    . COLON SURGERY  2012   sigmoidectomy  .  COLOSTOMY    . COLOSTOMY TAKEDOWN  10/06/11  . PORTACATH PLACEMENT N/A 09/25/2017   Procedure: INSERTION PORT-A-CATH WITH ULTRA SOUND ERAS PATHWAY;  Surgeon: [name], MD;  Location: [facility];  Service: General;  Laterality: N/A;  . TONSILLECTOMY    . TUBAL LIGATION      FAMILY HISTORY Family History  Problem Relation Age of Onset  . Cancer Mother        rectal  . Hypertension Brother   . Hypertension Brother   . Asthma Son   .  Asthma Brother    The patient has little information regarding her father. Her mother died at age 63 from a strange cancer--the patient does not know what it was, it may well have been cervical cancer from her description. The patient has one brother, no sisters. There is no history of breast or ovarian cancer in the family to the patient's knowledge   GYNECOLOGIC HISTORY:  No LMP recorded. Patient is postmenopausal.  menarche age 70, first live birth age 40 the patient is Tracie [name]. She stopped having periods at age 39. She never used oral contraceptives or hormone replacement.   SOCIAL HISTORY:  Works as a Scientist, product/process development. She is divorced. Currently her son Tracie [name] lives with her. He is a Art gallery manager. The 2 other children are Tracie [name], who lives in [place] and works in the theater and media, and Tracie [name] lives in [place], and is vice president of a [employer]. The patient has 5 grandchildren. She is a Psychologist, forensic.     ADVANCED DIRECTIVES: Not in place    HEALTH MAINTENANCE: Social History  Substance Use Topics  . Smoking status: Current Every Day Smoker    Packs/day: 0.50    Years: 40.00    Types: Cigarettes  . Smokeless tobacco: Never Used     Comment: Pt already has info  . Alcohol use Yes     Comment: occasional     Colonoscopy: September 2012   PAP:  Bone density:09/03/2017    Allergies  Allergen Reactions  . Fish Allergy Anaphylaxis and Swelling    Swelling of hands and feet.  . Peanut-Containing Drug Products Anaphylaxis    Tree nuts included in allergic reaction.  . Ace Inhibitors Other (See Comments) and Cough    Cold like symptoms     Current Outpatient Prescriptions  Medication Sig Dispense Refill  . albuterol (VENTOLIN HFA) 108 (90 BASE) MCG/ACT inhaler Inhale 1-2 puffs into the lungs every 6 (six) hours as needed for wheezing or shortness of breath. 1 Inhaler 6  . Alirocumab (PRALUENT) 75 MG/ML SOPN Inject 75 mg into  the muscle every 14 (fourteen) days.     Marland Kitchen amLODipine (NORVASC) 10 MG tablet Take 10 mg by mouth daily.     Marland Kitchen dexamethasone (DECADRON) 4 MG tablet Take 2 tablets (8 mg total) by mouth 2 (two) times daily. Start the day before Taxotere. Then again the day after chemo for 3 days. 30 tablet 1  . diphenoxylate-atropine (LOMOTIL) 2.5-0.025 MG tablet Take 1 tablet by mouth 4 (four) times daily as needed for diarrhea or loose stools. 60 tablet 0  . HYDROcodone-acetaminophen (NORCO) 5-325 MG tablet Take 1-2 tablets by mouth every 6 (six) hours as needed for moderate pain or severe pain. 20 tablet 0  . levothyroxine (SYNTHROID, LEVOTHROID) 50 MCG tablet Take 50 mcg by mouth daily.    Marland Kitchen lidocaine-prilocaine (EMLA) cream Apply to affected area once 30 g 3  .  loratadine (CLARITIN) 10 MG tablet Take 1 tablet (10 mg total) by mouth daily. 20 tablet 3  . LORazepam (ATIVAN) 0.5 MG tablet Take 1 tablet (0.5 mg total) by mouth at bedtime as needed (Nausea or vomiting). 30 tablet 0  . losartan-hydrochlorothiazide (HYZAAR) 100-12.5 MG tablet Take 1 tablet by mouth daily.   3  . metoprolol succinate (TOPROL-XL) 50 MG 24 hr tablet Take 50 mg by mouth daily.   6  . mometasone-formoterol (DULERA) 100-5 MCG/ACT AERO Inhale 2 puffs into the lungs 2 (two) times daily. 3 Inhaler 0  . nitroGLYCERIN (NITROSTAT) 0.4 MG SL tablet Place 0.4 mg under the tongue every 5 (five) minutes as needed for chest pain.    Marland Kitchen ondansetron (ZOFRAN) 8 MG tablet Take 1 tablet (8 mg total) by mouth every 8 (eight) hours as needed for nausea or vomiting. 20 tablet 0  . potassium chloride SA (K-DUR,KLOR-CON) 20 MEQ tablet Take 1 tablet (20 mEq total) by mouth 2 (two) times daily. 10 tablet 0  . prochlorperazine (COMPAZINE) 10 MG tablet Take 1 tablet (10 mg total) by mouth every 6 (six) hours as needed (Nausea or vomiting). 30 tablet 1   No current facility-administered medications for this visit.     OBJECTIVE:   Vitals:   10/22/17 0836  BP:  (!) 150/69  Pulse: 93  Resp: 18  Temp: 98.1 F (36.7 C)  SpO2: 99%     Body mass index is 30.81 kg/m.   Wt Readings from Last 3 Encounters:  10/22/17 179 lb 8 oz (81.4 kg)  10/09/17 176 lb 1.6 oz (79.9 kg)  10/07/17 182 lb (82.6 kg)      ECOG FS:1 - Symptomatic but completely ambulatory GENERAL: Patient is a well appearing female in no acute distress HEENT:  Sclerae anicteric.  Oropharynx clear and moist. No ulcerations or evidence of oropharyngeal candidiasis. Neck is supple.  NODES:  No cervical, supraclavicular, or axillary lymphadenopathy palpated.  BREAST EXAM:  Deferred. LUNGS:  Clear to auscultation bilaterally.  No wheezes or rhonchi. HEART:  Regular rate and rhythm. No murmur appreciated. ABDOMEN:  Soft, nontender.  Positive, normoactive bowel sounds. No organomegaly palpated. MSK:  No focal spinal tenderness to palpation. Full range of motion bilaterally in the upper extremities. EXTREMITIES:  No peripheral edema.   SKIN:  Clear with no obvious rashes or skin changes. No nail dyscrasia. NEURO:  Nonfocal. Well oriented.  Appropriate affect.    LAB RESULTS:  CMP     Component Value Date/Time   NA 138 10/22/2017 0830   K 3.7 10/22/2017 0830   CL 90 (L) 10/13/2017 0553   CO2 26 10/22/2017 0830   GLUCOSE 140 10/22/2017 0830   BUN 18.6 10/22/2017 0830   CREATININE 1.2 (H) 10/22/2017 0830   CALCIUM 9.8 10/22/2017 0830   PROT 6.6 10/22/2017 0830   ALBUMIN 3.5 10/22/2017 0830   AST 18 10/22/2017 0830   ALT 29 10/22/2017 0830   ALKPHOS 96 10/22/2017 0830   BILITOT 0.27 10/22/2017 0830   GFRNONAA 29 (L) 10/13/2017 0553   GFRAA 34 (L) 10/13/2017 0553    No results found for: TOTALPROTELP, ALBUMINELP, A1GS, A2GS, BETS, BETA2SER, GAMS, MSPIKE, SPEI  No results found for: KPAFRELGTCHN, LAMBDASER, KAPLAMBRATIO  Lab Results  Component Value Date   WBC 5.8 10/22/2017   NEUTROABS 4.5 10/22/2017   HGB 10.6 (L) 10/22/2017   HCT 31.8 (L) 10/22/2017   MCV 86.5  10/22/2017   PLT 355 10/22/2017      Chemistry  Component Value Date/Time   NA 138 10/22/2017 0830   K 3.7 10/22/2017 0830   CL 90 (L) 10/13/2017 0553   CO2 26 10/22/2017 0830   BUN 18.6 10/22/2017 0830   CREATININE 1.2 (H) 10/22/2017 0830      Component Value Date/Time   CALCIUM 9.8 10/22/2017 0830   ALKPHOS 96 10/22/2017 0830   AST 18 10/22/2017 0830   ALT 29 10/22/2017 0830   BILITOT 0.27 10/22/2017 0830       No results found for: LABCA2  No components found for: YIFOYD741  No results for input(s): INR in the last 168 hours.  No results found for: LABCA2  No results found for: OIN867  No results found for: EHM094  No results found for: BSJ628  No results found for: CA2729  No components found for: HGQUANT  No results found for: CEA1 / No results found for: CEA1   No results found for: AFPTUMOR  No results found for: [place]  No results found for: PSA1  Appointment on 10/22/2017  Component Date Value Ref Range Status  . WBC 10/22/2017 5.8  3.9 - 10.3 10e3/uL Final  . NEUT# 10/22/2017 4.5  1.5 - 6.5 10e3/uL Final  . HGB 10/22/2017 10.6* 11.6 - 15.9 g/dL Final  . HCT 10/22/2017 31.8* 34.8 - 46.6 % Final  . Platelets 10/22/2017 355  145 - 400 10e3/uL Final  . MCV 10/22/2017 86.5  79.5 - 101.0 fL Final  . MCH 10/22/2017 29.0  25.1 - 34.0 pg Final  . MCHC 10/22/2017 33.5  31.5 - 36.0 g/dL Final  . RBC 10/22/2017 3.67* 3.70 - 5.45 10e6/uL Final  . RDW 10/22/2017 17.7* 11.2 - 14.5 % Final  . lymph# 10/22/2017 0.8* 0.9 - 3.3 10e3/uL Final  . MONO# 10/22/2017 0.4  0.1 - 0.9 10e3/uL Final  . Eosinophils Absolute 10/22/2017 0.0  0.0 - 0.5 10e3/uL Final  . Basophils Absolute 10/22/2017 0.0  0.0 - 0.1 10e3/uL Final  . NEUT% 10/22/2017 77.7* 38.4 - 76.8 % Final  . LYMPH% 10/22/2017 14.0  14.0 - 49.7 % Final  . MONO% 10/22/2017 7.6  0.0 - 14.0 % Final  . EOS% 10/22/2017 0.0  0.0 - 7.0 % Final  . BASO% 10/22/2017 0.7  0.0 - 2.0 % Final  . Sodium  10/22/2017 138  136 - 145 mEq/L Final  . Potassium 10/22/2017 3.7  3.5 - 5.1 mEq/L Final  . Chloride 10/22/2017 102  98 - 109 mEq/L Final  . CO2 10/22/2017 26  22 - 29 mEq/L Final  . Glucose 10/22/2017 140  70 - 140 mg/dl Final   Glucose reference range is for nonfasting patients. Fasting glucose reference range is 70- 100.  Marland Kitchen BUN 10/22/2017 18.6  7.0 - 26.0 mg/dL Final  . Creatinine 10/22/2017 1.2* 0.6 - 1.1 mg/dL Final  . Total Bilirubin 10/22/2017 0.27  0.20 - 1.20 mg/dL Final  . Alkaline Phosphatase 10/22/2017 96  40 - 150 U/L Final  . AST 10/22/2017 18  5 - 34 U/L Final  . ALT 10/22/2017 29  0 - 55 U/L Final  . Total Protein 10/22/2017 6.6  6.4 - 8.3 g/dL Final  . Albumin 10/22/2017 3.5  3.5 - 5.0 g/dL Final  . Calcium 10/22/2017 9.8  8.4 - 10.4 mg/dL Final  . Anion Gap 10/22/2017 10  3 - 11 mEq/L Final  . EGFR 10/22/2017 52* >60 ml/min/1.73 m2 Final   eGFR is calculated using the CKD-EPI Creatinine Equation (2009)    (this displays  the last labs from the last 3 days)  No results found for: TOTALPROTELP, ALBUMINELP, A1GS, A2GS, BETS, BETA2SER, GAMS, MSPIKE, SPEI (this displays SPEP labs)  No results found for: KPAFRELGTCHN, LAMBDASER, KAPLAMBRATIO (kappa/lambda light chains)  No results found for: HGBA, HGBA2QUANT, HGBFQUANT, HGBSQUAN (Hemoglobinopathy evaluation)   No results found for: LDH  No results found for: IRON, TIBC, IRONPCTSAT (Iron and TIBC)  No results found for: FERRITIN  Urinalysis    Component Value Date/Time   LABSPEC 1.020 02/10/2011 2058   PHURINE 6.0 02/10/2011 2058   HGBUR LARGE (A) 02/10/2011 2058   BILIRUBINUR MODERATE (A) 02/10/2011 2058   KETONESUR 15 (A) 02/10/2011 2058   PROTEINUR >=300 (A) 02/10/2011 2058   UROBILINOGEN >=8.0 02/10/2011 2058   NITRITE NEGATIVE 02/10/2011 2058   LEUKOCYTESUR  02/10/2011 2058    NEGATIVE Biochemical Testing Only. Please order routine urinalysis from main lab if confirmatory testing is needed.      STUDIES: Reviewed zoster vaccine documentation with patient  ELIGIBLE FOR AVAILABLE RESEARCH PROTOCOL: no  ASSESSMENT: 66 y.o. [place] woman status post right breast upper outer quadrant and right axillary lymph node biopsy 09/03/2017, both positive for invasive ductal carcinoma, grade 3, estrogen receptor positive, progesterone receptor negative, with an MIB-1 of 90%, the lymph node being HER-2 positive, the breast mass HER-2 negative  (1) neoadjuvant chemotherapy with carboplatin, docetaxel, trastuzumab, and pertuzumab started 10/01/2017  (2) trastuzumab to continue to complete 12 months  (3) definitive surgery to follow chemotherapy  (4) adjuvant radiation to follow  (5) anti-estrogens to start at the completion of local treatment  PLAN:  Tracie [name] is doing well today.  I reviewed her lab work with her in detail.  She will proceed with treatment today.  I reviewed her nausea meds with her in detail, and had Cecille Rubin, my nurse re-write out her map with instructions because her paper is crumbled up and she wants a fresh copy and everything re written on it.  I want to see Selin next week to make sure she isn't getting dehydrated again.  I went ahead and refilled her potassium.  She doesn't need it right now, but may very well next week if she starts getting dehydrated.  I told her how to drink fluids if she starts to get diarrhea, and to of course call us so we can evaluate her and hopefully prevent an ER visit.  If she has an issue like she did last time, we may need to consider holding Pertuzumab for future cycles.    I reviewed to the best of my ability her upcoming procedure with Dr. Laurence Ferrari, and encouraged her to call their office for further discussion if she has any additional questions.    I also sent a message to Kevan Rosebush, RN at the cardio oncology clinic with Dr. Aundra Dubin.  He had suggested patient f/u with him and undergo echo after 6 weeks instead of 12 due to her low  normal echo at the start of treatment.  That has not yet been scheduled.  I reviewed that with Villa and explained that she may get a call from their office to be seen in the next couple of weeks.    The above was reviewed in detail.  All questions answered, she verbalizes understanding of the above.  She knows to call for any other problems that may develop before the next visit.  A total of (30) minutes of face-to-face time was spent with this patient with greater than 50% of that time  in counseling and care-coordination.    Wilber Bihari, NP  10/22/17 9:14 AM Medical Oncology and Hematology [facility] [address] [place], [place] [address] Tel. [phone]    Fax. [phone]

## 2017-10-22 NOTE — Telephone Encounter (Signed)
Gave patient AVs and calendar of upcoming November appointments.  °

## 2017-10-27 NOTE — Progress Notes (Signed)
[facility]  [phone] Fax:(336) [phone]     ID: Tracie [name] DOB: 31-Mar-1951  MR#: [id number]  IOX#:[id number]  Patient Care Team: Bartholome Bill, MD as PCP - General (Family Medicine) [name], MD as Consulting Physician (General Surgery) Millenia Waldvogel, [name], MD as Consulting Physician (Oncology) [name], MD as Attending Physician (Radiation Oncology) OTHER MD:  CHIEF COMPLAINT: Triple positive breast cancer  CURRENT TREATMENT: Neoadjuvant chemotherapy/immunotherapy.   HISTORY OF CURRENT ILLNESS: From the original intake note:  Tracie [name] noted a change in her right breast sometime in July 2018. [facility] to report that she had found a "kernel" in her breast. She was advised to see her primary physician which she did. The patient was then set up for bilateral diagnostic mammography with tomography and right breast ultrasonography at [facility] 09/03/2017. The breast density was category I a. In the upper outer quadrant of the right breast there was a 2.1 cm high density mass, which was palpable. Also in the right breast more posteriorly there was a 1.5 cm high density mass which was felt to be a suspicious lymph node. Ultrasound of the right breast confirmed a 2.1 centimeter lobulated solid mass in the right breast upper outer quadrant 15.8 cm from the nipple in the 10:00 radiant. There was a 1.5 cm oval mass in the right axillary tail with a small rounded masses also suspicious for lymph node involvement. A total of 3 suspicious lymph nodes were identified.  On 09/03/2017 the patient underwent biopsy of the breast mass and the suspicious right axillary lymph node. The final pathology ([id number]) found invasive ductal carcinoma, grade 3, in both. Both tumors were estrogen receptor positive at 70-75%, and both were progesterone receptor negative. Both had an elevated proliferation marker at 90%. The mass in the breast was HER-2  negative, with a signals ratio of 1.25 and the number per cell 1.88. The lymph node mass however was HER-2 positive with a signals ratio of 2.57, and the number per cell 3.60.  The patient's subsequent history is as detailed below.  INTERVAL HISTORY: Tracie [name] returns today for follow-up and treatment of her triple positive breast cancer, which is being treated neo-adjuvantly. Today is day 8 cycle 2 of 6 planned cycles of carboplatin, docetaxel, trastuzumab, and Pertuzumab. She reports the first few days after treatment she felt fine. By the third or fourth day she started to feel nausea and reports gagging, but no emesis. She also notes that she will have 2-4 normal bowel movements the first day and than after that they become more loose. The most she had in one day recently, was 5 bowel movements.    REVIEW OF SYSTEMS: Tracie [name] has had absolutely no peripheral neuropathy symptoms so far.  She said mild angular cheilitis.  She is having a little peeling of the very tips of her fingers.  She has had no epic for him.  She is still struggling to stay hydrated. She enjoys crushed ice and she is going to try eating more watermelon. She sits all day long at work. Pt also lives on the third floor and doesn't have an elevator.  This worries her because of the upcoming biopsy procedure to T8.  She denies unusual headaches, visual changes, nausea, vomiting, or dizziness. There has been no unusual cough, phlegm production, or pleurisy. This been no change in bowel or bladder habits. She denies unexplained fatigue or unexplained weight loss, bleeding, rash, or fever. A detailed review  of systems was otherwise entirely stable.    PAST MEDICAL HISTORY: Past Medical History:  Diagnosis Date  . Angina pectoris ([place])   . Asthma   . Cancer ([place])    right breast  . Constipation   . Diverticulitis   . Headache   . HPV in female   . Hyperlipidemia   . Hypertension   . Hypothyroidism   . Wears dentures   . Wears  glasses     PAST SURGICAL HISTORY: Past Surgical History:  Procedure Laterality Date  . CESAREAN SECTION    . COLON SURGERY  2012   sigmoidectomy  . COLOSTOMY    . COLOSTOMY TAKEDOWN  10/06/11  . IR RADIOLOGIST EVAL & MGMT  10/07/2017  . PORTACATH PLACEMENT N/A 09/25/2017   Procedure: INSERTION PORT-A-CATH WITH ULTRA SOUND ERAS PATHWAY;  Surgeon: [name], MD;  Location: [facility];  Service: General;  Laterality: N/A;  . TONSILLECTOMY    . TUBAL LIGATION      FAMILY HISTORY Family History  Problem Relation Age of Onset  . Cancer Mother        rectal  . Hypertension Brother   . Hypertension Brother   . Asthma Son   . Asthma Brother    The patient has little information regarding her father. Her mother died at age 25 from a strange cancer--the patient does not know what it was, it may well have been cervical cancer from her description. The patient has one brother, no sisters. There is no history of breast or ovarian cancer in the family to the patient's knowledge   GYNECOLOGIC HISTORY:  No LMP recorded. Patient is postmenopausal.  menarche age 54, first live birth age 41 the patient is [place] P3. She stopped having periods at age 71. She never used oral contraceptives or hormone replacement.   SOCIAL HISTORY:  Works as a Scientist, product/process development. She is divorced. Currently her son Tracie [name] lives with her. He is a Art gallery manager. The 2 other children are Tracie [name], who lives in [place] and works in the theater and media, and Tracie [name] lives in [place], and is vice president of a [employer]. The patient has 5 grandchildren. She is a Psychologist, forensic.     ADVANCED DIRECTIVES: Not in place    HEALTH MAINTENANCE: Social History  Substance Use Topics  . Smoking status: Current Every Day Smoker    Packs/day: 0.50    Years: 40.00    Types: Cigarettes  . Smokeless tobacco: Never Used     Comment: Pt already has info  . Alcohol use Yes     Comment:  occasional     Colonoscopy: September 2012   PAP:  Bone density:09/03/2017    Allergies  Allergen Reactions  . Fish Allergy Anaphylaxis and Swelling    Swelling of hands and feet.  . Peanut-Containing Drug Products Anaphylaxis    Tree nuts included in allergic reaction.  . Ace Inhibitors Other (See Comments) and Cough    Cold like symptoms     Current Outpatient Prescriptions  Medication Sig Dispense Refill  . albuterol (VENTOLIN HFA) 108 (90 BASE) MCG/ACT inhaler Inhale 1-2 puffs into the lungs every 6 (six) hours as needed for wheezing or shortness of breath. 1 Inhaler 6  . Alirocumab (PRALUENT) 75 MG/ML SOPN Inject 75 mg into the muscle every 14 (fourteen) days.     Marland Kitchen amLODipine (NORVASC) 10 MG tablet Take 10 mg by mouth daily.     Marland Kitchen  dexamethasone (DECADRON) 4 MG tablet Take 2 tablets (8 mg total) by mouth 2 (two) times daily. Start the day before Taxotere. Then again the day after chemo for 3 days. 30 tablet 1  . diphenoxylate-atropine (LOMOTIL) 2.5-0.025 MG tablet Take 1 tablet by mouth 4 (four) times daily as needed for diarrhea or loose stools. 60 tablet 0  . HYDROcodone-acetaminophen (NORCO) 5-325 MG tablet Take 1-2 tablets by mouth every 6 (six) hours as needed for moderate pain or severe pain. 20 tablet 0  . levothyroxine (SYNTHROID, LEVOTHROID) 50 MCG tablet Take 50 mcg by mouth daily.    Marland Kitchen lidocaine-prilocaine (EMLA) cream Apply to affected area once 30 g 3  . loratadine (CLARITIN) 10 MG tablet Take 1 tablet (10 mg total) by mouth daily. 20 tablet 3  . LORazepam (ATIVAN) 0.5 MG tablet Take 1 tablet (0.5 mg total) by mouth at bedtime as needed (Nausea or vomiting). 30 tablet 0  . losartan-hydrochlorothiazide (HYZAAR) 100-12.5 MG tablet Take 1 tablet by mouth daily.   3  . metoprolol succinate (TOPROL-XL) 50 MG 24 hr tablet Take 50 mg by mouth daily.   6  . mometasone-formoterol (DULERA) 100-5 MCG/ACT AERO Inhale 2 puffs into the lungs 2 (two) times daily. 3 Inhaler 0  .  nitroGLYCERIN (NITROSTAT) 0.4 MG SL tablet Place 0.4 mg under the tongue every 5 (five) minutes as needed for chest pain.    Marland Kitchen ondansetron (ZOFRAN) 8 MG tablet Take 1 tablet (8 mg total) by mouth every 8 (eight) hours as needed for nausea or vomiting. 20 tablet 0  . potassium chloride (K-DUR) 10 MEQ tablet Take 1 tablet (10 mEq total) by mouth daily. 30 tablet 0  . prochlorperazine (COMPAZINE) 10 MG tablet Take 1 tablet (10 mg total) by mouth every 6 (six) hours as needed (Nausea or vomiting). 30 tablet 1   No current facility-administered medications for this visit.     OBJECTIVE: Middle-aged African-American woman who appears stated age  36:   10/29/17 1131  BP: 126/63  Pulse: 91  Resp: 18  Temp: 98.2 F (36.8 C)  SpO2: 95%     Body mass index is 30.11 kg/m.   Wt Readings from Last 3 Encounters:  10/29/17 175 lb 6.4 oz (79.6 kg)  10/22/17 179 lb 8 oz (81.4 kg)  10/09/17 176 lb 1.6 oz (79.9 kg)      ECOG FS:1 - Symptomatic but completely ambulatory  Sclerae unicteric, EOMs intact Oropharynx clear and moist No cervical or supraclavicular adenopathy Lungs no rales or rhonchi Heart regular rate and rhythm Abd soft, nontender, positive bowel sounds MSK no focal spinal tenderness, no upper extremity lymphedema Neuro: nonfocal, well oriented, appropriate affect Breasts: I do not feel a mass in either breast.  This is very favorable.  Both axillae are benign.    LAB RESULTS:  CMP     Component Value Date/Time   NA 138 10/22/2017 0830   K 3.7 10/22/2017 0830   CL 90 (L) 10/13/2017 0553   CO2 26 10/22/2017 0830   GLUCOSE 140 10/22/2017 0830   BUN 18.6 10/22/2017 0830   CREATININE 1.2 (H) 10/22/2017 0830   CALCIUM 9.8 10/22/2017 0830   PROT 6.6 10/22/2017 0830   ALBUMIN 3.5 10/22/2017 0830   AST 18 10/22/2017 0830   ALT 29 10/22/2017 0830   ALKPHOS 96 10/22/2017 0830   BILITOT 0.27 10/22/2017 0830   GFRNONAA 29 (L) 10/13/2017 0553   GFRAA 34 (L) 10/13/2017 0553      No  results found for: TOTALPROTELP, ALBUMINELP, A1GS, A2GS, BETS, BETA2SER, GAMS, MSPIKE, SPEI  No results found for: KPAFRELGTCHN, LAMBDASER, [facility]  Lab Results  Component Value Date   WBC 10.4 (H) 10/29/2017   NEUTROABS 6.9 (H) 10/29/2017   HGB 10.7 (L) 10/29/2017   HCT 32.5 (L) 10/29/2017   MCV 87.3 10/29/2017   PLT 160 10/29/2017      Chemistry      Component Value Date/Time   NA 138 10/22/2017 0830   K 3.7 10/22/2017 0830   CL 90 (L) 10/13/2017 0553   CO2 26 10/22/2017 0830   BUN 18.6 10/22/2017 0830   CREATININE 1.2 (H) 10/22/2017 0830      Component Value Date/Time   CALCIUM 9.8 10/22/2017 0830   ALKPHOS 96 10/22/2017 0830   AST 18 10/22/2017 0830   ALT 29 10/22/2017 0830   BILITOT 0.27 10/22/2017 0830       No results found for: LABCA2  No components found for: [id number]  No results for input(s): INR in the last 168 hours.  No results found for: LABCA2  No results found for: ZCH885  No results found for: OYD741  No results found for: OIN867  No results found for: CA2729  No components found for: HGQUANT  No results found for: CEA1 / No results found for: CEA1   No results found for: AFPTUMOR  No results found for: [place]  No results found for: PSA1  Appointment on 10/29/2017  Component Date Value Ref Range Status  . WBC 10/29/2017 10.4* 3.9 - 10.3 10e3/uL Final  . NEUT# 10/29/2017 6.9* 1.5 - 6.5 10e3/uL Final  . HGB 10/29/2017 10.7* 11.6 - 15.9 g/dL Final  . HCT 10/29/2017 32.5* 34.8 - 46.6 % Final  . Platelets 10/29/2017 160  145 - 400 10e3/uL Final  . MCV 10/29/2017 87.3  79.5 - 101.0 fL Final  . MCH 10/29/2017 28.8  25.1 - 34.0 pg Final  . MCHC 10/29/2017 33.0  31.5 - 36.0 g/dL Final  . RBC 10/29/2017 3.72  3.70 - 5.45 10e6/uL Final  . RDW 10/29/2017 17.5* 11.2 - 14.5 % Final  . lymph# 10/29/2017 1.5  0.9 - 3.3 10e3/uL Final  . MONO# 10/29/2017 2.0* 0.1 - 0.9 10e3/uL Final  . Eosinophils Absolute 10/29/2017 0.1  0.0  - 0.5 10e3/uL Final  . Basophils Absolute 10/29/2017 0.0  0.0 - 0.1 10e3/uL Final  . NEUT% 10/29/2017 65.8  38.4 - 76.8 % Final  . LYMPH% 10/29/2017 14.4  14.0 - 49.7 % Final  . MONO% 10/29/2017 18.9* 0.0 - 14.0 % Final  . EOS% 10/29/2017 0.7  0.0 - 7.0 % Final  . BASO% 10/29/2017 0.2  0.0 - 2.0 % Final    (this displays the last labs from the last 3 days)  No results found for: TOTALPROTELP, ALBUMINELP, A1GS, A2GS, BETS, BETA2SER, GAMS, MSPIKE, SPEI (this displays SPEP labs)  No results found for: KPAFRELGTCHN, LAMBDASER, KAPLAMBRATIO (kappa/lambda light chains)  No results found for: HGBA, HGBA2QUANT, HGBFQUANT, HGBSQUAN (Hemoglobinopathy evaluation)   No results found for: LDH  No results found for: IRON, TIBC, IRONPCTSAT (Iron and TIBC)  No results found for: FERRITIN  Urinalysis    Component Value Date/Time   LABSPEC 1.020 02/10/2011 2058   PHURINE 6.0 02/10/2011 2058   HGBUR LARGE (A) 02/10/2011 2058   BILIRUBINUR MODERATE (A) 02/10/2011 2058   KETONESUR 15 (A) 02/10/2011 2058   PROTEINUR >=300 (A) 02/10/2011 2058   UROBILINOGEN >=8.0 02/10/2011 2058   NITRITE NEGATIVE 02/10/2011 2058  LEUKOCYTESUR  02/10/2011 2058    NEGATIVE Biochemical Testing Only. Please order routine urinalysis from main lab if confirmatory testing is needed.     STUDIES:  Dg Cervical Spine 2-3 Views  Result Date: 09/29/2017 CLINICAL DATA:  66 y/o female with right side breast cancer. Recent whole-body bone scan with increased activity in the mid cervical spine. EXAM: CERVICAL SPINE - 2-3 VIEW COMPARISON:  Whole-body bone scan 09/24/2017. CT chest abdomen and pelvis 09/24/2017. FINDINGS: Normal prevertebral soft tissue contour. Straightening of cervical lordosis. Cervicothoracic junction alignment is within normal limits. Mild dextroconvex cervical spine curvature. Preserved C1-C2 alignment and joint spaces. In general bone mineralization in the cervical spine is within normal limits for  age. There is moderate to severe disc space loss and endplate spurring H3-Z1 and C5-C6. Partially visible right chest porta cath. Otherwise negative lung apices. Relatively bulky cervical carotid calcified atherosclerosis on the left. IMPRESSION: 1. Advanced cervical disc and endplate degeneration at C4-C5 and C5-C6 felt to explain the recent bone scan activity on the basis of degenerative etiology. 2. Left cervical carotid calcified atherosclerosis. Electronically Signed   By: Genevie Ann M.D.   On: 09/29/2017 16:57   Dg Elbow 2 Views Left  Result Date: 09/29/2017 CLINICAL DATA:  Breast cancer. No injury. Pain involving the olecranon process. EXAM: LEFT ELBOW - 2 VIEW COMPARISON:  None. FINDINGS: The lateral radiograph is minimally degraded due to obliquity. No fracture or elbow joint effusion. Moderate degenerative change involving the humeral-ulnar joint with articular surface irregularity, subchondral sclerosis and osteophytosis. There is a small ossicle adjacent to the coronoid process of the ulna, likely the sequela of remote avulsive injury. Regional soft tissues appear normal.  No radiopaque foreign body. IMPRESSION: 1. No definite acute findings. 2. Moderate degenerative change of the humeral-ulnar joint, potentially the sequela of remote injury. Clinical correlation is advised. Electronically Signed   By: Sandi Mariscal M.D.   On: 09/29/2017 16:57   Ct Angio Chest Pe W And/or Wo Contrast  Result Date: 10/13/2017 CLINICAL DATA:  Shortness of breath. Concern for pulmonary embolism. History of breast cancer, currently on chemotherapy. EXAM: CT ANGIOGRAPHY CHEST WITH CONTRAST TECHNIQUE: Multidetector CT imaging of the chest was performed using the standard protocol during bolus administration of intravenous contrast. Multiplanar CT image reconstructions and MIPs were obtained to evaluate the vascular anatomy. CONTRAST:  80 cc Isovue 370 intravenous contrast. COMPARISON:  CT chest, abdomen, and pelvis dated  September 24, 2017. FINDINGS: Cardiovascular: Satisfactory opacification of the pulmonary arteries to the segmental level. No evidence of pulmonary embolism. Heart size is borderline enlarged. No pericardial effusion. Coronary, aortic arch, and branch vessel atherosclerotic vascular disease. Right chest wall Port-A-Cath with tip at the cavoatrial junction, unchanged Mediastinum/Nodes: Unchanged 9 mm right subpectoral lymph node. No enlarged mediastinal, hilar, or left axillary lymph nodes. Thyroid gland, trachea, and esophagus demonstrate no significant findings. Lungs/Pleura: Mild upper lobe centrilobular emphysema. Bibasilar atelectasis/scarring. No consolidation, pleural effusion, or pneumothorax. Upper Abdomen: No acute abnormality. Unchanged right adrenal adenoma. Musculoskeletal: Unchanged 2.4 cm lytic lesion in the T8 vertebral body. Review of the MIP images confirms the above findings. IMPRESSION: 1. No evidence of pulmonary embolism. No acute intrathoracic process. 2. Unchanged 9 mm round right subpectoral lymph node and 2.4 cm lytic lesion in the T8 vertebral body, suspicious for metastases. 3. Mild emphysema (ICD10-J43.9). 4.  Aortic atherosclerosis (ICD10-I70.0). Electronically Signed   By: Titus Dubin M.D.   On: 10/13/2017 07:48   Ir Radiologist Eval & Mgmt  Result  Date: 10/28/2017 Please refer to notes tab for details about interventional procedure. (Op Note)    ELIGIBLE FOR AVAILABLE RESEARCH PROTOCOL: no  ASSESSMENT: 66 y.o. [place] woman status post right breast upper outer quadrant and right axillary lymph node biopsy 09/03/2017, both positive for invasive ductal carcinoma, grade 3, estrogen receptor positive, progesterone receptor negative, with an MIB-1 of 90%, the lymph node being HER-2 positive, the breast mass HER-2 negative  (a) staging studies September 24, 2017 show a lytic lesion in T8, possible areas of concern at L1-L2  (b) osteochondral/biopsy procedure of T8 lesion  scheduled for November 02, 2017  (1) neoadjuvant chemotherapy with carboplatin, docetaxel, trastuzumab, and pertuzumab started 10/01/2017  (2) trastuzumab to continue to complete 12 months  (3) definitive surgery to follow chemotherapy  (4) adjuvant radiation to follow  (5) anti-estrogens to start at the completion of local treatment  PLAN:  Tracie [name] is tolerating her complex treatment well and she already seems to be having quite a good response, with no palpable breast mass at present after only 2 cycles of chemo.  She understands we still have to go through 6 cycles, even if we documented a complete radiologic response before that, because we will we are trying to do sterilizer peripheral disease if possible.  She will have her osteochondral/T8 biopsy next Monday.  She is worried that she is not going to be able to climb the stairs to her home (there is no elevator) and of course she sits a lot at work and that may also be uncomfortable.  She may need to be off for some period and perhaps on disability for 6 months or until she completes her chemotherapy  I offered her some hydration on the Monday after chemo, but she thinks she is going to be able to do a good job orally and that of course is preferred  She already has a return appointment here November 15 for cycle #3.  She knows to call for any other issues that may develop before that visit.  Fadil Macmaster, [name], MD  10/29/17 11:49 AM Medical Oncology and Hematology [facility] [address] [place], [place] [address] Tel. [phone]    Fax. [phone]  This document serves as a record of services personally performed by Chauncey Cruel, MD. It was created on his behalf by Margit Banda, a trained medical scribe. The creation of this record is based on the scribe's personal observations and the provider's statements to them. This document has been checked and approved by the attending provider.

## 2017-10-28 ENCOUNTER — Encounter: Payer: Self-pay | Admitting: Oncology

## 2017-10-28 ENCOUNTER — Encounter: Payer: Self-pay | Admitting: Interventional Radiology

## 2017-10-28 ENCOUNTER — Other Ambulatory Visit: Payer: Self-pay

## 2017-10-28 DIAGNOSIS — C50411 Malignant neoplasm of upper-outer quadrant of right female breast: Secondary | ICD-10-CM

## 2017-10-28 DIAGNOSIS — Z170 Estrogen receptor positive status [ER+]: Principal | ICD-10-CM

## 2017-10-29 ENCOUNTER — Other Ambulatory Visit (HOSPITAL_BASED_OUTPATIENT_CLINIC_OR_DEPARTMENT_OTHER): Payer: 59

## 2017-10-29 ENCOUNTER — Ambulatory Visit (HOSPITAL_BASED_OUTPATIENT_CLINIC_OR_DEPARTMENT_OTHER): Payer: 59 | Admitting: Oncology

## 2017-10-29 ENCOUNTER — Encounter: Payer: Self-pay | Admitting: Oncology

## 2017-10-29 VITALS — BP 126/63 | HR 91 | Temp 98.2°F | Resp 18 | Ht 64.0 in | Wt 175.4 lb

## 2017-10-29 DIAGNOSIS — K130 Diseases of lips: Secondary | ICD-10-CM | POA: Diagnosis not present

## 2017-10-29 DIAGNOSIS — C773 Secondary and unspecified malignant neoplasm of axilla and upper limb lymph nodes: Secondary | ICD-10-CM | POA: Diagnosis not present

## 2017-10-29 DIAGNOSIS — Z170 Estrogen receptor positive status [ER+]: Secondary | ICD-10-CM | POA: Diagnosis not present

## 2017-10-29 DIAGNOSIS — C50411 Malignant neoplasm of upper-outer quadrant of right female breast: Secondary | ICD-10-CM

## 2017-10-29 LAB — CBC WITH DIFFERENTIAL/PLATELET
BASO%: 0.2 % (ref 0.0–2.0)
BASOS ABS: 0 10*3/uL (ref 0.0–0.1)
EOS%: 0.7 % (ref 0.0–7.0)
Eosinophils Absolute: 0.1 10*3/uL (ref 0.0–0.5)
HCT: 32.5 % — ABNORMAL LOW (ref 34.8–46.6)
HEMOGLOBIN: 10.7 g/dL — AB (ref 11.6–15.9)
LYMPH%: 14.4 % (ref 14.0–49.7)
MCH: 28.8 pg (ref 25.1–34.0)
MCHC: 33 g/dL (ref 31.5–36.0)
MCV: 87.3 fL (ref 79.5–101.0)
MONO#: 2 10*3/uL — ABNORMAL HIGH (ref 0.1–0.9)
MONO%: 18.9 % — AB (ref 0.0–14.0)
NEUT%: 65.8 % (ref 38.4–76.8)
NEUTROS ABS: 6.9 10*3/uL — AB (ref 1.5–6.5)
Platelets: 160 10*3/uL (ref 145–400)
RBC: 3.72 10*6/uL (ref 3.70–5.45)
RDW: 17.5 % — AB (ref 11.2–14.5)
WBC: 10.4 10*3/uL — AB (ref 3.9–10.3)
lymph#: 1.5 10*3/uL (ref 0.9–3.3)

## 2017-10-29 LAB — COMPREHENSIVE METABOLIC PANEL
ALBUMIN: 3.5 g/dL (ref 3.5–5.0)
ALK PHOS: 105 U/L (ref 40–150)
ALT: 48 U/L (ref 0–55)
AST: 25 U/L (ref 5–34)
Anion Gap: 7 mEq/L (ref 3–11)
BUN: 10.8 mg/dL (ref 7.0–26.0)
CO2: 28 meq/L (ref 22–29)
Calcium: 9.1 mg/dL (ref 8.4–10.4)
Chloride: 96 mEq/L — ABNORMAL LOW (ref 98–109)
Creatinine: 0.8 mg/dL (ref 0.6–1.1)
EGFR: >60 mL/min/{1.73_m2} (ref >60)
GLUCOSE: 99 mg/dL (ref 70–140)
POTASSIUM: 4 meq/L (ref 3.5–5.1)
SODIUM: 132 meq/L — AB (ref 136–145)
Total Bilirubin: 0.41 mg/dL (ref 0.20–1.20)
Total Protein: 6.3 g/dL — ABNORMAL LOW (ref 6.4–8.3)

## 2017-10-29 MED ORDER — ICAPS AREDS 2 PO CAPS: 1.0000 | ORAL_CAPSULE | Freq: Two times a day (BID) | ORAL | 3 refills | Status: DC

## 2017-10-29 MED ORDER — EUCERIN EX CREA: TOPICAL_CREAM | CUTANEOUS | 0 refills | Status: DC | PRN

## 2017-10-29 NOTE — Addendum Note (Signed)
Addended by: Chauncey Cruel on: 10/29/2017 11:59 AM   Modules accepted: Orders

## 2017-10-29 NOTE — Progress Notes (Signed)
Received message from nurse by patient from my chart  with billing questions. Called patient no answer. Left message to call me back.

## 2017-10-30 ENCOUNTER — Other Ambulatory Visit: Payer: Self-pay | Admitting: Radiology

## 2017-11-02 ENCOUNTER — Telehealth: Payer: Self-pay | Admitting: Oncology

## 2017-11-02 ENCOUNTER — Ambulatory Visit (HOSPITAL_COMMUNITY)
Admission: RE | Admit: 2017-11-02 | Discharge: 2017-11-02 | Disposition: A | Discharge status: Home / Self Care | Payer: 59 | Source: Ambulatory Visit | Attending: Interventional Radiology | Admitting: Interventional Radiology

## 2017-11-02 ENCOUNTER — Other Ambulatory Visit (HOSPITAL_COMMUNITY): Payer: Self-pay | Admitting: Interventional Radiology

## 2017-11-02 ENCOUNTER — Encounter (HOSPITAL_COMMUNITY): Payer: Self-pay

## 2017-11-02 DIAGNOSIS — E785 Hyperlipidemia, unspecified: Secondary | ICD-10-CM | POA: Diagnosis not present

## 2017-11-02 DIAGNOSIS — J45909 Unspecified asthma, uncomplicated: Secondary | ICD-10-CM | POA: Insufficient documentation

## 2017-11-02 DIAGNOSIS — I10 Essential (primary) hypertension: Secondary | ICD-10-CM | POA: Insufficient documentation

## 2017-11-02 DIAGNOSIS — M8458XA Pathological fracture in neoplastic disease, other specified site, initial encounter for fracture: Secondary | ICD-10-CM | POA: Diagnosis not present

## 2017-11-02 DIAGNOSIS — C7951 Secondary malignant neoplasm of bone: Secondary | ICD-10-CM | POA: Insufficient documentation

## 2017-11-02 DIAGNOSIS — F17210 Nicotine dependence, cigarettes, uncomplicated: Secondary | ICD-10-CM | POA: Diagnosis not present

## 2017-11-02 DIAGNOSIS — C50419 Malignant neoplasm of upper-outer quadrant of unspecified female breast: Secondary | ICD-10-CM | POA: Diagnosis not present

## 2017-11-02 DIAGNOSIS — E039 Hypothyroidism, unspecified: Secondary | ICD-10-CM | POA: Diagnosis not present

## 2017-11-02 HISTORY — PX: IR KYPHO THORACIC WITH BONE BIOPSY: IMG5518

## 2017-11-02 HISTORY — PX: IR BONE TUMOR(S)RF ABLATION: IMG2284

## 2017-11-02 LAB — CBC
HCT: 32.1 % — ABNORMAL LOW (ref 36.0–46.0)
Hemoglobin: 10.5 g/dL — ABNORMAL LOW (ref 12.0–15.0)
MCH: 27.9 pg (ref 26.0–34.0)
MCHC: 32.7 g/dL (ref 30.0–36.0)
MCV: 85.4 fL (ref 78.0–100.0)
PLATELETS: 216 10*3/uL (ref 150–400)
RBC: 3.76 MIL/uL — ABNORMAL LOW (ref 3.87–5.11)
RDW: 18.4 % — AB (ref 11.5–15.5)
WBC: 30.3 10*3/uL — ABNORMAL HIGH (ref 4.0–10.5)

## 2017-11-02 LAB — BASIC METABOLIC PANEL
ANION GAP: 9 (ref 5–15)
BUN: 7 mg/dL (ref 6–20)
CALCIUM: 9.1 mg/dL (ref 8.9–10.3)
CO2: 27 mmol/L (ref 22–32)
CREATININE: 0.8 mg/dL (ref 0.44–1.00)
Chloride: 93 mmol/L — ABNORMAL LOW (ref 101–111)
GFR calc Af Amer: >60 mL/min (ref >60)
GLUCOSE: 92 mg/dL (ref 65–99)
Potassium: 3.3 mmol/L — ABNORMAL LOW (ref 3.5–5.1)
Sodium: 129 mmol/L — ABNORMAL LOW (ref 135–145)

## 2017-11-02 LAB — APTT: APTT: 25 s (ref 24–36)

## 2017-11-02 LAB — PROTIME-INR
INR: 0.99
PROTHROMBIN TIME: 13 s (ref 11.4–15.2)

## 2017-11-02 MED ORDER — CEFAZOLIN SODIUM-DEXTROSE 2-4 GM/100ML-% IV SOLN
INTRAVENOUS | Status: AC
  Filled 2017-11-02: qty 100

## 2017-11-02 MED ORDER — FENTANYL CITRATE (PF) 100 MCG/2ML IJ SOLN
INTRAMUSCULAR | Status: AC | PRN
  Administered 2017-11-02 (×2): 50 ug via INTRAVENOUS
  Administered 2017-11-02: 25 ug via INTRAVENOUS

## 2017-11-02 MED ORDER — MIDAZOLAM HCL 2 MG/2ML IJ SOLN
INTRAMUSCULAR | Status: AC | PRN
  Administered 2017-11-02 (×4): 1 mg via INTRAVENOUS

## 2017-11-02 MED ORDER — HEPARIN SOD (PORK) LOCK FLUSH 100 UNIT/ML IV SOLN
500.0000 [IU] | INTRAVENOUS | Status: AC | PRN
  Administered 2017-11-02: 500 [IU]

## 2017-11-02 MED ORDER — IOPAMIDOL (ISOVUE-300) INJECTION 61%
INTRAVENOUS | Status: AC
Start: 2017-11-02 — End: 2017-11-02
  Administered 2017-11-02: 20 mL
  Filled 2017-11-02: qty 50

## 2017-11-02 MED ORDER — MIDAZOLAM HCL 2 MG/2ML IJ SOLN
INTRAMUSCULAR | Status: AC
  Filled 2017-11-02: qty 4

## 2017-11-02 MED ORDER — HEPARIN SOD (PORK) LOCK FLUSH 100 UNIT/ML IV SOLN: 250.0000 [IU] | INTRAVENOUS | Status: DC | PRN

## 2017-11-02 MED ORDER — FENTANYL CITRATE (PF) 100 MCG/2ML IJ SOLN
INTRAMUSCULAR | Status: AC
  Filled 2017-11-02: qty 4

## 2017-11-02 MED ORDER — SODIUM CHLORIDE 0.9% FLUSH: 10.0000 mL | INTRAVENOUS | Status: DC | PRN

## 2017-11-02 MED ORDER — SODIUM CHLORIDE 0.9 % IV SOLN: INTRAVENOUS | Status: DC

## 2017-11-02 MED ORDER — LIDOCAINE HCL (PF) 1 % IJ SOLN
INTRAMUSCULAR | Status: AC
  Filled 2017-11-02: qty 30

## 2017-11-02 MED ORDER — LIDOCAINE HCL (PF) 1 % IJ SOLN
INTRAMUSCULAR | Status: AC | PRN
  Administered 2017-11-02: 20 mL

## 2017-11-02 MED ORDER — CEFAZOLIN SODIUM-DEXTROSE 2-4 GM/100ML-% IV SOLN
2.0000 g | INTRAVENOUS | Status: AC
  Administered 2017-11-02: 2 g via INTRAVENOUS

## 2017-11-02 NOTE — H&P (Signed)
Chief Complaint: Patient was seen in consultation today for T8 biopsy/osteo Cool ablation and kyphoplasty at the request of Dr Guy Begin  Referring Physician(s): Dr Guy Begin  Supervising Physician: Jacqulynn Cadet  Patient Status: [facility] - Out-pt  History of Present Illness: Tracie [name] is a 66 y.o. female   Dr Laurence Ferrari note 10/07/17: Tracie [name] is a 66 y.o. female with a recent diagnosis of locally advanced and stage IV breast cancer of the upper outer quadrant. Pathologic diagnosis invasive ductal carcinoma, receptor positive, HER-2 negative, Ki-67 90%, grade 3. She had positive axillary lymph nodes and a 2.1 cm primary mass.  Staging imaging demonstrates a lytic lesion in the T8 vertebral body.  She is currently undergoing neoadjuvant chemotherapy/immunotherapy with carboplatin, docetaxel, trastuzumab and pertuzamab which was initiated on 10/01/2017.  Lesion appears to extend through the vertebral body cortex and she does have some right scapular pain which is likely referred from T8.  She is a good candidate for transpedicular biopsy and OsteoCool (RFA + KP) of the T8 lesion which could all be performed in the same session.   Pt now scheduled for T8 Biopsy; Osteo Cool Ablation and kyphoplasty    Past Medical History:  Diagnosis Date  . Angina pectoris ([place])   . Asthma   . Cancer ([place])    right breast  . Constipation   . Diverticulitis   . Headache   . HPV in female   . Hyperlipidemia   . Hypertension   . Hypothyroidism   . Wears dentures   . Wears glasses     Past Surgical History:  Procedure Laterality Date  . CESAREAN SECTION    . COLON SURGERY  2012   sigmoidectomy  . COLOSTOMY    . COLOSTOMY TAKEDOWN  10/06/11  . IR RADIOLOGIST EVAL & MGMT  10/07/2017  . TONSILLECTOMY    . TUBAL LIGATION      Allergies: Fish allergy; Peanut-containing drug products; and Ace inhibitors  Medications: Prior to Admission medications   Medication Sig  Start Date End Date Taking? Authorizing Provider  albuterol (VENTOLIN HFA) 108 (90 BASE) MCG/ACT inhaler Inhale 1-2 puffs into the lungs every 6 (six) hours as needed for wheezing or shortness of breath. 10/10/14  Yes Tanda Rockers, MD  amLODipine (NORVASC) 10 MG tablet Take 10 mg by mouth daily.  09/01/17  Yes [provider]  levothyroxine (SYNTHROID, LEVOTHROID) 50 MCG tablet Take 50 mcg by mouth daily. 05/27/16  Yes [provider]  loratadine (CLARITIN) 10 MG tablet Take 1 tablet (10 mg total) by mouth daily. 10/01/17  Yes Magrinat, [name], MD  losartan-hydrochlorothiazide (HYZAAR) 100-12.5 MG tablet Take 1 tablet by mouth daily.  08/17/17  Yes [provider]  metoprolol succinate (TOPROL-XL) 50 MG 24 hr tablet Take 50 mg by mouth daily.  07/02/17  Yes [provider]  mometasone-formoterol (DULERA) 100-5 MCG/ACT AERO Inhale 2 puffs into the lungs 2 (two) times daily. 01/04/16  Yes Tanda Rockers, MD  Alirocumab (PRALUENT) 75 MG/ML SOPN Inject 75 mg into the muscle every 14 (fourteen) days.  08/10/17   [provider]  dexamethasone (DECADRON) 4 MG tablet Take 2 tablets (8 mg total) by mouth 2 (two) times daily. Start the day before Taxotere. Then again the day after chemo for 3 days. 09/09/17   Magrinat, [name], MD  diphenoxylate-atropine (LOMOTIL) 2.5-0.025 MG tablet Take 1 tablet by mouth 4 (four) times daily as needed for diarrhea or loose stools. 10/09/17  Magrinat, [name], MD  HYDROcodone-acetaminophen (NORCO) 5-325 MG tablet Take 1-2 tablets by mouth every 6 (six) hours as needed for moderate pain or severe pain. 09/25/17   [name], MD  lidocaine-prilocaine (EMLA) cream Apply to affected area once 09/09/17   Magrinat, [name], MD  LORazepam (ATIVAN) 0.5 MG tablet Take 1 tablet (0.5 mg total) by mouth at bedtime as needed (Nausea or vomiting). 09/09/17   Magrinat, [name], MD  Multiple Vitamins-Minerals (ICAPS AREDS 2) CAPS Take 1 capsule  by mouth 2 (two) times daily. 10/29/17   Magrinat, [name], MD  nitroGLYCERIN (NITROSTAT) 0.4 MG SL tablet Place 0.4 mg under the tongue every 5 (five) minutes as needed for chest pain.    [provider]  ondansetron (ZOFRAN) 8 MG tablet Take 1 tablet (8 mg total) by mouth every 8 (eight) hours as needed for nausea or vomiting. 10/09/17   Magrinat, [name], MD  potassium chloride (K-DUR) 10 MEQ tablet Take 1 tablet (10 mEq total) by mouth daily. 10/22/17   Gardenia Phlegm, NP  prochlorperazine (COMPAZINE) 10 MG tablet Take 1 tablet (10 mg total) by mouth every 6 (six) hours as needed (Nausea or vomiting). 09/09/17   Magrinat, [name], MD  Skin Protectants, Misc. (EUCERIN) cream Apply topically as needed for dry skin. 10/29/17   Magrinat, [name], MD     Family History  Problem Relation Age of Onset  . Cancer Mother        rectal  . Hypertension Brother   . Hypertension Brother   . Asthma Son   . Asthma Brother     Social History   Socioeconomic History  . Marital status: Divorced    Spouse name: None  . Number of children: None  . Years of education: None  . Highest education level: None  Social Needs  . Financial resource strain: None  . Food insecurity - worry: None  . Food insecurity - inability: None  . Transportation needs - medical: None  . Transportation needs - non-medical: None  Occupational History  . None  Tobacco Use  . Smoking status: Current Every Day Smoker    Packs/day: 0.50    Years: 40.00    Pack years: 20.00    Types: Cigarettes  . Smokeless tobacco: Never Used  . Tobacco comment: Pt already has info  Substance and Sexual Activity  . Alcohol use: Yes    Comment: occasional  . Drug use: No  . Sexual activity: None  Other Topics Concern  . None  Social History Narrative  . None     Review of Systems: A 12 point ROS discussed and pertinent positives are indicated in the HPI above.  All other systems are negative.  Review of  Systems  Constitutional: Positive for activity change. Negative for appetite change, fatigue and fever.  Respiratory: Negative for shortness of breath.   Cardiovascular: Negative for chest pain.  Gastrointestinal: Negative for abdominal pain.  Musculoskeletal: Positive for back pain.  Neurological: Positive for weakness.  Psychiatric/Behavioral: Negative for behavioral problems and confusion.    Vital Signs: BP 136/71   Pulse 67   Temp 98.5 F (36.9 C) (Oral)   Resp 16   Ht 5' 3.5" (1.613 m)   Wt 175 lb (79.4 kg)   SpO2 98%   BMI 30.51 kg/m   Physical Exam  Constitutional: She is oriented to person, place, and time.  Cardiovascular: Normal rate and regular rhythm.  Pulmonary/Chest: Effort normal.  Abdominal:  Soft. Bowel sounds are normal.  Musculoskeletal: Normal range of motion.  Upper back pain  Neurological: She is alert and oriented to person, place, and time.  Skin: Skin is warm and dry.  Psychiatric: She has a normal mood and affect. Her behavior is normal. Judgment and thought content normal.  Nursing note and vitals reviewed.   Imaging: Ct Angio Chest Pe W And/or Wo Contrast  Result Date: 10/13/2017 CLINICAL DATA:  Shortness of breath. Concern for pulmonary embolism. History of breast cancer, currently on chemotherapy. EXAM: CT ANGIOGRAPHY CHEST WITH CONTRAST TECHNIQUE: Multidetector CT imaging of the chest was performed using the standard protocol during bolus administration of intravenous contrast. Multiplanar CT image reconstructions and MIPs were obtained to evaluate the vascular anatomy. CONTRAST:  80 cc Isovue 370 intravenous contrast. COMPARISON:  CT chest, abdomen, and pelvis dated September 24, 2017. FINDINGS: Cardiovascular: Satisfactory opacification of the pulmonary arteries to the segmental level. No evidence of pulmonary embolism. Heart size is borderline enlarged. No pericardial effusion. Coronary, aortic arch, and branch vessel atherosclerotic vascular  disease. Right chest wall Port-A-Cath with tip at the cavoatrial junction, unchanged Mediastinum/Nodes: Unchanged 9 mm right subpectoral lymph node. No enlarged mediastinal, hilar, or left axillary lymph nodes. Thyroid gland, trachea, and esophagus demonstrate no significant findings. Lungs/Pleura: Mild upper lobe centrilobular emphysema. Bibasilar atelectasis/scarring. No consolidation, pleural effusion, or pneumothorax. Upper Abdomen: No acute abnormality. Unchanged right adrenal adenoma. Musculoskeletal: Unchanged 2.4 cm lytic lesion in the T8 vertebral body. Review of the MIP images confirms the above findings. IMPRESSION: 1. No evidence of pulmonary embolism. No acute intrathoracic process. 2. Unchanged 9 mm round right subpectoral lymph node and 2.4 cm lytic lesion in the T8 vertebral body, suspicious for metastases. 3. Mild emphysema (ICD10-J43.9). 4.  Aortic atherosclerosis (ICD10-I70.0). Electronically Signed   By: Titus Dubin M.D.   On: 10/13/2017 07:48   Ir Radiologist Eval & Mgmt  Result Date: 10/28/2017 Please refer to notes tab for details about interventional procedure. (Op Note)   Labs:  CBC: Recent Labs    10/09/17 0842 10/13/17 0553 10/22/17 0830 10/29/17 1119  WBC 31.7* 16.3* 5.8 10.4*  HGB 12.4 11.5* 10.6* 10.7*  HCT 37.1 34.3* 31.8* 32.5*  PLT 188 173 355 160    COAGS: No results for input(s): INR, APTT in the last 8760 hours.  BMP: Recent Labs    09/23/17 1414  10/09/17 0842 10/13/17 0553 10/22/17 0830 10/29/17 1119  NA 137   < > 131* 132* 138 132*  K 3.2*   < > 3.3* 2.9* 3.7 4.0  CL 102  --   --  90*  --   --   CO2 26   < > _0 GLUCOSE 98   < > 144* 117* 140 99  BUN 9   < > 27.7* 19 18.6 10.8  CALCIUM 9.5   < > 9.0 8.6* 9.8 9.1  CREATININE 0.84   < > 2.0* 1.73* 1.2* 0.8  GFRNONAA >60  --   --  29*  --   --   GFRAA >60  --   --  34*  --   --    < > = values in this interval not displayed.    LIVER FUNCTION TESTS: Recent Labs     10/09/17 0842 10/13/17 0553 10/22/17 0830 10/29/17 1119  BILITOT 0.32 0.2* 0.27 0.41  AST _1 ALT 38 39 29 48  ALKPHOS 118 110 96 105  PROT 6.4 6.3*  6.6 6.3*  ALBUMIN 3.3* 3.5 3.5 3.5    TUMOR MARKERS: No results for input(s): AFPTM, CEA, CA199, CHROMGRNA in the last 8760 hours.  Assessment and Plan:  Hx Breast Ca Thoracic 8 lytic lesion Now scheduled for T8 biopsy; Osteo Cool Ablation and Kyphoplasty Risks and benefits of T8 Bx; ablation and KP were discussed with the patient including, but not limited to education regarding the natural healing process of compression fractures without intervention, bleeding, infection, cement migration which may cause spinal cord damage, paralysis, pulmonary embolism or even death. This interventional procedure involves the use of X-rays and because of the nature of the planned procedure, it is possible that we will have prolonged use of X-ray fluoroscopy. Potential radiation risks to you include (but are not limited to) the following: - A slightly elevated risk for cancer  several years later in life. This risk is typically less than 0.5% percent. This risk is low in comparison to the normal incidence of human cancer, which is 33% for women and 50% for men according to the [employer]. - Radiation induced injury can include skin redness, resembling a rash, tissue breakdown / ulcers and hair loss (which can be temporary or permanent).  The likelihood of either of these occurring depends on the difficulty of the procedure and whether you are sensitive to radiation due to previous procedures, disease, or genetic conditions.  IF your procedure requires a prolonged use of radiation, you will be notified and given written instructions for further action.  It is your responsibility to monitor the irradiated area for the 2 weeks following the procedure and to notify your physician if you are concerned that you have suffered a radiation  induced injury.    All of the patient's questions were answered, patient is agreeable to proceed. Consent signed and in chart.  Thank you for this interesting consult.  I greatly enjoyed meeting Shellye B Weekes and look forward to participating in their care.  A copy of this report was sent to the requesting provider on this date.  Electronically Signed: Lavonia Drafts, PA-C 11/02/2017, 7:52 AM   I spent a total of  30 Minutes   in face to face in clinical consultation, greater than 50% of which was counseling/coordinating care for T8 bx; osteo cool ablation and KP

## 2017-11-02 NOTE — Telephone Encounter (Signed)
Per 11/1 - no los at checkout

## 2017-11-02 NOTE — Progress Notes (Signed)
Patient called nurses station in short stay after discharge looking for her her phone.  Room had been cleaned.  Regino Bellow NT searched room and located phone in dirty linens. Patient notified and coming to pick phone up.

## 2017-11-02 NOTE — Sedation Documentation (Signed)
Patient is resting comfortably. 

## 2017-11-02 NOTE — Sedation Documentation (Signed)
100ccfluid bolus

## 2017-11-02 NOTE — Procedures (Signed)
Interventional Radiology Procedure Note  Procedure:  1.) T8 vertebral biopsy 2.) T8 RFA 3.) T8 KP  Complications: None  Estimated Blood Loss: None  Recommendations: - Path pending - Bedrest x 3 hrs - DC home  Signed,  Criselda Peaches, MD

## 2017-11-02 NOTE — Discharge Instructions (Addendum)
Cryoablation, Care After This sheet gives you information about how to care for yourself after your procedure. Your health care provider may also give you more specific instructions. If you have problems or questions, contact your health care provider. What can I expect after the procedure? After the procedure, it is common to have:  Soreness around the treatment area.  Mild pain and swelling in the treatment area.  Follow these instructions at home: Treatment area care   Follow instructions from your health care provider about how to take care of your incision. Make sure you: ? Wash your hands with soap and water before you change your bandage (dressing). If soap and water are not available, use hand sanitizer. ? Change your dressing as told by your health care provider. ? Leave stitches (sutures) in place. They may need to stay in place for 2 weeks or longer.  Check your treatment area every day for signs of infection. Check for: ? More redness, swelling, or pain. ? More fluid or blood. ? Warmth. ? Pus or a bad smell.  Keep the treated area clean, dry, and covered with a dressing until it has healed. Clean the area with soap and water or as told by your health care provider.  You may shower if your health care provider approves. If your bandage gets wet, change it right away. Activity  Follow instructions from your health care provider about any activity limitations.  Do not drive for 24 hours if you received a medicine to help you relax (sedative). General instructions  Take over-the-counter and prescription medicines only as told by your health care provider.  Keep all follow-up visits as told by your health care provider. This is important. Contact a health care provider if:  You do not have a bowel movement for 2 days.  You have nausea or vomiting.  You have more redness, swelling, or pain around your treatment area.  You have more fluid or blood coming from your  treatment area.  Your treatment area feels warm to the touch.  You have pus or a bad smell coming from your treatment area.  You have a fever. Get help right away if:  You have severe pain.  You have trouble swallowing or breathing.  You have severe weakness or dizziness.  You have chest pain or shortness of breath. This information is not intended to replace advice given to you by your health care provider. Make sure you discuss any questions you have with your health care provider. Document Released: 10/05/2013 Document Revised: 07/04/2016 Document Reviewed: 05/14/2016 Elsevier Interactive Patient Education  2018 [employer]. Moderate Conscious Sedation, Adult, Care After These instructions provide you with information about caring for yourself after your procedure. Your health care provider may also give you more specific instructions. Your treatment has been planned according to current medical practices, but problems sometimes occur. Call your health care provider if you have any problems or questions after your procedure. What can I expect after the procedure? After your procedure, it is common:  To feel sleepy for several hours.  To feel clumsy and have poor balance for several hours.  To have poor judgment for several hours.  To vomit if you eat too soon.  Follow these instructions at home: For at least 24 hours after the procedure:   Do not: ? Participate in activities where you could fall or become injured. ? Drive. ? Use heavy machinery. ? Drink alcohol. ? Take sleeping pills or medicines that cause  drowsiness. ? Make important decisions or sign legal documents. ? Take care of children on your own.  Rest. Eating and drinking  Follow the diet recommended by your health care provider.  If you vomit: ? Drink water, juice, or soup when you can drink without vomiting. ? Make sure you have little or no nausea before eating solid foods. General  instructions  Have a responsible adult stay with you until you are awake and alert.  Take over-the-counter and prescription medicines only as told by your health care provider.  If you smoke, do not smoke without supervision.  Keep all follow-up visits as told by your health care provider. This is important. Contact a health care provider if:  You keep feeling nauseous or you keep vomiting.  You feel light-headed.  You develop a rash.  You have a fever. Get help right away if:  You have trouble breathing. This information is not intended to replace advice given to you by your health care provider. Make sure you discuss any questions you have with your health care provider. Document Released: 10/05/2013 Document Revised: 05/19/2016 Document Reviewed: 04/05/2016 Elsevier Interactive Patient Education  [employer].

## 2017-11-03 ENCOUNTER — Other Ambulatory Visit (HOSPITAL_COMMUNITY): Payer: Self-pay | Admitting: Interventional Radiology

## 2017-11-03 ENCOUNTER — Telehealth: Payer: Self-pay | Admitting: *Deleted

## 2017-11-03 DIAGNOSIS — C7951 Secondary malignant neoplasm of bone: Secondary | ICD-10-CM

## 2017-11-03 NOTE — Telephone Encounter (Signed)
This RN returned call to pt per her VM and contact with [facility] stating she would like to have two teeth pulled due to discomfort. Pt is requesting a letter of clearance from MD so she can proceed with the procedure.  Note pt had cycle 1 of TCHP on 10/22/2017 with Neulasta support. She is scheduled for cycle 2 on 11/12/2017 with appointment with [facility]/NP same day.  Per call to patient obtained identified VM- message left requesting a return call to discuss her concern further for appropriate recommendations.

## 2017-11-05 ENCOUNTER — Encounter: Payer: Self-pay | Admitting: Oncology

## 2017-11-05 NOTE — Progress Notes (Signed)
Called patient to introduce myself as her Arboriculturist and to ask if she has any financial questions or concerns. Patient states she does not at this time. Discussed the [employer] and qualifications. Patient states she is sure she is over the income due to both employment and social security. Gave patient my name and number for any other financial questions or concerns.

## 2017-11-09 ENCOUNTER — Encounter: Payer: Self-pay | Admitting: *Deleted

## 2017-11-12 ENCOUNTER — Ambulatory Visit (HOSPITAL_BASED_OUTPATIENT_CLINIC_OR_DEPARTMENT_OTHER): Payer: 59 | Admitting: Adult Health

## 2017-11-12 ENCOUNTER — Other Ambulatory Visit (HOSPITAL_BASED_OUTPATIENT_CLINIC_OR_DEPARTMENT_OTHER): Payer: 59

## 2017-11-12 ENCOUNTER — Encounter: Payer: Self-pay | Admitting: *Deleted

## 2017-11-12 ENCOUNTER — Ambulatory Visit (HOSPITAL_BASED_OUTPATIENT_CLINIC_OR_DEPARTMENT_OTHER): Payer: 59

## 2017-11-12 ENCOUNTER — Telehealth: Payer: Self-pay | Admitting: Adult Health

## 2017-11-12 ENCOUNTER — Ambulatory Visit: Payer: 59

## 2017-11-12 ENCOUNTER — Encounter: Payer: Self-pay | Admitting: Adult Health

## 2017-11-12 VITALS — BP 120/48 | HR 80 | Temp 98.4°F | Resp 16 | Ht 63.5 in | Wt 173.4 lb

## 2017-11-12 DIAGNOSIS — C7951 Secondary malignant neoplasm of bone: Secondary | ICD-10-CM

## 2017-11-12 DIAGNOSIS — C50411 Malignant neoplasm of upper-outer quadrant of right female breast: Secondary | ICD-10-CM

## 2017-11-12 DIAGNOSIS — K0889 Other specified disorders of teeth and supporting structures: Secondary | ICD-10-CM

## 2017-11-12 DIAGNOSIS — Z5112 Encounter for antineoplastic immunotherapy: Secondary | ICD-10-CM | POA: Diagnosis not present

## 2017-11-12 DIAGNOSIS — Z170 Estrogen receptor positive status [ER+]: Secondary | ICD-10-CM

## 2017-11-12 DIAGNOSIS — C773 Secondary and unspecified malignant neoplasm of axilla and upper limb lymph nodes: Secondary | ICD-10-CM

## 2017-11-12 DIAGNOSIS — Z5111 Encounter for antineoplastic chemotherapy: Secondary | ICD-10-CM | POA: Diagnosis not present

## 2017-11-12 LAB — COMPREHENSIVE METABOLIC PANEL
ALT: 25 U/L (ref 0–55)
AST: 14 U/L (ref 5–34)
Albumin: 3.3 g/dL — ABNORMAL LOW (ref 3.5–5.0)
Alkaline Phosphatase: 87 U/L (ref 40–150)
Anion Gap: 8 mEq/L (ref 3–11)
BUN: 8.1 mg/dL (ref 7.0–26.0)
CALCIUM: 9.4 mg/dL (ref 8.4–10.4)
CHLORIDE: 100 meq/L (ref 98–109)
CO2: 29 meq/L (ref 22–29)
CREATININE: 0.8 mg/dL (ref 0.6–1.1)
EGFR: >60 mL/min/{1.73_m2} (ref >60)
GLUCOSE: 115 mg/dL (ref 70–140)
Potassium: 3.2 mEq/L — ABNORMAL LOW (ref 3.5–5.1)
Sodium: 138 mEq/L (ref 136–145)
Total Bilirubin: 0.36 mg/dL (ref 0.20–1.20)
Total Protein: 6.1 g/dL — ABNORMAL LOW (ref 6.4–8.3)

## 2017-11-12 LAB — CBC WITH DIFFERENTIAL/PLATELET
BASO%: 0.2 % (ref 0.0–2.0)
BASOS ABS: 0 10*3/uL (ref 0.0–0.1)
EOS ABS: 0 10*3/uL (ref 0.0–0.5)
EOS%: 0.6 % (ref 0.0–7.0)
HCT: 30.4 % — ABNORMAL LOW (ref 34.8–46.6)
HGB: 10.1 g/dL — ABNORMAL LOW (ref 11.6–15.9)
LYMPH%: 23.2 % (ref 14.0–49.7)
MCH: 29.6 pg (ref 25.1–34.0)
MCHC: 33.2 g/dL (ref 31.5–36.0)
MCV: 89.1 fL (ref 79.5–101.0)
MONO#: 0.7 10*3/uL (ref 0.1–0.9)
MONO%: 15.5 % — AB (ref 0.0–14.0)
NEUT#: 2.8 10*3/uL (ref 1.5–6.5)
NEUT%: 60.5 % (ref 38.4–76.8)
Platelets: 276 10*3/uL (ref 145–400)
RBC: 3.41 10*6/uL — AB (ref 3.70–5.45)
RDW: 19.6 % — ABNORMAL HIGH (ref 11.2–14.5)
WBC: 4.7 10*3/uL (ref 3.9–10.3)
lymph#: 1.1 10*3/uL (ref 0.9–3.3)

## 2017-11-12 MED ORDER — SODIUM CHLORIDE 0.9 % IV SOLN
Freq: Once | INTRAVENOUS | Status: AC
  Administered 2017-11-12: 11:00:00 via INTRAVENOUS
  Filled 2017-11-12: qty 5

## 2017-11-12 MED ORDER — ACETAMINOPHEN 325 MG PO TABS
650.0000 mg | ORAL_TABLET | Freq: Once | ORAL | Status: AC
  Administered 2017-11-12: 650 mg via ORAL

## 2017-11-12 MED ORDER — SODIUM CHLORIDE 0.9 % IV SOLN
420.0000 mg | Freq: Once | INTRAVENOUS | Status: AC
  Administered 2017-11-12: 420 mg via INTRAVENOUS
  Filled 2017-11-12: qty 14

## 2017-11-12 MED ORDER — AMOXICILLIN-POT CLAVULANATE 875-125 MG PO TABS: 1.0000 | ORAL_TABLET | Freq: Two times a day (BID) | ORAL | 0 refills | Status: DC

## 2017-11-12 MED ORDER — CARBOPLATIN CHEMO INJECTION 600 MG/60ML
490.0000 mg | Freq: Once | INTRAVENOUS | Status: AC
  Administered 2017-11-12: 490 mg via INTRAVENOUS
  Filled 2017-11-12: qty 49

## 2017-11-12 MED ORDER — HEPARIN SOD (PORK) LOCK FLUSH 100 UNIT/ML IV SOLN
500.0000 [IU] | Freq: Once | INTRAVENOUS | Status: AC | PRN
  Administered 2017-11-12: 500 [IU]
  Filled 2017-11-12: qty 5

## 2017-11-12 MED ORDER — DIPHENHYDRAMINE HCL 25 MG PO CAPS
ORAL_CAPSULE | ORAL | Status: AC
  Filled 2017-11-12: qty 1

## 2017-11-12 MED ORDER — SODIUM CHLORIDE 0.9 % IV SOLN
75.0000 mg/m2 | Freq: Once | INTRAVENOUS | Status: AC
  Administered 2017-11-12: 150 mg via INTRAVENOUS
  Filled 2017-11-12: qty 15

## 2017-11-12 MED ORDER — PEGFILGRASTIM 6 MG/0.6ML ~~LOC~~ PSKT
6.0000 mg | PREFILLED_SYRINGE | Freq: Once | SUBCUTANEOUS | Status: AC
  Administered 2017-11-12: 6 mg via SUBCUTANEOUS
  Filled 2017-11-12: qty 0.6

## 2017-11-12 MED ORDER — SODIUM CHLORIDE 0.9% FLUSH
10.0000 mL | Freq: Once | INTRAVENOUS | Status: AC
  Administered 2017-11-12: 10 mL
  Filled 2017-11-12: qty 10

## 2017-11-12 MED ORDER — PALONOSETRON HCL INJECTION 0.25 MG/5ML
0.2500 mg | Freq: Once | INTRAVENOUS | Status: AC
  Administered 2017-11-12: 0.25 mg via INTRAVENOUS

## 2017-11-12 MED ORDER — SODIUM CHLORIDE 0.9% FLUSH
10.0000 mL | INTRAVENOUS | Status: DC | PRN
  Administered 2017-11-12: 10 mL
  Filled 2017-11-12: qty 10

## 2017-11-12 MED ORDER — PALONOSETRON HCL INJECTION 0.25 MG/5ML
INTRAVENOUS | Status: AC
  Filled 2017-11-12: qty 5

## 2017-11-12 MED ORDER — SODIUM CHLORIDE 0.9 % IV SOLN
6.0000 mg/kg | Freq: Once | INTRAVENOUS | Status: AC
  Administered 2017-11-12: 504 mg via INTRAVENOUS
  Filled 2017-11-12: qty 24

## 2017-11-12 MED ORDER — SODIUM CHLORIDE 0.9 % IV SOLN
Freq: Once | INTRAVENOUS | Status: AC
  Administered 2017-11-12: 11:00:00 via INTRAVENOUS

## 2017-11-12 MED ORDER — DIPHENHYDRAMINE HCL 25 MG PO CAPS
25.0000 mg | ORAL_CAPSULE | Freq: Once | ORAL | Status: AC
  Administered 2017-11-12: 25 mg via ORAL

## 2017-11-12 MED ORDER — ACETAMINOPHEN 325 MG PO TABS
ORAL_TABLET | ORAL | Status: AC
  Filled 2017-11-12: qty 2

## 2017-11-12 MED ORDER — MAGIC MOUTHWASH: 5.0000 mL | Freq: Four times a day (QID) | ORAL | 0 refills | Status: DC | PRN

## 2017-11-12 NOTE — Patient Instructions (Signed)
[facility] Discharge Instructions for Patients Receiving Chemotherapy  Today you received the following chemotherapy agents:  Herceptin, Perjeta, Taxotere, and Carboplatin.  To help prevent nausea and vomiting after your treatment, we encourage you to take your nausea medication as directed.   If you develop nausea and vomiting that is not controlled by your nausea medication, call the clinic.   BELOW ARE SYMPTOMS THAT SHOULD BE REPORTED IMMEDIATELY:  *FEVER GREATER THAN 100.5 F  *CHILLS WITH OR WITHOUT FEVER  NAUSEA AND VOMITING THAT IS NOT CONTROLLED WITH YOUR NAUSEA MEDICATION  *UNUSUAL SHORTNESS OF BREATH  *UNUSUAL BRUISING OR BLEEDING  TENDERNESS IN MOUTH AND THROAT WITH OR WITHOUT PRESENCE OF ULCERS  *URINARY PROBLEMS  *BOWEL PROBLEMS  UNUSUAL RASH Items with * indicate a potential emergency and should be followed up as soon as possible.  Feel free to call the clinic should you have any questions or concerns. The clinic phone number is [phone].  Please show the CHEMO ALERT CARD at check-in to the Emergency Department and triage nurse.   

## 2017-11-12 NOTE — Progress Notes (Addendum)
[place]  [phone] Fax:([phone]) [phone]     ID: Tracie [name] DOB: 03-17-1951  MR#: [id number]  NLZ#:[id number]  Patient Care Team: Bartholome Bill, MD as PCP - General (Family Medicine) [name], MD as Consulting Physician (General Surgery) Magrinat, [name], MD as Consulting Physician (Oncology) [name], MD as Attending Physician (Radiation Oncology) OTHER MD:  CHIEF COMPLAINT: Triple positive breast cancer  CURRENT TREATMENT: Neoadjuvant chemotherapy/immunotherapy.   HISTORY OF CURRENT ILLNESS: From the original intake note:  Tracie [name] noted a change in her right breast sometime in July 2018. [facility] to report that she had found a "kernel" in her breast. She was advised to see her primary physician which she did. The patient was then set up for bilateral diagnostic mammography with tomography and right breast ultrasonography at [facility] 09/03/2017. The breast density was category I a. In the upper outer quadrant of the right breast there was a 2.1 cm high density mass, which was palpable. Also in the right breast more posteriorly there was a 1.5 cm high density mass which was felt to be a suspicious lymph node. Ultrasound of the right breast confirmed a 2.1 centimeter lobulated solid mass in the right breast upper outer quadrant 15.8 cm from the nipple in the 10:00 radiant. There was a 1.5 cm oval mass in the right axillary tail with a small rounded masses also suspicious for lymph node involvement. A total of 3 suspicious lymph nodes were identified.  On 09/03/2017 the patient underwent biopsy of the breast mass and the suspicious right axillary lymph node. The final pathology ([id number]) found invasive ductal carcinoma, grade 3, in both. Both tumors were estrogen receptor positive at 70-75%, and both were progesterone receptor negative. Both had an elevated proliferation marker at 90%. The mass in the breast was HER-2  negative, with a signals ratio of 1.25 and the number per cell 1.88. The lymph node mass however was HER-2 positive with a signals ratio of 2.57, and the number per cell 3.60.  The patient's subsequent history is as detailed below.  INTERVAL HISTORY: Tracie [name] returns today for follow-up and treatment of her triple positive breast cancer, which is being treated neo-adjuvantly. Today is day 1 cycle 3 of 6 planned cycles of carboplatin, docetaxel, trastuzumab, and Pertuzumab.   She is doing moderately well today.  She is tolerating chemotherapy well for the most part.    REVIEW OF SYSTEMS: Stevie has some cracking in the tips of her fingers.  She is using aquaphor and cetaphil moisturizer for this daily.  She forgot to apply lotion today and wonders if we have any she can use.  She also notes continued pain in her lower left teeth that need to be extracted.  She says the pain has continued, and she is concerned for infection.  She also notes some soreness under her tongue laterally to the left.   Tishana denies peripheral neuropathy.  She underwent the T8 biopsy, osteocool procedure.  She recovered from that procedure well and went back to work this week.  Her biopsy came back positive for metastatic disease.  She denies any other issues today and a detailed ROS was otherwise non contributory.     PAST MEDICAL HISTORY: Past Medical History:  Diagnosis Date  . Angina pectoris ([place])   . Asthma   . Cancer ([place])    right breast  . Constipation   . Diverticulitis   . Headache   . HPV in  female   . Hyperlipidemia   . Hypertension   . Hypothyroidism   . Wears dentures   . Wears glasses     PAST SURGICAL HISTORY: Past Surgical History:  Procedure Laterality Date  . CESAREAN SECTION    . COLON SURGERY  2012   sigmoidectomy  . COLOSTOMY    . COLOSTOMY TAKEDOWN  10/06/11  . IR BONE TUMOR(S)RF ABLATION  11/02/2017  . IR KYPHO THORACIC WITH BONE BIOPSY  11/02/2017  . IR RADIOLOGIST EVAL & MGMT   10/07/2017  . PORTACATH PLACEMENT N/A 09/25/2017   Procedure: INSERTION PORT-A-CATH WITH ULTRA SOUND ERAS PATHWAY;  Surgeon: [name], MD;  Location: [facility];  Service: General;  Laterality: N/A;  . TONSILLECTOMY    . TUBAL LIGATION      FAMILY HISTORY Family History  Problem Relation Age of Onset  . Cancer Mother        rectal  . Hypertension Brother   . Hypertension Brother   . Asthma Son   . Asthma Brother    The patient has little information regarding her father. Her mother died at age 4 from a strange cancer--the patient does not know what it was, it may well have been cervical cancer from her description. The patient has one brother, no sisters. There is no history of breast or ovarian cancer in the family to the patient's knowledge   GYNECOLOGIC HISTORY:  No LMP recorded. Patient is postmenopausal.  menarche age 17, first live birth age 22 the patient is Strong P3. She stopped having periods at age 47. She never used oral contraceptives or hormone replacement.   SOCIAL HISTORY:  Works as a Scientist, product/process development. She is divorced. Currently her son Tracie [name] lives with her. He is a Art gallery manager. The 2 other children are Tracie [name], who lives in [place] and works in the theater and media, and Tracie [name] lives in [place], and is vice president of a [employer]. The patient has 5 grandchildren. She is a Psychologist, forensic.     ADVANCED DIRECTIVES: Not in place    HEALTH MAINTENANCE: Social History   Tobacco Use  . Smoking status: Current Every Day Smoker    Packs/day: 0.50    Years: 40.00    Pack years: 20.00    Types: Cigarettes  . Smokeless tobacco: Never Used  . Tobacco comment: Pt already has info  Substance Use Topics  . Alcohol use: Yes    Comment: occasional  . Drug use: No     Colonoscopy: September 2012   PAP:  Bone density:09/03/2017    Allergies  Allergen Reactions  . Fish Allergy Anaphylaxis and Swelling    Swelling of hands  and feet.  . Peanut-Containing Drug Products Anaphylaxis    Tree nuts included in allergic reaction.  . Ace Inhibitors Other (See Comments) and Cough    Cold like symptoms     Current Outpatient Medications  Medication Sig Dispense Refill  . albuterol (VENTOLIN HFA) 108 (90 BASE) MCG/ACT inhaler Inhale 1-2 puffs into the lungs every 6 (six) hours as needed for wheezing or shortness of breath. 1 Inhaler 6  . Alirocumab (PRALUENT) 75 MG/ML SOPN Inject 75 mg into the muscle every 14 (fourteen) days.     Marland Kitchen amLODipine (NORVASC) 10 MG tablet Take 10 mg by mouth daily.     Marland Kitchen amoxicillin-clavulanate (AUGMENTIN) 875-125 MG tablet Take 1 tablet 2 (two) times daily by mouth. 14 tablet 0  . dexamethasone (DECADRON) 4  MG tablet Take 2 tablets (8 mg total) by mouth 2 (two) times daily. Start the day before Taxotere. Then again the day after chemo for 3 days. 30 tablet 1  . diphenoxylate-atropine (LOMOTIL) 2.5-0.025 MG tablet Take 1 tablet by mouth 4 (four) times daily as needed for diarrhea or loose stools. 60 tablet 0  . HYDROcodone-acetaminophen (NORCO) 5-325 MG tablet Take 1-2 tablets by mouth every 6 (six) hours as needed for moderate pain or severe pain. 20 tablet 0  . levothyroxine (SYNTHROID, LEVOTHROID) 50 MCG tablet Take 50 mcg by mouth daily.    Marland Kitchen lidocaine-prilocaine (EMLA) cream Apply to affected area once 30 g 3  . loratadine (CLARITIN) 10 MG tablet Take 1 tablet (10 mg total) by mouth daily. 20 tablet 3  . LORazepam (ATIVAN) 0.5 MG tablet Take 1 tablet (0.5 mg total) by mouth at bedtime as needed (Nausea or vomiting). 30 tablet 0  . losartan-hydrochlorothiazide (HYZAAR) 100-12.5 MG tablet Take 1 tablet by mouth daily.   3  . magic mouthwash SOLN Take 5 mLs 4 (four) times daily as needed by mouth for mouth pain. 240 mL 0  . metoprolol succinate (TOPROL-XL) 50 MG 24 hr tablet Take 50 mg by mouth daily.   6  . mometasone-formoterol (DULERA) 100-5 MCG/ACT AERO Inhale 2 puffs into the lungs 2 (two)  times daily. 3 Inhaler 0  . Multiple Vitamins-Minerals (ICAPS AREDS 2) CAPS Take 1 capsule by mouth 2 (two) times daily. 60 capsule 3  . nitroGLYCERIN (NITROSTAT) 0.4 MG SL tablet Place 0.4 mg under the tongue every 5 (five) minutes as needed for chest pain.    Marland Kitchen ondansetron (ZOFRAN) 8 MG tablet Take 1 tablet (8 mg total) by mouth every 8 (eight) hours as needed for nausea or vomiting. 20 tablet 0  . potassium chloride (K-DUR) 10 MEQ tablet Take 1 tablet (10 mEq total) by mouth daily. 30 tablet 0  . prochlorperazine (COMPAZINE) 10 MG tablet Take 1 tablet (10 mg total) by mouth every 6 (six) hours as needed (Nausea or vomiting). 30 tablet 1  . Skin Protectants, Misc. (EUCERIN) cream Apply topically as needed for dry skin. 454 g 0   No current facility-administered medications for this visit.     OBJECTIVE:   Vitals:   11/12/17 0843  BP: (!) 120/48  Pulse: 80  Resp: 16  Temp: 98.4 F (36.9 C)  SpO2: 97%     Body mass index is 30.23 kg/m.   Wt Readings from Last 3 Encounters:  11/12/17 173 lb 6.4 oz (78.7 kg)  11/02/17 175 lb (79.4 kg)  10/29/17 175 lb 6.4 oz (79.6 kg)      ECOG FS:1 - Symptomatic but completely ambulatory GENERAL: Patient is a well appearing female in no acute distress HEENT:  Sclerae anicteric.  Oropharynx clear and moist. Small ulceration underneath left lateral tongue.  No evidence of oropharyngeal candidiasis. Neck is supple.  NODES:  No cervical, supraclavicular, or axillary lymphadenopathy palpated.  BREAST EXAM:  Deferred. LUNGS:  Clear to auscultation bilaterally.  No wheezes or rhonchi. HEART:  Regular rate and rhythm. No murmur appreciated. ABDOMEN:  Soft, nontender.  Positive, normoactive bowel sounds. No organomegaly palpated. MSK:  No focal spinal tenderness to palpation. Full range of motion bilaterally in the upper extremities. EXTREMITIES:  No peripheral edema.   SKIN:  Tips of fingers with mild skin cracking. NEURO:  Nonfocal. Well oriented.   Appropriate affect.      LAB RESULTS:  CMP  Component Value Date/Time   NA 138 11/12/2017 0818   K 3.2 (L) 11/12/2017 0818   CL 93 (L) 11/02/2017 0755   CO2 29 11/12/2017 0818   GLUCOSE 115 11/12/2017 0818   BUN 8.1 11/12/2017 0818   CREATININE 0.8 11/12/2017 0818   CALCIUM 9.4 11/12/2017 0818   PROT 6.1 (L) 11/12/2017 0818   ALBUMIN 3.3 (L) 11/12/2017 0818   AST 14 11/12/2017 0818   ALT 25 11/12/2017 0818   ALKPHOS 87 11/12/2017 0818   BILITOT 0.36 11/12/2017 0818   GFRNONAA >60 11/02/2017 0755   GFRAA >60 11/02/2017 0755    No results found for: TOTALPROTELP, ALBUMINELP, A1GS, A2GS, BETS, BETA2SER, GAMS, MSPIKE, SPEI  No results found for: KPAFRELGTCHN, LAMBDASER, [facility]  Lab Results  Component Value Date   WBC 4.7 11/12/2017   NEUTROABS 2.8 11/12/2017   HGB 10.1 (L) 11/12/2017   HCT 30.4 (L) 11/12/2017   MCV 89.1 11/12/2017   PLT 276 11/12/2017      Chemistry      Component Value Date/Time   NA 138 11/12/2017 0818   K 3.2 (L) 11/12/2017 0818   CL 93 (L) 11/02/2017 0755   CO2 29 11/12/2017 0818   BUN 8.1 11/12/2017 0818   CREATININE 0.8 11/12/2017 0818      Component Value Date/Time   CALCIUM 9.4 11/12/2017 0818   ALKPHOS 87 11/12/2017 0818   AST 14 11/12/2017 0818   ALT 25 11/12/2017 0818   BILITOT 0.36 11/12/2017 0818       No results found for: LABCA2  No components found for: [id number]    Appointment on 11/12/2017  Component Date Value Ref Range Status  . Sodium 11/12/2017 138  136 - 145 mEq/L Final  . Potassium 11/12/2017 3.2* 3.5 - 5.1 mEq/L Final  . Chloride 11/12/2017 100  98 - 109 mEq/L Final  . CO2 11/12/2017 29  22 - 29 mEq/L Final  . Glucose 11/12/2017 115  70 - 140 mg/dl Final   Glucose reference range is for nonfasting patients. Fasting glucose reference range is 70- 100.  Marland Kitchen BUN 11/12/2017 8.1  7.0 - 26.0 mg/dL Final  . Creatinine 11/12/2017 0.8  0.6 - 1.1 mg/dL Final  . Total Bilirubin 11/12/2017 0.36  0.20 -  1.20 mg/dL Final  . Alkaline Phosphatase 11/12/2017 87  40 - 150 U/L Final  . AST 11/12/2017 14  5 - 34 U/L Final  . ALT 11/12/2017 25  0 - 55 U/L Final  . Total Protein 11/12/2017 6.1* 6.4 - 8.3 g/dL Final  . Albumin 11/12/2017 3.3* 3.5 - 5.0 g/dL Final  . Calcium 11/12/2017 9.4  8.4 - 10.4 mg/dL Final  . Anion Gap 11/12/2017 8  3 - 11 mEq/L Final  . EGFR 11/12/2017 >60  >60 ml/min/1.73 m2 Final   eGFR is calculated using the CKD-EPI Creatinine Equation (2009)  . WBC 11/12/2017 4.7  3.9 - 10.3 10e3/uL Final  . NEUT# 11/12/2017 2.8  1.5 - 6.5 10e3/uL Final  . HGB 11/12/2017 10.1* 11.6 - 15.9 g/dL Final  . HCT 11/12/2017 30.4* 34.8 - 46.6 % Final  . Platelets 11/12/2017 276  145 - 400 10e3/uL Final  . MCV 11/12/2017 89.1  79.5 - 101.0 fL Final  . MCH 11/12/2017 29.6  25.1 - 34.0 pg Final  . MCHC 11/12/2017 33.2  31.5 - 36.0 g/dL Final  . RBC 11/12/2017 3.41* 3.70 - 5.45 10e6/uL Final  . RDW 11/12/2017 19.6* 11.2 - 14.5 % Final  . lymph# 11/12/2017  1.1  0.9 - 3.3 10e3/uL Final  . MONO# 11/12/2017 0.7  0.1 - 0.9 10e3/uL Final  . Eosinophils Absolute 11/12/2017 0.0  0.0 - 0.5 10e3/uL Final  . Basophils Absolute 11/12/2017 0.0  0.0 - 0.1 10e3/uL Final  . NEUT% 11/12/2017 60.5  38.4 - 76.8 % Final  . LYMPH% 11/12/2017 23.2  14.0 - 49.7 % Final  . MONO% 11/12/2017 15.5* 0.0 - 14.0 % Final  . EOS% 11/12/2017 0.6  0.0 - 7.0 % Final  . BASO% 11/12/2017 0.2  0.0 - 2.0 % Final    (this displays the last labs from the last 3 days)  No results found for: TOTALPROTELP, ALBUMINELP, A1GS, A2GS, BETS, BETA2SER, GAMS, MSPIKE, SPEI (this displays SPEP labs)  No results found for: KPAFRELGTCHN, LAMBDASER, KAPLAMBRATIO (kappa/lambda light chains)  No results found for: HGBA, HGBA2QUANT, HGBFQUANT, HGBSQUAN (Hemoglobinopathy evaluation)   No results found for: LDH  No results found for: IRON, TIBC, IRONPCTSAT (Iron and TIBC)  No results found for: FERRITIN  Urinalysis    Component Value  Date/Time   LABSPEC 1.020 02/10/2011 2058   PHURINE 6.0 02/10/2011 2058   HGBUR LARGE (A) 02/10/2011 2058   BILIRUBINUR MODERATE (A) 02/10/2011 2058   KETONESUR 15 (A) 02/10/2011 2058   PROTEINUR >=300 (A) 02/10/2011 2058   UROBILINOGEN >=8.0 02/10/2011 2058   NITRITE NEGATIVE 02/10/2011 2058   LEUKOCYTESUR  02/10/2011 2058    NEGATIVE Biochemical Testing Only. Please order routine urinalysis from main lab if confirmatory testing is needed.     STUDIES:  Ir Bone Tumor(s)rf Ablation  Result Date: 11/02/2017 CLINICAL DATA:  66 year old female with a history of breast cancer an a lytic lesion with associated pathologic fracture in the T8 vertebral body. She presents for transpedicular bone biopsy, radiofrequency ablation and kyphoplasty. EXAM: FLUOROSCOPIC GUIDED T8 VERTEBRAL BODY BIOPSY RF ABLATION AND KYPHOPLASTY/CEMENT AUGMENTATION. COMPARISON:  CT scan of the chest 10/13/2017; nuclear medicine bone scan 09/24/2017 MEDICATIONS: Ancef 2 g IV; The antibiotic was administered in an appropriate time interval prior to needle puncture of the skin. ANESTHESIA/SEDATION: Versed 4 mg IV; Fentanyl 75 mcg IV Moderate Sedation Time: 40 minutes; The patient was continuously monitored during the procedure by the interventional radiology nurse under my direct supervision. FLUOROSCOPY TIME:  Fluoroscopy Time: 9 minutes 54 seconds (126 mGy). COMPLICATIONS: None immediate. TECHNIQUE: Informed written consent was obtained from the patient after a thorough discussion of the procedural risks, benefits and alternatives. All questions were addressed. Maximal Sterile Barrier Technique was utilized including caps, mask, sterile gowns, sterile gloves, sterile drape, hand hygiene and skin antiseptic. A timeout was performed prior to the initiation of the procedure. The patient was placed prone on the fluoroscopic table. The skin overlying the upper thoracic region was then prepped and draped in the usual sterile fashion.  Maximal barrier sterile technique was utilized including caps, mask, sterile gowns, sterile gloves, sterile drape, hand hygiene and skin antiseptic. Intravenous Fentanyl and Versed were administered as conscious sedation during continuous cardiorespiratory monitoring by the radiology RN. The left pedicle at T8 was then infiltrated with 1% lidocaine followed by the advancement of a Kyphon trocar needle through the left pedicle into the posterior one-third of the vertebral body. A biopsy specimen was then obtained. Subsequently, the osteo drill was advanced to the anterior third of the vertebral body. The osteo drill was retracted. Through the working cannula, a 10 mm OsteoCool RF ablation probe was inserted and positioned under fluoroscopic guidance. In similar fashion, the right T8 pedicle was  infiltrated with 1% lidocaine. Utilizing a extra pedicular approach, a second Kyphon trocar needle was advanced into the posterior third of the vertebral body. A biopsy specimen was then obtained. Subsequently, the osteo drill was coaxially advanced to the anterior right third. The osteo drill was exchanged for a 10 mm OsteoCool RF ablation probe which was positioned under fluoroscopic guidance. With both OsteoCool ablation probes in place, the ablation was performed for 7.5 minutes. Attention was now paid towards the kyphoplasty portion of the procedure. Beginning at the T8 vertebral body level, a Kyphon inflatable bone tamp 15 x 3 was advanced through both working cannulas and positioned with the distal marker approximately 5 mm from the anterior aspect of the cortex. Appropriate positioning was confirmed on the AP projection. At this time, the balloon was expanded using contrast via a Kyphon inflation syringe device via micro tubing. Inflations were continued under direct fluoroscopic guidance. At this time, methylmethacrylate mixture was reconstituted in the Kyphon bone mixing device system. This was then loaded into the  delivery mechanism, attached to Kyphon bone fillers. The balloons were deflated and removed followed by the instillation of methylmethacrylate mixture with excellent filling in the AP and lateral projections. The working cannulae and the bone filler were then retrieved and removed. Multiple spot radiographic images were obtained in various obliquities. Hemostasis was achieved with manual compression. The patient tolerated the procedure well without immediate postprocedural complication. FINDINGS: Completion images demonstrate a technically excellent result with adequate cement filling of the T8 vertebral body on both the AP and lateral projections. No extravasation was noted into the spinal canal. No epidural venous contamination was seen. There is a small amount of extravasation of the cement into the T8-T9 disc space. IMPRESSION: Technically successful T8 vertebral body biopsy, ablation and cement augmentation using balloon kyphoplasty. PLAN: The patient will be seen for clinical follow-up at the interventional radiology clinic in 2-4 weeks. Signed, Criselda Peaches, MD [facility] Rad[facility] Electronically Signed   By: Jacqulynn Cadet M.D.   On: 11/02/2017 10:24   Ir Kypho Thoracic With Bone Biopsy  Result Date: 11/02/2017 CLINICAL DATA:  66 year old female with a history of breast cancer an a lytic lesion with associated pathologic fracture in the T8 vertebral body. She presents for transpedicular bone biopsy, radiofrequency ablation and kyphoplasty. EXAM: FLUOROSCOPIC GUIDED T8 VERTEBRAL BODY BIOPSY RF ABLATION AND KYPHOPLASTY/CEMENT AUGMENTATION. COMPARISON:  CT scan of the chest 10/13/2017; nuclear medicine bone scan 09/24/2017 MEDICATIONS: Ancef 2 g IV; The antibiotic was administered in an appropriate time interval prior to needle puncture of the skin. ANESTHESIA/SEDATION: Versed 4 mg IV; Fentanyl 75 mcg IV Moderate Sedation Time: 40 minutes; The  patient was continuously monitored during the procedure by the interventional radiology nurse under my direct supervision. FLUOROSCOPY TIME:  Fluoroscopy Time: 9 minutes 54 seconds (126 mGy). COMPLICATIONS: None immediate. TECHNIQUE: Informed written consent was obtained from the patient after a thorough discussion of the procedural risks, benefits and alternatives. All questions were addressed. Maximal Sterile Barrier Technique was utilized including caps, mask, sterile gowns, sterile gloves, sterile drape, hand hygiene and skin antiseptic. A timeout was performed prior to the initiation of the procedure. The patient was placed prone on the fluoroscopic table. The skin overlying the upper thoracic region was then prepped and draped in the usual sterile fashion. Maximal barrier sterile technique was utilized including caps, mask, sterile gowns, sterile gloves, sterile drape, hand hygiene and skin antiseptic. Intravenous Fentanyl and Versed were administered as conscious  sedation during continuous cardiorespiratory monitoring by the radiology RN. The left pedicle at T8 was then infiltrated with 1% lidocaine followed by the advancement of a Kyphon trocar needle through the left pedicle into the posterior one-third of the vertebral body. A biopsy specimen was then obtained. Subsequently, the osteo drill was advanced to the anterior third of the vertebral body. The osteo drill was retracted. Through the working cannula, a 10 mm OsteoCool RF ablation probe was inserted and positioned under fluoroscopic guidance. In similar fashion, the right T8 pedicle was infiltrated with 1% lidocaine. Utilizing a extra pedicular approach, a second Kyphon trocar needle was advanced into the posterior third of the vertebral body. A biopsy specimen was then obtained. Subsequently, the osteo drill was coaxially advanced to the anterior right third. The osteo drill was exchanged for a 10 mm OsteoCool RF ablation probe which was positioned  under fluoroscopic guidance. With both OsteoCool ablation probes in place, the ablation was performed for 7.5 minutes. Attention was now paid towards the kyphoplasty portion of the procedure. Beginning at the T8 vertebral body level, a Kyphon inflatable bone tamp 15 x 3 was advanced through both working cannulas and positioned with the distal marker approximately 5 mm from the anterior aspect of the cortex. Appropriate positioning was confirmed on the AP projection. At this time, the balloon was expanded using contrast via a Kyphon inflation syringe device via micro tubing. Inflations were continued under direct fluoroscopic guidance. At this time, methylmethacrylate mixture was reconstituted in the Kyphon bone mixing device system. This was then loaded into the delivery mechanism, attached to Kyphon bone fillers. The balloons were deflated and removed followed by the instillation of methylmethacrylate mixture with excellent filling in the AP and lateral projections. The working cannulae and the bone filler were then retrieved and removed. Multiple spot radiographic images were obtained in various obliquities. Hemostasis was achieved with manual compression. The patient tolerated the procedure well without immediate postprocedural complication. FINDINGS: Completion images demonstrate a technically excellent result with adequate cement filling of the T8 vertebral body on both the AP and lateral projections. No extravasation was noted into the spinal canal. No epidural venous contamination was seen. There is a small amount of extravasation of the cement into the T8-T9 disc space. IMPRESSION: Technically successful T8 vertebral body biopsy, ablation and cement augmentation using balloon kyphoplasty. PLAN: The patient will be seen for clinical follow-up at the interventional radiology clinic in 2-4 weeks. Signed, Criselda Peaches, MD [facility] Radiology  Electronically Signed   By: Jacqulynn Cadet M.D.   On: 11/02/2017 10:24     ELIGIBLE FOR AVAILABLE RESEARCH PROTOCOL: no  ASSESSMENT: 66 y.o. [place] woman status post right breast upper outer quadrant and right axillary lymph node biopsy 09/03/2017, both positive for invasive ductal carcinoma, grade 3, estrogen receptor positive, progesterone receptor negative, with an MIB-1 of 90%, the lymph node being HER-2 positive, the breast mass HER-2 negative  (a) staging studies September 24, 2017 show a lytic lesion in T8, possible areas of concern at L1-L2  (b) osteochondral/biopsy procedure of T8 lesion scheduled for November 02, 2017  (1) neoadjuvant chemotherapy with carboplatin, docetaxel, trastuzumab, and pertuzumab started 10/01/2017  (2) trastuzumab to continue to complete 12 months  (3) definitive surgery to follow chemotherapy  (4) adjuvant radiation to follow  (5) anti-estrogens to start at the completion of local treatment  PLAN: Allisyn is doing well today.  I reviewed her CBC with her today which is stable.  She can proceed with chemotherapy.  I sent in augmentin for her teeth issues.  I also prescribed magic mouthwash for mucositis for her to use four times a day as needed.  We reviewed her skin care regimen, including keeping lotion with her and moisturizing frequently.  I also suggested eucerin and flexitol to help with her skin and the cracking.  She also met with Dr. Jana Hakim today who reviewed her T8 biopsy and prognosis with her in detail.  We will also get a PET scan, and I ordered this today as well.    Mckaylie has several appointments coming up in the next couple of weeks.  She will see me on 11/23 for labs and evaluation.  She will also f/u with IR regarding her T8 biopsy and osteocool procedure. Finally, she will see the cardio oncologists on 11/27/2017 for repeat echo and evaluation.  I have previously discussed Dyna with Dr. Aundra Dubin due to her low normal LVEF on her  initial echocardiogram.    Markiya knows to call for any questions or concerns prior to her next appointment with [place].    Wilber Bihari, NP  11/12/17 10:14 AM Medical Oncology and Hematology [facility] [address] [place], [place] [address] Tel. [phone]    Fax. [phone]   ADDENDUM: I spoke with Tracie [name] today and went over with her the results of her recent bone biopsy.  This does show stage IV, metastatic disease.  She understands that we do not know how to cure stage IV breast cancer.  However in many cases this becomes a chronic condition, like high blood pressure or diabetes.  We certainly hope that we will be the case in her.  Specifically we are going to complete her chemotherapy, then proceed to breast surgery.  When she receives the adjuvant radiation for her breast she likely also will receive radiation to her biopsied bone lesion which represents oligo metastatic disease.  Because she is stage IV however we are going to continue the Herceptin indefinitely.  After 1 year we will switch from every 3 weeks to every 4 weeks.  Of course she also will be on antiestrogens long-term  She was able to understand this and accepted as a more positive than negative evaluation, which I think is correct.  I personally saw this patient and performed a substantive portion of this encounter with the listed APP documented above.   Chaun[place]y Cruel, MD Medical Oncology and Hematology [facility] [address] [place], [place] [address] Tel. [phone]    Fax. [phone]

## 2017-11-12 NOTE — Telephone Encounter (Signed)
Gave patient avs for appts per 11/15 los.

## 2017-11-13 ENCOUNTER — Encounter: Payer: Self-pay | Admitting: *Deleted

## 2017-11-13 ENCOUNTER — Other Ambulatory Visit: Payer: Self-pay | Admitting: *Deleted

## 2017-11-13 DIAGNOSIS — E876 Hypokalemia: Secondary | ICD-10-CM

## 2017-11-13 MED ORDER — POTASSIUM CHLORIDE ER 10 MEQ PO TBCR: 20.0000 meq | EXTENDED_RELEASE_TABLET | Freq: Every day | ORAL | 0 refills | Status: DC

## 2017-11-14 ENCOUNTER — Other Ambulatory Visit: Payer: Self-pay | Admitting: Oncology

## 2017-11-20 ENCOUNTER — Other Ambulatory Visit (HOSPITAL_BASED_OUTPATIENT_CLINIC_OR_DEPARTMENT_OTHER): Payer: 59

## 2017-11-20 ENCOUNTER — Encounter: Payer: Self-pay | Admitting: Adult Health

## 2017-11-20 ENCOUNTER — Ambulatory Visit (HOSPITAL_BASED_OUTPATIENT_CLINIC_OR_DEPARTMENT_OTHER): Payer: 59 | Admitting: Adult Health

## 2017-11-20 VITALS — BP 108/54 | HR 98 | Temp 98.3°F | Resp 20 | Ht 63.5 in | Wt 168.3 lb

## 2017-11-20 DIAGNOSIS — C773 Secondary and unspecified malignant neoplasm of axilla and upper limb lymph nodes: Secondary | ICD-10-CM | POA: Diagnosis not present

## 2017-11-20 DIAGNOSIS — C50411 Malignant neoplasm of upper-outer quadrant of right female breast: Secondary | ICD-10-CM

## 2017-11-20 DIAGNOSIS — Z170 Estrogen receptor positive status [ER+]: Secondary | ICD-10-CM

## 2017-11-20 DIAGNOSIS — R238 Other skin changes: Secondary | ICD-10-CM | POA: Diagnosis not present

## 2017-11-20 DIAGNOSIS — R197 Diarrhea, unspecified: Secondary | ICD-10-CM

## 2017-11-20 DIAGNOSIS — C7951 Secondary malignant neoplasm of bone: Secondary | ICD-10-CM | POA: Diagnosis not present

## 2017-11-20 LAB — CBC WITH DIFFERENTIAL/PLATELET
BASO%: 0.3 % (ref 0.0–2.0)
BASOS ABS: 0.1 10*3/uL (ref 0.0–0.1)
EOS%: 0.1 % (ref 0.0–7.0)
Eosinophils Absolute: 0 10*3/uL (ref 0.0–0.5)
HEMATOCRIT: 31.5 % — AB (ref 34.8–46.6)
HEMOGLOBIN: 10.5 g/dL — AB (ref 11.6–15.9)
LYMPH#: 2.3 10*3/uL (ref 0.9–3.3)
LYMPH%: 12.1 % — ABNORMAL LOW (ref 14.0–49.7)
MCH: 29.7 pg (ref 25.1–34.0)
MCHC: 33.3 g/dL (ref 31.5–36.0)
MCV: 89.2 fL (ref 79.5–101.0)
MONO#: 2.5 10*3/uL — AB (ref 0.1–0.9)
MONO%: 13.1 % (ref 0.0–14.0)
NEUT#: 14.3 10*3/uL — ABNORMAL HIGH (ref 1.5–6.5)
NEUT%: 74.4 % (ref 38.4–76.8)
Platelets: 289 10*3/uL (ref 145–400)
RBC: 3.53 10*6/uL — ABNORMAL LOW (ref 3.70–5.45)
RDW: 19.8 % — AB (ref 11.2–14.5)
WBC: 19.3 10*3/uL — ABNORMAL HIGH (ref 3.9–10.3)

## 2017-11-20 LAB — COMPREHENSIVE METABOLIC PANEL
ALK PHOS: 113 U/L (ref 40–150)
ALT: 42 U/L (ref 0–55)
ANION GAP: 10 meq/L (ref 3–11)
AST: 17 U/L (ref 5–34)
Albumin: 3.3 g/dL — ABNORMAL LOW (ref 3.5–5.0)
BILIRUBIN TOTAL: 0.27 mg/dL (ref 0.20–1.20)
BUN: 13.3 mg/dL (ref 7.0–26.0)
CO2: 25 meq/L (ref 22–29)
Calcium: 9.5 mg/dL (ref 8.4–10.4)
Chloride: 98 mEq/L (ref 98–109)
Creatinine: 1.2 mg/dL — ABNORMAL HIGH (ref 0.6–1.1)
EGFR: 55 mL/min/{1.73_m2} — AB (ref >60)
Glucose: 130 mg/dl (ref 70–140)
POTASSIUM: 3.9 meq/L (ref 3.5–5.1)
Sodium: 133 mEq/L — ABNORMAL LOW (ref 136–145)
TOTAL PROTEIN: 6.2 g/dL — AB (ref 6.4–8.3)

## 2017-11-20 NOTE — Pre-Procedure Instructions (Signed)
  [id number]

## 2017-11-20 NOTE — Progress Notes (Signed)
[name]  [phone] [phone]     ID: Tracie [name] DOB: August 24, 1951  MR#: [id number]  QXI#:[id number]  Patient Care Team: Bartholome Bill, MD as PCP - General (Family Medicine) [name], MD as Consulting Physician (General Surgery) Magrinat, [name], MD as Consulting Physician (Oncology) [name], MD as Attending Physician (Radiation Oncology) OTHER MD:  CHIEF COMPLAINT: Triple positive breast cancer  CURRENT TREATMENT: Neoadjuvant chemotherapy/immunotherapy.   HISTORY OF CURRENT ILLNESS: From the original intake note:  Tracie [name] noted a change in her right breast sometime in July 2018. [facility] to report that she had found a "kernel" in her breast. She was advised to see her primary physician which she did. The patient was then set up for bilateral diagnostic mammography with tomography and right breast ultrasonography at [facility] 09/03/2017. The breast density was category I a. In the upper outer quadrant of the right breast there was a 2.1 cm high density mass, which was palpable. Also in the right breast more posteriorly there was a 1.5 cm high density mass which was felt to be a suspicious lymph node. Ultrasound of the right breast confirmed a 2.1 centimeter lobulated solid mass in the right breast upper outer quadrant 15.8 cm from the nipple in the 10:00 radiant. There was a 1.5 cm oval mass in the right axillary tail with a small rounded masses also suspicious for lymph node involvement. A total of 3 suspicious lymph nodes were identified.  On 09/03/2017 the patient underwent biopsy of the breast mass and the suspicious right axillary lymph node. The final pathology ([id number]) found invasive ductal carcinoma, grade 3, in both. Both tumors were estrogen receptor positive at 70-75%, and both were progesterone receptor negative. Both had an elevated proliferation marker at 90%. The mass in the breast was HER-2  negative, with a signals ratio of 1.25 and the number per cell 1.88. The lymph node mass however was HER-2 positive with a signals ratio of 2.57, and the number per cell 3.60.  The patient's subsequent history is as detailed below.  INTERVAL HISTORY: [name] returns today for follow-up and treatment of her triple positive breast cancer, which is being treated neo-adjuvantly. Today is day 8 cycle 3 of 6 planned cycles of carboplatin, docetaxel, trastuzumab, and Pertuzumab.     REVIEW OF SYSTEMS: Tracie [name] is doing moderately well today.  Her hands are bothering her.  There is mild cracking in the tips of her fingers and her hands and feet are erythematous.  She is applying cream to her hands and feet, but it is bothersome when she is washing dishes.  She also has had some diarrhea, but denies any dehydration.  She said the diarrhea has occurred over the past three days, however is resolved today.  She tells me that she is drinking fluids, and does not feel weak, or dehydrated today.  A detailed ROS is otherwise non contributory.     PAST MEDICAL HISTORY: Past Medical History:  Diagnosis Date  . Angina pectoris ([facility]ac[name])   . Asthma   . Cancer ([facility]acie [name])    right breast  . Constipation   . Diverticulitis   . Headache   . HPV in female   . Hyperlipidemia   . Hypertension   . Hypothyroidism   . Wears dentures   . Wears glasses     PAST SURGICAL HISTORY: Past Surgical History:  Procedure Laterality Date  . CESAREAN SECTION    . COLON SURGERY  2012   sigmoidectomy  . COLOSTOMY    . COLOSTOMY TAKEDOWN  10/06/11  . IR BONE TUMOR(S)RF ABLATION  11/02/2017  . IR KYPHO THORACIC WITH BONE BIOPSY  11/02/2017  . IR RADIOLOGIST EVAL & MGMT  10/07/2017  . PORTACATH PLACEMENT N/A 09/25/2017   Procedure: INSERTION PORT-A-CATH WITH ULTRA SOUND ERAS PATHWAY;  Surgeon: [name], MD;  Location: [name];  Service: General;  Laterality: N/A;  . TONSILLECTOMY    . TUBAL LIGATION      FAMILY HISTORY Family  History  Problem Relation Age of Onset  . Cancer Mother        rectal  . Hypertension Brother   . Hypertension Brother   . Asthma Son   . Asthma Brother    The patient has little information regarding her father. Her mother died at age 39 from a strange cancer--the patient does not know what it was, it may well have been cervical cancer from her description. The patient has one brother, no sisters. There is no history of breast or ovarian cancer in the family to the patient's knowledge   GYNECOLOGIC HISTORY:  No LMP recorded. Patient is postmenopausal.  menarche age 11, first live birth age 45 the patient is [name]. She stopped having periods at age 33. She never used oral contraceptives or hormone replacement.   SOCIAL HISTORY:  Works as a Scientist, product/process development. She is divorced. Currently her son Tracie [name] lives with her. He is a Art gallery manager. The 2 other children are Tracie [name], who lives in [place] and works in the theater and media, and Tracie [name] lives in [name], and is vice president of a [employer]. The patient has 5 grandchildren. She is a Psychologist, forensic.     ADVANCED DIRECTIVES: Not in place    HEALTH MAINTENANCE: Social History   Tobacco Use  . Smoking status: Current Every Day Smoker    Packs/day: 0.50    Years: 40.00    Pack years: 20.00    Types: Cigarettes  . Smokeless tobacco: Never Used  . Tobacco comment: Pt already has info  Substance Use Topics  . Alcohol use: Yes    Comment: occasional  . Drug use: No     Colonoscopy: September 2012   PAP:  Bone density:09/03/2017    Allergies  Allergen Reactions  . Fish Allergy Anaphylaxis and Swelling    Swelling of hands and feet.  . Peanut-Containing Drug Products Anaphylaxis    Tree nuts included in allergic reaction.  . Ace Inhibitors Other (See Comments) and Cough    Cold like symptoms     Current Outpatient Medications  Medication Sig Dispense Refill  . albuterol (VENTOLIN  HFA) 108 (90 BASE) MCG/ACT inhaler Inhale 1-2 puffs into the lungs every 6 (six) hours as needed for wheezing or shortness of breath. 1 Inhaler 6  . Alirocumab (PRALUENT) 75 MG/ML SOPN Inject 75 mg into the muscle every 14 (fourteen) days.     Marland Kitchen amLODipine (NORVASC) 10 MG tablet Take 10 mg by mouth daily.     Marland Kitchen amoxicillin-clavulanate (AUGMENTIN) 875-125 MG tablet Take 1 tablet 2 (two) times daily by mouth. 14 tablet 0  . dexamethasone (DECADRON) 4 MG tablet Take 2 tablets (8 mg total) by mouth 2 (two) times daily. Start the day before Taxotere. Then again the day after chemo for 3 days. 30 tablet 1  . diphenoxylate-atropine (LOMOTIL) 2.5-0.025 MG tablet Take 1 tablet by mouth 4 (four) times daily as  needed for diarrhea or loose stools. 60 tablet 0  . levothyroxine (SYNTHROID, LEVOTHROID) 50 MCG tablet Take 50 mcg by mouth daily.    Marland Kitchen lidocaine-prilocaine (EMLA) cream Apply to affected area once 30 g 3  . loratadine (CLARITIN) 10 MG tablet Take 1 tablet (10 mg total) by mouth daily. 20 tablet 3  . LORazepam (ATIVAN) 0.5 MG tablet Take 1 tablet (0.5 mg total) by mouth at bedtime as needed (Nausea or vomiting). 30 tablet 0  . losartan-hydrochlorothiazide (HYZAAR) 100-12.5 MG tablet Take 1 tablet by mouth daily.   3  . magic mouthwash SOLN Take 5 mLs 4 (four) times daily as needed by mouth for mouth pain. 240 mL 0  . magic mouthwash SOLN Take 5 mLs by mouth 4 (four) times daily as needed.    . metoprolol succinate (TOPROL-XL) 50 MG 24 hr tablet Take 50 mg by mouth daily.   6  . mometasone-formoterol (DULERA) 100-5 MCG/ACT AERO Inhale 2 puffs into the lungs 2 (two) times daily. 3 Inhaler 0  . Multiple Vitamins-Minerals (ICAPS AREDS 2) CAPS Take 1 capsule by mouth 2 (two) times daily. 60 capsule 3  . nitroGLYCERIN (NITROSTAT) 0.4 MG SL tablet Place 0.4 mg under the tongue every 5 (five) minutes as needed for chest pain.    Marland Kitchen ondansetron (ZOFRAN) 8 MG tablet Take 1 tablet (8 mg total) by mouth every 8  (eight) hours as needed for nausea or vomiting. 20 tablet 0  . potassium chloride (K-DUR) 10 MEQ tablet Take 2 tablets (20 mEq total) daily by mouth. 60 tablet 0  . prochlorperazine (COMPAZINE) 10 MG tablet Take 1 tablet (10 mg total) by mouth every 6 (six) hours as needed (Nausea or vomiting). 30 tablet 1  . Skin Protectants, Misc. (EUCERIN) cream Apply topically as needed for dry skin. 454 g 0   No current facility-administered medications for this visit.     OBJECTIVE:   Vitals:   11/20/17 1326  BP: (!) 108/54  Pulse: 98  Resp: 20  Temp: 98.3 F (36.8 C)  SpO2: 96%     Body mass index is 29.35 kg/m.   Wt Readings from Last 3 Encounters:  11/20/17 168 lb 4.8 oz (76.3 kg)  11/12/17 173 lb 6.4 oz (78.7 kg)  11/02/17 175 lb (79.4 kg)      ECOG FS:1 - Symptomatic but completely ambulatory GENERAL: Patient is a well appearing female in no acute distress HEENT:  Sclerae anicteric.  Oropharynx clear and moist.   No evidence of oropharyngeal candidiasis. Neck is supple.  NODES:  No cervical, supraclavicular, or axillary lymphadenopathy palpated.  BREAST EXAM:  Deferred. LUNGS:  Clear to auscultation bilaterally.  No wheezes or rhonchi. HEART:  Regular rate and rhythm. No murmur appreciated. ABDOMEN:  Soft, nontender.  Positive, normoactive bowel sounds. No organomegaly palpated. MSK:  No focal spinal tenderness to palpation. Full range of motion bilaterally in the upper extremities. EXTREMITIES:  No peripheral edema.   SKIN:  Tips of fingers with mild skin cracking. Erythema on hands and feet see below NEURO:  Nonfocal. Well oriented.  Appropriate affect. Pictures below are of the skin on 11/20/2017      LAB RESULTS:  CMP     Component Value Date/Time   NA 133 (L) 11/20/2017 1301   K 3.9 11/20/2017 1301   CL 93 (L) 11/02/2017 0755   CO2 25 11/20/2017 1301   GLUCOSE 130 11/20/2017 1301   BUN 13.3 11/20/2017 1301   CREATININE 1.2 (H) 11/20/2017  1301   CALCIUM 9.5  11/20/2017 1301   PROT 6.2 (L) 11/20/2017 1301   ALBUMIN 3.3 (L) 11/20/2017 1301   AST 17 11/20/2017 1301   ALT 42 11/20/2017 1301   ALKPHOS 113 11/20/2017 1301   BILITOT 0.27 11/20/2017 1301   GFRNONAA >60 11/02/2017 0755   GFRAA >60 11/02/2017 0755    No results found for: TOTALPROTELP, ALBUMINELP, A1GS, A2GS, BETS, BETA2SER, GAMS, MSPIKE, SPEI  No results found for: KPAFRELGTCHN, LAMBDASER, KAPLAMBRATIO  Lab Results  Component Value Date   WBC 19.3 (H) 11/20/2017   NEUTROABS 14.3 (H) 11/20/2017   HGB 10.5 (L) 11/20/2017   HCT 31.5 (L) 11/20/2017   MCV 89.2 11/20/2017   PLT 289 11/20/2017      Chemistry      Component Value Date/Time   NA 133 (L) 11/20/2017 1301   K 3.9 11/20/2017 1301   CL 93 (L) 11/02/2017 0755   CO2 25 11/20/2017 1301   BUN 13.3 11/20/2017 1301   CREATININE 1.2 (H) 11/20/2017 1301      Component Value Date/Time   CALCIUM 9.5 11/20/2017 1301   ALKPHOS 113 11/20/2017 1301   AST 17 11/20/2017 1301   ALT 42 11/20/2017 1301   BILITOT 0.27 11/20/2017 1301       No results found for: LABCA2  No components found for: [id number]    Appointment on 11/20/2017  Component Date Value Ref Range Status  . Sodium 11/20/2017 133* 136 - 145 mEq/L Final  . Potassium 11/20/2017 3.9  3.5 - 5.1 mEq/L Final  . Chloride 11/20/2017 98  98 - 109 mEq/L Final  . CO2 11/20/2017 25  22 - 29 mEq/L Final  . Glucose 11/20/2017 130  70 - 140 mg/dl Final   Glucose reference range is for nonfasting patients. Fasting glucose reference range is 70- 100.  Marland Kitchen BUN 11/20/2017 13.3  7.0 - 26.0 mg/dL Final  . Creatinine 11/20/2017 1.2* 0.6 - 1.1 mg/dL Final  . Total Bilirubin 11/20/2017 0.27  0.20 - 1.20 mg/dL Final  . Alkaline Phosphatase 11/20/2017 113  40 - 150 U/L Final  . AST 11/20/2017 17  5 - 34 U/L Final  . ALT 11/20/2017 42  0 - 55 U/L Final  . Total Protein 11/20/2017 6.2* 6.4 - 8.3 g/dL Final  . Albumin 11/20/2017 3.3* 3.5 - 5.0 g/dL Final  . Calcium 11/20/2017  9.5  8.4 - 10.4 mg/dL Final  . Anion Gap 11/20/2017 10  3 - 11 mEq/L Final  . EGFR 11/20/2017 55* >60 ml/min/1.73 m2 Final   eGFR is calculated using the CKD-EPI Creatinine Equation (2009)  . WBC 11/20/2017 19.3* 3.9 - 10.3 10e3/uL Final  . NEUT# 11/20/2017 14.3* 1.5 - 6.5 10e3/uL Final  . HGB 11/20/2017 10.5* 11.6 - 15.9 g/dL Final  . HCT 11/20/2017 31.5* 34.8 - 46.6 % Final  . Platelets 11/20/2017 289  145 - 400 10e3/uL Final  . MCV 11/20/2017 89.2  79.5 - 101.0 fL Final  . MCH 11/20/2017 29.7  25.1 - 34.0 pg Final  . MCHC 11/20/2017 33.3  31.5 - 36.0 g/dL Final  . RBC 11/20/2017 3.53* 3.70 - 5.45 10e6/uL Final  . RDW 11/20/2017 19.8* 11.2 - 14.5 % Final  . lymph# 11/20/2017 2.3  0.9 - 3.3 10e3/uL Final  . MONO# 11/20/2017 2.5* 0.1 - 0.9 10e3/uL Final  . Eosinophils Absolute 11/20/2017 0.0  0.0 - 0.5 10e3/uL Final  . Basophils Absolute 11/20/2017 0.1  0.0 - 0.1 10e3/uL Final  . NEUT% 11/20/2017 74.4  38.4 - 76.8 % Final  . LYMPH% 11/20/2017 12.1* 14.0 - 49.7 % Final  . MONO% 11/20/2017 13.1  0.0 - 14.0 % Final  . EOS% 11/20/2017 0.1  0.0 - 7.0 % Final  . BASO% 11/20/2017 0.3  0.0 - 2.0 % Final    (this displays the last labs from the last 3 days)  No results found for: TOTALPROTELP, ALBUMINELP, A1GS, A2GS, BETS, BETA2SER, GAMS, MSPIKE, SPEI (this displays SPEP labs)  No results found for: KPAFRELGTCHN, LAMBDASER, KAPLAMBRATIO (kappa/lambda light chains)  No results found for: HGBA, HGBA2QUANT, HGBFQUANT, HGBSQUAN (Hemoglobinopathy evaluation)   No results found for: LDH  No results found for: IRON, TIBC, IRONPCTSAT (Iron and TIBC)  No results found for: FERRITIN  Urinalysis    Component Value Date/Time   LABSPEC 1.020 02/10/2011 2058   PHURINE 6.0 02/10/2011 2058   HGBUR LARGE (A) 02/10/2011 2058   BILIRUBINUR MODERATE (A) 02/10/2011 2058   KETONESUR 15 (A) 02/10/2011 2058   PROTEINUR >=300 (A) 02/10/2011 2058   UROBILINOGEN >=8.0 02/10/2011 2058   NITRITE  NEGATIVE 02/10/2011 2058   LEUKOCYTESUR  02/10/2011 2058    NEGATIVE Biochemical Testing Only. Please order routine urinalysis from main lab if confirmatory testing is needed.     STUDIES:  Ir Bone Tumor(s)rf Ablation  Result Date: 11/02/2017 CLINICAL DATA:  66 year old female with a history of breast cancer an a lytic lesion with associated pathologic fracture in the T8 vertebral body. She presents for transpedicular bone biopsy, radiofrequency ablation and kyphoplasty. EXAM: FLUOROSCOPIC GUIDED T8 VERTEBRAL BODY BIOPSY RF ABLATION AND KYPHOPLASTY/CEMENT AUGMENTATION. COMPARISON:  CT scan of the chest 10/13/2017; nuclear medicine bone scan 09/24/2017 MEDICATIONS: Ancef 2 g IV; The antibiotic was administered in an appropriate time interval prior to needle puncture of the skin. ANESTHESIA/SEDATION: Versed 4 mg IV; Fentanyl 75 mcg IV Moderate Sedation Time: 40 minutes; The patient was continuously monitored during the procedure by the interventional radiology nurse under my direct supervision. FLUOROSCOPY TIME:  Fluoroscopy Time: 9 minutes 54 seconds (126 mGy). COMPLICATIONS: None immediate. TECHNIQUE: Informed written consent was obtained from the patient after a thorough discussion of the procedural risks, benefits and alternatives. All questions were addressed. Maximal Sterile Barrier Technique was utilized including caps, mask, sterile gowns, sterile gloves, sterile drape, hand hygiene and skin antiseptic. A timeout was performed prior to the initiation of the procedure. The patient was placed prone on the fluoroscopic table. The skin overlying the upper thoracic region was then prepped and draped in the usual sterile fashion. Maximal barrier sterile technique was utilized including caps, mask, sterile gowns, sterile gloves, sterile drape, hand hygiene and skin antiseptic. Intravenous Fentanyl and Versed were administered as conscious sedation during continuous cardiorespiratory monitoring by the  radiology RN. The left pedicle at T8 was then infiltrated with 1% lidocaine followed by the advancement of a Kyphon trocar needle through the left pedicle into the posterior one-third of the vertebral body. A biopsy specimen was then obtained. Subsequently, the osteo drill was advanced to the anterior third of the vertebral body. The osteo drill was retracted. Through the working cannula, a 10 mm OsteoCool RF ablation probe was inserted and positioned under fluoroscopic guidance. In similar fashion, the right T8 pedicle was infiltrated with 1% lidocaine. Utilizing a extra pedicular approach, a second Kyphon trocar needle was advanced into the posterior third of the vertebral body. A biopsy specimen was then obtained. Subsequently, the osteo drill was coaxially advanced to the anterior right third. The osteo drill was exchanged for  a 10 mm OsteoCool RF ablation probe which was positioned under fluoroscopic guidance. With both OsteoCool ablation probes in place, the ablation was performed for 7.5 minutes. Attention was now paid towards the kyphoplasty portion of the procedure. Beginning at the T8 vertebral body level, a Kyphon inflatable bone tamp 15 x 3 was advanced through both working cannulas and positioned with the distal marker approximately 5 mm from the anterior aspect of the cortex. Appropriate positioning was confirmed on the AP projection. At this time, the balloon was expanded using contrast via a Kyphon inflation syringe device via micro tubing. Inflations were continued under direct fluoroscopic guidance. At this time, methylmethacrylate mixture was reconstituted in the Kyphon bone mixing device system. This was then loaded into the delivery mechanism, attached to Kyphon bone fillers. The balloons were deflated and removed followed by the instillation of methylmethacrylate mixture with excellent filling in the AP and lateral projections. The working cannulae and the bone filler were then retrieved and  removed. Multiple spot radiographic images were obtained in various obliquities. Hemostasis was achieved with manual compression. The patient tolerated the procedure well without immediate postprocedural complication. FINDINGS: Completion images demonstrate a technically excellent result with adequate cement filling of the T8 vertebral body on both the AP and lateral projections. No extravasation was noted into the spinal canal. No epidural venous contamination was seen. There is a small amount of extravasation of the cement into the T8-T9 disc space. IMPRESSION: Technically successful T8 vertebral body biopsy, ablation and cement augmentation using balloon kyphoplasty. PLAN: The patient will be seen for clinical follow-up at the interventional radiology clinic in 2-4 weeks. Signed, Criselda Peaches, MD [facility] Radiology Electronically Signed   By: Jacqulynn Cadet M.D.   On: 11/02/2017 10:24   Ir Kypho Thoracic With Bone Biopsy  Result Date: 11/02/2017 CLINICAL DATA:  66 year old female with a history of breast cancer an a lytic lesion with associated pathologic fracture in the T8 vertebral body. She presents for transpedicular bone biopsy, radiofrequency ablation and kyphoplasty. EXAM: FLUOROSCOPIC GUIDED T8 VERTEBRAL BODY BIOPSY RF ABLATION AND KYPHOPLASTY/CEMENT AUGMENTATION. COMPARISON:  CT scan of the chest 10/13/2017; nuclear medicine bone scan 09/24/2017 MEDICATIONS: Ancef 2 g IV; The antibiotic was administered in an appropriate time interval prior to needle puncture of the skin. ANESTHESIA/SEDATION: Versed 4 mg IV; Fentanyl 75 mcg IV Moderate Sedation Time: 40 minutes; The patient was continuously monitored during the procedure by the interventional radiology nurse under my direct supervision. FLUOROSCOPY TIME:  Fluoroscopy Time: 9 minutes 54 seconds (126 mGy). COMPLICATIONS: None immediate. TECHNIQUE: Informed written consent was obtained from  the patient after a thorough discussion of the procedural risks, benefits and alternatives. All questions were addressed. Maximal Sterile Barrier Technique was utilized including caps, mask, sterile gowns, sterile gloves, sterile drape, hand hygiene and skin antiseptic. A timeout was performed prior to the initiation of the procedure. The patient was placed prone on the fluoroscopic table. The skin overlying the upper thoracic region was then prepped and draped in the usual sterile fashion. Maximal barrier sterile technique was utilized including caps, mask, sterile gowns, sterile gloves, sterile drape, hand hygiene and skin antiseptic. Intravenous Fentanyl and Versed were administered as conscious sedation during continuous cardiorespiratory monitoring by the radiology RN. The left pedicle at T8 was then infiltrated with 1% lidocaine followed by the advancement of a Kyphon trocar needle through the left pedicle into the posterior one-third of the vertebral body. A biopsy specimen was then obtained. Subsequently,  the osteo drill was advanced to the anterior third of the vertebral body. The osteo drill was retracted. Through the working cannula, a 10 mm OsteoCool RF ablation probe was inserted and positioned under fluoroscopic guidance. In similar fashion, the right T8 pedicle was infiltrated with 1% lidocaine. Utilizing a extra pedicular approach, a second Kyphon trocar needle was advanced into the posterior third of the vertebral body. A biopsy specimen was then obtained. Subsequently, the osteo drill was coaxially advanced to the anterior right third. The osteo drill was exchanged for a 10 mm OsteoCool RF ablation probe which was positioned under fluoroscopic guidance. With both OsteoCool ablation probes in place, the ablation was performed for 7.5 minutes. Attention was now paid towards the kyphoplasty portion of the procedure. Beginning at the T8 vertebral body level, a Kyphon inflatable bone tamp 15 x 3 was  advanced through both working cannulas and positioned with the distal marker approximately 5 mm from the anterior aspect of the cortex. Appropriate positioning was confirmed on the AP projection. At this time, the balloon was expanded using contrast via a Kyphon inflation syringe device via micro tubing. Inflations were continued under direct fluoroscopic guidance. At this time, methylmethacrylate mixture was reconstituted in the Kyphon bone mixing device system. This was then loaded into the delivery mechanism, attached to Kyphon bone fillers. The balloons were deflated and removed followed by the instillation of methylmethacrylate mixture with excellent filling in the AP and lateral projections. The working cannulae and the bone filler were then retrieved and removed. Multiple spot radiographic images were obtained in various obliquities. Hemostasis was achieved with manual compression. The patient tolerated the procedure well without immediate postprocedural complication. FINDINGS: Completion images demonstrate a technically excellent result with adequate cement filling of the T8 vertebral body on both the AP and lateral projections. No extravasation was noted into the spinal canal. No epidural venous contamination was seen. There is a small amount of extravasation of the cement into the T8-T9 disc space. IMPRESSION: Technically successful T8 vertebral body biopsy, ablation and cement augmentation using balloon kyphoplasty. PLAN: The patient will be seen for clinical follow-up at the interventional radiology clinic in 2-4 weeks. Signed, Criselda Peaches, MD [facility] Radiology Electronically Signed   By: Jacqulynn Cadet M.D.   On: 11/02/2017 10:24     ELIGIBLE FOR AVAILABLE RESEARCH PROTOCOL: no  ASSESSMENT: 66 y.o. [place] woman status post right breast upper outer quadrant and right axillary lymph node biopsy 09/03/2017, both positive for invasive  ductal carcinoma, grade 3, estrogen receptor positive, progesterone receptor negative, with an MIB-1 of 90%, the lymph node being HER-2 positive, the breast mass HER-2 negative  (a) staging studies September 24, 2017 show a lytic lesion in T8, possible areas of concern at L1-L2  (b) osteochondral/biopsy procedure of T8 lesion scheduled for November 02, 2017  (1) neoadjuvant chemotherapy with carboplatin, docetaxel, trastuzumab, and pertuzumab started 10/01/2017  (2) trastuzumab to continue to complete 12 months  (3) definitive surgery to follow chemotherapy  (4) adjuvant radiation to follow  (5) anti-estrogens to start at the completion of local treatment  PLAN:  [facility] [name] is doing well today.  Her CBC is stable.  CMET pending at completion of appointment.  I recommended when doing dishes, to wear gloves.  She verbalized understanding of that.  I offered her IV fluids today, due to the ? Dehydration from her diarrhea.  She declined, and told me she really felt like she was getting enough fluids.  We reviewed her upcoming appointments.  She is aware of all of them, with the exception of the PET scan being at [facility].  She does not want the PET scan at [facility].  She wants it at [facility]. I gave her the central radiology phone number so she can call and reschedule it.  There also is a question about her teeth and the procedure for the extractions.  She said the dentist needed more information from [place] about xrays.  I encouraged her to call our office for any clarification.  Shambhavi will return to see [place] for her next cycle of chemotherapy on 12/03/2017.  Tracie [name] knows to call for any questions or concerns prior to her next appointment with [place].    A total of (30) minutes of face-to-face time was spent with this patient with greater than 50% of that time in counseling and care-coordination.   Wilber Bihari, NP  11/20/17 3:14 PM Medical Oncology and Hematology [facility] [place], [place] [address] Tel. [phone]    Fax. [phone]

## 2017-11-23 ENCOUNTER — Telehealth: Payer: Self-pay

## 2017-11-23 NOTE — Telephone Encounter (Signed)
Call placed to check in on patient.  She says that her diarrhea stopped yesterday but she is irritated because there is still a metallic taste in her mouth and her hands and feet are still bothering her.  Patient was getting ready for work.  LPN encouraged patient to call with further issues.  Patient voiced understanding, no questions or concerns at this time.

## 2017-11-23 NOTE — Telephone Encounter (Signed)
-----   Message from Gardenia Phlegm, NP sent at 11/23/2017  8:37 AM EST ----- Please call and check on patient.  Her labs indicate she is dehydrated.  If she has had any further diarrhea, she will need to come in for IV fluids.   ----- Message ----- From: Interface, Lab In Three Zero One Sent: 11/20/2017   1:16 PM To: Chauncey Cruel, MD

## 2017-11-25 ENCOUNTER — Ambulatory Visit
Admission: RE | Admit: 2017-11-25 | Discharge: 2017-11-25 | Disposition: A | Discharge status: Home / Self Care | Payer: 59 | Source: Ambulatory Visit | Attending: Interventional Radiology | Admitting: Interventional Radiology

## 2017-11-25 DIAGNOSIS — C7951 Secondary malignant neoplasm of bone: Secondary | ICD-10-CM

## 2017-11-25 HISTORY — PX: IR RADIOLOGIST EVAL & MGMT: IMG5224

## 2017-11-25 NOTE — Progress Notes (Signed)
Referring Physician(s): McCullough,Heath  Chief Complaint: The patient is seen in follow up today s/p kyphoplasty of T8 with radiofrequency ablation  History of present illness: Tracie [name] is a 66 year old female with a history of breast cancer an a lytic lesion with associated pathologic fracture in the T8 vertebral body associated with back and left scapular pain. She underwent bone biopsy, radiofrequency ablation and kyphoplasty of T8 with Dr. Laurence Ferrari on 11/02/17.  She presents to radiology clinic today for follow-up of her procedure. She has been doing well with almost complete reduction of her back pain and elimination of her scapular pain.  She states her puncture sites have healed well.  She denies fever, chills, back pain, weakness, numbness or tingling.   Past Medical History:  Diagnosis Date  . Angina pectoris ([place])   . Asthma   . Cancer ([place])    right breast  . Constipation   . Diverticulitis   . Headache   . HPV in female   . Hyperlipidemia   . Hypertension   . Hypothyroidism   . Wears dentures   . Wears glasses     Past Surgical History:  Procedure Laterality Date  . CESAREAN SECTION    . COLON SURGERY  2012   sigmoidectomy  . COLOSTOMY    . COLOSTOMY TAKEDOWN  10/06/11  . IR BONE TUMOR(S)RF ABLATION  11/02/2017  . IR KYPHO THORACIC WITH BONE BIOPSY  11/02/2017  . IR RADIOLOGIST EVAL & MGMT  10/07/2017  . PORTACATH PLACEMENT N/A 09/25/2017   Procedure: INSERTION PORT-A-CATH WITH ULTRA SOUND ERAS PATHWAY;  Surgeon: [name], MD;  Location: [facility];  Service: General;  Laterality: N/A;  . TONSILLECTOMY    . TUBAL LIGATION      Allergies: Fish allergy; Peanut-containing drug products; and Ace inhibitors  Medications: Prior to Admission medications   Medication Sig Start Date End Date Taking? Authorizing Provider  albuterol (VENTOLIN HFA) 108 (90 BASE) MCG/ACT inhaler Inhale 1-2 puffs into the lungs every 6 (six) hours as needed for wheezing or  shortness of breath. 10/10/14   Tanda Rockers, MD  Alirocumab (PRALUENT) 75 MG/ML SOPN Inject 75 mg into the muscle every 14 (fourteen) days.  08/10/17   [provider]  amLODipine (NORVASC) 10 MG tablet Take 10 mg by mouth daily.  09/01/17   [provider]  amoxicillin-clavulanate (AUGMENTIN) 875-125 MG tablet Take 1 tablet 2 (two) times daily by mouth. 11/12/17   Causey, [name], NP  dexamethasone (DECADRON) 4 MG tablet Take 2 tablets (8 mg total) by mouth 2 (two) times daily. Start the day before Taxotere. Then again the day after chemo for 3 days. 09/09/17   Magrinat, [name], MD  diphenoxylate-atropine (LOMOTIL) 2.5-0.025 MG tablet Take 1 tablet by mouth 4 (four) times daily as needed for diarrhea or loose stools. 10/09/17   Magrinat, [name], MD  levothyroxine (SYNTHROID, LEVOTHROID) 50 MCG tablet Take 50 mcg by mouth daily. 05/27/16   [provider]  lidocaine-prilocaine (EMLA) cream Apply to affected area once 09/09/17   Magrinat, [name], MD  loratadine (CLARITIN) 10 MG tablet Take 1 tablet (10 mg total) by mouth daily. 10/01/17   Magrinat, [name], MD  LORazepam (ATIVAN) 0.5 MG tablet Take 1 tablet (0.5 mg total) by mouth at bedtime as needed (Nausea or vomiting). 09/09/17   Magrinat, [name], MD  losartan-hydrochlorothiazide (HYZAAR) 100-12.5 MG tablet Take 1 tablet by mouth daily.  08/17/17   [provider]  magic mouthwash  SOLN Take 5 mLs 4 (four) times daily as needed by mouth for mouth pain. 11/12/17   Gardenia Phlegm, NP  magic mouthwash SOLN Take 5 mLs by mouth 4 (four) times daily as needed. 11/12/17   [provider]  metoprolol succinate (TOPROL-XL) 50 MG 24 hr tablet Take 50 mg by mouth daily.  07/02/17   [provider]  mometasone-formoterol (DULERA) 100-5 MCG/ACT AERO Inhale 2 puffs into the lungs 2 (two) times daily. 01/04/16   Tanda Rockers, MD  Multiple Vitamins-Minerals (ICAPS AREDS 2) CAPS Take 1  capsule by mouth 2 (two) times daily. 10/29/17   Magrinat, [name], MD  nitroGLYCERIN (NITROSTAT) 0.4 MG SL tablet Place 0.4 mg under the tongue every 5 (five) minutes as needed for chest pain.    [provider]  ondansetron (ZOFRAN) 8 MG tablet Take 1 tablet (8 mg total) by mouth every 8 (eight) hours as needed for nausea or vomiting. 10/09/17   Magrinat, [name], MD  potassium chloride (K-DUR) 10 MEQ tablet Take 2 tablets (20 mEq total) daily by mouth. 11/13/17   Causey, [name], NP  prochlorperazine (COMPAZINE) 10 MG tablet Take 1 tablet (10 mg total) by mouth every 6 (six) hours as needed (Nausea or vomiting). 09/09/17   Magrinat, [name], MD  Skin Protectants, Misc. (EUCERIN) cream Apply topically as needed for dry skin. 10/29/17   Magrinat, [name], MD     Family History  Problem Relation Age of Onset  . Cancer Mother        rectal  . Hypertension Brother   . Hypertension Brother   . Asthma Son   . Asthma Brother     Social History   Socioeconomic History  . Marital status: Divorced    Spouse name: Not on file  . Number of children: Not on file  . Years of education: Not on file  . Highest education level: Not on file  Social Needs  . Financial resource strain: Not on file  . Food insecurity - worry: Not on file  . Food insecurity - inability: Not on file  . Transportation needs - medical: Not on file  . Transportation needs - non-medical: Not on file  Occupational History  . Not on file  Tobacco Use  . Smoking status: Current Every Day Smoker    Packs/day: 0.50    Years: 40.00    Pack years: 20.00    Types: Cigarettes  . Smokeless tobacco: Never Used  . Tobacco comment: Pt already has info  Substance and Sexual Activity  . Alcohol use: Yes    Comment: occasional  . Drug use: No  . Sexual activity: Not on file  Other Topics Concern  . Not on file  Social History Narrative  . Not on file   Vital Signs: BP 105/60   Pulse 81   Temp 98.3 F  (36.8 C) (Oral)   Resp 14   Ht 5' 3.5" (1.613 m)   Wt 168 lb (76.2 kg)   SpO2 100%   BMI 29.29 kg/m   Physical Exam  NAD, alert MSK:  No point tenderness along the bony prominences of the spine.  Skin:  Puncture sites intact and well healed without irritation or erythema.   Imaging: No results found.  Labs:  CBC: Recent Labs    10/29/17 1119 11/02/17 0755 11/12/17 0818 11/20/17 1302  WBC 10.4* 30.3* 4.7 19.3*  HGB 10.7* 10.5* 10.1* 10.5*  HCT 32.5* 32.1* 30.4* 31.5*  PLT 160 216 276 289    COAGS: Recent Labs    11/02/17 0755  INR 0.99  APTT 25    BMP: Recent Labs    09/23/17 1414  10/13/17 0553  10/29/17 1119 11/02/17 0755 11/12/17 0818 11/20/17 1301  NA 137   < > 132*   < > 132* 129* 138 133*  K 3.2*   < > 2.9*   < > 4.0 3.3* 3.2* 3.9  CL 102  --  90*  --   --  93*  --   --   CO2 26   < > 29   < > 28 27 29 25   GLUCOSE 98   < > 117*   < > 99 92 115 130  BUN 9   < > 19   < > 10.8 7 8.1 13.3  CALCIUM 9.5   < > 8.6*   < > 9.1 9.1 9.4 9.5  CREATININE 0.84   < > 1.73*   < > 0.8 0.80 0.8 1.2*  GFRNONAA >60  --  29*  --   --  >60  --   --   GFRAA >60  --  34*  --   --  >60  --   --    < > = values in this interval not displayed.    LIVER FUNCTION TESTS: Recent Labs    10/22/17 0830 10/29/17 1119 11/12/17 0818 11/20/17 1301  BILITOT 0.27 0.41 0.36 0.27  AST 18 25 14 17   ALT 29 48 25 42  ALKPHOS 96 105 87 113  PROT 6.6 6.3* 6.1* 6.2*  ALBUMIN 3.5 3.5 3.3* 3.3*    Assessment: S/p bone biopsy, kyphoplasty of T8 with radiofrequency ablation for painful, metastatic lesion Patient has been doing well since her procedure with significant improvement in her back pain and complete resolution of her scapular pain.  Her puncture sites have healed without issue.  She has plans for ongoing treatment of her metastatic disease.  She does not need scheduled follow-up with IR clinic at this time.  She is instructed to call with questions, concerns, or new  symptoms.   Signed: Docia Barrier, PA 11/25/2017, 3:05 PM   Please refer to Dr. Laurence Ferrari attestation of this note for management and plan.

## 2017-11-26 ENCOUNTER — Encounter: Payer: Self-pay | Admitting: *Deleted

## 2017-11-27 ENCOUNTER — Encounter (HOSPITAL_COMMUNITY): Payer: Self-pay | Admitting: Cardiology

## 2017-11-27 ENCOUNTER — Ambulatory Visit (HOSPITAL_COMMUNITY)
Admission: RE | Admit: 2017-11-27 | Discharge: 2017-11-27 | Disposition: A | Discharge status: Home / Self Care | Payer: 59 | Source: Ambulatory Visit | Attending: Oncology | Admitting: Oncology

## 2017-11-27 ENCOUNTER — Ambulatory Visit (HOSPITAL_BASED_OUTPATIENT_CLINIC_OR_DEPARTMENT_OTHER)
Admission: RE | Admit: 2017-11-27 | Discharge: 2017-11-27 | Disposition: A | Discharge status: Home / Self Care | Payer: 59 | Source: Ambulatory Visit | Attending: Cardiology | Admitting: Cardiology

## 2017-11-27 VITALS — BP 108/60 | HR 86 | Wt 168.5 lb

## 2017-11-27 DIAGNOSIS — I10 Essential (primary) hypertension: Secondary | ICD-10-CM | POA: Insufficient documentation

## 2017-11-27 DIAGNOSIS — I080 Rheumatic disorders of both mitral and aortic valves: Secondary | ICD-10-CM | POA: Insufficient documentation

## 2017-11-27 DIAGNOSIS — C50411 Malignant neoplasm of upper-outer quadrant of right female breast: Secondary | ICD-10-CM

## 2017-11-27 DIAGNOSIS — Z170 Estrogen receptor positive status [ER+]: Secondary | ICD-10-CM | POA: Insufficient documentation

## 2017-11-27 NOTE — Progress Notes (Signed)
  Echocardiogram 2D Echocardiogram has been performed.  Roseanna Rainbow R 11/27/2017, 11:08 AM

## 2017-11-27 NOTE — Patient Instructions (Addendum)
Please ask the [facility] about a possible prescription for Tramadol for your pain.  Follow up with Echocardiogram in 3 months

## 2017-11-29 NOTE — Progress Notes (Signed)
Oncology: Dr. Jana Hakim  66 yo with history of HTN, asthma, and hyperlidemia was referred by Dr. Jana Hakim for cardio-oncology evaluation.  She was diagnosed with breast cancer on the right in 9/18. ER+/PR-.  Lymph node was HER2+, breast biopsy was HER2-.  She is getting neoadjuvant chemotherapy with carboplatin, docetaxel, trastuzumab, pertuzumab x 6 cycles then trastuzumab to complete a year. She will have surgery and radiation after chemo.   She is doing well symptomatically.  No chest pain.  No significant exertional dyspnea, orthopnea, or PND.  She had a stress test around 3 years ago that she thinks was normal.  She still smokes several cigarettes/wk but has almost quit.  No heart issues in her family that she knows about.   PMH: 1. Asthma: mild. 2. HTN 3. Hyperlipidemia 4. Hypothyroidism 5. Diverticulitis with h/o colectomy.  6. Breast cancer: On right, diagnosed 9/18.  ER+/PR-.  Lymph node was HER2+, breast biopsy was HER2-.  She is getting neoadjuvant chemotherapy with carboplatin, docetaxel, trastuzumab, pertuzumab x 6 cycles then trastuzumab to complete a year. She will have surgery and radiation after chemo.  - Echo (9/18): EF 50-55%, restrictive diastolic function, mild AI, mild-moderate MR.  - Echo (12/18): EF 55-60% with mild focal basal septal hypertrophy, GLS -20.5%, normal RV size and systolic function, mild AI, moderate MR.   Social History   Socioeconomic History  . Marital status: Divorced    Spouse name: Not on file  . Number of children: Not on file  . Years of education: Not on file  . Highest education level: Not on file  Social Needs  . Financial resource strain: Not on file  . Food insecurity - worry: Not on file  . Food insecurity - inability: Not on file  . Transportation needs - medical: Not on file  . Transportation needs - non-medical: Not on file  Occupational History  . Not on file  Tobacco Use  . Smoking status: Current Some Day Smoker    Packs/day:  0.50    Years: 40.00    Pack years: 20.00    Types: Cigarettes  . Smokeless tobacco: Never Used  . Tobacco comment: Pt already has info  Substance and Sexual Activity  . Alcohol use: Yes    Comment: occasional  . Drug use: No  . Sexual activity: Not on file  Other Topics Concern  . Not on file  Social History Narrative  . Not on file   Family History  Problem Relation Age of Onset  . Cancer Mother        rectal  . Hypertension Brother   . Hypertension Brother   . Asthma Son   . Asthma Brother    ROS: All systems reviewed and negative except as per HPI.   Current Outpatient Medications  Medication Sig Dispense Refill  . albuterol (VENTOLIN HFA) 108 (90 BASE) MCG/ACT inhaler Inhale 1-2 puffs into the lungs every 6 (six) hours as needed for wheezing or shortness of breath. 1 Inhaler 6  . Alirocumab (PRALUENT) 75 MG/ML SOPN Inject 75 mg into the muscle every 14 (fourteen) days.     Marland Kitchen amLODipine (NORVASC) 10 MG tablet Take 10 mg by mouth daily.     Marland Kitchen dexamethasone (DECADRON) 4 MG tablet Take 2 tablets (8 mg total) by mouth 2 (two) times daily. Start the day before Taxotere. Then again the day after chemo for 3 days. 30 tablet 1  . diphenoxylate-atropine (LOMOTIL) 2.5-0.025 MG tablet Take 1 tablet by mouth 4 (four)  times daily as needed for diarrhea or loose stools. 60 tablet 0  . levothyroxine (SYNTHROID, LEVOTHROID) 50 MCG tablet Take 50 mcg by mouth daily.    Marland Kitchen lidocaine-prilocaine (EMLA) cream Apply to affected area once 30 g 3  . loratadine (CLARITIN) 10 MG tablet Take 1 tablet (10 mg total) by mouth daily. 20 tablet 3  . LORazepam (ATIVAN) 0.5 MG tablet Take 1 tablet (0.5 mg total) by mouth at bedtime as needed (Nausea or vomiting). 30 tablet 0  . losartan-hydrochlorothiazide (HYZAAR) 100-12.5 MG tablet Take 1 tablet by mouth daily.   3  . magic mouthwash SOLN Take 5 mLs 4 (four) times daily as needed by mouth for mouth pain. 240 mL 0  . metoprolol succinate (TOPROL-XL) 50 MG  24 hr tablet Take 50 mg by mouth daily.   6  . mometasone-formoterol (DULERA) 100-5 MCG/ACT AERO Inhale 2 puffs into the lungs 2 (two) times daily. 3 Inhaler 0  . Multiple Vitamins-Minerals (ICAPS AREDS 2) CAPS Take 1 capsule by mouth 2 (two) times daily. 60 capsule 3  . nitroGLYCERIN (NITROSTAT) 0.4 MG SL tablet Place 0.4 mg under the tongue every 5 (five) minutes as needed for chest pain.    Marland Kitchen ondansetron (ZOFRAN) 8 MG tablet Take 1 tablet (8 mg total) by mouth every 8 (eight) hours as needed for nausea or vomiting. 20 tablet 0  . potassium chloride (K-DUR) 10 MEQ tablet Take 2 tablets (20 mEq total) daily by mouth. 60 tablet 0  . prochlorperazine (COMPAZINE) 10 MG tablet Take 1 tablet (10 mg total) by mouth every 6 (six) hours as needed (Nausea or vomiting). 30 tablet 1  . Skin Protectants, Misc. (EUCERIN) cream Apply topically as needed for dry skin. 454 g 0   No current facility-administered medications for this encounter.    BP 108/60   Pulse 86   Wt 168 lb 8 oz (76.4 kg)   SpO2 94%   BMI 29.38 kg/m   General: NAD Neck: No JVD, no thyromegaly or thyroid nodule.  Lungs: Clear to auscultation bilaterally with normal respiratory effort. CV: Nondisplaced PMI.  Heart regular S1/S2, no S3/S4, 1/6 SEM RUSB.  1+ bilateral ankle edema.  No carotid bruit.  Normal pedal pulses.  Abdomen: Soft, nontender, no hepatosplenomegaly, no distention.  Skin: Intact without lesions or rashes.  Neurologic: Alert and oriented x 3.  Psych: Normal affect. Extremities: No clubbing or cyanosis.  HEENT: Normal.   Assessment/Plan: 1. Smoking: I strongly encouraged her to quit completely.  She is close.  2. HTN: BP controlled on current regimen. 3. Breast cancer: She will be on a Herceptin-based regimen for a year.  We discussed the potential cardio-toxicity of Herceptin and the rationale behind echo screening.  I reviewed today's echo: EF and strain pattern look normal.  - Repeat echo in 3 months with office  visit.  Loralie Champagne 11/29/2017

## 2017-11-30 ENCOUNTER — Ambulatory Visit (HOSPITAL_COMMUNITY)
Admission: RE | Admit: 2017-11-30 | Discharge: 2017-11-30 | Disposition: A | Discharge status: Home / Self Care | Payer: 59 | Source: Ambulatory Visit | Attending: Adult Health | Admitting: Adult Health

## 2017-11-30 DIAGNOSIS — Z170 Estrogen receptor positive status [ER+]: Secondary | ICD-10-CM | POA: Diagnosis present

## 2017-11-30 DIAGNOSIS — I517 Cardiomegaly: Secondary | ICD-10-CM | POA: Insufficient documentation

## 2017-11-30 DIAGNOSIS — J320 Chronic maxillary sinusitis: Secondary | ICD-10-CM | POA: Insufficient documentation

## 2017-11-30 DIAGNOSIS — C50411 Malignant neoplasm of upper-outer quadrant of right female breast: Secondary | ICD-10-CM

## 2017-11-30 DIAGNOSIS — I700 Atherosclerosis of aorta: Secondary | ICD-10-CM | POA: Diagnosis not present

## 2017-11-30 DIAGNOSIS — Z01812 Encounter for preprocedural laboratory examination: Secondary | ICD-10-CM | POA: Diagnosis present

## 2017-11-30 DIAGNOSIS — J439 Emphysema, unspecified: Secondary | ICD-10-CM | POA: Insufficient documentation

## 2017-11-30 LAB — GLUCOSE, CAPILLARY: Glucose-Capillary: 86 mg/dL (ref 65–99)

## 2017-11-30 MED ORDER — FLUDEOXYGLUCOSE F - 18 (FDG) INJECTION
8.2000 | Freq: Once | INTRAVENOUS | Status: AC | PRN
  Administered 2017-11-30: 8.2 via INTRAVENOUS

## 2017-12-02 NOTE — Progress Notes (Addendum)
[place]  [phone] [phone]     ID: [id number] LONG DOB: 1951-02-12  MR#: [id number]  GEX#:[id number]  Patient Care Team: Bartholome Bill, MD as PCP - General (Family Medicine) [name], MD as Consulting Physician (General Surgery) Magrinat, [name], MD as Consulting Physician (Oncology) [name], MD as Attending Physician (Radiation Oncology) OTHER MD:  CHIEF COMPLAINT: Triple positive breast cancer  CURRENT TREATMENT: Neoadjuvant chemotherapy/immunotherapy.   HISTORY OF CURRENT ILLNESS: From the original intake note:  Tracie [name] noted a change in her right breast sometime in July 2018. [facility] to report that she had found a "kernel" in her breast. She was advised to see her primary physician which she did. The patient was then set up for bilateral diagnostic mammography with tomography and right breast ultrasonography at [facility] 09/03/2017. The breast density was category I a. In the upper outer quadrant of the right breast there was a 2.1 cm high density mass, which was palpable. Also in the right breast more posteriorly there was a 1.5 cm high density mass which was felt to be a suspicious lymph node. Ultrasound of the right breast confirmed a 2.1 centimeter lobulated solid mass in the right breast upper outer quadrant 15.8 cm from the nipple in the 10:00 radiant. There was a 1.5 cm oval mass in the right axillary tail with a small rounded masses also suspicious for lymph node involvement. A total of 3 suspicious lymph nodes were identified.  On 09/03/2017 the patient underwent biopsy of the breast mass and the suspicious right axillary lymph node. The final pathology ([id number]) found invasive ductal carcinoma, grade 3, in both. Both tumors were estrogen receptor positive at 70-75%, and both were progesterone receptor negative. Both had an elevated proliferation marker at 90%. The mass in the breast was HER-2  negative, with a signals ratio of 1.25 and the number per cell 1.88. The lymph node mass however was HER-2 positive with a signals ratio of 2.57, and the number per cell 3.60.  The patient's subsequent history is as detailed below.  INTERVAL HISTORY: Tracie [name] returns today for follow-up and treatment of her triple positive breast cancer, which is being treated neo-adjuvantly. Today is day 1 cycle 4 of 6 planned cycles of carboplatin, docetaxel, trastuzumab, and Pertuzumab.     REVIEW OF SYSTEMS: Tracie [name] is doing moderately well today.  She is continuing to have peeling of her hands bilaterally.  She says that she feels like she is going to lose her left first fingernail.  She notes a constant numb feeling in her fingertips.  She is otherwise doing well and denies any other issues today.  A detailed ROS is otherwise non contributory.     PAST MEDICAL HISTORY: Past Medical History:  Diagnosis Date  . Angina pectoris ([place])   . Asthma   . Cancer ([place])    right breast  . Constipation   . Diverticulitis   . Headache   . HPV in female   . Hyperlipidemia   . Hypertension   . Hypothyroidism   . Wears dentures   . Wears glasses     PAST SURGICAL HISTORY: Past Surgical History:  Procedure Laterality Date  . CESAREAN SECTION    . COLON SURGERY  2012   sigmoidectomy  . COLOSTOMY    . COLOSTOMY TAKEDOWN  10/06/11  . IR BONE TUMOR(S)RF ABLATION  11/02/2017  . IR KYPHO THORACIC WITH BONE BIOPSY  11/02/2017  . IR RADIOLOGIST  EVAL & MGMT  10/07/2017  . IR RADIOLOGIST EVAL & MGMT  11/25/2017  . PORTACATH PLACEMENT N/A 09/25/2017   Procedure: INSERTION PORT-A-CATH WITH ULTRA SOUND ERAS PATHWAY;  Surgeon: [name], MD;  Location: [place];  Service: General;  Laterality: N/A;  . TONSILLECTOMY    . TUBAL LIGATION      FAMILY HISTORY Family History  Problem Relation Age of Onset  . Cancer Mother        rectal  . Hypertension Brother   . Hypertension Brother   . Asthma Son   . Asthma  Brother    The patient has little information regarding her father. Her mother died at age 23 from a strange cancer--the patient does not know what it was, it may well have been cervical cancer from her description. The patient has one brother, no sisters. There is no history of breast or ovarian cancer in the family to the patient's knowledge   GYNECOLOGIC HISTORY:  No LMP recorded. Patient is postmenopausal.  menarche age 19, first live birth age 19 the patient is Tracie [name] P3. She stopped having periods at age 71. She never used oral contraceptives or hormone replacement.   SOCIAL HISTORY:  Works as a Scientist, product/process development. She is divorced. Currently her son [name] lives with her. He is a Art gallery manager. The 2 other children are EDDYE BROXTERMAN, who lives in [place] and works in the theater and media, and Evagelia Knack lives in [place], and is vice president of a [employer]. The patient has 5 grandchildren. She is a Psychologist, forensic.     ADVANCED DIRECTIVES: Not in place    HEALTH MAINTENANCE: Social History   Tobacco Use  . Smoking status: Current Some Day Smoker    Packs/day: 0.50    Years: 40.00    Pack years: 20.00    Types: Cigarettes  . Smokeless tobacco: Never Used  . Tobacco comment: Pt already has info  Substance Use Topics  . Alcohol use: Yes    Comment: occasional  . Drug use: No     Colonoscopy: September 2012   PAP:  Bone density:09/03/2017    Allergies  Allergen Reactions  . Fish Allergy Anaphylaxis and Swelling    Swelling of hands and feet.  . Peanut-Containing Drug Products Anaphylaxis    Tree nuts included in allergic reaction.  . Ace Inhibitors Other (See Comments) and Cough    Cold like symptoms     Current Outpatient Medications  Medication Sig Dispense Refill  . albuterol (VENTOLIN HFA) 108 (90 BASE) MCG/ACT inhaler Inhale 1-2 puffs into the lungs every 6 (six) hours as needed for wheezing or shortness of breath. 1 Inhaler 6  .  Alirocumab (PRALUENT) 75 MG/ML SOPN Inject 75 mg into the muscle every 14 (fourteen) days.     Marland Kitchen amLODipine (NORVASC) 10 MG tablet Take 10 mg by mouth daily.     Marland Kitchen dexamethasone (DECADRON) 4 MG tablet Take 2 tablets (8 mg total) by mouth 2 (two) times daily. Start the day before Taxotere. Then again the day after chemo for 3 days. 30 tablet 1  . diphenoxylate-atropine (LOMOTIL) 2.5-0.025 MG tablet Take 1 tablet by mouth 4 (four) times daily as needed for diarrhea or loose stools. 60 tablet 0  . levothyroxine (SYNTHROID, LEVOTHROID) 50 MCG tablet Take 50 mcg by mouth daily.    Marland Kitchen lidocaine-prilocaine (EMLA) cream Apply to affected area once 30 g 3  . loratadine (CLARITIN) 10 MG tablet Take 1  tablet (10 mg total) by mouth daily. 20 tablet 3  . LORazepam (ATIVAN) 0.5 MG tablet Take 1 tablet (0.5 mg total) by mouth at bedtime as needed (Nausea or vomiting). 30 tablet 0  . losartan-hydrochlorothiazide (HYZAAR) 100-12.5 MG tablet Take 1 tablet by mouth daily.   3  . magic mouthwash SOLN Take 5 mLs 4 (four) times daily as needed by mouth for mouth pain. 240 mL 0  . metoprolol succinate (TOPROL-XL) 50 MG 24 hr tablet Take 50 mg by mouth daily.   6  . mometasone-formoterol (DULERA) 100-5 MCG/ACT AERO Inhale 2 puffs into the lungs 2 (two) times daily. 3 Inhaler 0  . Multiple Vitamins-Minerals (ICAPS AREDS 2) CAPS Take 1 capsule by mouth 2 (two) times daily. 60 capsule 3  . nitroGLYCERIN (NITROSTAT) 0.4 MG SL tablet Place 0.4 mg under the tongue every 5 (five) minutes as needed for chest pain.    Marland Kitchen ondansetron (ZOFRAN) 8 MG tablet Take 1 tablet (8 mg total) by mouth every 8 (eight) hours as needed for nausea or vomiting. 20 tablet 0  . potassium chloride (K-DUR) 10 MEQ tablet Take 2 tablets (20 mEq total) daily by mouth. 60 tablet 0  . prochlorperazine (COMPAZINE) 10 MG tablet Take 1 tablet (10 mg total) by mouth every 6 (six) hours as needed (Nausea or vomiting). 30 tablet 1  . Skin Protectants, Misc.  (EUCERIN) cream Apply topically as needed for dry skin. 454 g 0  . traMADol (ULTRAM) 50 MG tablet Take 1 tablet (50 mg total) by mouth every 8 (eight) hours as needed (pain). 30 tablet 0   No current facility-administered medications for this visit.    Facility-Administered Medications Ordered in Other Visits  Medication Dose Route Frequency Provider Last Rate Last Dose  . sodium chloride flush (NS) 0.9 % injection 10 mL  10 mL Intracatheter PRN Magrinat, [name], MD   10 mL at 12/03/17 1516    OBJECTIVE:   Vitals:   12/03/17 1052  BP: 128/60  Pulse: 85  Resp: 18  Temp: 98.6 F (37 C)  SpO2: 98%     Body mass index is 30.1 kg/m.   Wt Readings from Last 3 Encounters:  12/03/17 172 lb 9.6 oz (78.3 kg)  11/27/17 168 lb 8 oz (76.4 kg)  11/25/17 168 lb (76.2 kg)  ECOG FS:1 - Symptomatic but completely ambulatory GENERAL: Patient is a well appearing female in no acute distress HEENT:  Sclerae anicteric.  Oropharynx clear and moist.   No evidence of oropharyngeal candidiasis. Neck is supple.  NODES:  No cervical, supraclavicular, or axillary lymphadenopathy palpated.  BREAST EXAM:  Deferred. LUNGS:  Clear to auscultation bilaterally.  No wheezes or rhonchi. HEART:  Regular rate and rhythm. No murmur appreciated. ABDOMEN:  Soft, nontender.  Positive, normoactive bowel sounds. No organomegaly palpated. MSK:  No focal spinal tenderness to palpation. Full range of motion bilaterally in the upper extremities. EXTREMITIES:  No peripheral edema.   SKIN:  Skin on hands bilaterally is peeling, erythema has resolved. NEURO:  Nonfocal. Well oriented.  Appropriate affect.      LAB RESULTS:  CMP     Component Value Date/Time   NA 139 12/03/2017 0955   K 3.6 12/03/2017 0955   CL 93 (L) 11/02/2017 0755   CO2 27 12/03/2017 0955   GLUCOSE 132 12/03/2017 0955   BUN 9.1 12/03/2017 0955   CREATININE 0.8 12/03/2017 0955   CALCIUM 9.4 12/03/2017 0955   PROT 5.9 (L) 12/03/2017 2956  ALBUMIN 3.0 (L) 12/03/2017 0955   AST 14 12/03/2017 0955   ALT 17 12/03/2017 0955   ALKPHOS 92 12/03/2017 0955   BILITOT 0.24 12/03/2017 0955   GFRNONAA >60 11/02/2017 0755   GFRAA >60 11/02/2017 0755    No results found for: TOTALPROTELP, ALBUMINELP, A1GS, A2GS, BETS, BETA2SER, GAMS, MSPIKE, SPEI  No results found for: Nils Pyle, [place]  Lab Results  Component Value Date   WBC 4.8 12/03/2017   NEUTROABS 2.9 12/03/2017   HGB 9.0 (L) 12/03/2017   HCT 27.3 (L) 12/03/2017   MCV 90.4 12/03/2017   PLT 216 12/03/2017      Chemistry      Component Value Date/Time   NA 139 12/03/2017 0955   K 3.6 12/03/2017 0955   CL 93 (L) 11/02/2017 0755   CO2 27 12/03/2017 0955   BUN 9.1 12/03/2017 0955   CREATININE 0.8 12/03/2017 0955      Component Value Date/Time   CALCIUM 9.4 12/03/2017 0955   ALKPHOS 92 12/03/2017 0955   AST 14 12/03/2017 0955   ALT 17 12/03/2017 0955   BILITOT 0.24 12/03/2017 0955       No results found for: LABCA2  No components found for: [id number]    Appointment on 12/03/2017  Component Date Value Ref Range Status  . WBC 12/03/2017 4.8  3.9 - 10.3 10e3/uL Final  . NEUT# 12/03/2017 2.9  1.5 - 6.5 10e3/uL Final  . HGB 12/03/2017 9.0* 11.6 - 15.9 g/dL Final  . HCT 12/03/2017 27.3* 34.8 - 46.6 % Final  . Platelets 12/03/2017 216  145 - 400 10e3/uL Final  . MCV 12/03/2017 90.4  79.5 - 101.0 fL Final  . MCH 12/03/2017 29.8  25.1 - 34.0 pg Final  . MCHC 12/03/2017 33.0  31.5 - 36.0 g/dL Final  . RBC 12/03/2017 3.02* 3.70 - 5.45 10e6/uL Final  . RDW 12/03/2017 21.0* 11.2 - 14.5 % Final  . lymph# 12/03/2017 1.1  0.9 - 3.3 10e3/uL Final  . MONO# 12/03/2017 0.7  0.1 - 0.9 10e3/uL Final  . Eosinophils Absolute 12/03/2017 0.1  0.0 - 0.5 10e3/uL Final  . Basophils Absolute 12/03/2017 0.0  0.0 - 0.1 10e3/uL Final  . NEUT% 12/03/2017 59.7  38.4 - 76.8 % Final  . LYMPH% 12/03/2017 23.4  14.0 - 49.7 % Final  . MONO% 12/03/2017 14.8* 0.0 - 14.0  % Final  . EOS% 12/03/2017 1.9  0.0 - 7.0 % Final  . BASO% 12/03/2017 0.2  0.0 - 2.0 % Final  . Sodium 12/03/2017 139  136 - 145 mEq/L Final  . Potassium 12/03/2017 3.6  3.5 - 5.1 mEq/L Final  . Chloride 12/03/2017 103  98 - 109 mEq/L Final  . CO2 12/03/2017 27  22 - 29 mEq/L Final  . Glucose 12/03/2017 132  70 - 140 mg/dl Final   Glucose reference range is for nonfasting patients. Fasting glucose reference range is 70- 100.  Marland Kitchen BUN 12/03/2017 9.1  7.0 - 26.0 mg/dL Final  . Creatinine 12/03/2017 0.8  0.6 - 1.1 mg/dL Final  . Total Bilirubin 12/03/2017 0.24  0.20 - 1.20 mg/dL Final  . Alkaline Phosphatase 12/03/2017 92  40 - 150 U/L Final  . AST 12/03/2017 14  5 - 34 U/L Final  . ALT 12/03/2017 17  0 - 55 U/L Final  . Total Protein 12/03/2017 5.9* 6.4 - 8.3 g/dL Final  . Albumin 12/03/2017 3.0* 3.5 - 5.0 g/dL Final  . Calcium 12/03/2017 9.4  8.4 - 10.4 mg/dL  Final  . Anion Gap 12/03/2017 9  3 - 11 mEq/L Final  . EGFR 12/03/2017 >60  >60 ml/min/1.73 m2 Final   eGFR is calculated using the CKD-EPI Creatinine Equation (2009)    (this displays the last labs from the last 3 days)  No results found for: TOTALPROTELP, ALBUMINELP, A1GS, A2GS, BETS, BETA2SER, GAMS, MSPIKE, SPEI (this displays SPEP labs)  No results found for: KPAFRELGTCHN, LAMBDASER, KAPLAMBRATIO (kappa/lambda light chains)  No results found for: HGBA, HGBA2QUANT, HGBFQUANT, HGBSQUAN (Hemoglobinopathy evaluation)   No results found for: LDH  No results found for: IRON, TIBC, IRONPCTSAT (Iron and TIBC)  No results found for: FERRITIN  Urinalysis    Component Value Date/Time   LABSPEC 1.020 02/10/2011 2058   PHURINE 6.0 02/10/2011 2058   HGBUR LARGE (A) 02/10/2011 2058   BILIRUBINUR MODERATE (A) 02/10/2011 2058   KETONESUR 15 (A) 02/10/2011 2058   PROTEINUR >=300 (A) 02/10/2011 2058   UROBILINOGEN >=8.0 02/10/2011 2058   NITRITE NEGATIVE 02/10/2011 2058   LEUKOCYTESUR  02/10/2011 2058    NEGATIVE Biochemical  Testing Only. Please order routine urinalysis from main lab if confirmatory testing is needed.     STUDIES:  Nm Pet Image Initial (pi) Skull Base To Thigh  Result Date: 12/01/2017 CLINICAL DATA:  Initial treatment strategy for right breast cancer. EXAM: NUCLEAR MEDICINE PET SKULL BASE TO THIGH TECHNIQUE: 8.2 mCi F-18 FDG was injected intravenously. Full-ring PET imaging was performed from the skull base to thigh after the radiotracer. CT data was obtained and used for attenuation correction and anatomic localization. FASTING BLOOD GLUCOSE:  Value: 86 mg/dl COMPARISON:  CT exams from 10/13/2017 and 09/24/2017 FINDINGS: NECK No hypermetabolic lymph nodes in the neck. Accentuated activity in the hard palate/anterior tongue region without any CT correlate and without disruption of tissue planes, likely physiologic. Accentuated activity in the right masseter muscle, considered physiologic. Atherosclerotic calcification of the common carotid arteries. Mild chronic right maxillary sinusitis. CHEST At the lumpectomy site there is only vague low level activity, maximum SUV 1.6. A crescentic right axillary lymph node with parenchymal portion measuring about 5 mm in thickness, and with a large fatty hilum, has maximum standard uptake value of 2.3. Similar appearing contralateral left-sided lymph node within even thinner parenchymal component has a maximum SUV of 1.5. Background mediastinal blood pool activity SUV: 2.6. Right Port-A-Cath tip: SVC. Coronary, aortic arch, and branch vessel atherosclerotic vascular disease. Mild cardiomegaly. Centrilobular emphysema. No significant pulmonary nodule is identified. Subsegmental atelectasis or scarring in both lower lobes. ABDOMEN/PELVIS No abnormal hypermetabolic activity within the liver, pancreas, adrenal glands, or spleen. Low-density fullness of both adrenal glands suspicious for adrenal adenomas. Aortoiliac atherosclerotic vascular disease. No abnormal hypermetabolic  leak activity along the left adnexal lesion. No hypermetabolic lymph nodes in the abdomen or pelvis. SKELETON Interval kyphoplasty at the T8 with methacrylate occupying the space of the prior biopsy proven lytic metastatic lesion. Surprisingly little in the way of residual metabolic activity at this level with maximum SUV of only 4.0. No other osseous metastatic involvement identified. IMPRESSION: 1. Interval resection of the right lateral breast mass and adjacent enlarged lymph node. Interval kyphoplasty of the T8 vertebral lesion. There is some low-grade activity at the site of the prior kyphoplasty with maximum SUV 4.0, but otherwise no residual significant hypermetabolic activity to suggest malignancy is identified in the neck, chest, abdomen/pelvis, or visualized skeleton. 2. Small and thin bilateral axillary lymph nodes are below background mediastinal blood pool activity and most likely benign. 3. Other  imaging findings of potential clinical significance: Aortic Atherosclerosis (ICD10-I70.0) and Emphysema (ICD10-J43.9). Mild cardiomegaly. Suspected small bilateral adrenal adenomas. Mild chronic right maxillary sinusitis. Electronically Signed   By: Van Clines M.D.   On: 12/01/2017 08:27   Ir Radiologist Eval & Mgmt  Result Date: 11/26/2017 Please refer to notes tab for details about interventional procedure. (Op Note)    ELIGIBLE FOR AVAILABLE RESEARCH PROTOCOL: no  ASSESSMENT: 66 y.o. [place] woman status post right breast upper outer quadrant and right axillary lymph node biopsy 09/03/2017, both positive for invasive ductal carcinoma, grade 3, estrogen receptor positive, progesterone receptor negative, with an MIB-1 of 90%, the lymph node being HER-2 positive, the breast mass HER-2 negative  (a) staging studies September 24, 2017 show a lytic lesion in T8, possible areas of concern at L1-L2  (b) osteochondral/biopsy procedure of T8 lesion scheduled for November 02, 2017  (1)  neoadjuvant chemotherapy with carboplatin, docetaxel, trastuzumab, and pertuzumab started 10/01/2017  (2) trastuzumab to continue to complete 12 months  (3) definitive surgery to follow chemotherapy  (4) adjuvant radiation to follow  (5) anti-estrogens to start at the completion of local treatment  PLAN:  Faron is doing moderately well today.  I reviewed her PET scan results with her today and the fact that there was only disease in T8. Due to her issues with the Docetaxel the neuropathy, and the skin peeling, she will not receive any further treatment with Docetaxel.  We attempted to switch her to Gemcitabine, Carboplatin today and per [place] in chemo authorizations, her insurance requires a 2 day notification period for this.  We therefore have requested she return next Tuesday for treatment.  She will proceed with Trastuzumab/Pertuzumab today however.   I will see Henriette back on Tuesday, 12/08/17.    Minal knows to call for any questions or concerns prior to her next appointment with [place].  She verbalizes understanding of the above plan.     Wilber Bihari, NP  12/03/17 4:11 PM Medical Oncology and Hematology [facility] [address] [place], [place] [address] Tel. [phone]    Fax. [phone]   ADDENDUM: Tracie [name] is experiencing some of the expected side effects of chemo including loosening of nails pain of the nailbeds and probably loss of the nail or 2.  She understands that all that is reversible and she will have normal nails eventually.  I am more concerned about the fact that she is experiencing numbness and even a little bit of pain in her finger pads and toe pads.  This is neuropathy, and that can be irreversible.  Accordingly we are not going to give her her chemo today.  We are going to switch from Taxotere to gemcitabine to complete her final 3 cycles.  She will receive this at 800 mg/m on day 1 only.  This of course is not associated with  neuropathy and so I expect that problem to improve over the next several weeks.  She will have the anti-HER-2 treatments today.  The carboplatin and gemcitabine will be given early next week, and then again with her next cycle of anti-HER-2 treatments.  Tracie [name] is a good understanding of this change and agrees with it.   I personally saw this patient and performed a substantive portion of this encounter with the listed APP documented above.   Chauncey Cruel, MD Medical Oncology and Hematology [facility] [address] [place], [place] [address] Tel. [phone]    Fax. [phone]

## 2017-12-03 ENCOUNTER — Other Ambulatory Visit: Payer: Self-pay | Admitting: Adult Health

## 2017-12-03 ENCOUNTER — Ambulatory Visit (HOSPITAL_BASED_OUTPATIENT_CLINIC_OR_DEPARTMENT_OTHER): Payer: 59

## 2017-12-03 ENCOUNTER — Ambulatory Visit: Payer: 59

## 2017-12-03 ENCOUNTER — Ambulatory Visit (HOSPITAL_BASED_OUTPATIENT_CLINIC_OR_DEPARTMENT_OTHER): Payer: 59 | Admitting: Adult Health

## 2017-12-03 ENCOUNTER — Other Ambulatory Visit (HOSPITAL_BASED_OUTPATIENT_CLINIC_OR_DEPARTMENT_OTHER): Payer: 59

## 2017-12-03 ENCOUNTER — Encounter: Payer: Self-pay | Admitting: Adult Health

## 2017-12-03 VITALS — BP 132/56 | HR 89 | Temp 98.0°F | Resp 18

## 2017-12-03 VITALS — BP 128/60 | HR 85 | Temp 98.6°F | Resp 18 | Ht 63.5 in | Wt 172.6 lb

## 2017-12-03 DIAGNOSIS — L608 Other nail disorders: Secondary | ICD-10-CM

## 2017-12-03 DIAGNOSIS — C50411 Malignant neoplasm of upper-outer quadrant of right female breast: Secondary | ICD-10-CM

## 2017-12-03 DIAGNOSIS — Z170 Estrogen receptor positive status [ER+]: Principal | ICD-10-CM

## 2017-12-03 DIAGNOSIS — G620 Drug-induced polyneuropathy: Secondary | ICD-10-CM | POA: Diagnosis not present

## 2017-12-03 DIAGNOSIS — Z5112 Encounter for antineoplastic immunotherapy: Secondary | ICD-10-CM

## 2017-12-03 LAB — CBC WITH DIFFERENTIAL/PLATELET
BASO%: 0.2 % (ref 0.0–2.0)
BASOS ABS: 0 10*3/uL (ref 0.0–0.1)
EOS ABS: 0.1 10*3/uL (ref 0.0–0.5)
EOS%: 1.9 % (ref 0.0–7.0)
HCT: 27.3 % — ABNORMAL LOW (ref 34.8–46.6)
HGB: 9 g/dL — ABNORMAL LOW (ref 11.6–15.9)
LYMPH%: 23.4 % (ref 14.0–49.7)
MCH: 29.8 pg (ref 25.1–34.0)
MCHC: 33 g/dL (ref 31.5–36.0)
MCV: 90.4 fL (ref 79.5–101.0)
MONO#: 0.7 10*3/uL (ref 0.1–0.9)
MONO%: 14.8 % — ABNORMAL HIGH (ref 0.0–14.0)
NEUT#: 2.9 10*3/uL (ref 1.5–6.5)
NEUT%: 59.7 % (ref 38.4–76.8)
PLATELETS: 216 10*3/uL (ref 145–400)
RBC: 3.02 10*6/uL — AB (ref 3.70–5.45)
RDW: 21 % — ABNORMAL HIGH (ref 11.2–14.5)
WBC: 4.8 10*3/uL (ref 3.9–10.3)
lymph#: 1.1 10*3/uL (ref 0.9–3.3)

## 2017-12-03 LAB — COMPREHENSIVE METABOLIC PANEL
ALT: 17 U/L (ref 0–55)
ANION GAP: 9 meq/L (ref 3–11)
AST: 14 U/L (ref 5–34)
Albumin: 3 g/dL — ABNORMAL LOW (ref 3.5–5.0)
Alkaline Phosphatase: 92 U/L (ref 40–150)
BUN: 9.1 mg/dL (ref 7.0–26.0)
CHLORIDE: 103 meq/L (ref 98–109)
CO2: 27 meq/L (ref 22–29)
Calcium: 9.4 mg/dL (ref 8.4–10.4)
Creatinine: 0.8 mg/dL (ref 0.6–1.1)
Glucose: 132 mg/dl (ref 70–140)
POTASSIUM: 3.6 meq/L (ref 3.5–5.1)
Sodium: 139 mEq/L (ref 136–145)
Total Bilirubin: 0.24 mg/dL (ref 0.20–1.20)
Total Protein: 5.9 g/dL — ABNORMAL LOW (ref 6.4–8.3)

## 2017-12-03 MED ORDER — TRASTUZUMAB CHEMO 150 MG IV SOLR
6.0000 mg/kg | Freq: Once | INTRAVENOUS | Status: AC
  Administered 2017-12-03: 504 mg via INTRAVENOUS
  Filled 2017-12-03: qty 24

## 2017-12-03 MED ORDER — DIPHENHYDRAMINE HCL 25 MG PO CAPS
25.0000 mg | ORAL_CAPSULE | Freq: Once | ORAL | Status: AC
  Administered 2017-12-03: 25 mg via ORAL

## 2017-12-03 MED ORDER — SODIUM CHLORIDE 0.9 % IV SOLN
420.0000 mg | Freq: Once | INTRAVENOUS | Status: AC
  Administered 2017-12-03: 420 mg via INTRAVENOUS
  Filled 2017-12-03: qty 14

## 2017-12-03 MED ORDER — TRAMADOL HCL 50 MG PO TABS: 50.0000 mg | ORAL_TABLET | Freq: Three times a day (TID) | ORAL | 0 refills | Status: DC | PRN

## 2017-12-03 MED ORDER — SODIUM CHLORIDE 0.9% FLUSH
10.0000 mL | INTRAVENOUS | Status: DC | PRN
  Administered 2017-12-03: 10 mL
  Filled 2017-12-03: qty 10

## 2017-12-03 MED ORDER — SODIUM CHLORIDE 0.9 % IV SOLN
Freq: Once | INTRAVENOUS | Status: AC
  Administered 2017-12-03: 13:00:00 via INTRAVENOUS

## 2017-12-03 MED ORDER — ACETAMINOPHEN 325 MG PO TABS
650.0000 mg | ORAL_TABLET | Freq: Once | ORAL | Status: AC
  Administered 2017-12-03: 650 mg via ORAL

## 2017-12-03 MED ORDER — SODIUM CHLORIDE 0.9% FLUSH
10.0000 mL | Freq: Once | INTRAVENOUS | Status: AC
  Administered 2017-12-03: 10 mL
  Filled 2017-12-03: qty 10

## 2017-12-03 MED ORDER — HEPARIN SOD (PORK) LOCK FLUSH 100 UNIT/ML IV SOLN
500.0000 [IU] | Freq: Once | INTRAVENOUS | Status: AC | PRN
  Administered 2017-12-03: 500 [IU]
  Filled 2017-12-03: qty 5

## 2017-12-03 MED ORDER — ACETAMINOPHEN 325 MG PO TABS
ORAL_TABLET | ORAL | Status: AC
  Filled 2017-12-03: qty 2

## 2017-12-03 MED ORDER — DIPHENHYDRAMINE HCL 25 MG PO CAPS
ORAL_CAPSULE | ORAL | Status: AC
  Filled 2017-12-03: qty 1

## 2017-12-03 NOTE — Patient Instructions (Signed)
[facility] Discharge Instructions for Patients Receiving Chemotherapy  Today you received the following chemotherapy agents: Herceptin, Perjeta  To help prevent nausea and vomiting after your treatment, we encourage you to take your nausea medication as directed.   If you develop nausea and vomiting that is not controlled by your nausea medication, call the clinic.   BELOW ARE SYMPTOMS THAT SHOULD BE REPORTED IMMEDIATELY:  *FEVER GREATER THAN 100.5 F  *CHILLS WITH OR WITHOUT FEVER  NAUSEA AND VOMITING THAT IS NOT CONTROLLED WITH YOUR NAUSEA MEDICATION  *UNUSUAL SHORTNESS OF BREATH  *UNUSUAL BRUISING OR BLEEDING  TENDERNESS IN MOUTH AND THROAT WITH OR WITHOUT PRESENCE OF ULCERS  *URINARY PROBLEMS  *BOWEL PROBLEMS  UNUSUAL RASH Items with * indicate a potential emergency and should be followed up as soon as possible.  Feel free to call the clinic should you have any questions or concerns. The clinic phone number is [phone].  Please show the CHEMO ALERT CARD at check-in to the Emergency Department and triage nurse.   

## 2017-12-03 NOTE — Progress Notes (Signed)
Per [name], Herceptin and Perjeta only to be given today. Her treatment plan has been adjusted for her chemotherapy.

## 2017-12-08 ENCOUNTER — Ambulatory Visit: Payer: 59

## 2017-12-10 ENCOUNTER — Telehealth: Payer: Self-pay

## 2017-12-10 NOTE — Telephone Encounter (Signed)
Pt lvm stating "need to be rescheduled for procedure". This RN returned call and lvm at number provided instructing pt to call back.

## 2017-12-11 ENCOUNTER — Telehealth: Payer: Self-pay | Admitting: Oncology

## 2017-12-11 NOTE — Telephone Encounter (Signed)
Left message for patient regarding upcoming December appointments per 12/12 sch message  °

## 2017-12-14 ENCOUNTER — Other Ambulatory Visit: Payer: Self-pay | Admitting: Oncology

## 2017-12-15 ENCOUNTER — Ambulatory Visit (HOSPITAL_BASED_OUTPATIENT_CLINIC_OR_DEPARTMENT_OTHER): Payer: 59

## 2017-12-15 ENCOUNTER — Other Ambulatory Visit: Payer: Self-pay | Admitting: Oncology

## 2017-12-15 ENCOUNTER — Other Ambulatory Visit: Payer: Self-pay | Admitting: *Deleted

## 2017-12-15 VITALS — BP 122/66 | HR 90 | Temp 99.1°F | Resp 17 | Ht 63.5 in | Wt 170.0 lb

## 2017-12-15 DIAGNOSIS — C50411 Malignant neoplasm of upper-outer quadrant of right female breast: Secondary | ICD-10-CM | POA: Diagnosis not present

## 2017-12-15 DIAGNOSIS — Z170 Estrogen receptor positive status [ER+]: Principal | ICD-10-CM

## 2017-12-15 DIAGNOSIS — Z5111 Encounter for antineoplastic chemotherapy: Secondary | ICD-10-CM | POA: Diagnosis not present

## 2017-12-15 LAB — CBC WITH DIFFERENTIAL/PLATELET
BASO%: 0.5 % (ref 0.0–2.0)
BASOS ABS: 0 10*3/uL (ref 0.0–0.1)
EOS ABS: 0.2 10*3/uL (ref 0.0–0.5)
EOS%: 3.5 % (ref 0.0–7.0)
HEMATOCRIT: 31.9 % — AB (ref 34.8–46.6)
HEMOGLOBIN: 10.3 g/dL — AB (ref 11.6–15.9)
LYMPH%: 29.4 % (ref 14.0–49.7)
MCH: 30 pg (ref 25.1–34.0)
MCHC: 32.3 g/dL (ref 31.5–36.0)
MCV: 93 fL (ref 79.5–101.0)
MONO#: 0.5 10*3/uL (ref 0.1–0.9)
MONO%: 8 % (ref 0.0–14.0)
NEUT%: 58.6 % (ref 38.4–76.8)
NEUTROS ABS: 3.5 10*3/uL (ref 1.5–6.5)
PLATELETS: 355 10*3/uL (ref 145–400)
RBC: 3.43 10*6/uL — ABNORMAL LOW (ref 3.70–5.45)
RDW: 21.4 % — ABNORMAL HIGH (ref 11.2–14.5)
WBC: 6 10*3/uL (ref 3.9–10.3)
lymph#: 1.8 10*3/uL (ref 0.9–3.3)

## 2017-12-15 LAB — COMPREHENSIVE METABOLIC PANEL
ALBUMIN: 3.4 g/dL — AB (ref 3.5–5.0)
ALT: 13 U/L (ref 0–55)
ANION GAP: 9 meq/L (ref 3–11)
AST: 12 U/L (ref 5–34)
Alkaline Phosphatase: 100 U/L (ref 40–150)
BILIRUBIN TOTAL: 0.29 mg/dL (ref 0.20–1.20)
BUN: 11.7 mg/dL (ref 7.0–26.0)
CALCIUM: 9.5 mg/dL (ref 8.4–10.4)
CO2: 28 mEq/L (ref 22–29)
CREATININE: 0.9 mg/dL (ref 0.6–1.1)
Chloride: 103 mEq/L (ref 98–109)
EGFR: >60 mL/min/{1.73_m2} (ref >60)
Glucose: 128 mg/dl (ref 70–140)
Potassium: 3.4 mEq/L — ABNORMAL LOW (ref 3.5–5.1)
Sodium: 140 mEq/L (ref 136–145)
TOTAL PROTEIN: 6.7 g/dL (ref 6.4–8.3)

## 2017-12-15 MED ORDER — HEPARIN SOD (PORK) LOCK FLUSH 100 UNIT/ML IV SOLN
500.0000 [IU] | Freq: Once | INTRAVENOUS | Status: AC | PRN
  Administered 2017-12-15: 500 [IU]
  Filled 2017-12-15: qty 5

## 2017-12-15 MED ORDER — PALONOSETRON HCL INJECTION 0.25 MG/5ML
0.2500 mg | Freq: Once | INTRAVENOUS | Status: AC
  Administered 2017-12-15: 0.25 mg via INTRAVENOUS

## 2017-12-15 MED ORDER — SODIUM CHLORIDE 0.9 % IV SOLN
Freq: Once | INTRAVENOUS | Status: AC
  Administered 2017-12-15: 10:00:00 via INTRAVENOUS

## 2017-12-15 MED ORDER — CARBOPLATIN CHEMO INJECTION 600 MG/60ML
485.5000 mg | Freq: Once | INTRAVENOUS | Status: AC
  Administered 2017-12-15: 490 mg via INTRAVENOUS
  Filled 2017-12-15: qty 49

## 2017-12-15 MED ORDER — GEMCITABINE HCL CHEMO INJECTION 1 GM/26.3ML
800.0000 mg/m2 | Freq: Once | INTRAVENOUS | Status: AC
  Administered 2017-12-15: 1558 mg via INTRAVENOUS
  Filled 2017-12-15: qty 40.98

## 2017-12-15 MED ORDER — PALONOSETRON HCL INJECTION 0.25 MG/5ML
INTRAVENOUS | Status: AC
  Filled 2017-12-15: qty 5

## 2017-12-15 MED ORDER — SODIUM CHLORIDE 0.9% FLUSH
10.0000 mL | INTRAVENOUS | Status: DC | PRN
  Administered 2017-12-15: 10 mL
  Filled 2017-12-15: qty 10

## 2017-12-15 MED ORDER — SODIUM CHLORIDE 0.9 % IV SOLN
Freq: Once | INTRAVENOUS | Status: AC
  Administered 2017-12-15: 11:00:00 via INTRAVENOUS
  Filled 2017-12-15: qty 5

## 2017-12-15 NOTE — Progress Notes (Unsigned)
Tracie [name]  [phone] [phone]     ID: Tracie [name] DOB: 03/13/1951  MR#: [id number]  ESP#:[id number]  Patient Care Team: Bartholome Bill, MD as PCP - General (Family Medicine) [name], MD as Consulting Physician (General Surgery) Lamel Mccarley, [name], MD as Consulting Physician (Oncology) [name], MD as Attending Physician (Radiation Oncology) OTHER MD:  CHIEF COMPLAINT: Triple positive breast cancer  CURRENT TREATMENT: Neoadjuvant chemotherapy/immunotherapy.   HISTORY OF CURRENT ILLNESS: From the original intake note:  Tracie [name] noted a change in her right breast sometime in July 2018. [facility] to report that she had found a "kernel" in her breast. She was advised to see her primary physician which she did. The patient was then set up for bilateral diagnostic mammography with tomography and right breast ultrasonography at [facility] 09/03/2017. The breast density was category I a. In the upper outer quadrant of the right breast there was a 2.1 cm high density mass, which was palpable. Also in the right breast more posteriorly there was a 1.5 cm high density mass which was felt to be a suspicious lymph node. Ultrasound of the right breast confirmed a 2.1 centimeter lobulated solid mass in the right breast upper outer quadrant 15.8 cm from the nipple in the 10:00 radiant. There was a 1.5 cm oval mass in the right axillary tail with a small rounded masses also suspicious for lymph node involvement. A total of 3 suspicious lymph nodes were identified.  On 09/03/2017 the patient underwent biopsy of the breast mass and the suspicious right axillary lymph node. The final pathology ([id number]) found invasive ductal carcinoma, grade 3, in both. Both tumors were estrogen receptor positive at 70-75%, and both were progesterone receptor negative. Both had an elevated proliferation marker at 90%. The mass in the breast was HER-2  negative, with a signals ratio of 1.25 and the number per cell 1.88. The lymph node mass however was HER-2 positive with a signals ratio of 2.57, and the number per cell 3.60.  The patient's subsequent history is as detailed below.  INTERVAL HISTORY: Tracie [name] returns today for follow-up and treatment of her triple positive breast cancer, which is being treated neo-adjuvantly. Today is day 1 cycle 4 of 6 planned cycles of carboplatin, docetaxel, trastuzumab, and Pertuzumab.     REVIEW OF SYSTEMS: Tracie [name] is doing moderately well today.  She is continuing to have peeling of her hands bilaterally.  She says that she feels like she is going to lose her left first fingernail.  She notes a constant numb feeling in her fingertips.  She is otherwise doing well and denies any other issues today.  A detailed ROS is otherwise non contributory.     PAST MEDICAL HISTORY: Past Medical History:  Diagnosis Date  . Angina pectoris ([place])   . Asthma   . Cancer ([place])    right breast  . Constipation   . Diverticulitis   . Headache   . HPV in female   . Hyperlipidemia   . Hypertension   . Hypothyroidism   . Wears dentures   . Wears glasses     PAST SURGICAL HISTORY: Past Surgical History:  Procedure Laterality Date  . CESAREAN SECTION    . COLON SURGERY  2012   sigmoidectomy  . COLOSTOMY    . COLOSTOMY TAKEDOWN  10/06/11  . IR BONE TUMOR(S)RF ABLATION  11/02/2017  . IR KYPHO THORACIC WITH BONE BIOPSY  11/02/2017  . IR RADIOLOGIST  EVAL & MGMT  10/07/2017  . IR RADIOLOGIST EVAL & MGMT  11/25/2017  . PORTACATH PLACEMENT N/A 09/25/2017   Procedure: INSERTION PORT-A-CATH WITH ULTRA SOUND ERAS PATHWAY;  Surgeon: [name], MD;  Location: [place];  Service: General;  Laterality: N/A;  . TONSILLECTOMY    . TUBAL LIGATION      FAMILY HISTORY Family History  Problem Relation Age of Onset  . Cancer Mother        rectal  . Hypertension Brother   . Hypertension Brother   . Asthma Son   . Asthma  Brother    The patient has little information regarding her father. Her mother died at age 28 from a strange cancer--the patient does not know what it was, it may well have been cervical cancer from her description. The patient has one brother, no sisters. There is no history of breast or ovarian cancer in the family to the patient's knowledge   GYNECOLOGIC HISTORY:  No LMP recorded. Patient is postmenopausal.  menarche age 19, first live birth age 43 the patient is Tracie [name] P3. She stopped having periods at age 64. She never used oral contraceptives or hormone replacement.   SOCIAL HISTORY:  Works as a Scientist, product/process development. She is divorced. Currently her son Tracie [name] lives with her. He is a Art gallery manager. The 2 other children are Tracie [name], who lives in [place] and works in the theater and media, and Tracie [name] lives in [place], and is vice president of a [employer]. The patient has 5 grandchildren. She is a Psychologist, forensic.     ADVANCED DIRECTIVES: Not in place    HEALTH MAINTENANCE: Social History   Tobacco Use  . Smoking status: Current Some Day Smoker    Packs/day: 0.50    Years: 40.00    Pack years: 20.00    Types: Cigarettes  . Smokeless tobacco: Never Used  . Tobacco comment: Pt already has info  Substance Use Topics  . Alcohol use: Yes    Comment: occasional  . Drug use: No     Colonoscopy: September 2012   PAP:  Bone density:09/03/2017    Allergies  Allergen Reactions  . Fish Allergy Anaphylaxis and Swelling    Swelling of hands and feet.  . Peanut-Containing Drug Products Anaphylaxis    Tree nuts included in allergic reaction.  . Ace Inhibitors Other (See Comments) and Cough    Cold like symptoms     Current Outpatient Medications  Medication Sig Dispense Refill  . albuterol (VENTOLIN HFA) 108 (90 BASE) MCG/ACT inhaler Inhale 1-2 puffs into the lungs every 6 (six) hours as needed for wheezing or shortness of breath. 1 Inhaler 6  .  Alirocumab (PRALUENT) 75 MG/ML SOPN Inject 75 mg into the muscle every 14 (fourteen) days.     Marland Kitchen amLODipine (NORVASC) 10 MG tablet Take 10 mg by mouth daily.     Marland Kitchen dexamethasone (DECADRON) 4 MG tablet Take 2 tablets (8 mg total) by mouth 2 (two) times daily. Start the day before Taxotere. Then again the day after chemo for 3 days. 30 tablet 1  . diphenoxylate-atropine (LOMOTIL) 2.5-0.025 MG tablet Take 1 tablet by mouth 4 (four) times daily as needed for diarrhea or loose stools. 60 tablet 0  . levothyroxine (SYNTHROID, LEVOTHROID) 50 MCG tablet Take 50 mcg by mouth daily.    Marland Kitchen lidocaine-prilocaine (EMLA) cream Apply to affected area once 30 g 3  . loratadine (CLARITIN) 10 MG tablet Take 1  tablet (10 mg total) by mouth daily. 20 tablet 3  . LORazepam (ATIVAN) 0.5 MG tablet Take 1 tablet (0.5 mg total) by mouth at bedtime as needed (Nausea or vomiting). 30 tablet 0  . losartan-hydrochlorothiazide (HYZAAR) 100-12.5 MG tablet Take 1 tablet by mouth daily.   3  . magic mouthwash SOLN Take 5 mLs 4 (four) times daily as needed by mouth for mouth pain. 240 mL 0  . metoprolol succinate (TOPROL-XL) 50 MG 24 hr tablet Take 50 mg by mouth daily.   6  . mometasone-formoterol (DULERA) 100-5 MCG/ACT AERO Inhale 2 puffs into the lungs 2 (two) times daily. 3 Inhaler 0  . Multiple Vitamins-Minerals (ICAPS AREDS 2) CAPS Take 1 capsule by mouth 2 (two) times daily. 60 capsule 3  . nitroGLYCERIN (NITROSTAT) 0.4 MG SL tablet Place 0.4 mg under the tongue every 5 (five) minutes as needed for chest pain.    Marland Kitchen ondansetron (ZOFRAN) 8 MG tablet Take 1 tablet (8 mg total) by mouth every 8 (eight) hours as needed for nausea or vomiting. 20 tablet 0  . potassium chloride (K-DUR) 10 MEQ tablet Take 2 tablets (20 mEq total) daily by mouth. 60 tablet 0  . prochlorperazine (COMPAZINE) 10 MG tablet Take 1 tablet (10 mg total) by mouth every 6 (six) hours as needed (Nausea or vomiting). 30 tablet 1  . Skin Protectants, Misc.  (EUCERIN) cream Apply topically as needed for dry skin. 454 g 0  . traMADol (ULTRAM) 50 MG tablet Take 1 tablet (50 mg total) by mouth every 8 (eight) hours as needed (pain). 30 tablet 0   No current facility-administered medications for this visit.     OBJECTIVE:   There were no vitals filed for this visit.   There is no height or weight on file to calculate BMI.   Wt Readings from Last 3 Encounters:  12/15/17 170 lb (77.1 kg)  12/03/17 172 lb 9.6 oz (78.3 kg)  11/27/17 168 lb 8 oz (76.4 kg)  ECOG FS:1 - Symptomatic but completely ambulatory GENERAL: Patient is a well appearing female in no acute distress HEENT:  Sclerae anicteric.  Oropharynx clear and moist.   No evidence of oropharyngeal candidiasis. Neck is supple.  NODES:  No cervical, supraclavicular, or axillary lymphadenopathy palpated.  BREAST EXAM:  Deferred. LUNGS:  Clear to auscultation bilaterally.  No wheezes or rhonchi. HEART:  Regular rate and rhythm. No murmur appreciated. ABDOMEN:  Soft, nontender.  Positive, normoactive bowel sounds. No organomegaly palpated. MSK:  No focal spinal tenderness to palpation. Full range of motion bilaterally in the upper extremities. EXTREMITIES:  No peripheral edema.   SKIN:  Skin on hands bilaterally is peeling, erythema has resolved. NEURO:  Nonfocal. Well oriented.  Appropriate affect.      LAB RESULTS:  CMP     Component Value Date/Time   NA 140 12/15/2017 0924   K 3.4 (L) 12/15/2017 0924   CL 93 (L) 11/02/2017 0755   CO2 28 12/15/2017 0924   GLUCOSE 128 12/15/2017 0924   BUN 11.7 12/15/2017 0924   CREATININE 0.9 12/15/2017 0924   CALCIUM 9.5 12/15/2017 0924   PROT 6.7 12/15/2017 0924   ALBUMIN 3.4 (L) 12/15/2017 0924   AST 12 12/15/2017 0924   ALT 13 12/15/2017 0924   ALKPHOS 100 12/15/2017 0924   BILITOT 0.29 12/15/2017 0924   GFRNONAA >60 11/02/2017 0755   GFRAA >60 11/02/2017 0755    No results found for: TOTALPROTELP, ALBUMINELP, A1GS, A2GS, BETS,  BETA2SER, GAMS,  MSPIKE, SPEI  No results found for: Nils Pyle, [facility]  Lab Results  Component Value Date   WBC 6.0 12/15/2017   NEUTROABS 3.5 12/15/2017   HGB 10.3 (L) 12/15/2017   HCT 31.9 (L) 12/15/2017   MCV 93.0 12/15/2017   PLT 355 12/15/2017      Chemistry      Component Value Date/Time   NA 140 12/15/2017 0924   K 3.4 (L) 12/15/2017 0924   CL 93 (L) 11/02/2017 0755   CO2 28 12/15/2017 0924   BUN 11.7 12/15/2017 0924   CREATININE 0.9 12/15/2017 0924      Component Value Date/Time   CALCIUM 9.5 12/15/2017 0924   ALKPHOS 100 12/15/2017 0924   AST 12 12/15/2017 0924   ALT 13 12/15/2017 0924   BILITOT 0.29 12/15/2017 0924       No results found for: LABCA2  No components found for: HALPFX902    Appointment on 12/15/2017  Component Date Value Ref Range Status  . WBC 12/15/2017 6.0  3.9 - 10.3 10e3/uL Final  . NEUT# 12/15/2017 3.5  1.5 - 6.5 10e3/uL Final  . HGB 12/15/2017 10.3* 11.6 - 15.9 g/dL Final  . HCT 12/15/2017 31.9* 34.8 - 46.6 % Final  . Platelets 12/15/2017 355  145 - 400 10e3/uL Final  . MCV 12/15/2017 93.0  79.5 - 101.0 fL Final  . MCH 12/15/2017 30.0  25.1 - 34.0 pg Final  . MCHC 12/15/2017 32.3  31.5 - 36.0 g/dL Final  . RBC 12/15/2017 3.43* 3.70 - 5.45 10e6/uL Final  . RDW 12/15/2017 21.4* 11.2 - 14.5 % Final  . lymph# 12/15/2017 1.8  0.9 - 3.3 10e3/uL Final  . MONO# 12/15/2017 0.5  0.1 - 0.9 10e3/uL Final  . Eosinophils Absolute 12/15/2017 0.2  0.0 - 0.5 10e3/uL Final  . Basophils Absolute 12/15/2017 0.0  0.0 - 0.1 10e3/uL Final  . NEUT% 12/15/2017 58.6  38.4 - 76.8 % Final  . LYMPH% 12/15/2017 29.4  14.0 - 49.7 % Final  . MONO% 12/15/2017 8.0  0.0 - 14.0 % Final  . EOS% 12/15/2017 3.5  0.0 - 7.0 % Final  . BASO% 12/15/2017 0.5  0.0 - 2.0 % Final  . Sodium 12/15/2017 140  136 - 145 mEq/L Final  . Potassium 12/15/2017 3.4* 3.5 - 5.1 mEq/L Final  . Chloride 12/15/2017 103  98 - 109 mEq/L Final  . CO2 12/15/2017 28  22  - 29 mEq/L Final  . Glucose 12/15/2017 128  70 - 140 mg/dl Final   Glucose reference range is for nonfasting patients. Fasting glucose reference range is 70- 100.  Marland Kitchen BUN 12/15/2017 11.7  7.0 - 26.0 mg/dL Final  . Creatinine 12/15/2017 0.9  0.6 - 1.1 mg/dL Final  . Total Bilirubin 12/15/2017 0.29  0.20 - 1.20 mg/dL Final  . Alkaline Phosphatase 12/15/2017 100  40 - 150 U/L Final  . AST 12/15/2017 12  5 - 34 U/L Final  . ALT 12/15/2017 13  0 - 55 U/L Final  . Total Protein 12/15/2017 6.7  6.4 - 8.3 g/dL Final  . Albumin 12/15/2017 3.4* 3.5 - 5.0 g/dL Final  . Calcium 12/15/2017 9.5  8.4 - 10.4 mg/dL Final  . Anion Gap 12/15/2017 9  3 - 11 mEq/L Final  . EGFR 12/15/2017 >60  >60 ml/min/1.73 m2 Final   eGFR is calculated using the CKD-EPI Creatinine Equation (2009)    (this displays the last labs from the last 3 days)  No results found for: TOTALPROTELP,  ALBUMINELP, A1GS, A2GS, BETS, BETA2SER, GAMS, MSPIKE, SPEI (this displays SPEP labs)  No results found for: KPAFRELGTCHN, LAMBDASER, KAPLAMBRATIO (kappa/lambda light chains)  No results found for: HGBA, HGBA2QUANT, HGBFQUANT, HGBSQUAN (Hemoglobinopathy evaluation)   No results found for: LDH  No results found for: IRON, TIBC, IRONPCTSAT (Iron and TIBC)  No results found for: FERRITIN  Urinalysis    Component Value Date/Time   LABSPEC 1.020 02/10/2011 2058   PHURINE 6.0 02/10/2011 2058   HGBUR LARGE (A) 02/10/2011 2058   BILIRUBINUR MODERATE (A) 02/10/2011 2058   KETONESUR 15 (A) 02/10/2011 2058   PROTEINUR >=300 (A) 02/10/2011 2058   UROBILINOGEN >=8.0 02/10/2011 2058   NITRITE NEGATIVE 02/10/2011 2058   LEUKOCYTESUR  02/10/2011 2058    NEGATIVE Biochemical Testing Only. Please order routine urinalysis from main lab if confirmatory testing is needed.     STUDIES:  Nm Pet Image Initial (pi) Skull Base To Thigh  Result Date: 12/01/2017 CLINICAL DATA:  Initial treatment strategy for right breast cancer. EXAM: NUCLEAR  MEDICINE PET SKULL BASE TO THIGH TECHNIQUE: 8.2 mCi F-18 FDG was injected intravenously. Full-ring PET imaging was performed from the skull base to thigh after the radiotracer. CT data was obtained and used for attenuation correction and anatomic localization. FASTING BLOOD GLUCOSE:  Value: 86 mg/dl COMPARISON:  CT exams from 10/13/2017 and 09/24/2017 FINDINGS: NECK No hypermetabolic lymph nodes in the neck. Accentuated activity in the hard palate/anterior tongue region without any CT correlate and without disruption of tissue planes, likely physiologic. Accentuated activity in the right masseter muscle, considered physiologic. Atherosclerotic calcification of the common carotid arteries. Mild chronic right maxillary sinusitis. CHEST At the lumpectomy site there is only vague low level activity, maximum SUV 1.6. A crescentic right axillary lymph node with parenchymal portion measuring about 5 mm in thickness, and with a large fatty hilum, has maximum standard uptake value of 2.3. Similar appearing contralateral left-sided lymph node within even thinner parenchymal component has a maximum SUV of 1.5. Background mediastinal blood pool activity SUV: 2.6. Right Port-A-Cath tip: SVC. Coronary, aortic arch, and branch vessel atherosclerotic vascular disease. Mild cardiomegaly. Centrilobular emphysema. No significant pulmonary nodule is identified. Subsegmental atelectasis or scarring in both lower lobes. ABDOMEN/PELVIS No abnormal hypermetabolic activity within the liver, pancreas, adrenal glands, or spleen. Low-density fullness of both adrenal glands suspicious for adrenal adenomas. Aortoiliac atherosclerotic vascular disease. No abnormal hypermetabolic leak activity along the left adnexal lesion. No hypermetabolic lymph nodes in the abdomen or pelvis. SKELETON Interval kyphoplasty at the T8 with methacrylate occupying the space of the prior biopsy proven lytic metastatic lesion. Surprisingly little in the way of  residual metabolic activity at this level with maximum SUV of only 4.0. No other osseous metastatic involvement identified. IMPRESSION: 1. Interval resection of the right lateral breast mass and adjacent enlarged lymph node. Interval kyphoplasty of the T8 vertebral lesion. There is some low-grade activity at the site of the prior kyphoplasty with maximum SUV 4.0, but otherwise no residual significant hypermetabolic activity to suggest malignancy is identified in the neck, chest, abdomen/pelvis, or visualized skeleton. 2. Small and thin bilateral axillary lymph nodes are below background mediastinal blood pool activity and most likely benign. 3. Other imaging findings of potential clinical significance: Aortic Atherosclerosis (ICD10-I70.0) and Emphysema (ICD10-J43.9). Mild cardiomegaly. Suspected small bilateral adrenal adenomas. Mild chronic right maxillary sinusitis. Electronically Signed   By: Van Clines M.D.   On: 12/01/2017 08:27   Ir Radiologist Eval & Mgmt  Result Date: 11/26/2017 Please refer to notes  tab for details about interventional procedure. (Op Note)    ELIGIBLE FOR AVAILABLE RESEARCH PROTOCOL: no  ASSESSMENT: 66 y.o. [place] woman status post right breast upper outer quadrant and right axillary lymph node biopsy 09/03/2017, both positive for invasive ductal carcinoma, grade 3, estrogen receptor positive, progesterone receptor negative, with an MIB-1 of 90%, the lymph node being HER-2 positive, the breast mass HER-2 negative  (a) staging studies September 24, 2017 show a lytic lesion in T8, possible areas of concern at L1-L2  (b) osteochondral/biopsy procedure of T8 lesion scheduled for November 02, 2017  (1) neoadjuvant chemotherapy with carboplatin, docetaxel, trastuzumab, and pertuzumab started 10/01/2017  (2) trastuzumab to continue to complete 12 months  (3) definitive surgery to follow chemotherapy  (4) adjuvant radiation to follow  (5) anti-estrogens to start at  the completion of local treatment  PLAN:  Tracie [name] is doing moderately well today.  I reviewed her PET scan results with her today and the fact that there was only disease in T8. Due to her issues with the Docetaxel the neuropathy, and the skin peeling, she will not receive any further treatment with Docetaxel.  We attempted to switch her to Gemcitabine, Carboplatin today and per Tracie [name] in chemo authorizations, her insurance requires a 2 day notification period for this.  We therefore have requested she return next Tuesday for treatment.  She will proceed with Trastuzumab/Pertuzumab today however.   I will see Tracie [name] back on Tuesday, 12/08/17.    Tracie [name] knows to call for any questions or concerns prior to her next appointment with [place].  She verbalizes understanding of the above plan.     Wilber Bihari, NP  12/15/17 10:10 AM Medical Oncology and Hematology [facility] [address] [place], [place] [address] Tel. [phone]    Fax. [phone]   ADDENDUM: Tracie [name] is experiencing some of the expected side effects of chemo including loosening of nails pain of the nailbeds and probably loss of the nail or 2.  She understands that all that is reversible and she will have normal nails eventually.  I am more concerned about the fact that she is experiencing numbness and even a little bit of pain in her finger pads and toe pads.  This is neuropathy, and that can be irreversible.  Accordingly we are not going to give her her chemo today.  We are going to switch from Taxotere to gemcitabine to complete her final 3 cycles.  She will receive this at 800 mg/m on day 1 only.  This of course is not associated with neuropathy and so I expect that problem to improve over the next several weeks.  She will have the anti-HER-2 treatments today.  The carboplatin and gemcitabine will be given early next week, and then again with her next cycle of anti-HER-2 treatments.  Tracie [name] is a good  understanding of this change and agrees with it.   I personally saw this patient and performed a substantive portion of this encounter with the listed APP documented above.   Chauncey Cruel, MD Medical Oncology and Hematology [facility] [address] [place], [place] [address] Tel. [phone]    Fax. [phone]

## 2017-12-15 NOTE — Patient Instructions (Addendum)
[place] Discharge Instructions for Patients Receiving Chemotherapy  Today you received the following chemotherapy agents: Carboplatin (Paraplatin) and Gemcitabine (Gemzar).  To help prevent nausea and vomiting after your treatment, we encourage you to take your nausea medication as prescribed.  If you develop nausea and vomiting that is not controlled by your nausea medication, call the clinic.   BELOW ARE SYMPTOMS THAT SHOULD BE REPORTED IMMEDIATELY:  *FEVER GREATER THAN 100.5 F  *CHILLS WITH OR WITHOUT FEVER  NAUSEA AND VOMITING THAT IS NOT CONTROLLED WITH YOUR NAUSEA MEDICATION  *UNUSUAL SHORTNESS OF BREATH  *UNUSUAL BRUISING OR BLEEDING  TENDERNESS IN MOUTH AND THROAT WITH OR WITHOUT PRESENCE OF ULCERS  *URINARY PROBLEMS  *BOWEL PROBLEMS  UNUSUAL RASH Items with * indicate a potential emergency and should be followed up as soon as possible.  Feel free to call the clinic should you have any questions or concerns. The clinic phone number is [phone].  Please show the [place] at check-in to the Emergency Department and triage nurse.  Gemcitabine injection What is this medicine? GEMCITABINE (jem SIT a been) is a chemotherapy drug. This medicine is used to treat many types of cancer like breast cancer, lung cancer, pancreatic cancer, and ovarian cancer. This medicine may be used for other purposes; ask your health care provider or pharmacist if you have questions. COMMON BRAND NAME(S): Gemzar What should I tell my health care provider before I take this medicine? They need to know if you have any of these conditions: -blood disorders -infection -kidney disease -liver disease -recent or ongoing radiation therapy -an unusual or allergic reaction to gemcitabine, other chemotherapy, other medicines, foods, dyes, or preservatives -pregnant or trying to get pregnant -breast-feeding How should I use this medicine? This drug is given as an  infusion into a vein. It is administered in a hospital or clinic by a specially trained health care professional. Talk to your pediatrician regarding the use of this medicine in children. Special care may be needed. Overdosage: If you think you have taken too much of this medicine contact a poison control center or emergency room at once. NOTE: This medicine is only for you. Do not share this medicine with others. What if I miss a dose? It is important not to miss your dose. Call your doctor or health care professional if you are unable to keep an appointment. What may interact with this medicine? -medicines to increase blood counts like filgrastim, pegfilgrastim, sargramostim -some other chemotherapy drugs like cisplatin -vaccines Talk to your doctor or health care professional before taking any of these medicines: -acetaminophen -aspirin -ibuprofen -ketoprofen -naproxen This list may not describe all possible interactions. Give your health care provider a list of all the medicines, herbs, non-prescription drugs, or dietary supplements you use. Also tell them if you smoke, drink alcohol, or use illegal drugs. Some items may interact with your medicine. What should I watch for while using this medicine? Visit your doctor for checks on your progress. This drug may make you feel generally unwell. This is not uncommon, as chemotherapy can affect healthy cells as well as cancer cells. Report any side effects. Continue your course of treatment even though you feel ill unless your doctor tells you to stop. In some cases, you may be given additional medicines to help with side effects. Follow all directions for their use. Call your doctor or health care professional for advice if you get a fever, chills or sore throat, or other symptoms of  a cold or flu. Do not treat yourself. This drug decreases your body's ability to fight infections. Try to avoid being around people who are sick. This medicine may  increase your risk to bruise or bleed. Call your doctor or health care professional if you notice any unusual bleeding. Be careful brushing and flossing your teeth or using a toothpick because you may get an infection or bleed more easily. If you have any dental work done, tell your dentist you are receiving this medicine. Avoid taking products that contain aspirin, acetaminophen, ibuprofen, naproxen, or ketoprofen unless instructed by your doctor. These medicines may hide a fever. Women should inform their doctor if they wish to become pregnant or think they might be pregnant. There is a potential for serious side effects to an unborn child. Talk to your health care professional or pharmacist for more information. Do not breast-feed an infant while taking this medicine. What side effects may I notice from receiving this medicine? Side effects that you should report to your doctor or health care professional as soon as possible: -allergic reactions like skin rash, itching or hives, swelling of the face, lips, or tongue -low blood counts - this medicine may decrease the number of white blood cells, red blood cells and platelets. You may be at increased risk for infections and bleeding. -signs of infection - fever or chills, cough, sore throat, pain or difficulty passing urine -signs of decreased platelets or bleeding - bruising, pinpoint red spots on the skin, black, tarry stools, blood in the urine -signs of decreased red blood cells - unusually weak or tired, fainting spells, lightheadedness -breathing problems -chest pain -mouth sores -nausea and vomiting -pain, swelling, redness at site where injected -pain, tingling, numbness in the hands or feet -stomach pain -swelling of ankles, feet, hands -unusual bleeding Side effects that usually do not require medical attention (report to your doctor or health care professional if they continue or are bothersome): -constipation -diarrhea -hair  loss -loss of appetite -stomach upset This list may not describe all possible side effects. Call your doctor for medical advice about side effects. You may report side effects to FDA at 1-800-FDA-1088. Where should I keep my medicine? This drug is given in a hospital or clinic and will not be stored at home. NOTE: This sheet is a summary. It may not cover all possible information. If you have questions about this medicine, talk to your doctor, pharmacist, or health care provider.  2018 [employer] (2008-04-25 18:45:54)

## 2017-12-17 ENCOUNTER — Telehealth: Payer: Self-pay | Admitting: Oncology

## 2017-12-17 NOTE — Telephone Encounter (Signed)
Scheduled appt per 12/18 sch message - patient is aware of apt added and will pick up a new schedule next visit.

## 2017-12-24 ENCOUNTER — Ambulatory Visit (HOSPITAL_BASED_OUTPATIENT_CLINIC_OR_DEPARTMENT_OTHER): Payer: 59 | Admitting: Adult Health

## 2017-12-24 ENCOUNTER — Ambulatory Visit: Payer: 59

## 2017-12-24 ENCOUNTER — Other Ambulatory Visit (HOSPITAL_BASED_OUTPATIENT_CLINIC_OR_DEPARTMENT_OTHER): Payer: 59

## 2017-12-24 ENCOUNTER — Telehealth: Payer: Self-pay | Admitting: Adult Health

## 2017-12-24 ENCOUNTER — Other Ambulatory Visit: Payer: 59

## 2017-12-24 ENCOUNTER — Encounter: Payer: Self-pay | Admitting: Adult Health

## 2017-12-24 ENCOUNTER — Ambulatory Visit (HOSPITAL_BASED_OUTPATIENT_CLINIC_OR_DEPARTMENT_OTHER): Payer: 59

## 2017-12-24 VITALS — BP 131/68 | HR 85 | Temp 98.4°F | Resp 18

## 2017-12-24 DIAGNOSIS — C773 Secondary and unspecified malignant neoplasm of axilla and upper limb lymph nodes: Secondary | ICD-10-CM | POA: Diagnosis not present

## 2017-12-24 DIAGNOSIS — C50411 Malignant neoplasm of upper-outer quadrant of right female breast: Secondary | ICD-10-CM

## 2017-12-24 DIAGNOSIS — C7951 Secondary malignant neoplasm of bone: Secondary | ICD-10-CM | POA: Diagnosis not present

## 2017-12-24 DIAGNOSIS — Z170 Estrogen receptor positive status [ER+]: Principal | ICD-10-CM

## 2017-12-24 DIAGNOSIS — G620 Drug-induced polyneuropathy: Secondary | ICD-10-CM | POA: Diagnosis not present

## 2017-12-24 DIAGNOSIS — Z5112 Encounter for antineoplastic immunotherapy: Secondary | ICD-10-CM

## 2017-12-24 LAB — CBC WITH DIFFERENTIAL/PLATELET
BASO%: 0.3 % (ref 0.0–2.0)
Basophils Absolute: 0 10*3/uL (ref 0.0–0.1)
EOS ABS: 0.1 10*3/uL (ref 0.0–0.5)
EOS%: 2.2 % (ref 0.0–7.0)
HCT: 27.9 % — ABNORMAL LOW (ref 34.8–46.6)
HGB: 8.9 g/dL — ABNORMAL LOW (ref 11.6–15.9)
LYMPH%: 51.9 % — AB (ref 14.0–49.7)
MCH: 30.1 pg (ref 25.1–34.0)
MCHC: 31.9 g/dL (ref 31.5–36.0)
MCV: 94.3 fL (ref 79.5–101.0)
MONO#: 0.2 10*3/uL (ref 0.1–0.9)
MONO%: 5.2 % (ref 0.0–14.0)
NEUT%: 40.4 % (ref 38.4–76.8)
NEUTROS ABS: 1.3 10*3/uL — AB (ref 1.5–6.5)
Platelets: 140 10*3/uL — ABNORMAL LOW (ref 145–400)
RBC: 2.96 10*6/uL — AB (ref 3.70–5.45)
RDW: 19.8 % — ABNORMAL HIGH (ref 11.2–14.5)
WBC: 3.2 10*3/uL — AB (ref 3.9–10.3)
lymph#: 1.7 10*3/uL (ref 0.9–3.3)

## 2017-12-24 LAB — COMPREHENSIVE METABOLIC PANEL
ALBUMIN: 3.4 g/dL — AB (ref 3.5–5.0)
ALK PHOS: 93 U/L (ref 40–150)
ALT: 18 U/L (ref 0–55)
AST: 15 U/L (ref 5–34)
Anion Gap: 9 mEq/L (ref 3–11)
BUN: 7.9 mg/dL (ref 7.0–26.0)
CALCIUM: 9.2 mg/dL (ref 8.4–10.4)
CO2: 25 mEq/L (ref 22–29)
CREATININE: 0.7 mg/dL (ref 0.6–1.1)
Chloride: 104 mEq/L (ref 98–109)
EGFR: >60 mL/min/{1.73_m2} (ref >60)
Glucose: 99 mg/dl (ref 70–140)
Potassium: 3.3 mEq/L — ABNORMAL LOW (ref 3.5–5.1)
Sodium: 138 mEq/L (ref 136–145)
Total Bilirubin: <0.22 mg/dL (ref 0.20–1.20)
Total Protein: 6.3 g/dL — ABNORMAL LOW (ref 6.4–8.3)

## 2017-12-24 MED ORDER — DIPHENHYDRAMINE HCL 25 MG PO CAPS
25.0000 mg | ORAL_CAPSULE | Freq: Once | ORAL | Status: AC
  Administered 2017-12-24: 25 mg via ORAL

## 2017-12-24 MED ORDER — ONDANSETRON HCL 8 MG PO TABS: 8.0000 mg | ORAL_TABLET | Freq: Three times a day (TID) | ORAL | 0 refills | Status: DC | PRN

## 2017-12-24 MED ORDER — DEXAMETHASONE 4 MG PO TABS: 8.0000 mg | ORAL_TABLET | Freq: Two times a day (BID) | ORAL | 1 refills | Status: DC

## 2017-12-24 MED ORDER — SODIUM CHLORIDE 0.9 % IV SOLN
Freq: Once | INTRAVENOUS | Status: AC
  Administered 2017-12-24: 10:00:00 via INTRAVENOUS

## 2017-12-24 MED ORDER — DIPHENHYDRAMINE HCL 25 MG PO CAPS
ORAL_CAPSULE | ORAL | Status: AC
  Filled 2017-12-24: qty 1

## 2017-12-24 MED ORDER — TRASTUZUMAB CHEMO 150 MG IV SOLR
300.0000 mg | Freq: Once | INTRAVENOUS | Status: AC
  Administered 2017-12-24: 300 mg via INTRAVENOUS
  Filled 2017-12-24: qty 14.29

## 2017-12-24 MED ORDER — SODIUM CHLORIDE 0.9% FLUSH
10.0000 mL | INTRAVENOUS | Status: DC | PRN
  Administered 2017-12-24: 10 mL
  Filled 2017-12-24: qty 10

## 2017-12-24 MED ORDER — SODIUM CHLORIDE 0.9 % IV SOLN
420.0000 mg | Freq: Once | INTRAVENOUS | Status: AC
  Administered 2017-12-24: 420 mg via INTRAVENOUS
  Filled 2017-12-24: qty 14

## 2017-12-24 MED ORDER — HEPARIN SOD (PORK) LOCK FLUSH 100 UNIT/ML IV SOLN
500.0000 [IU] | Freq: Once | INTRAVENOUS | Status: AC | PRN
  Administered 2017-12-24: 500 [IU]
  Filled 2017-12-24: qty 5

## 2017-12-24 MED ORDER — PROCHLORPERAZINE MALEATE 10 MG PO TABS: 10.0000 mg | ORAL_TABLET | Freq: Four times a day (QID) | ORAL | 1 refills | Status: DC | PRN

## 2017-12-24 MED ORDER — SODIUM CHLORIDE 0.9 % IV SOLN
INTRAVENOUS | Status: DC
Start: 2017-12-24 — End: 2017-12-24
  Administered 2017-12-24: 11:00:00 via INTRAVENOUS

## 2017-12-24 MED ORDER — ACETAMINOPHEN 325 MG PO TABS
650.0000 mg | ORAL_TABLET | Freq: Once | ORAL | Status: AC
  Administered 2017-12-24: 650 mg via ORAL

## 2017-12-24 MED ORDER — ACETAMINOPHEN 325 MG PO TABS
ORAL_TABLET | ORAL | Status: AC
  Filled 2017-12-24: qty 2

## 2017-12-24 MED ORDER — SODIUM CHLORIDE 0.9% FLUSH
10.0000 mL | Freq: Once | INTRAVENOUS | Status: AC
  Administered 2017-12-24: 10 mL
  Filled 2017-12-24: qty 10

## 2017-12-24 NOTE — Patient Instructions (Signed)
[place] Discharge Instructions for Patients Receiving Chemotherapy  Today you received the following chemotherapy agents Herceptin and perjeta  To help prevent nausea and vomiting after your treatment, we encourage you to take your nausea medication as directed   If you develop nausea and vomiting that is not controlled by your nausea medication, call the clinic.   BELOW ARE SYMPTOMS THAT SHOULD BE REPORTED IMMEDIATELY:  *FEVER GREATER THAN 100.5 F  *CHILLS WITH OR WITHOUT FEVER  NAUSEA AND VOMITING THAT IS NOT CONTROLLED WITH YOUR NAUSEA MEDICATION  *UNUSUAL SHORTNESS OF BREATH  *UNUSUAL BRUISING OR BLEEDING  TENDERNESS IN MOUTH AND THROAT WITH OR WITHOUT PRESENCE OF ULCERS  *URINARY PROBLEMS  *BOWEL PROBLEMS  UNUSUAL RASH Items with * indicate a potential emergency and should be followed up as soon as possible.  Feel free to call the clinic should you have any questions or concerns. The clinic phone number is [phone].  Please show the [place] at check-in to the Emergency Department and triage nurse.

## 2017-12-24 NOTE — Telephone Encounter (Signed)
No 12/27 los.  

## 2017-12-24 NOTE — Progress Notes (Signed)
[facility]  [phone] [phone]     ID: Tracie [name] DOB: 03/15/1951  MR#: [id number]  CSN#:[id number]  Patient Care Team: Boyd, Tammy Lamonica, MD as PCP - General (Family Medicine) Ingram, Haywood, MD as Consulting Physician (General Surgery) Magrinat, Gustav C, MD as Consulting Physician (Oncology) Squire, Sarah, MD as Attending Physician (Radiation Oncology) OTHER MD:  CHIEF COMPLAINT: Triple positive breast cancer  CURRENT TREATMENT: Neoadjuvant chemotherapy/immunotherapy.   HISTORY OF CURRENT ILLNESS: From the original intake note:  Phyllis noted a change in her right breast sometime in July 2018. [facility] to report that she had found a "kernel" in her breast. She was advised to see her primary physician which she did. The patient was then set up for bilateral diagnostic mammography with tomography and right breast ultrasonography at [facility] 09/03/2017. The breast density was category I a. In the upper outer quadrant of the right breast there was a 2.1 cm high density mass, which was palpable. Also in the right breast more posteriorly there was a 1.5 cm high density mass which was felt to be a suspicious lymph node. Ultrasound of the right breast confirmed a 2.1 centimeter lobulated solid mass in the right breast upper outer quadrant 15.8 cm from the nipple in the 10:00 radiant. There was a 1.5 cm oval mass in the right axillary tail with a small rounded masses also suspicious for lymph node involvement. A total of 3 suspicious lymph nodes were identified.  On 09/03/2017 the patient underwent biopsy of the breast mass and the suspicious right axillary lymph node. The final pathology ([id number]) found invasive ductal carcinoma, grade 3, in both. Both tumors were estrogen receptor positive at 70-75%, and both were progesterone receptor negative. Both had an elevated proliferation marker at 90%. The mass in the breast was HER-2  negative, with a signals ratio of 1.25 and the number per cell 1.88. The lymph node mass however was HER-2 positive with a signals ratio of 2.57, and the number per cell 3.60.  The patient's subsequent history is as detailed below.  INTERVAL HISTORY: Tracie [name] returns today for follow-up and treatment of her triple positive breast cancer, which is being treated neo-adjuvantly. Today is day 1 cycle 5 of Trastuzumab/pertuzumab.  She started Gemcitabine/Carboplatin on 12/18 and tolerated this well.    REVIEW OF SYSTEMS: Kenita is doing moderately well today.  She is having increase in her belching and wants to know what to take for it. She has tried Pepto Bismol and tums.  She denies reflux, constipation, diarrhea, nausea.   She tolerated the change to Gemcitabine/Carbo without difficulty.  She continues to have a metallic taste, and was hoping that it would improve with the change, however the taste change has persisted.  Her neuropathy, though still present is improved.  She denies any other issues and a detailed ROS is otherwise non contributory.    PAST MEDICAL HISTORY: Past Medical History:  Diagnosis Date  . Angina pectoris (HCC)   . Asthma   . Cancer (HCC)    right breast  . Constipation   . Diverticulitis   . Headache   . HPV in female   . Hyperlipidemia   . Hypertension   . Hypothyroidism   . Wears dentures   . Wears glasses     PAST SURGICAL HISTORY: Past Surgical History:  Procedure Laterality Date  . CESAREAN SECTION    . COLON SURGERY  2012   sigmoidectomy  . COLOSTOMY    .   COLOSTOMY TAKEDOWN  10/06/11  . IR BONE TUMOR(S)RF ABLATION  11/02/2017  . IR KYPHO THORACIC WITH BONE BIOPSY  11/02/2017  . IR RADIOLOGIST EVAL & MGMT  10/07/2017  . IR RADIOLOGIST EVAL & MGMT  11/25/2017  . PORTACATH PLACEMENT N/A 09/25/2017   Procedure: INSERTION PORT-A-CATH WITH ULTRA SOUND ERAS PATHWAY;  Surgeon: Ingram, Haywood, MD;  Location: [facility] OR;  Service: General;  Laterality: N/A;  .  TONSILLECTOMY    . TUBAL LIGATION      FAMILY HISTORY Family History  Problem Relation Age of Onset  . Cancer Mother        rectal  . Hypertension Brother   . Hypertension Brother   . Asthma Son   . Asthma Brother    The patient has little information regarding her father. Her mother died at age 48 from a strange cancer--the patient does not know what it was, it may well have been cervical cancer from her description. The patient has one brother, no sisters. There is no history of breast or ovarian cancer in the family to the patient's knowledge   GYNECOLOGIC HISTORY:  No LMP recorded. Patient is postmenopausal.  menarche age 11, first live birth age 17 the patient is GX P3. She stopped having periods at age 53. She never used oral contraceptives or hormone replacement.   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 She is divorced. Currently her son Tracie [name] lives with her. He is a barber. The 2 other children are Tracie [name], who lives in [place] and works in the theater and media, and Tracie [name] lives in [place], and is vice president of a local Corporation. The patient has 5 grandchildren. She is a [employer].     ADVANCED DIRECTIVES: Not in place    HEALTH MAINTENANCE: Social History   Tobacco Use  . Smoking status: Current Some Day Smoker    Packs/day: 0.50    Years: 40.00    Pack years: 20.00    Types: Cigarettes  . Smokeless tobacco: Never Used  . Tobacco comment: Pt already has info  Substance Use Topics  . Alcohol use: Yes    Comment: occasional  . Drug use: No     Colonoscopy: September 2012   PAP:  Bone density:09/03/2017    Allergies  Allergen Reactions  . Fish Allergy Anaphylaxis and Swelling    Swelling of hands and feet.  . Peanut-Containing Drug Products Anaphylaxis    Tree nuts included in allergic reaction.  . Ace Inhibitors Other (See Comments) and Cough    Cold like symptoms     Current Outpatient  Medications  Medication Sig Dispense Refill  . albuterol (VENTOLIN HFA) 108 (90 BASE) MCG/ACT inhaler Inhale 1-2 puffs into the lungs every 6 (six) hours as needed for wheezing or shortness of breath. 1 Inhaler 6  . Alirocumab (PRALUENT) 75 MG/ML SOPN Inject 75 mg into the muscle every 14 (fourteen) days.     . amLODipine (NORVASC) 10 MG tablet Take 10 mg by mouth daily.     . cetirizine (ZYRTEC) 10 MG tablet   11  . dexamethasone (DECADRON) 4 MG tablet Take 2 tablets (8 mg total) by mouth 2 (two) times daily. Start the day before Taxotere. Then again the day after chemo for 3 days. 30 tablet 1  . diphenoxylate-atropine (LOMOTIL) 2.5-0.025 MG tablet Take 1 tablet by mouth 4 (four) times daily as needed for diarrhea or loose stools. 60 tablet 0  .   levothyroxine (SYNTHROID, LEVOTHROID) 50 MCG tablet Take 50 mcg by mouth daily.    . lidocaine-prilocaine (EMLA) cream Apply to affected area once 30 g 3  . loratadine (CLARITIN) 10 MG tablet Take 1 tablet (10 mg total) by mouth daily. 20 tablet 3  . LORazepam (ATIVAN) 0.5 MG tablet Take 1 tablet (0.5 mg total) by mouth at bedtime as needed (Nausea or vomiting). 30 tablet 0  . losartan-hydrochlorothiazide (HYZAAR) 100-12.5 MG tablet Take 1 tablet by mouth daily.   3  . magic mouthwash SOLN Take 5 mLs 4 (four) times daily as needed by mouth for mouth pain. 240 mL 0  . metoprolol succinate (TOPROL-XL) 50 MG 24 hr tablet Take 50 mg by mouth daily.   6  . mometasone-formoterol (DULERA) 100-5 MCG/ACT AERO Inhale 2 puffs into the lungs 2 (two) times daily. 3 Inhaler 0  . Multiple Vitamins-Minerals (ICAPS AREDS 2) CAPS Take 1 capsule by mouth 2 (two) times daily. 60 capsule 3  . nitroGLYCERIN (NITROSTAT) 0.4 MG SL tablet Place 0.4 mg under the tongue every 5 (five) minutes as needed for chest pain.    . ondansetron (ZOFRAN) 8 MG tablet Take 1 tablet (8 mg total) by mouth every 8 (eight) hours as needed for nausea or vomiting. 20 tablet 0  . potassium chloride  (K-DUR) 10 MEQ tablet Take 2 tablets (20 mEq total) daily by mouth. 60 tablet 0  . prochlorperazine (COMPAZINE) 10 MG tablet Take 1 tablet (10 mg total) by mouth every 6 (six) hours as needed (Nausea or vomiting). 30 tablet 1  . Skin Protectants, Misc. (EUCERIN) cream Apply topically as needed for dry skin. 454 g 0  . traMADol (ULTRAM) 50 MG tablet Take 1 tablet (50 mg total) by mouth every 8 (eight) hours as needed (pain). 30 tablet 0   No current facility-administered medications for this visit.     OBJECTIVE:   Vitals:   12/24/17 0827  BP: 130/77  Pulse: 92  Resp: 18  Temp: 98.6 F (37 C)  SpO2: 100%     Body mass index is 29.17 kg/m.   Wt Readings from Last 3 Encounters:  12/24/17 167 lb 4.8 oz (75.9 kg)  12/15/17 170 lb (77.1 kg)  12/03/17 172 lb 9.6 oz (78.3 kg)  ECOG FS:1 - Symptomatic but completely ambulatory GENERAL: Patient is a well appearing female in no acute distress HEENT:  Sclerae anicteric.  Oropharynx clear and moist.   No evidence of oropharyngeal candidiasis. Neck is supple.  NODES:  No cervical, supraclavicular, or axillary lymphadenopathy palpated.  BREAST EXAM:  Deferred. LUNGS:  Clear to auscultation bilaterally.  No wheezes or rhonchi. HEART:  Regular rate and rhythm. No murmur appreciated. ABDOMEN:  Soft, nontender.  Positive, normoactive bowel sounds. No organomegaly palpated. MSK:  No focal spinal tenderness to palpation. Full range of motion bilaterally in the upper extremities. EXTREMITIES:  No peripheral edema.   SKIN:  Skin on hands bilaterally is peeling, erythema has resolved. NEURO:  Nonfocal. Well oriented.  Appropriate affect.      LAB RESULTS:  CMP     Component Value Date/Time   NA 140 12/15/2017 0924   K 3.4 (L) 12/15/2017 0924   CL 93 (L) 11/02/2017 0755   CO2 28 12/15/2017 0924   GLUCOSE 128 12/15/2017 0924   BUN 11.7 12/15/2017 0924   CREATININE 0.9 12/15/2017 0924   CALCIUM 9.5 12/15/2017 0924   PROT 6.7 12/15/2017  0924   ALBUMIN 3.4 (L) 12/15/2017 0924     AST 12 12/15/2017 0924   ALT 13 12/15/2017 0924   ALKPHOS 100 12/15/2017 0924   BILITOT 0.29 12/15/2017 0924   GFRNONAA >60 11/02/2017 0755   GFRAA >60 11/02/2017 0755    No results found for: TOTALPROTELP, ALBUMINELP, A1GS, A2GS, BETS, BETA2SER, GAMS, MSPIKE, SPEI  No results found for: KPAFRELGTCHN, LAMBDASER, [facility]  Lab Results  Component Value Date   WBC 3.2 (L) 12/24/2017   NEUTROABS 1.3 (L) 12/24/2017   HGB 8.9 (L) 12/24/2017   HCT 27.9 (L) 12/24/2017   MCV 94.3 12/24/2017   PLT 140 (L) 12/24/2017      Chemistry      Component Value Date/Time   NA 140 12/15/2017 0924   K 3.4 (L) 12/15/2017 0924   CL 93 (L) 11/02/2017 0755   CO2 28 12/15/2017 0924   BUN 11.7 12/15/2017 0924   CREATININE 0.9 12/15/2017 0924      Component Value Date/Time   CALCIUM 9.5 12/15/2017 0924   ALKPHOS 100 12/15/2017 0924   AST 12 12/15/2017 0924   ALT 13 12/15/2017 0924   BILITOT 0.29 12/15/2017 0924       No results found for: LABCA2  No components found for: [id number]    Appointment on 12/24/2017  Component Date Value Ref Range Status  . WBC 12/24/2017 3.2* 3.9 - 10.3 10e3/uL Final  . NEUT# 12/24/2017 1.3* 1.5 - 6.5 10e3/uL Final  . HGB 12/24/2017 8.9* 11.6 - 15.9 g/dL Final  . HCT 12/24/2017 27.9* 34.8 - 46.6 % Final  . Platelets 12/24/2017 140* 145 - 400 10e3/uL Final  . MCV 12/24/2017 94.3  79.5 - 101.0 fL Final  . MCH 12/24/2017 30.1  25.1 - 34.0 pg Final  . MCHC 12/24/2017 31.9  31.5 - 36.0 g/dL Final  . RBC 12/24/2017 2.96* 3.70 - 5.45 10e6/uL Final  . RDW 12/24/2017 19.8* 11.2 - 14.5 % Final  . lymph# 12/24/2017 1.7  0.9 - 3.3 10e3/uL Final  . MONO# 12/24/2017 0.2  0.1 - 0.9 10e3/uL Final  . Eosinophils Absolute 12/24/2017 0.1  0.0 - 0.5 10e3/uL Final  . Basophils Absolute 12/24/2017 0.0  0.0 - 0.1 10e3/uL Final  . NEUT% 12/24/2017 40.4  38.4 - 76.8 % Final  . LYMPH% 12/24/2017 51.9* 14.0 - 49.7 % Final  . MONO%  12/24/2017 5.2  0.0 - 14.0 % Final  . EOS% 12/24/2017 2.2  0.0 - 7.0 % Final  . BASO% 12/24/2017 0.3  0.0 - 2.0 % Final    (this displays the last labs from the last 3 days)  No results found for: TOTALPROTELP, ALBUMINELP, A1GS, A2GS, BETS, BETA2SER, GAMS, MSPIKE, SPEI (this displays SPEP labs)  No results found for: KPAFRELGTCHN, LAMBDASER, KAPLAMBRATIO (kappa/lambda light chains)  No results found for: HGBA, HGBA2QUANT, HGBFQUANT, HGBSQUAN (Hemoglobinopathy evaluation)   No results found for: LDH  No results found for: IRON, TIBC, IRONPCTSAT (Iron and TIBC)  No results found for: FERRITIN  Urinalysis    Component Value Date/Time   LABSPEC 1.020 02/10/2011 2058   PHURINE 6.0 02/10/2011 2058   HGBUR LARGE (A) 02/10/2011 2058   BILIRUBINUR MODERATE (A) 02/10/2011 2058   KETONESUR 15 (A) 02/10/2011 2058   PROTEINUR >=300 (A) 02/10/2011 2058   UROBILINOGEN >=8.0 02/10/2011 2058   NITRITE NEGATIVE 02/10/2011 2058   LEUKOCYTESUR  02/10/2011 2058    NEGATIVE Biochemical Testing Only. Please order routine urinalysis from main lab if confirmatory testing is needed.     STUDIES:  Nm Pet Image Initial (pi) Skull Base To  Thigh  Result Date: 12/01/2017 CLINICAL DATA:  Initial treatment strategy for right breast cancer. EXAM: NUCLEAR MEDICINE PET SKULL BASE TO THIGH TECHNIQUE: 8.2 mCi F-18 FDG was injected intravenously. Full-ring PET imaging was performed from the skull base to thigh after the radiotracer. CT data was obtained and used for attenuation correction and anatomic localization. FASTING BLOOD GLUCOSE:  Value: 86 mg/dl COMPARISON:  CT exams from 10/13/2017 and 09/24/2017 FINDINGS: NECK No hypermetabolic lymph nodes in the neck. Accentuated activity in the hard palate/anterior tongue region without any CT correlate and without disruption of tissue planes, likely physiologic. Accentuated activity in the right masseter muscle, considered physiologic. Atherosclerotic calcification  of the common carotid arteries. Mild chronic right maxillary sinusitis. CHEST At the lumpectomy site there is only vague low level activity, maximum SUV 1.6. A crescentic right axillary lymph node with parenchymal portion measuring about 5 mm in thickness, and with a large fatty hilum, has maximum standard uptake value of 2.3. Similar appearing contralateral left-sided lymph node within even thinner parenchymal component has a maximum SUV of 1.5. Background mediastinal blood pool activity SUV: 2.6. Right Port-A-Cath tip: SVC. Coronary, aortic arch, and branch vessel atherosclerotic vascular disease. Mild cardiomegaly. Centrilobular emphysema. No significant pulmonary nodule is identified. Subsegmental atelectasis or scarring in both lower lobes. ABDOMEN/PELVIS No abnormal hypermetabolic activity within the liver, pancreas, adrenal glands, or spleen. Low-density fullness of both adrenal glands suspicious for adrenal adenomas. Aortoiliac atherosclerotic vascular disease. No abnormal hypermetabolic leak activity along the left adnexal lesion. No hypermetabolic lymph nodes in the abdomen or pelvis. SKELETON Interval kyphoplasty at the T8 with methacrylate occupying the space of the prior biopsy proven lytic metastatic lesion. Surprisingly little in the way of residual metabolic activity at this level with maximum SUV of only 4.0. No other osseous metastatic involvement identified. IMPRESSION: 1. Interval resection of the right lateral breast mass and adjacent enlarged lymph node. Interval kyphoplasty of the T8 vertebral lesion. There is some low-grade activity at the site of the prior kyphoplasty with maximum SUV 4.0, but otherwise no residual significant hypermetabolic activity to suggest malignancy is identified in the neck, chest, abdomen/pelvis, or visualized skeleton. 2. Small and thin bilateral axillary lymph nodes are below background mediastinal blood pool activity and most likely benign. 3. Other imaging  findings of potential clinical significance: Aortic Atherosclerosis (ICD10-I70.0) and Emphysema (ICD10-J43.9). Mild cardiomegaly. Suspected small bilateral adrenal adenomas. Mild chronic right maxillary sinusitis. Electronically Signed   By: Van Clines M.D.   On: 12/01/2017 08:27   Ir Radiologist Eval & Mgmt  Result Date: 11/26/2017 Please refer to notes tab for details about interventional procedure. (Op Note)    ELIGIBLE FOR AVAILABLE RESEARCH PROTOCOL: no  ASSESSMENT: 66 y.o. [place] woman status post right breast upper outer quadrant and right axillary lymph node biopsy 09/03/2017, both positive for invasive ductal carcinoma, grade 3, estrogen receptor positive, progesterone receptor negative, with an MIB-1 of 90%, the lymph node being HER-2 positive, the breast mass HER-2 negative  (a) staging studies September 24, 2017 show a lytic lesion in T8, possible areas of concern at L1-L2  (b) osteochondral/biopsy procedure of T8 lesion scheduled for November 02, 2017  (1) neoadjuvant chemotherapy with carboplatin, docetaxel, trastuzumab, and pertuzumab started 10/01/2017, Docetaxel dropped after three cycles and Gemcitabine/Carbo started on 12/15/2017.   (2) trastuzumab to continue to complete 12 months  (3) definitive surgery to follow chemotherapy  (4) adjuvant radiation to follow  (5) anti-estrogens to start at the completion of local treatment  PLAN: Tracie [name]  is doing well today.  Her labs are stable.  She will proceed with Trastuzumab/Pertuzumab today.  Her last echo in November was normal.  She tolerated Gemcitabine/Carboplatin well.  She brought her pet scan in that I gave her a visit or two ago with it highlighted with questions.  I answered her questions and reviewed her pet scan with her in detail.  She and I also reviewed her treatment schedule, since it is slightly discordant between Trastuzumab/Pertuzumab and the Gemcitabine/Carbo due to delays with insurance approval  and the snow.  I will review this with Dr. Jana Hakim and get back to her.    Luanna knows to call for any questions or concerns prior to her next appointment with [place].  She verbalizes understanding of the above plan.    A total of (30) minutes of face-to-face time was spent with this patient with greater than 50% of that time in counseling and care-coordination.   Wilber Bihari, NP  12/24/17 8:31 AM Medical Oncology and Hematology [facility] [address] [place], [place] [address] Tel. [phone]    Fax. [phone]

## 2018-01-05 ENCOUNTER — Telehealth: Payer: Self-pay

## 2018-01-05 ENCOUNTER — Inpatient Hospital Stay: Payer: 59

## 2018-01-05 ENCOUNTER — Inpatient Hospital Stay: Payer: 59 | Attending: Oncology

## 2018-01-05 ENCOUNTER — Encounter: Payer: Self-pay | Admitting: Adult Health

## 2018-01-05 ENCOUNTER — Inpatient Hospital Stay (HOSPITAL_BASED_OUTPATIENT_CLINIC_OR_DEPARTMENT_OTHER): Payer: 59 | Admitting: Adult Health

## 2018-01-05 VITALS — BP 114/76 | HR 75 | Temp 98.1°F | Resp 12 | Ht 63.5 in | Wt 165.0 lb

## 2018-01-05 DIAGNOSIS — F17200 Nicotine dependence, unspecified, uncomplicated: Secondary | ICD-10-CM | POA: Insufficient documentation

## 2018-01-05 DIAGNOSIS — C50411 Malignant neoplasm of upper-outer quadrant of right female breast: Secondary | ICD-10-CM | POA: Insufficient documentation

## 2018-01-05 DIAGNOSIS — J320 Chronic maxillary sinusitis: Secondary | ICD-10-CM | POA: Insufficient documentation

## 2018-01-05 DIAGNOSIS — C7951 Secondary malignant neoplasm of bone: Secondary | ICD-10-CM

## 2018-01-05 DIAGNOSIS — Z170 Estrogen receptor positive status [ER+]: Principal | ICD-10-CM

## 2018-01-05 DIAGNOSIS — C773 Secondary and unspecified malignant neoplasm of axilla and upper limb lymph nodes: Secondary | ICD-10-CM

## 2018-01-05 DIAGNOSIS — Z5111 Encounter for antineoplastic chemotherapy: Secondary | ICD-10-CM | POA: Insufficient documentation

## 2018-01-05 DIAGNOSIS — I10 Essential (primary) hypertension: Secondary | ICD-10-CM | POA: Diagnosis not present

## 2018-01-05 DIAGNOSIS — Z5112 Encounter for antineoplastic immunotherapy: Secondary | ICD-10-CM | POA: Insufficient documentation

## 2018-01-05 LAB — CBC WITH DIFFERENTIAL/PLATELET
Abs Granulocyte: 1.7 10*3/uL (ref 1.5–6.5)
Basophils Absolute: 0 10*3/uL (ref 0.0–0.1)
Basophils Relative: 0 %
EOS ABS: 0.1 10*3/uL (ref 0.0–0.5)
EOS PCT: 2 %
HCT: 32 % — ABNORMAL LOW (ref 34.8–46.6)
Hemoglobin: 10.3 g/dL — ABNORMAL LOW (ref 11.6–15.9)
LYMPHS ABS: 2.1 10*3/uL (ref 0.9–3.3)
Lymphocytes Relative: 46 %
MCH: 30.7 pg (ref 25.1–34.0)
MCHC: 32.2 g/dL (ref 31.5–36.0)
MCV: 95.5 fL (ref 79.5–101.0)
MONOS PCT: 13 %
Monocytes Absolute: 0.6 10*3/uL (ref 0.1–0.9)
Neutro Abs: 1.7 10*3/uL (ref 1.5–6.5)
Neutrophils Relative %: 39 %
PLATELETS: 307 10*3/uL (ref 145–400)
RBC: 3.35 MIL/uL — ABNORMAL LOW (ref 3.70–5.45)
RDW: 21 % — ABNORMAL HIGH (ref 11.2–16.1)
WBC: 4.5 10*3/uL (ref 3.9–10.3)

## 2018-01-05 LAB — COMPREHENSIVE METABOLIC PANEL
ALBUMIN: 3.5 g/dL (ref 3.5–5.0)
ALK PHOS: 100 U/L (ref 40–150)
ALT: 16 U/L (ref 0–55)
AST: 13 U/L (ref 5–34)
Anion gap: 8 (ref 3–11)
BUN: 10 mg/dL (ref 7–26)
CALCIUM: 9.8 mg/dL (ref 8.4–10.4)
CO2: 30 mmol/L — AB (ref 22–29)
CREATININE: 0.85 mg/dL (ref 0.60–1.10)
Chloride: 101 mmol/L (ref 98–109)
GFR calc non Af Amer: >60 mL/min (ref >60)
GLUCOSE: 99 mg/dL (ref 70–140)
Potassium: 3 mmol/L — CL (ref 3.3–4.7)
SODIUM: 139 mmol/L (ref 136–145)
Total Bilirubin: <0.2 mg/dL — ABNORMAL LOW (ref 0.2–1.2)
Total Protein: 6.5 g/dL (ref 6.4–8.3)

## 2018-01-05 MED ORDER — SODIUM CHLORIDE 0.9 % IV SOLN
Freq: Once | INTRAVENOUS | Status: AC
  Administered 2018-01-05: 11:00:00 via INTRAVENOUS
  Filled 2018-01-05: qty 5

## 2018-01-05 MED ORDER — SODIUM CHLORIDE 0.9 % IV SOLN
800.0000 mg/m2 | Freq: Once | INTRAVENOUS | Status: AC
  Administered 2018-01-05: 1558 mg via INTRAVENOUS
  Filled 2018-01-05: qty 40.98

## 2018-01-05 MED ORDER — HEPARIN SOD (PORK) LOCK FLUSH 100 UNIT/ML IV SOLN
500.0000 [IU] | Freq: Once | INTRAVENOUS | Status: AC | PRN
  Administered 2018-01-05: 500 [IU]
  Filled 2018-01-05: qty 5

## 2018-01-05 MED ORDER — ACETAMINOPHEN 325 MG PO TABS
650.0000 mg | ORAL_TABLET | Freq: Once | ORAL | Status: AC
  Administered 2018-01-05: 650 mg via ORAL

## 2018-01-05 MED ORDER — SODIUM CHLORIDE 0.9 % IV SOLN: INTRAVENOUS | Status: AC

## 2018-01-05 MED ORDER — SODIUM CHLORIDE 0.9 % IV SOLN
Freq: Once | INTRAVENOUS | Status: AC
  Administered 2018-01-05: 10:00:00 via INTRAVENOUS

## 2018-01-05 MED ORDER — TRASTUZUMAB CHEMO 150 MG IV SOLR
450.0000 mg | Freq: Once | INTRAVENOUS | Status: AC
  Administered 2018-01-05: 450 mg via INTRAVENOUS
  Filled 2018-01-05: qty 21.43

## 2018-01-05 MED ORDER — SODIUM CHLORIDE 0.9 % IV SOLN
Freq: Once | INTRAVENOUS | Status: AC
  Administered 2018-01-05: 11:00:00 via INTRAVENOUS

## 2018-01-05 MED ORDER — SODIUM CHLORIDE 0.9 % IV SOLN: Freq: Once | INTRAVENOUS | Status: DC

## 2018-01-05 MED ORDER — DIPHENHYDRAMINE HCL 25 MG PO CAPS
ORAL_CAPSULE | ORAL | Status: AC
  Filled 2018-01-05: qty 1

## 2018-01-05 MED ORDER — SODIUM CHLORIDE 0.9 % IV SOLN
485.5000 mg | Freq: Once | INTRAVENOUS | Status: AC
  Administered 2018-01-05: 490 mg via INTRAVENOUS
  Filled 2018-01-05: qty 49

## 2018-01-05 MED ORDER — SODIUM CHLORIDE 0.9% FLUSH
10.0000 mL | Freq: Once | INTRAVENOUS | Status: AC
  Administered 2018-01-05: 10 mL
  Filled 2018-01-05: qty 10

## 2018-01-05 MED ORDER — PALONOSETRON HCL INJECTION 0.25 MG/5ML
0.2500 mg | Freq: Once | INTRAVENOUS | Status: AC
  Administered 2018-01-05: 0.25 mg via INTRAVENOUS

## 2018-01-05 MED ORDER — PALONOSETRON HCL INJECTION 0.25 MG/5ML
INTRAVENOUS | Status: AC
  Filled 2018-01-05: qty 5

## 2018-01-05 MED ORDER — DIPHENHYDRAMINE HCL 25 MG PO CAPS
25.0000 mg | ORAL_CAPSULE | Freq: Once | ORAL | Status: AC
  Administered 2018-01-05: 25 mg via ORAL

## 2018-01-05 MED ORDER — SODIUM CHLORIDE 0.9% FLUSH
10.0000 mL | INTRAVENOUS | Status: DC | PRN
  Administered 2018-01-05: 10 mL
  Filled 2018-01-05: qty 10

## 2018-01-05 MED ORDER — ACETAMINOPHEN 325 MG PO TABS
ORAL_TABLET | ORAL | Status: AC
  Filled 2018-01-05: qty 2

## 2018-01-05 MED ORDER — SODIUM CHLORIDE 0.9 % IV SOLN
420.0000 mg | Freq: Once | INTRAVENOUS | Status: AC
  Administered 2018-01-05: 420 mg via INTRAVENOUS
  Filled 2018-01-05: qty 14

## 2018-01-05 NOTE — Telephone Encounter (Signed)
Left vm for patient that MRI is scheduled for 01/27/18 at 9 am at [facility].

## 2018-01-05 NOTE — Progress Notes (Signed)
[facility]  [phone] Fax:(336) [phone]     ID: RAYLEEN WYRICK DOB: 15-Dec-1951  MR#: [id number]  LNL#:[id number]  Patient Care Team: Bartholome Bill, MD as PCP - General (Family Medicine) [name], MD as Consulting Physician (General Surgery) Magrinat, [name], MD as Consulting Physician (Oncology) [name], MD as Attending Physician (Radiation Oncology) OTHER MD:  CHIEF COMPLAINT: Triple positive breast cancer  CURRENT TREATMENT: Neoadjuvant chemotherapy/immunotherapy.   HISTORY OF CURRENT ILLNESS: From the original intake note:  [name] noted a change in her right breast sometime in July 2018. [facility] to report that she had found a "kernel" in her breast. She was advised to see her primary physician which she did. The patient was then set up for bilateral diagnostic mammography with tomography and right breast ultrasonography at [facility] 09/03/2017. The breast density was category I a. In the upper outer quadrant of the right breast there was a 2.1 cm high density mass, which was palpable. Also in the right breast more posteriorly there was a 1.5 cm high density mass which was felt to be a suspicious lymph node. Ultrasound of the right breast confirmed a 2.1 centimeter lobulated solid mass in the right breast upper outer quadrant 15.8 cm from the nipple in the 10:00 radiant. There was a 1.5 cm oval mass in the right axillary tail with a small rounded masses also suspicious for lymph node involvement. A total of 3 suspicious lymph nodes were identified.  On 09/03/2017 the patient underwent biopsy of the breast mass and the suspicious right axillary lymph node. The final pathology ([id number]) found invasive ductal carcinoma, grade 3, in both. Both tumors were estrogen receptor positive at 70-75%, and both were progesterone receptor negative. Both had an elevated proliferation marker at 90%. The mass in the breast was HER-2  negative, with a signals ratio of 1.25 and the number per cell 1.88. The lymph node mass however was HER-2 positive with a signals ratio of 2.57, and the number per cell 3.60.  The patient's subsequent history is as detailed below.  INTERVAL HISTORY: Maybelline returns today for follow-up and treatment of her triple positive breast cancer, which is being treated neo-adjuvantly. Today is day 1 cycle 6 of Trastuzumab/pertuzumab and cycle 5 of Gemcitabine/Carboplatin.  She is tolerating this treatment well.    REVIEW OF SYSTEMS: Amelya is doing moderately well today.  She tells me that she will not be in for follow up appointment after this cycle of chemotherapy because she doesn't think she needs it.   The skin on her hands and her nails continue to improve.  She does note some mild hyperpigmentation, but otherwise is feeling fine.  She denies any chest pain, palpitations, nausea, vomiting, constipation, diarrhea, mucositis, or any other concerns.  She would like to receive some fluids today because she feels like they help her, since she doesn't like to drink water with her mild taste changes.  Otherwise, a detailed ROS is non contributory today.    PAST MEDICAL HISTORY: Past Medical History:  Diagnosis Date  . Angina pectoris ([place])   . Asthma   . Cancer ([place])    right breast  . Constipation   . Diverticulitis   . Headache   . HPV in female   . Hyperlipidemia   . Hypertension   . Hypothyroidism   . Wears dentures   . Wears glasses     PAST SURGICAL HISTORY: Past Surgical History:  Procedure Laterality  Date  . CESAREAN SECTION    . COLON SURGERY  2012   sigmoidectomy  . COLOSTOMY    . COLOSTOMY TAKEDOWN  10/06/11  . IR BONE TUMOR(S)RF ABLATION  11/02/2017  . IR KYPHO THORACIC WITH BONE BIOPSY  11/02/2017  . IR RADIOLOGIST EVAL & MGMT  10/07/2017  . IR RADIOLOGIST EVAL & MGMT  11/25/2017  . PORTACATH PLACEMENT N/A 09/25/2017   Procedure: INSERTION PORT-A-CATH WITH ULTRA SOUND ERAS  PATHWAY;  Surgeon: [name], MD;  Location: [facility];  Service: General;  Laterality: N/A;  . TONSILLECTOMY    . TUBAL LIGATION      FAMILY HISTORY Family History  Problem Relation Age of Onset  . Cancer Mother        rectal  . Hypertension Brother   . Hypertension Brother   . Asthma Son   . Asthma Brother    The patient has little information regarding her father. Her mother died at age 68 from a strange cancer--the patient does not know what it was, it may well have been cervical cancer from her description. The patient has one brother, no sisters. There is no history of breast or ovarian cancer in the family to the patient's knowledge   GYNECOLOGIC HISTORY:  No LMP recorded. Patient is postmenopausal.  menarche age 67, first live birth age 85 the patient is [place] P3. She stopped having periods at age 15. She never used oral contraceptives or hormone replacement.   SOCIAL HISTORY:  Works as a Scientist, product/process development. She is divorced. Currently her son [name] lives with her. He is a Art gallery manager. The 2 other children are CHRISTABELL LOSEKE, who lives in [place] and works in the theater and media, and Fianna Snowball lives in [place], and is vice president of a [employer]. The patient has 5 grandchildren. She is a Psychologist, forensic.     ADVANCED DIRECTIVES: Not in place    HEALTH MAINTENANCE: Social History   Tobacco Use  . Smoking status: Current Some Day Smoker    Packs/day: 0.50    Years: 40.00    Pack years: 20.00    Types: Cigarettes  . Smokeless tobacco: Never Used  . Tobacco comment: Pt already has info  Substance Use Topics  . Alcohol use: Yes    Comment: occasional  . Drug use: No     Colonoscopy: September 2012   PAP:  Bone density:09/03/2017    Allergies  Allergen Reactions  . Fish Allergy Anaphylaxis and Swelling    Swelling of hands and feet.  . Peanut-Containing Drug Products Anaphylaxis    Tree nuts included in allergic reaction.  .  Ace Inhibitors Other (See Comments) and Cough    Cold like symptoms     Current Outpatient Medications  Medication Sig Dispense Refill  . albuterol (VENTOLIN HFA) 108 (90 BASE) MCG/ACT inhaler Inhale 1-2 puffs into the lungs every 6 (six) hours as needed for wheezing or shortness of breath. 1 Inhaler 6  . Alirocumab (PRALUENT) 75 MG/ML SOPN Inject 75 mg into the muscle every 14 (fourteen) days.     Marland Kitchen amLODipine (NORVASC) 10 MG tablet Take 10 mg by mouth daily.     . cetirizine (ZYRTEC) 10 MG tablet   11  . dexamethasone (DECADRON) 4 MG tablet Take 2 tablets (8 mg total) by mouth 2 (two) times daily. Start the day before Taxotere. Then again the day after chemo for 3 days. 30 tablet 1  . diphenoxylate-atropine (LOMOTIL) 2.5-0.025  MG tablet Take 1 tablet by mouth 4 (four) times daily as needed for diarrhea or loose stools. 60 tablet 0  . levothyroxine (SYNTHROID, LEVOTHROID) 50 MCG tablet Take 50 mcg by mouth daily.    Marland Kitchen lidocaine-prilocaine (EMLA) cream Apply to affected area once 30 g 3  . loratadine (CLARITIN) 10 MG tablet Take 1 tablet (10 mg total) by mouth daily. 20 tablet 3  . LORazepam (ATIVAN) 0.5 MG tablet Take 1 tablet (0.5 mg total) by mouth at bedtime as needed (Nausea or vomiting). 30 tablet 0  . losartan-hydrochlorothiazide (HYZAAR) 100-12.5 MG tablet Take 1 tablet by mouth daily.   3  . magic mouthwash SOLN Take 5 mLs 4 (four) times daily as needed by mouth for mouth pain. 240 mL 0  . metoprolol succinate (TOPROL-XL) 50 MG 24 hr tablet Take 50 mg by mouth daily.   6  . mometasone-formoterol (DULERA) 100-5 MCG/ACT AERO Inhale 2 puffs into the lungs 2 (two) times daily. 3 Inhaler 0  . Multiple Vitamins-Minerals (ICAPS AREDS 2) CAPS Take 1 capsule by mouth 2 (two) times daily. 60 capsule 3  . nitroGLYCERIN (NITROSTAT) 0.4 MG SL tablet Place 0.4 mg under the tongue every 5 (five) minutes as needed for chest pain.    Marland Kitchen ondansetron (ZOFRAN) 8 MG tablet Take 1 tablet (8 mg total) by  mouth every 8 (eight) hours as needed for nausea or vomiting. 20 tablet 0  . potassium chloride (K-DUR) 10 MEQ tablet Take 2 tablets (20 mEq total) daily by mouth. 60 tablet 0  . prochlorperazine (COMPAZINE) 10 MG tablet Take 1 tablet (10 mg total) by mouth every 6 (six) hours as needed (Nausea or vomiting). 30 tablet 1  . Skin Protectants, Misc. (EUCERIN) cream Apply topically as needed for dry skin. 454 g 0  . traMADol (ULTRAM) 50 MG tablet Take 1 tablet (50 mg total) by mouth every 8 (eight) hours as needed (pain). 30 tablet 0   Current Facility-Administered Medications  Medication Dose Route Frequency Provider Last Rate Last Dose  . 0.9 %  sodium chloride infusion   Intravenous Once Garielle Mroz, [name], NP       Facility-Administered Medications Ordered in Other Visits  Medication Dose Route Frequency Provider Last Rate Last Dose  . CARBOplatin (PARAPLATIN) 490 mg in sodium chloride 0.9 % 250 mL chemo infusion  490 mg Intravenous Once Magrinat, [name], MD      . gemcitabine (GEMZAR) 1,558 mg in sodium chloride 0.9 % 250 mL chemo infusion  800 mg/m2 (Treatment Plan Recorded) Intravenous Once Magrinat, [name], MD      . heparin lock flush 100 unit/mL  500 Units Intracatheter Once PRN Magrinat, [name], MD      . pertuzumab (PERJETA) 420 mg in sodium chloride 0.9 % 250 mL chemo infusion  420 mg Intravenous Once Magrinat, [name], MD      . sodium chloride flush (NS) 0.9 % injection 10 mL  10 mL Intracatheter PRN Magrinat, [name], MD      . trastuzumab (HERCEPTIN) 450 mg in sodium chloride 0.9 % 250 mL chemo infusion  450 mg Intravenous Once Magrinat, [name], MD 542.9 mL/hr at 01/05/18 1146 450 mg at 01/05/18 1146    OBJECTIVE:   Vitals:   01/05/18 0920  BP: 114/76  Pulse: 75  Resp: 12  Temp: 98.1 F (36.7 C)  SpO2: 99%     Body mass index is 28.77 kg/m.   Wt Readings from Last 3 Encounters:  01/05/18 165 lb (74.8 kg)  12/24/17 167 lb 4.8 oz (75.9 kg)  12/15/17 170  lb (77.1 kg)  ECOG FS:1 - Symptomatic but completely ambulatory GENERAL: Patient is a well appearing female in no acute distress HEENT:  Sclerae anicteric.  Oropharynx clear and moist.   No evidence of oropharyngeal candidiasis. Neck is supple.  NODES:  No cervical, supraclavicular, or axillary lymphadenopathy palpated.  BREAST EXAM:  Deferred. LUNGS:  Clear to auscultation bilaterally.  No wheezes or rhonchi. HEART:  Regular rate and rhythm. No murmur appreciated. ABDOMEN:  Soft, nontender.  Positive, normoactive bowel sounds. No organomegaly palpated. MSK:  No focal spinal tenderness to palpation. Full range of motion bilaterally in the upper extremities. EXTREMITIES:  No peripheral edema.   SKIN:  Skin on hands bilaterally is peeling, erythema has resolved. NEURO:  Nonfocal. Well oriented.  Appropriate affect.      LAB RESULTS:  CMP     Component Value Date/Time   NA 139 01/05/2018 0826   NA 138 12/24/2017 0752   K 3.0 (LL) 01/05/2018 0826   K 3.3 (L) 12/24/2017 0752   CL 101 01/05/2018 0826   CO2 30 (H) 01/05/2018 0826   CO2 25 12/24/2017 0752   GLUCOSE 99 01/05/2018 0826   GLUCOSE 99 12/24/2017 0752   BUN 10 01/05/2018 0826   BUN 7.9 12/24/2017 0752   CREATININE 0.85 01/05/2018 0826   CREATININE 0.7 12/24/2017 0752   CALCIUM 9.8 01/05/2018 0826   CALCIUM 9.2 12/24/2017 0752   PROT 6.5 01/05/2018 0826   PROT 6.3 (L) 12/24/2017 0752   ALBUMIN 3.5 01/05/2018 0826   ALBUMIN 3.4 (L) 12/24/2017 0752   AST 13 01/05/2018 0826   AST 15 12/24/2017 0752   ALT 16 01/05/2018 0826   ALT 18 12/24/2017 0752   ALKPHOS 100 01/05/2018 0826   ALKPHOS 93 12/24/2017 0752   BILITOT <0.2 (L) 01/05/2018 0826   BILITOT <0.22 12/24/2017 0752   GFRNONAA >60 01/05/2018 0826   GFRAA >60 01/05/2018 0826    No results found for: Ronnald Ramp, A1GS, A2GS, BETS, BETA2SER, GAMS, MSPIKE, SPEI  No results found for: Nils Pyle, [facility]  Lab Results  Component  Value Date   WBC 4.5 01/05/2018   NEUTROABS 1.7 01/05/2018   HGB 10.3 (L) 01/05/2018   HCT 32.0 (L) 01/05/2018   MCV 95.5 01/05/2018   PLT 307 01/05/2018      Chemistry      Component Value Date/Time   NA 139 01/05/2018 0826   NA 138 12/24/2017 0752   K 3.0 (LL) 01/05/2018 0826   K 3.3 (L) 12/24/2017 0752   CL 101 01/05/2018 0826   CO2 30 (H) 01/05/2018 0826   CO2 25 12/24/2017 0752   BUN 10 01/05/2018 0826   BUN 7.9 12/24/2017 0752   CREATININE 0.85 01/05/2018 0826   CREATININE 0.7 12/24/2017 0752      Component Value Date/Time   CALCIUM 9.8 01/05/2018 0826   CALCIUM 9.2 12/24/2017 0752   ALKPHOS 100 01/05/2018 0826   ALKPHOS 93 12/24/2017 0752   AST 13 01/05/2018 0826   AST 15 12/24/2017 0752   ALT 16 01/05/2018 0826   ALT 18 12/24/2017 0752   BILITOT <0.2 (L) 01/05/2018 0826   BILITOT <0.22 12/24/2017 0752       No results found for: LABCA2  No components found for: [id number]    Appointment on 01/05/2018  Component Date Value Ref Range Status  . Sodium 01/05/2018 139  136 - 145 mmol/L  Final  . Potassium 01/05/2018 3.0* 3.3 - 4.7 mmol/L Final   CRITICAL RESULT CALLED TO, READ BACK BY AND VERIFIED WITH: VALERIE    . Chloride 01/05/2018 101  98 - 109 mmol/L Final  . CO2 01/05/2018 30* 22 - 29 mmol/L Final  . Glucose, Bld 01/05/2018 99  70 - 140 mg/dL Final  . BUN 01/05/2018 10  7 - 26 mg/dL Final  . Creatinine, Ser 01/05/2018 0.85  0.60 - 1.10 mg/dL Final  . Calcium 01/05/2018 9.8  8.4 - 10.4 mg/dL Final  . Total Protein 01/05/2018 6.5  6.4 - 8.3 g/dL Final  . Albumin 01/05/2018 3.5  3.5 - 5.0 g/dL Final  . AST 01/05/2018 13  5 - 34 U/L Final  . ALT 01/05/2018 16  0 - 55 U/L Final  . Alkaline Phosphatase 01/05/2018 100  40 - 150 U/L Final  . Total Bilirubin 01/05/2018 <0.2* 0.2 - 1.2 mg/dL Final  . GFR calc non Af Amer 01/05/2018 >60  >60 mL/min Final  . GFR calc Af Amer 01/05/2018 >60  >60 mL/min Final   Comment: (NOTE) The eGFR has been calculated  using the CKD EPI equation. This calculation has not been validated in all clinical situations. eGFR's persistently <60 mL/min signify possible Chronic Kidney Disease.   Georgiann Hahn gap 01/05/2018 8  3 - 11 Final   Performed at [facility], [place] [address] [place], [place] [address]  . WBC 01/05/2018 4.5  3.9 - 10.3 K/uL Final  . RBC 01/05/2018 3.35* 3.70 - 5.45 MIL/uL Final  . Hemoglobin 01/05/2018 10.3* 11.6 - 15.9 g/dL Final  . HCT 01/05/2018 32.0* 34.8 - 46.6 % Final  . MCV 01/05/2018 95.5  79.5 - 101.0 fL Final  . MCH 01/05/2018 30.7  25.1 - 34.0 pg Final  . MCHC 01/05/2018 32.2  31.5 - 36.0 g/dL Final  . RDW 01/05/2018 21.0* 11.2 - 16.1 % Final  . Platelets 01/05/2018 307  145 - 400 K/uL Final  . Neutrophils Relative % 01/05/2018 39  % Final  . Neutro Abs 01/05/2018 1.7  1.5 - 6.5 K/uL Final  . Abs Granulocyte 01/05/2018 1.7  1.5 - 6.5 K/uL Final  . Lymphocytes Relative 01/05/2018 46  % Final  . Lymphs Abs 01/05/2018 2.1  0.9 - 3.3 K/uL Final  . Monocytes Relative 01/05/2018 13  % Final  . Monocytes Absolute 01/05/2018 0.6  0.1 - 0.9 K/uL Final  . Eosinophils Relative 01/05/2018 2  % Final  . Eosinophils Absolute 01/05/2018 0.1  0.0 - 0.5 K/uL Final  . Basophils Relative 01/05/2018 0  % Final  . Basophils Absolute 01/05/2018 0.0  0.0 - 0.1 K/uL Final   Performed at [facility], [address] [place], [place] [address]    (this displays the last labs from the last 3 days)  No results found for: TOTALPROTELP, ALBUMINELP, A1GS, A2GS, BETS, BETA2SER, GAMS, MSPIKE, SPEI (this displays SPEP labs)  No results found for: KPAFRELGTCHN, LAMBDASER, KAPLAMBRATIO (kappa/lambda light chains)  No results found for: HGBA, HGBA2QUANT, HGBFQUANT, HGBSQUAN (Hemoglobinopathy evaluation)   No results found for: LDH  No results found for: IRON, TIBC, IRONPCTSAT (Iron and TIBC)  No results found for: FERRITIN  Urinalysis    Component  Value Date/Time   LABSPEC 1.020 02/10/2011 2058   PHURINE 6.0 02/10/2011 2058   HGBUR LARGE (A) 02/10/2011 2058   BILIRUBINUR MODERATE (A) 02/10/2011 2058   KETONESUR 15 (A) 02/10/2011 2058   PROTEINUR >=300 (  A) 02/10/2011 2058   UROBILINOGEN >=8.0 02/10/2011 2058   NITRITE NEGATIVE 02/10/2011 2058   LEUKOCYTESUR  02/10/2011 2058    NEGATIVE Biochemical Testing Only. Please order routine urinalysis from main lab if confirmatory testing is needed.     STUDIES:  No results found.   ELIGIBLE FOR AVAILABLE RESEARCH PROTOCOL: no  ASSESSMENT: 67 y.o. [place] woman status post right breast upper outer quadrant and right axillary lymph node biopsy 09/03/2017, both positive for invasive ductal carcinoma, grade 3, estrogen receptor positive, progesterone receptor negative, with an MIB-1 of 90%, the lymph node being HER-2 positive, the breast mass HER-2 negative  (a) staging studies September 24, 2017 show a lytic lesion in T8, possible areas of concern at L1-L2  (b) osteochondral/biopsy procedure of T8 lesion scheduled for November 02, 2017  (1) neoadjuvant chemotherapy with carboplatin, docetaxel, trastuzumab, and pertuzumab started 10/01/2017, Docetaxel dropped after three cycles and Gemcitabine/Carbo started on 12/15/2017.   (2) trastuzumab to continue to complete 12 months  (3) definitive surgery to follow chemotherapy  (4) adjuvant radiation to follow  (5) anti-estrogens to start at the completion of local treatment  PLAN:  Emiah is doing well today.  She denies any issues.  Her labs are stable.  She is feeling well.  She will proceed with treatment.  After review with Dr. Jana Hakim, she is back on track to receive all four of her treatments today, meaning Gemcitabine, Carboplatin, Trastuzumab, Pertuzumab.  She denies any issues with this today.  She knows to call next week and we will work her in if she is feeling poorly.    I went ahead and ordered her MRI as her last cycle of  treatment is due on 1/29.  I will also request her follow up with Dr. Dalbert Batman.    Keisha knows to call for any questions or concerns prior to her next appointment with [place].  She verbalizes understanding of the above plan.    A total of (30) minutes of face-to-face time was spent with this patient with greater than 50% of that time in counseling and care-coordination.   Wilber Bihari, NP  01/05/18 12:03 PM Medical Oncology and Hematology [facility] [address] [place], [place] [address] Tel. [phone]    Fax. [phone]

## 2018-01-05 NOTE — Patient Instructions (Addendum)
[facility] Discharge Instructions for Patients Receiving Chemotherapy  Take your Potassium tablets as ordered. Call Doctor for loose stools.  Today you received the following chemotherapy agents: Herceptin, Perjeta, Taxotere, and Carboplatin   To help prevent nausea and vomiting after your treatment, we encourage you to take your nausea medication as directed   If you develop nausea and vomiting that is not controlled by your nausea medication, call the clinic.   BELOW ARE SYMPTOMS THAT SHOULD BE REPORTED IMMEDIATELY:  *FEVER GREATER THAN 100.5 F  *CHILLS WITH OR WITHOUT FEVER  NAUSEA AND VOMITING THAT IS NOT CONTROLLED WITH YOUR NAUSEA MEDICATION  *UNUSUAL SHORTNESS OF BREATH  *UNUSUAL BRUISING OR BLEEDING  TENDERNESS IN MOUTH AND THROAT WITH OR WITHOUT PRESENCE OF ULCERS  *URINARY PROBLEMS  *BOWEL PROBLEMS  UNUSUAL RASH Items with * indicate a potential emergency and should be followed up as soon as possible.  Feel free to call the clinic should you have any questions or concerns. The clinic phone number is [phone].  Please show the [place] at check-in to the Emergency Department and triage nurse.   Dehydration, Adult Dehydration is a condition in which there is not enough fluid or water in the body. This happens when you lose more fluids than you take in. Important organs, such as the kidneys, brain, and heart, cannot function without a proper amount of fluids. Any loss of fluids from the body can lead to dehydration. Dehydration can range from mild to severe. This condition should be treated right away to prevent it from becoming severe. What are the causes? This condition may be caused by:  Vomiting.  Diarrhea.  Excessive sweating, such as from heat exposure or exercise.  Not drinking enough fluid, especially: ? When ill. ? While doing activity that requires a lot of energy.  Excessive  urination.  Fever.  Infection.  Certain medicines, such as medicines that cause the body to lose excess fluid (diuretics).  Inability to access safe drinking water.  Reduced physical ability to get adequate water and food.  What increases the risk? This condition is more likely to develop in people:  Who have a poorly controlled long-term (chronic) illness, such as diabetes, heart disease, or kidney disease.  Who are age 69 or older.  Who are disabled.  Who live in a place with high altitude.  Who play endurance sports.  What are the signs or symptoms? Symptoms of mild dehydration may include:  Thirst.  Dry lips.  Slightly dry mouth.  Dry, warm skin.  Dizziness. Symptoms of moderate dehydration may include:  Very dry mouth.  Muscle cramps.  Dark urine. Urine may be the color of tea.  Decreased urine production.  Decreased tear production.  Heartbeat that is irregular or faster than normal (palpitations).  Headache.  Light-headedness, especially when you stand up from a sitting position.  Fainting (syncope). Symptoms of severe dehydration may include:  Changes in skin, such as: ? Cold and clammy skin. ? Blotchy (mottled) or pale skin. ? Skin that does not quickly return to normal after being lightly pinched and released (poor skin turgor).  Changes in body fluids, such as: ? Extreme thirst. ? No tear production. ? Inability to sweat when body temperature is high, such as in hot weather. ? Very little urine production.  Changes in vital signs, such as: ? Weak pulse. ? Pulse that is more than 100 beats a minute when sitting still. ? Rapid breathing. ? Low blood pressure.  Other changes, such as: ? Sunken eyes. ? Cold hands and feet. ? Confusion. ? Lack of energy (lethargy). ? Difficulty waking up from sleep. ? Short-term weight loss. ? Unconsciousness. How is this diagnosed? This condition is diagnosed based on your symptoms and a  physical exam. Blood and urine tests may be done to help confirm the diagnosis. How is this treated? Treatment for this condition depends on the severity. Mild or moderate dehydration can often be treated at home. Treatment should be started right away. Do not wait until dehydration becomes severe. Severe dehydration is an emergency and it needs to be treated in a hospital. Treatment for mild dehydration may include:  Drinking more fluids.  Replacing salts and minerals in your blood (electrolytes) that you may have lost. Treatment for moderate dehydration may include:  Drinking an oral rehydration solution (ORS). This is a drink that helps you replace fluids and electrolytes (rehydrate). It can be found at pharmacies and retail stores. Treatment for severe dehydration may include:  Receiving fluids through an IV tube.  Receiving an electrolyte solution through a feeding tube that is passed through your nose and into your stomach (nasogastric tube, or NG tube).  Correcting any abnormalities in electrolytes.  Treating the underlying cause of dehydration. Follow these instructions at home:  If directed by your health care provider, drink an ORS: ? Make an ORS by following instructions on the package. ? Start by drinking small amounts, about  cup (120 mL) every 5-10 minutes. ? Slowly increase how much you drink until you have taken the amount recommended by your health care provider.  Drink enough clear fluid to keep your urine clear or pale yellow. If you were told to drink an ORS, finish the ORS first, then start slowly drinking other clear fluids. Drink fluids such as: ? Water. Do not drink only water. Doing that can lead to having too little salt (sodium) in the body (hyponatremia). ? Ice chips. ? Fruit juice that you have added water to (diluted fruit juice). ? Low-calorie sports drinks.  Avoid: ? Alcohol. ? Drinks that contain a lot of sugar. These include high-calorie sports  drinks, fruit juice that is not diluted, and soda. ? Caffeine. ? Foods that are greasy or contain a lot of fat or sugar.  Take over-the-counter and prescription medicines only as told by your health care provider.  Do not take sodium tablets. This can lead to having too much sodium in the body (hypernatremia).  Eat foods that contain a healthy balance of electrolytes, such as bananas, oranges, potatoes, tomatoes, and spinach.  Keep all follow-up visits as told by your health care provider. This is important. Contact a health care provider if:  You have abdominal pain that: ? Gets worse. ? Stays in one area (localizes).  You have a rash.  You have a stiff neck.  You are more irritable than usual.  You are sleepier or more difficult to wake up than usual.  You feel weak or dizzy.  You feel very thirsty.  You have urinated only a small amount of very dark urine over 6-8 hours. Get help right away if:  You have symptoms of severe dehydration.  You cannot drink fluids without vomiting.  Your symptoms get worse with treatment.  You have a fever.  You have a severe headache.  You have vomiting or diarrhea that: ? Gets worse. ? Does not go away.  You have blood or green matter (bile) in your vomit.  You  have blood in your stool. This may cause stool to look black and tarry.  You have not urinated in 6-8 hours.  You faint.  Your heart rate while sitting still is over 100 beats a minute.  You have trouble breathing. This information is not intended to replace advice given to you by your health care provider. Make sure you discuss any questions you have with your health care provider. Document Released: 12/15/2005 Document Revised: 07/11/2016 Document Reviewed: 02/08/2016 Elsevier Interactive Patient Education  [employer].

## 2018-01-07 ENCOUNTER — Telehealth: Payer: Self-pay | Admitting: Adult Health

## 2018-01-07 NOTE — Telephone Encounter (Signed)
Per 1/8 no los °

## 2018-01-18 ENCOUNTER — Other Ambulatory Visit: Payer: Self-pay | Admitting: Adult Health

## 2018-01-18 DIAGNOSIS — E876 Hypokalemia: Secondary | ICD-10-CM

## 2018-01-18 MED ORDER — POTASSIUM CHLORIDE ER 10 MEQ PO TBCR: 20.0000 meq | EXTENDED_RELEASE_TABLET | Freq: Every day | ORAL | 0 refills | Status: DC

## 2018-01-18 NOTE — Progress Notes (Signed)
[place]  [phone] [phone]     ID: Tracie [name] DOB: October 18, 1951  MR#: [id number]  XBL#:[id number]  Patient Care Team: Bartholome Bill, MD as PCP - General (Family Medicine) [name], MD as Consulting Physician (General Surgery) Magrinat, [name], MD as Consulting Physician (Oncology) [name], MD as Attending Physician (Radiation Oncology) OTHER MD:  CHIEF COMPLAINT: Triple positive breast cancer  CURRENT TREATMENT: Neoadjuvant chemotherapy/immunotherapy.   HISTORY OF CURRENT ILLNESS: From the original intake note:  Tracie [name] noted a change in her right breast sometime in July 2018. [facility] to report that she had found a "kernel" in her breast. She was advised to see her primary physician which she did. The patient was then set up for bilateral diagnostic mammography with tomography and right breast ultrasonography at [facility] 09/03/2017. The breast density was category I a. In the upper outer quadrant of the right breast there was a 2.1 cm high density mass, which was palpable. Also in the right breast more posteriorly there was a 1.5 cm high density mass which was felt to be a suspicious lymph node. Ultrasound of the right breast confirmed a 2.1 centimeter lobulated solid mass in the right breast upper outer quadrant 15.8 cm from the nipple in the 10:00 radiant. There was a 1.5 cm oval mass in the right axillary tail with a small rounded masses also suspicious for lymph node involvement. A total of 3 suspicious lymph nodes were identified.  On 09/03/2017 the patient underwent biopsy of the breast mass and the suspicious right axillary lymph node. The final pathology ([id number]) found invasive ductal carcinoma, grade 3, in both. Both tumors were estrogen receptor positive at 70-75%, and both were progesterone receptor negative. Both had an elevated proliferation marker at 90%. The mass in the breast was HER-2  negative, with a signals ratio of 1.25 and the number per cell 1.88. The lymph node mass however was HER-2 positive with a signals ratio of 2.57, and the number per cell 3.60.  The patient's subsequent history is as detailed below.  INTERVAL HISTORY: Tracie [name] returns today for follow-up and treatment of her triple positive breast cancer, which is being treated neo-adjuvantly. Today is day 1 cycle 6 of 6 planned cycles of trastuzumab/pertuzumab and cycle 5 of Gemcitabine/Carboplatin. She is tolerating treatment well overall since the change to her treatment. She notes that her side effects following chemotherapy are shortened and she has minimal nausea without vomiting.   She is scheduled for a MR Breast bilateral on 01/27/2018. Her surgeon is Dr. Dalbert Batman and her next appointment for pre-op is on 02/11/18  Since her last visit to the office, she underwent a PET scan on 11/30/17 with results showing: Interval resection of the right lateral breast mass and adjacent enlarged lymph node. Interval kyphoplasty of the T8 vertebral lesion. There is some low-grade activity at the site of the prior kyphoplasty with maximum SUV 4.0, but otherwise no residual significant hypermetabolic activity to suggest malignancy is identified in the neck, chest, abdomen/pelvis, or visualized skeleton. Small and thin bilateral axillary lymph nodes are below background mediastinal blood pool activity and most likely benign. Other imaging findings of potential clinical significance: Aortic Atherosclerosis (ICD10-I70.0) and Emphysema (ICD10-J43.9). Mild cardiomegaly. Suspected small bilateral adrenal adenomas. Mild chronic right maxillary sinusitis.     REVIEW OF SYSTEMS: Tracie [name] is doing moderately well today. She exercises by walking a little while at work. She continues to have intermittent mild neuropathy to  her bilateral hands that is alleviating with movement of her hands. She notes that she has lost several small toenails, but  denies losing any fingernails. She is keeping her fingernails short to prevent fingernail loss. She denies numbness/tingling to her bilateral feet. 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 She denies unusual headaches, visual changes, nausea, vomiting, or dizziness. There has been no unusual cough, phlegm production, or pleurisy. This been no change in bowel or bladder habits. She denies unexplained fatigue or unexplained weight loss, bleeding, rash, or fever. A detailed review of systems was otherwise stable.      PAST MEDICAL HISTORY: Past Medical History:  Diagnosis Date  . Angina pectoris ([place])   . Asthma   . Cancer ([place])    right breast  . Constipation   . Diverticulitis   . Headache   . HPV in female   . Hyperlipidemia   . Hypertension   . Hypothyroidism   . Wears dentures   . Wears glasses     PAST SURGICAL HISTORY: Past Surgical History:  Procedure Laterality Date  . CESAREAN SECTION    . COLON SURGERY  2012   sigmoidectomy  . COLOSTOMY    . COLOSTOMY TAKEDOWN  10/06/11  . IR BONE TUMOR(S)RF ABLATION  11/02/2017  . IR KYPHO THORACIC WITH BONE BIOPSY  11/02/2017  . IR RADIOLOGIST EVAL & MGMT  10/07/2017  . IR RADIOLOGIST EVAL & MGMT  11/25/2017  . PORTACATH PLACEMENT N/A 09/25/2017   Procedure: INSERTION PORT-A-CATH WITH ULTRA SOUND ERAS PATHWAY;  Surgeon: [name], MD;  Location: [facility];  Service: General;  Laterality: N/A;  . TONSILLECTOMY    . TUBAL LIGATION      FAMILY HISTORY Family History  Problem Relation Age of Onset  . Cancer Mother        rectal  . Hypertension Brother   . Hypertension Brother   . Asthma Son   . Asthma Brother    The patient has little information regarding her father. Her mother died at age 71 from a strange cancer--the patient does not know what it was, it may  well have been cervical cancer from her description. The patient has one brother, no sisters. There is no history of breast or ovarian cancer in the family to the patient's knowledge   GYNECOLOGIC HISTORY:  No LMP recorded. Patient is postmenopausal.  menarche age 57, first live birth age 26 the patient is [place] P3. She stopped having periods at age 22. She never used oral contraceptives or hormone replacement.   SOCIAL HISTORY:  Works as a Scientist, product/process development. She is divorced. Currently her son [name] lives with her. He is a Art gallery manager. The 2 other children are SAMAIYA AWADALLAH, who lives in [place] and works in the theater and media, and Charman Blasco lives in [place], and is vice president of a [employer]. The patient has 5 grandchildren. She is a Psychologist, forensic.     ADVANCED DIRECTIVES: Not in place    HEALTH MAINTENANCE: Social History   Tobacco Use  . Smoking status: Current Some Day Smoker    Packs/day: 0.50    Years: 40.00    Pack years: 20.00    Types: Cigarettes  . Smokeless tobacco: Never Used  . Tobacco comment: Pt already has  info  Substance Use Topics  . Alcohol use: Yes    Comment: occasional  . Drug use: No     Colonoscopy: September 2012   PAP:  Bone density:09/03/2017    Allergies  Allergen Reactions  . Fish Allergy Anaphylaxis and Swelling    Swelling of hands and feet.  . Peanut-Containing Drug Products Anaphylaxis    Tree nuts included in allergic reaction.  . Ace Inhibitors Other (See Comments) and Cough    Cold like symptoms     Current Outpatient Medications  Medication Sig Dispense Refill  . albuterol (VENTOLIN HFA) 108 (90 BASE) MCG/ACT inhaler Inhale 1-2 puffs into the lungs every 6 (six) hours as needed for wheezing or shortness of breath. 1 Inhaler 6  . Alirocumab (PRALUENT) 75 MG/ML SOPN Inject 75 mg into the muscle every 14 (fourteen) days.     Marland Kitchen amLODipine (NORVASC) 10 MG tablet Take 10 mg by mouth daily.     .  cetirizine (ZYRTEC) 10 MG tablet   11  . dexamethasone (DECADRON) 4 MG tablet Take 2 tablets (8 mg total) by mouth 2 (two) times daily. Start the day before Taxotere. Then again the day after chemo for 3 days. 30 tablet 1  . diphenoxylate-atropine (LOMOTIL) 2.5-0.025 MG tablet Take 1 tablet by mouth 4 (four) times daily as needed for diarrhea or loose stools. 60 tablet 0  . levothyroxine (SYNTHROID, LEVOTHROID) 50 MCG tablet Take 50 mcg by mouth daily.    Marland Kitchen lidocaine-prilocaine (EMLA) cream Apply to affected area once 30 g 3  . loratadine (CLARITIN) 10 MG tablet Take 1 tablet (10 mg total) by mouth daily. 20 tablet 3  . LORazepam (ATIVAN) 0.5 MG tablet Take 1 tablet (0.5 mg total) by mouth at bedtime as needed (Nausea or vomiting). 30 tablet 0  . losartan-hydrochlorothiazide (HYZAAR) 100-12.5 MG tablet Take 1 tablet by mouth daily.   3  . magic mouthwash SOLN Take 5 mLs 4 (four) times daily as needed by mouth for mouth pain. 240 mL 0  . metoprolol succinate (TOPROL-XL) 50 MG 24 hr tablet Take 50 mg by mouth daily.   6  . mometasone-formoterol (DULERA) 100-5 MCG/ACT AERO Inhale 2 puffs into the lungs 2 (two) times daily. 3 Inhaler 0  . Multiple Vitamins-Minerals (ICAPS AREDS 2) CAPS Take 1 capsule by mouth 2 (two) times daily. 60 capsule 3  . nitroGLYCERIN (NITROSTAT) 0.4 MG SL tablet Place 0.4 mg under the tongue every 5 (five) minutes as needed for chest pain.    Marland Kitchen ondansetron (ZOFRAN) 8 MG tablet Take 1 tablet (8 mg total) by mouth every 8 (eight) hours as needed for nausea or vomiting. 20 tablet 0  . potassium chloride (K-DUR) 10 MEQ tablet Take 2 tablets (20 mEq total) by mouth daily. 60 tablet 0  . prochlorperazine (COMPAZINE) 10 MG tablet Take 1 tablet (10 mg total) by mouth every 6 (six) hours as needed (Nausea or vomiting). 30 tablet 1  . Skin Protectants, Misc. (EUCERIN) cream Apply topically as needed for dry skin. 454 g 0  . traMADol (ULTRAM) 50 MG tablet Take 1 tablet (50 mg total) by  mouth every 8 (eight) hours as needed (pain). 30 tablet 0   No current facility-administered medications for this visit.     OBJECTIVE: Middle-aged African-American woman in no acute distress  Vitals:   01/26/18 0916  BP: (!) 146/73  Pulse: 84  Resp: 18  Temp: 97.7 F (36.5 C)  SpO2: 97%  Body mass index is 28.53 kg/m.   Wt Readings from Last 3 Encounters:  01/26/18 163 lb 9.6 oz (74.2 kg)  01/05/18 165 lb (74.8 kg)  12/24/17 167 lb 4.8 oz (75.9 kg)  ECOG FS:1 - Symptomatic but completely ambulatory  Sclerae unicteric, pupils round and equal Oropharynx clear and moist No cervical or supraclavicular adenopathy Lungs no rales or rhonchi Heart regular rate and rhythm Abd soft, nontender, positive bowel sounds MSK no focal spinal tenderness, no upper extremity lymphedema Neuro: nonfocal, well oriented, appropriate affect Breasts: I do not palpate a mass in either breast.  There are no skin or nipple changes of concern.  Both axillae are benign.     LAB RESULTS:  CMP     Component Value Date/Time   NA 138 01/26/2018 0816   NA 138 12/24/2017 0752   K 3.4 01/26/2018 0816   K 3.3 (L) 12/24/2017 0752   CL 101 01/26/2018 0816   CO2 29 01/26/2018 0816   CO2 25 12/24/2017 0752   GLUCOSE 103 01/26/2018 0816   GLUCOSE 99 12/24/2017 0752   BUN 10 01/26/2018 0816   BUN 7.9 12/24/2017 0752   CREATININE 0.81 01/26/2018 0816   CREATININE 0.7 12/24/2017 0752   CALCIUM 9.7 01/26/2018 0816   CALCIUM 9.2 12/24/2017 0752   PROT 6.8 01/26/2018 0816   PROT 6.3 (L) 12/24/2017 0752   ALBUMIN 3.5 01/26/2018 0816   ALBUMIN 3.4 (L) 12/24/2017 0752   AST 13 01/26/2018 0816   AST 15 12/24/2017 0752   ALT 11 01/26/2018 0816   ALT 18 12/24/2017 0752   ALKPHOS 95 01/26/2018 0816   ALKPHOS 93 12/24/2017 0752   BILITOT <0.2 (L) 01/26/2018 0816   BILITOT <0.22 12/24/2017 0752   GFRNONAA >60 01/26/2018 0816   GFRAA >60 01/26/2018 0816    No results found for: Ronnald Ramp, A1GS, A2GS, BETS, BETA2SER, GAMS, MSPIKE, SPEI  No results found for: Nils Pyle, [facility]  Lab Results  Component Value Date   WBC 4.5 01/26/2018   NEUTROABS 2.0 01/26/2018   HGB 9.8 (L) 01/26/2018   HCT 30.2 (L) 01/26/2018   MCV 95.9 01/26/2018   PLT 205 01/26/2018      Chemistry      Component Value Date/Time   NA 138 01/26/2018 0816   NA 138 12/24/2017 0752   K 3.4 01/26/2018 0816   K 3.3 (L) 12/24/2017 0752   CL 101 01/26/2018 0816   CO2 29 01/26/2018 0816   CO2 25 12/24/2017 0752   BUN 10 01/26/2018 0816   BUN 7.9 12/24/2017 0752   CREATININE 0.81 01/26/2018 0816   CREATININE 0.7 12/24/2017 0752      Component Value Date/Time   CALCIUM 9.7 01/26/2018 0816   CALCIUM 9.2 12/24/2017 0752   ALKPHOS 95 01/26/2018 0816   ALKPHOS 93 12/24/2017 0752   AST 13 01/26/2018 0816   AST 15 12/24/2017 0752   ALT 11 01/26/2018 0816   ALT 18 12/24/2017 0752   BILITOT <0.2 (L) 01/26/2018 0816   BILITOT <0.22 12/24/2017 0752       No results found for: LABCA2  No components found for: [id number]    Appointment on 01/26/2018  Component Date Value Ref Range Status  . Sodium 01/26/2018 138  136 - 145 mmol/L Final  . Potassium 01/26/2018 3.4  3.3 - 4.7 mmol/L Final  . Chloride 01/26/2018 101  98 - 109 mmol/L Final  . CO2 01/26/2018 29  22 - 29 mmol/L Final  . Glucose,  Bld 01/26/2018 103  70 - 140 mg/dL Final  . BUN 01/26/2018 10  7 - 26 mg/dL Final  . Creatinine, Ser 01/26/2018 0.81  0.60 - 1.10 mg/dL Final  . Calcium 01/26/2018 9.7  8.4 - 10.4 mg/dL Final  . Total Protein 01/26/2018 6.8  6.4 - 8.3 g/dL Final  . Albumin 01/26/2018 3.5  3.5 - 5.0 g/dL Final  . AST 01/26/2018 13  5 - 34 U/L Final  . ALT 01/26/2018 11  0 - 55 U/L Final  . Alkaline Phosphatase 01/26/2018 95  40 - 150 U/L Final  . Total Bilirubin 01/26/2018 <0.2* 0.2 - 1.2 mg/dL Final  . GFR calc non Af Amer 01/26/2018 >60  >60 mL/min Final  . GFR calc Af Amer 01/26/2018 >60   >60 mL/min Final   Comment: (NOTE) The eGFR has been calculated using the CKD EPI equation. This calculation has not been validated in all clinical situations. eGFR's persistently <60 mL/min signify possible Chronic Kidney Disease.   Georgiann Hahn gap 01/26/2018 8  3 - 11 Final   Performed at [facility], [place] [address] [place], [place] [address]  . WBC 01/26/2018 4.5  3.9 - 10.3 K/uL Final  . RBC 01/26/2018 3.15* 3.70 - 5.45 MIL/uL Final  . Hemoglobin 01/26/2018 9.8* 11.6 - 15.9 g/dL Final  . HCT 01/26/2018 30.2* 34.8 - 46.6 % Final  . MCV 01/26/2018 95.9  79.5 - 101.0 fL Final  . MCH 01/26/2018 31.1  25.1 - 34.0 pg Final  . MCHC 01/26/2018 32.5  31.5 - 36.0 g/dL Final  . RDW 01/26/2018 20.5* 11.2 - 16.1 % Final  . Platelets 01/26/2018 205  145 - 400 K/uL Final  . Neutrophils Relative % 01/26/2018 44  % Final  . Neutro Abs 01/26/2018 2.0  1.5 - 6.5 K/uL Final  . Lymphocytes Relative 01/26/2018 41  % Final  . Lymphs Abs 01/26/2018 1.9  0.9 - 3.3 K/uL Final  . Monocytes Relative 01/26/2018 14  % Final  . Monocytes Absolute 01/26/2018 0.6  0.1 - 0.9 K/uL Final  . Eosinophils Relative 01/26/2018 1  % Final  . Eosinophils Absolute 01/26/2018 0.1  0.0 - 0.5 K/uL Final  . Basophils Relative 01/26/2018 0  % Final  . Basophils Absolute 01/26/2018 0.0  0.0 - 0.1 K/uL Final   Performed at [facility], [address] [place], [place] [address]    (this displays the last labs from the last 3 days)  No results found for: TOTALPROTELP, ALBUMINELP, A1GS, A2GS, BETS, BETA2SER, GAMS, MSPIKE, SPEI (this displays SPEP labs)  No results found for: KPAFRELGTCHN, LAMBDASER, KAPLAMBRATIO (kappa/lambda light chains)  No results found for: HGBA, HGBA2QUANT, HGBFQUANT, HGBSQUAN (Hemoglobinopathy evaluation)   No results found for: LDH  No results found for: IRON, TIBC, IRONPCTSAT (Iron and TIBC)  No results found for: FERRITIN  Urinalysis      Component Value Date/Time   LABSPEC 1.020 02/10/2011 2058   PHURINE 6.0 02/10/2011 2058   HGBUR LARGE (A) 02/10/2011 2058   BILIRUBINUR MODERATE (A) 02/10/2011 2058   KETONESUR 15 (A) 02/10/2011 2058   PROTEINUR >=300 (A) 02/10/2011 2058   UROBILINOGEN >=8.0 02/10/2011 2058   NITRITE NEGATIVE 02/10/2011 2058   LEUKOCYTESUR  02/10/2011 2058    NEGATIVE Biochemical Testing Only. Please order routine urinalysis from main lab if confirmatory testing is needed.     STUDIES: PET scan on 11/30/17 with results showing: Interval resection of the right lateral breast mass and  adjacent enlarged lymph node. Interval kyphoplasty of the T8 vertebral lesion. There is some low-grade activity at the site of the prior kyphoplasty with maximum SUV 4.0, but otherwise no residual significant hypermetabolic activity to suggest malignancy is identified in the neck, chest, abdomen/pelvis, or visualized skeleton. Small and thin bilateral axillary lymph nodes are below background mediastinal blood pool activity and most likely benign. Other imaging findings of potential clinical significance: Aortic Atherosclerosis (ICD10-I70.0) and Emphysema (ICD10-J43.9). Mild cardiomegaly. Suspected small bilateral adrenal adenomas. Mild chronic right maxillary sinusitis.   ELIGIBLE FOR AVAILABLE RESEARCH PROTOCOL: no  ASSESSMENT: 67 y.o. [place] woman status post right breast upper outer quadrant and right axillary lymph node biopsy 09/03/2017, both positive for invasive ductal carcinoma, grade 3, estrogen receptor positive, progesterone receptor negative, with an MIB-1 of 90%, the lymph node being HER-2 positive, the breast mass HER-2 negative  (a) staging studies September 24, 2017 show a lytic lesion in T8, possible areas of concern at L1-L2  (b) biopsy/kyphoplasty of T8 lesion 11/02/2017 confirms metastatic adenocarcinoma, 20% estrogen receptor positive, estrogen receptor negative  (c) PET scan 11/30/2017 shows no visceral  disease, minimal metabolic activity at T8, no other bone lesions  (1) neoadjuvant chemotherapy with carboplatin, docetaxel, trastuzumab, and pertuzumab started 10/01/2017,   (a) Docetaxel dropped after three cycles and Gemcitabine/Carbo started on 12/15/2017.   (2) trastuzumab to continue indefinitely  (a) echocardiogram 1138 2018 shows an ejection fraction in the 55-60% range  (3) definitive surgery to follow chemotherapy  (4) adjuvant radiation to follow  (5) anti-estrogens to start at the completion of local treatment  PLAN:  Xylah completes her chemotherapy today.  Overall she has done remarkably well and I am expecting very good news and her MRI tomorrow.  She will see Dr. Dalbert Batman mid February.  She will discuss definitive surgery at that time.  Even though she is technically stage IV, she has oligo metastatic disease and the plan is to treat for cure.  That means she will have what ever surgery Dr. Dalbert Batman feels would be appropriate for someone with stage II or III disease.  Of course the MRI will be very helpful there  She will then proceed to adjuvant radiation and she will also receive radiation to the T8 area to sterilize that single known bony metastatic deposit  She was apprised today to learn that she had stage IV disease and that she will need to trastuzumab indefinitely.  We went over all that in detail all over incidentally her next echocardiogram is already scheduled for the first week in March  She knows to call for any other issues that may develop before her next visit.  Magrinat, [name], MD  01/26/18 9:38 AM Medical Oncology and Hematology [facility] [address] [place], [place] [address] Tel. [phone]    Fax. [phone]    This document serves as a record of services personally performed by Lurline Del, MD. It was created on his behalf by Steva Colder, a trained medical scribe. The creation of this record is based on the  scribe's personal observations and the provider's statements to them.   I have reviewed the above documentation for accuracy and completeness, and I agree with the above.

## 2018-01-26 ENCOUNTER — Telehealth: Payer: Self-pay | Admitting: Oncology

## 2018-01-26 ENCOUNTER — Inpatient Hospital Stay: Payer: 59

## 2018-01-26 ENCOUNTER — Inpatient Hospital Stay (HOSPITAL_BASED_OUTPATIENT_CLINIC_OR_DEPARTMENT_OTHER): Payer: 59 | Admitting: Oncology

## 2018-01-26 VITALS — BP 146/73 | HR 84 | Temp 97.7°F | Resp 18 | Ht 63.5 in | Wt 163.6 lb

## 2018-01-26 DIAGNOSIS — C50411 Malignant neoplasm of upper-outer quadrant of right female breast: Secondary | ICD-10-CM

## 2018-01-26 DIAGNOSIS — J320 Chronic maxillary sinusitis: Secondary | ICD-10-CM

## 2018-01-26 DIAGNOSIS — J432 Centrilobular emphysema: Secondary | ICD-10-CM | POA: Insufficient documentation

## 2018-01-26 DIAGNOSIS — Z170 Estrogen receptor positive status [ER+]: Principal | ICD-10-CM

## 2018-01-26 DIAGNOSIS — C7951 Secondary malignant neoplasm of bone: Secondary | ICD-10-CM | POA: Diagnosis not present

## 2018-01-26 DIAGNOSIS — C773 Secondary and unspecified malignant neoplasm of axilla and upper limb lymph nodes: Secondary | ICD-10-CM | POA: Diagnosis not present

## 2018-01-26 DIAGNOSIS — Z5111 Encounter for antineoplastic chemotherapy: Secondary | ICD-10-CM | POA: Diagnosis not present

## 2018-01-26 LAB — COMPREHENSIVE METABOLIC PANEL
ALBUMIN: 3.5 g/dL (ref 3.5–5.0)
ALK PHOS: 95 U/L (ref 40–150)
ALT: 11 U/L (ref 0–55)
AST: 13 U/L (ref 5–34)
Anion gap: 8 (ref 3–11)
BUN: 10 mg/dL (ref 7–26)
CALCIUM: 9.7 mg/dL (ref 8.4–10.4)
CHLORIDE: 101 mmol/L (ref 98–109)
CO2: 29 mmol/L (ref 22–29)
CREATININE: 0.81 mg/dL (ref 0.60–1.10)
GFR calc Af Amer: >60 mL/min (ref >60)
GFR calc non Af Amer: >60 mL/min (ref >60)
Glucose, Bld: 103 mg/dL (ref 70–140)
Potassium: 3.4 mmol/L (ref 3.3–4.7)
SODIUM: 138 mmol/L (ref 136–145)
Total Bilirubin: <0.2 mg/dL — ABNORMAL LOW (ref 0.2–1.2)
Total Protein: 6.8 g/dL (ref 6.4–8.3)

## 2018-01-26 LAB — CBC WITH DIFFERENTIAL/PLATELET
BASOS PCT: 0 %
Basophils Absolute: 0 10*3/uL (ref 0.0–0.1)
EOS ABS: 0.1 10*3/uL (ref 0.0–0.5)
Eosinophils Relative: 1 %
HEMATOCRIT: 30.2 % — AB (ref 34.8–46.6)
Hemoglobin: 9.8 g/dL — ABNORMAL LOW (ref 11.6–15.9)
LYMPHS ABS: 1.9 10*3/uL (ref 0.9–3.3)
Lymphocytes Relative: 41 %
MCH: 31.1 pg (ref 25.1–34.0)
MCHC: 32.5 g/dL (ref 31.5–36.0)
MCV: 95.9 fL (ref 79.5–101.0)
MONO ABS: 0.6 10*3/uL (ref 0.1–0.9)
MONOS PCT: 14 %
NEUTROS ABS: 2 10*3/uL (ref 1.5–6.5)
Neutrophils Relative %: 44 %
Platelets: 205 10*3/uL (ref 145–400)
RBC: 3.15 MIL/uL — ABNORMAL LOW (ref 3.70–5.45)
RDW: 20.5 % — AB (ref 11.2–16.1)
WBC: 4.5 10*3/uL (ref 3.9–10.3)

## 2018-01-26 MED ORDER — HEPARIN SOD (PORK) LOCK FLUSH 100 UNIT/ML IV SOLN
500.0000 [IU] | Freq: Once | INTRAVENOUS | Status: AC | PRN
  Administered 2018-01-26: 500 [IU]
  Filled 2018-01-26: qty 5

## 2018-01-26 MED ORDER — SODIUM CHLORIDE 0.9 % IV SOLN
440.0000 mg | Freq: Once | INTRAVENOUS | Status: AC
  Administered 2018-01-26: 440 mg via INTRAVENOUS
  Filled 2018-01-26: qty 44

## 2018-01-26 MED ORDER — DIPHENHYDRAMINE HCL 25 MG PO CAPS
ORAL_CAPSULE | ORAL | Status: AC
  Filled 2018-01-26: qty 1

## 2018-01-26 MED ORDER — PERTUZUMAB CHEMO INJECTION 420 MG/14ML
420.0000 mg | Freq: Once | INTRAVENOUS | Status: AC
  Administered 2018-01-26: 420 mg via INTRAVENOUS
  Filled 2018-01-26: qty 14

## 2018-01-26 MED ORDER — SODIUM CHLORIDE 0.9 % IV SOLN
Freq: Once | INTRAVENOUS | Status: AC
  Administered 2018-01-26: 11:00:00 via INTRAVENOUS
  Filled 2018-01-26: qty 5

## 2018-01-26 MED ORDER — ACETAMINOPHEN 325 MG PO TABS
650.0000 mg | ORAL_TABLET | Freq: Once | ORAL | Status: AC
  Administered 2018-01-26: 650 mg via ORAL

## 2018-01-26 MED ORDER — ACETAMINOPHEN 325 MG PO TABS
ORAL_TABLET | ORAL | Status: AC
  Filled 2018-01-26: qty 2

## 2018-01-26 MED ORDER — SODIUM CHLORIDE 0.9 % IV SOLN
Freq: Once | INTRAVENOUS | Status: AC
  Administered 2018-01-26: 10:00:00 via INTRAVENOUS

## 2018-01-26 MED ORDER — SODIUM CHLORIDE 0.9 % IV SOLN
Freq: Once | INTRAVENOUS | Status: AC
  Administered 2018-01-26: 11:00:00 via INTRAVENOUS

## 2018-01-26 MED ORDER — SODIUM CHLORIDE 0.9 % IV SOLN
800.0000 mg/m2 | Freq: Once | INTRAVENOUS | Status: AC
  Administered 2018-01-26: 1558 mg via INTRAVENOUS
  Filled 2018-01-26: qty 40.98

## 2018-01-26 MED ORDER — SODIUM CHLORIDE 0.9% FLUSH
10.0000 mL | INTRAVENOUS | Status: DC | PRN
  Administered 2018-01-26: 10 mL
  Filled 2018-01-26: qty 10

## 2018-01-26 MED ORDER — DIPHENHYDRAMINE HCL 25 MG PO CAPS
25.0000 mg | ORAL_CAPSULE | Freq: Once | ORAL | Status: AC
  Administered 2018-01-26: 25 mg via ORAL

## 2018-01-26 MED ORDER — TRASTUZUMAB CHEMO 150 MG IV SOLR
450.0000 mg | Freq: Once | INTRAVENOUS | Status: AC
  Administered 2018-01-26: 450 mg via INTRAVENOUS
  Filled 2018-01-26: qty 21.43

## 2018-01-26 MED ORDER — PALONOSETRON HCL INJECTION 0.25 MG/5ML
INTRAVENOUS | Status: AC
  Filled 2018-01-26: qty 5

## 2018-01-26 MED ORDER — SODIUM CHLORIDE 0.9% FLUSH
10.0000 mL | Freq: Once | INTRAVENOUS | Status: AC
  Administered 2018-01-26: 10 mL
  Filled 2018-01-26: qty 10

## 2018-01-26 MED ORDER — PALONOSETRON HCL INJECTION 0.25 MG/5ML
0.2500 mg | Freq: Once | INTRAVENOUS | Status: AC
  Administered 2018-01-26: 0.25 mg via INTRAVENOUS

## 2018-01-26 NOTE — Patient Instructions (Signed)
[facility] Discharge Instructions for Patients Receiving Chemotherapy  Take your Potassium tablets as ordered. Call Doctor for loose stools.  Today you received the following chemotherapy agents: Herceptin, Perjeta, Gemzar, and Carboplatin.  To help prevent nausea and vomiting after your treatment, we encourage you to take your nausea medication as directed   If you develop nausea and vomiting that is not controlled by your nausea medication, call the clinic.   BELOW ARE SYMPTOMS THAT SHOULD BE REPORTED IMMEDIATELY:  *FEVER GREATER THAN 100.5 F  *CHILLS WITH OR WITHOUT FEVER  NAUSEA AND VOMITING THAT IS NOT CONTROLLED WITH YOUR NAUSEA MEDICATION  *UNUSUAL SHORTNESS OF BREATH  *UNUSUAL BRUISING OR BLEEDING  TENDERNESS IN MOUTH AND THROAT WITH OR WITHOUT PRESENCE OF ULCERS  *URINARY PROBLEMS  *BOWEL PROBLEMS  UNUSUAL RASH Items with * indicate a potential emergency and should be followed up as soon as possible.  Feel free to call the clinic should you have any questions or concerns. The clinic phone number is [phone].  Please show the [place] at check-in to the Emergency Department and triage nurse.   Dehydration, Adult Dehydration is a condition in which there is not enough fluid or water in the body. This happens when you lose more fluids than you take in. Important organs, such as the kidneys, brain, and heart, cannot function without a proper amount of fluids. Any loss of fluids from the body can lead to dehydration. Dehydration can range from mild to severe. This condition should be treated right away to prevent it from becoming severe. What are the causes? This condition may be caused by:  Vomiting.  Diarrhea.  Excessive sweating, such as from heat exposure or exercise.  Not drinking enough fluid, especially: ? When ill. ? While doing activity that requires a lot of energy.  Excessive urination.  Fever.  Infection.  Certain  medicines, such as medicines that cause the body to lose excess fluid (diuretics).  Inability to access safe drinking water.  Reduced physical ability to get adequate water and food.  What increases the risk? This condition is more likely to develop in people:  Who have a poorly controlled long-term (chronic) illness, such as diabetes, heart disease, or kidney disease.  Who are age 69 or older.  Who are disabled.  Who live in a place with high altitude.  Who play endurance sports.  What are the signs or symptoms? Symptoms of mild dehydration may include:  Thirst.  Dry lips.  Slightly dry mouth.  Dry, warm skin.  Dizziness. Symptoms of moderate dehydration may include:  Very dry mouth.  Muscle cramps.  Dark urine. Urine may be the color of tea.  Decreased urine production.  Decreased tear production.  Heartbeat that is irregular or faster than normal (palpitations).  Headache.  Light-headedness, especially when you stand up from a sitting position.  Fainting (syncope). Symptoms of severe dehydration may include:  Changes in skin, such as: ? Cold and clammy skin. ? Blotchy (mottled) or pale skin. ? Skin that does not quickly return to normal after being lightly pinched and released (poor skin turgor).  Changes in body fluids, such as: ? Extreme thirst. ? No tear production. ? Inability to sweat when body temperature is high, such as in hot weather. ? Very little urine production.  Changes in vital signs, such as: ? Weak pulse. ? Pulse that is more than 100 beats a minute when sitting still. ? Rapid breathing. ? Low blood pressure.  Other changes, such as: ? Sunken eyes. ? Cold hands and feet. ? Confusion. ? Lack of energy (lethargy). ? Difficulty waking up from sleep. ? Short-term weight loss. ? Unconsciousness. How is this diagnosed? This condition is diagnosed based on your symptoms and a physical exam. Blood and urine tests may be done to  help confirm the diagnosis. How is this treated? Treatment for this condition depends on the severity. Mild or moderate dehydration can often be treated at home. Treatment should be started right away. Do not wait until dehydration becomes severe. Severe dehydration is an emergency and it needs to be treated in a hospital. Treatment for mild dehydration may include:  Drinking more fluids.  Replacing salts and minerals in your blood (electrolytes) that you may have lost. Treatment for moderate dehydration may include:  Drinking an oral rehydration solution (ORS). This is a drink that helps you replace fluids and electrolytes (rehydrate). It can be found at pharmacies and retail stores. Treatment for severe dehydration may include:  Receiving fluids through an IV tube.  Receiving an electrolyte solution through a feeding tube that is passed through your nose and into your stomach (nasogastric tube, or NG tube).  Correcting any abnormalities in electrolytes.  Treating the underlying cause of dehydration. Follow these instructions at home:  If directed by your health care provider, drink an ORS: ? Make an ORS by following instructions on the package. ? Start by drinking small amounts, about  cup (120 mL) every 5-10 minutes. ? Slowly increase how much you drink until you have taken the amount recommended by your health care provider.  Drink enough clear fluid to keep your urine clear or pale yellow. If you were told to drink an ORS, finish the ORS first, then start slowly drinking other clear fluids. Drink fluids such as: ? Water. Do not drink only water. Doing that can lead to having too little salt (sodium) in the body (hyponatremia). ? Ice chips. ? Fruit juice that you have added water to (diluted fruit juice). ? Low-calorie sports drinks.  Avoid: ? Alcohol. ? Drinks that contain a lot of sugar. These include high-calorie sports drinks, fruit juice that is not diluted, and  soda. ? Caffeine. ? Foods that are greasy or contain a lot of fat or sugar.  Take over-the-counter and prescription medicines only as told by your health care provider.  Do not take sodium tablets. This can lead to having too much sodium in the body (hypernatremia).  Eat foods that contain a healthy balance of electrolytes, such as bananas, oranges, potatoes, tomatoes, and spinach.  Keep all follow-up visits as told by your health care provider. This is important. Contact a health care provider if:  You have abdominal pain that: ? Gets worse. ? Stays in one area (localizes).  You have a rash.  You have a stiff neck.  You are more irritable than usual.  You are sleepier or more difficult to wake up than usual.  You feel weak or dizzy.  You feel very thirsty.  You have urinated only a small amount of very dark urine over 6-8 hours. Get help right away if:  You have symptoms of severe dehydration.  You cannot drink fluids without vomiting.  Your symptoms get worse with treatment.  You have a fever.  You have a severe headache.  You have vomiting or diarrhea that: ? Gets worse. ? Does not go away.  You have blood or green matter (bile) in your vomit.  You  have blood in your stool. This may cause stool to look black and tarry.  You have not urinated in 6-8 hours.  You faint.  Your heart rate while sitting still is over 100 beats a minute.  You have trouble breathing. This information is not intended to replace advice given to you by your health care provider. Make sure you discuss any questions you have with your health care provider. Document Released: 12/15/2005 Document Revised: 07/11/2016 Document Reviewed: 02/08/2016 Elsevier Interactive Patient Education  [employer].

## 2018-01-26 NOTE — Addendum Note (Signed)
Addended by: Lurline Del C on: 01/26/2018 10:20 AM   Modules accepted: Orders

## 2018-01-26 NOTE — Telephone Encounter (Signed)
Gave patient AVs and calendar of upcoming February and march appointments.  °

## 2018-01-27 ENCOUNTER — Ambulatory Visit (HOSPITAL_COMMUNITY)
Admission: RE | Admit: 2018-01-27 | Discharge: 2018-01-27 | Disposition: A | Discharge status: Home / Self Care | Payer: 59 | Source: Ambulatory Visit | Attending: Adult Health | Admitting: Adult Health

## 2018-01-27 DIAGNOSIS — Z853 Personal history of malignant neoplasm of breast: Secondary | ICD-10-CM | POA: Insufficient documentation

## 2018-01-27 DIAGNOSIS — C50411 Malignant neoplasm of upper-outer quadrant of right female breast: Secondary | ICD-10-CM

## 2018-01-27 DIAGNOSIS — Z9889 Other specified postprocedural states: Secondary | ICD-10-CM | POA: Insufficient documentation

## 2018-01-27 DIAGNOSIS — Z170 Estrogen receptor positive status [ER+]: Secondary | ICD-10-CM

## 2018-01-27 MED ORDER — GADOBENATE DIMEGLUMINE 529 MG/ML IV SOLN
15.0000 mL | Freq: Once | INTRAVENOUS | Status: AC | PRN
  Administered 2018-01-27: 15 mL via INTRAVENOUS

## 2018-02-03 ENCOUNTER — Encounter: Payer: Self-pay | Admitting: Oncology

## 2018-02-11 ENCOUNTER — Other Ambulatory Visit: Payer: Self-pay | Admitting: General Surgery

## 2018-02-11 DIAGNOSIS — C50411 Malignant neoplasm of upper-outer quadrant of right female breast: Secondary | ICD-10-CM

## 2018-02-11 DIAGNOSIS — Z170 Estrogen receptor positive status [ER+]: Principal | ICD-10-CM

## 2018-02-16 ENCOUNTER — Inpatient Hospital Stay: Payer: 59

## 2018-02-16 ENCOUNTER — Inpatient Hospital Stay (HOSPITAL_BASED_OUTPATIENT_CLINIC_OR_DEPARTMENT_OTHER): Payer: 59 | Admitting: Adult Health

## 2018-02-16 ENCOUNTER — Inpatient Hospital Stay: Payer: 59 | Attending: Oncology

## 2018-02-16 ENCOUNTER — Encounter: Payer: Self-pay | Admitting: Adult Health

## 2018-02-16 VITALS — BP 113/60 | HR 78 | Temp 98.4°F | Resp 18 | Ht 63.5 in | Wt 166.1 lb

## 2018-02-16 DIAGNOSIS — C50411 Malignant neoplasm of upper-outer quadrant of right female breast: Secondary | ICD-10-CM

## 2018-02-16 DIAGNOSIS — H6120 Impacted cerumen, unspecified ear: Secondary | ICD-10-CM

## 2018-02-16 DIAGNOSIS — Z5112 Encounter for antineoplastic immunotherapy: Secondary | ICD-10-CM | POA: Insufficient documentation

## 2018-02-16 DIAGNOSIS — C773 Secondary and unspecified malignant neoplasm of axilla and upper limb lymph nodes: Secondary | ICD-10-CM | POA: Insufficient documentation

## 2018-02-16 DIAGNOSIS — I10 Essential (primary) hypertension: Secondary | ICD-10-CM | POA: Diagnosis not present

## 2018-02-16 DIAGNOSIS — Z170 Estrogen receptor positive status [ER+]: Secondary | ICD-10-CM

## 2018-02-16 DIAGNOSIS — C7951 Secondary malignant neoplasm of bone: Secondary | ICD-10-CM | POA: Diagnosis not present

## 2018-02-16 LAB — CBC WITH DIFFERENTIAL/PLATELET
Basophils Absolute: 0 10*3/uL (ref 0.0–0.1)
Basophils Relative: 0 %
Eosinophils Absolute: 0.1 10*3/uL (ref 0.0–0.5)
Eosinophils Relative: 1 %
HEMATOCRIT: 28.7 % — AB (ref 34.8–46.6)
Hemoglobin: 9.3 g/dL — ABNORMAL LOW (ref 11.6–15.9)
LYMPHS PCT: 46 %
Lymphs Abs: 1.9 10*3/uL (ref 0.9–3.3)
MCH: 31.4 pg (ref 25.1–34.0)
MCHC: 32.4 g/dL (ref 31.5–36.0)
MCV: 97 fL (ref 79.5–101.0)
MONO ABS: 0.6 10*3/uL (ref 0.1–0.9)
MONOS PCT: 13 %
NEUTROS ABS: 1.7 10*3/uL (ref 1.5–6.5)
Neutrophils Relative %: 40 %
Platelets: 234 10*3/uL (ref 145–400)
RBC: 2.96 MIL/uL — ABNORMAL LOW (ref 3.70–5.45)
RDW: 20.7 % — AB (ref 11.2–14.5)
WBC: 4.3 10*3/uL (ref 3.9–10.3)

## 2018-02-16 LAB — COMPREHENSIVE METABOLIC PANEL
ALBUMIN: 3.4 g/dL — AB (ref 3.5–5.0)
ALT: 11 U/L (ref 0–55)
ANION GAP: 9 (ref 3–11)
AST: 14 U/L (ref 5–34)
Alkaline Phosphatase: 91 U/L (ref 40–150)
BUN: 10 mg/dL (ref 7–26)
CO2: 28 mmol/L (ref 22–29)
Calcium: 9.7 mg/dL (ref 8.4–10.4)
Chloride: 101 mmol/L (ref 98–109)
Creatinine, Ser: 0.8 mg/dL (ref 0.60–1.10)
GFR calc Af Amer: >60 mL/min (ref >60)
GFR calc non Af Amer: >60 mL/min (ref >60)
GLUCOSE: 94 mg/dL (ref 70–140)
POTASSIUM: 3.1 mmol/L — AB (ref 3.5–5.1)
Sodium: 138 mmol/L (ref 136–145)
Total Bilirubin: <0.2 mg/dL — ABNORMAL LOW (ref 0.2–1.2)
Total Protein: 6.6 g/dL (ref 6.4–8.3)

## 2018-02-16 MED ORDER — TRASTUZUMAB CHEMO 150 MG IV SOLR
450.0000 mg | Freq: Once | INTRAVENOUS | Status: AC
  Administered 2018-02-16: 450 mg via INTRAVENOUS
  Filled 2018-02-16: qty 21.43

## 2018-02-16 MED ORDER — DIPHENHYDRAMINE HCL 25 MG PO CAPS
25.0000 mg | ORAL_CAPSULE | Freq: Once | ORAL | Status: AC
  Administered 2018-02-16: 25 mg via ORAL

## 2018-02-16 MED ORDER — ACETAMINOPHEN 325 MG PO TABS
650.0000 mg | ORAL_TABLET | Freq: Once | ORAL | Status: AC
  Administered 2018-02-16: 650 mg via ORAL

## 2018-02-16 MED ORDER — ACETAMINOPHEN 325 MG PO TABS
ORAL_TABLET | ORAL | Status: AC
  Filled 2018-02-16: qty 2

## 2018-02-16 MED ORDER — SODIUM CHLORIDE 0.9 % IV SOLN
Freq: Once | INTRAVENOUS | Status: AC
  Administered 2018-02-16: 10:00:00 via INTRAVENOUS

## 2018-02-16 MED ORDER — METOPROLOL SUCCINATE ER 50 MG PO TB24: 50.0000 mg | ORAL_TABLET | Freq: Every day | ORAL | 0 refills | Status: DC

## 2018-02-16 MED ORDER — SODIUM CHLORIDE 0.9% FLUSH
10.0000 mL | INTRAVENOUS | Status: DC | PRN
  Administered 2018-02-16: 10 mL
  Filled 2018-02-16: qty 10

## 2018-02-16 MED ORDER — SODIUM CHLORIDE 0.9% FLUSH
10.0000 mL | Freq: Once | INTRAVENOUS | Status: AC
Start: 2018-02-16 — End: 2018-02-16
  Administered 2018-02-16: 10 mL
  Filled 2018-02-16: qty 10

## 2018-02-16 MED ORDER — HEPARIN SOD (PORK) LOCK FLUSH 100 UNIT/ML IV SOLN
500.0000 [IU] | Freq: Once | INTRAVENOUS | Status: AC | PRN
  Administered 2018-02-16: 500 [IU]
  Filled 2018-02-16: qty 5

## 2018-02-16 MED ORDER — DIPHENHYDRAMINE HCL 25 MG PO CAPS
ORAL_CAPSULE | ORAL | Status: AC
  Filled 2018-02-16: qty 1

## 2018-02-16 NOTE — Patient Instructions (Signed)
[facility] Discharge Instructions for Patients Receiving Chemotherapy  Today you received the following chemotherapy agents Herceptin  To help prevent nausea and vomiting after your treatment, we encourage you to take your nausea medication as directed   If you develop nausea and vomiting that is not controlled by your nausea medication, call the clinic.   BELOW ARE SYMPTOMS THAT SHOULD BE REPORTED IMMEDIATELY:  *FEVER GREATER THAN 100.5 F  *CHILLS WITH OR WITHOUT FEVER  NAUSEA AND VOMITING THAT IS NOT CONTROLLED WITH YOUR NAUSEA MEDICATION  *UNUSUAL SHORTNESS OF BREATH  *UNUSUAL BRUISING OR BLEEDING  TENDERNESS IN MOUTH AND THROAT WITH OR WITHOUT PRESENCE OF ULCERS  *URINARY PROBLEMS  *BOWEL PROBLEMS  UNUSUAL RASH Items with * indicate a potential emergency and should be followed up as soon as possible.  Feel free to call the clinic should you have any questions or concerns. The clinic phone number is [phone].  Please show the CHEMO ALERT CARD at check-in to the Emergency Department and triage nurse.   

## 2018-02-16 NOTE — Progress Notes (Signed)
[facility]  [phone] [phone]     ID: Tracie [name] DOB: 1951/12/09  MR#: [id number]  IZT#:[id number]  Patient Care Team: Bartholome Bill, MD as PCP - General (Family Medicine) [name], MD as Consulting Physician (General Surgery) Magrinat, [name], MD as Consulting Physician (Oncology) [name], MD as Attending Physician (Radiation Oncology) OTHER MD:  CHIEF COMPLAINT: Triple positive breast cancer  CURRENT TREATMENT: Neoadjuvant chemotherapy/immunotherapy.   HISTORY OF CURRENT ILLNESS: From the original intake note:  Tracie [name] noted a change in her right breast sometime in July 2018. [facility] to report that she had found a "kernel" in her breast. She was advised to see her primary physician which she did. The patient was then set up for bilateral diagnostic mammography with tomography and right breast ultrasonography at [facility] 09/03/2017. The breast density was category I a. In the upper outer quadrant of the right breast there was a 2.1 cm high density mass, which was palpable. Also in the right breast more posteriorly there was a 1.5 cm high density mass which was felt to be a suspicious lymph node. Ultrasound of the right breast confirmed a 2.1 centimeter lobulated solid mass in the right breast upper outer quadrant 15.8 cm from the nipple in the 10:00 radiant. There was a 1.5 cm oval mass in the right axillary tail with a small rounded masses also suspicious for lymph node involvement. A total of 3 suspicious lymph nodes were identified.  On 09/03/2017 the patient underwent biopsy of the breast mass and the suspicious right axillary lymph node. The final pathology ([id number]) found invasive ductal carcinoma, grade 3, in both. Both tumors were estrogen receptor positive at 70-75%, and both were progesterone receptor negative. Both had an elevated proliferation marker at 90%. The mass in the breast was HER-2  negative, with a signals ratio of 1.25 and the number per cell 1.88. The lymph node mass however was HER-2 positive with a signals ratio of 2.57, and the number per cell 3.60.  The patient's subsequent history is as detailed below.  INTERVAL HISTORY: Tracie [name] returns today for follow-up and treatment of her triple positive breast cancer, which is being treated neo-adjuvantly. She has completed her treatments with the Gemcitabine/Carboplatin, and is due for her every three week Trastuzumab today.   She underwent MR Breast bilateral on 01/27/2018 that demonstrated a complete radiographic response to treatment. She saw Dr. Dalbert Batman on 02/11/18 and reviewed her MRI results with him.  She is awaiting scheduling of surgery.       REVIEW OF SYSTEMS: Tracie [name] is doing moderately well today. She has been having ear pain for two weeks and she wants me to look in her ear today.  She also notes that she cannot return to her PCP soon for her hypertension f/u and wants the Toprol-XL refilled for a one time 90 day supply.  Her blood pressure is controlled today, her labs are stable.  She has f/u with cardiology in March.  She denies any other issues today such as shortness of breath, chest pain, palpitations, weight gain, leg swelling, fevers, chills, recent URI, or any other concerns.  A detailed ROS was otherwise non contributory.       PAST MEDICAL HISTORY: Past Medical History:  Diagnosis Date  . Angina pectoris ([place])   . Asthma   . Cancer ([place])    right breast  . Constipation   . Diverticulitis   . Headache   .  HPV in female   . Hyperlipidemia   . Hypertension   . Hypothyroidism   . Wears dentures   . Wears glasses     PAST SURGICAL HISTORY: Past Surgical History:  Procedure Laterality Date  . CESAREAN SECTION    . COLON SURGERY  2012   sigmoidectomy  . COLOSTOMY    . COLOSTOMY TAKEDOWN  10/06/11  . IR BONE TUMOR(S)RF ABLATION  11/02/2017  . IR KYPHO THORACIC WITH BONE BIOPSY  11/02/2017  . IR  RADIOLOGIST EVAL & MGMT  10/07/2017  . IR RADIOLOGIST EVAL & MGMT  11/25/2017  . PORTACATH PLACEMENT N/A 09/25/2017   Procedure: INSERTION PORT-A-CATH WITH ULTRA SOUND ERAS PATHWAY;  Surgeon: [name], MD;  Location: [facility];  Service: General;  Laterality: N/A;  . TONSILLECTOMY    . TUBAL LIGATION      FAMILY HISTORY Family History  Problem Relation Age of Onset  . Cancer Mother        rectal  . Hypertension Brother   . Hypertension Brother   . Asthma Son   . Asthma Brother    The patient has little information regarding her father. Her mother died at age 68 from a strange cancer--the patient does not know what it was, it may well have been cervical cancer from her description. The patient has one brother, no sisters. There is no history of breast or ovarian cancer in the family to the patient's knowledge   GYNECOLOGIC HISTORY:  No LMP recorded. Patient is postmenopausal.  menarche age 66, first live birth age 1 the patient is [place] P3. She stopped having periods at age 45. She never used oral contraceptives or hormone replacement.   SOCIAL HISTORY:  Works as a Scientist, product/process development. She is divorced. Currently her son [name] lives with her. He is a Art gallery manager. The 2 other children are KEENA DINSE, who lives in [place] and works in the theater and media, and Jameriah Trotti lives in [place], and is vice president of a [employer]. The patient has 5 grandchildren. She is a Psychologist, forensic.    ADVANCED DIRECTIVES: Not in place    HEALTH MAINTENANCE: Social History   Tobacco Use  . Smoking status: Current Some Day Smoker    Packs/day: 0.50    Years: 40.00    Pack years: 20.00    Types: Cigarettes  . Smokeless tobacco: Never Used  . Tobacco comment: Pt already has info  Substance Use Topics  . Alcohol use: Yes    Comment: occasional  . Drug use: No     Colonoscopy: September 2012   PAP:  Bone density:09/03/2017    Allergies  Allergen  Reactions  . Fish Allergy Anaphylaxis and Swelling    Swelling of hands and feet.  . Peanut-Containing Drug Products Anaphylaxis    Tree nuts included in allergic reaction.  . Ace Inhibitors Other (See Comments) and Cough    Cold like symptoms     Current Outpatient Medications  Medication Sig Dispense Refill  . albuterol (VENTOLIN HFA) 108 (90 BASE) MCG/ACT inhaler Inhale 1-2 puffs into the lungs every 6 (six) hours as needed for wheezing or shortness of breath. 1 Inhaler 6  . Alirocumab (PRALUENT) 75 MG/ML SOPN Inject 75 mg into the muscle every 14 (fourteen) days.     Marland Kitchen amLODipine (NORVASC) 10 MG tablet Take 10 mg by mouth daily.     . cetirizine (ZYRTEC) 10 MG tablet   11  . dexamethasone (DECADRON)  4 MG tablet Take 2 tablets (8 mg total) by mouth 2 (two) times daily. Start the day before Taxotere. Then again the day after chemo for 3 days. 30 tablet 1  . diphenoxylate-atropine (LOMOTIL) 2.5-0.025 MG tablet Take 1 tablet by mouth 4 (four) times daily as needed for diarrhea or loose stools. 60 tablet 0  . levothyroxine (SYNTHROID, LEVOTHROID) 50 MCG tablet Take 50 mcg by mouth daily.    Marland Kitchen lidocaine-prilocaine (EMLA) cream Apply to affected area once 30 g 3  . loratadine (CLARITIN) 10 MG tablet Take 1 tablet (10 mg total) by mouth daily. 20 tablet 3  . LORazepam (ATIVAN) 0.5 MG tablet Take 1 tablet (0.5 mg total) by mouth at bedtime as needed (Nausea or vomiting). 30 tablet 0  . losartan-hydrochlorothiazide (HYZAAR) 100-12.5 MG tablet Take 1 tablet by mouth daily.   3  . magic mouthwash SOLN Take 5 mLs 4 (four) times daily as needed by mouth for mouth pain. 240 mL 0  . metoprolol succinate (TOPROL-XL) 50 MG 24 hr tablet Take 50 mg by mouth daily.   6  . mometasone-formoterol (DULERA) 100-5 MCG/ACT AERO Inhale 2 puffs into the lungs 2 (two) times daily. 3 Inhaler 0  . Multiple Vitamins-Minerals (ICAPS AREDS 2) CAPS Take 1 capsule by mouth 2 (two) times daily. 60 capsule 3  . nitroGLYCERIN  (NITROSTAT) 0.4 MG SL tablet Place 0.4 mg under the tongue every 5 (five) minutes as needed for chest pain.    Marland Kitchen ondansetron (ZOFRAN) 8 MG tablet Take 1 tablet (8 mg total) by mouth every 8 (eight) hours as needed for nausea or vomiting. 20 tablet 0  . potassium chloride (K-DUR) 10 MEQ tablet Take 2 tablets (20 mEq total) by mouth daily. 60 tablet 0  . prochlorperazine (COMPAZINE) 10 MG tablet Take 1 tablet (10 mg total) by mouth every 6 (six) hours as needed (Nausea or vomiting). 30 tablet 1  . Skin Protectants, Misc. (EUCERIN) cream Apply topically as needed for dry skin. 454 g 0  . traMADol (ULTRAM) 50 MG tablet Take 1 tablet (50 mg total) by mouth every 8 (eight) hours as needed (pain). 30 tablet 0   No current facility-administered medications for this visit.     OBJECTIVE:   Vitals:   02/16/18 0851  BP: 113/60  Pulse: 78  Resp: 18  Temp: 98.4 F (36.9 C)  SpO2: 99%     Body mass index is 28.96 kg/m.   Wt Readings from Last 3 Encounters:  02/16/18 166 lb 1.6 oz (75.3 kg)  01/26/18 163 lb 9.6 oz (74.2 kg)  01/05/18 165 lb (74.8 kg)  ECOG FS:1 - Symptomatic but completely ambulatory GENERAL: Patient is a well appearing female in no acute distress HEENT:  Sclerae anicteric.  Right ear canal with impacted cerumen, left ear TM normal, no cerumen obstructing the canal.    Oropharynx clear and moist. No ulcerations or evidence of oropharyngeal candidiasis. Neck is supple.  NODES:  No cervical, supraclavicular, or axillary lymphadenopathy palpated.  BREAST EXAM:  Deferred. LUNGS:  Clear to auscultation bilaterally.  No wheezes or rhonchi. HEART:  Regular rate and rhythm. No murmur appreciated. ABDOMEN:  Soft, nontender.  Positive, normoactive bowel sounds. No organomegaly palpated. MSK:  No focal spinal tenderness to palpation. Full range of motion bilaterally in the upper extremities. EXTREMITIES:  No peripheral edema.   SKIN:  Clear with no obvious rashes or skin changes. No nail  dyscrasia. NEURO:  Nonfocal. Well oriented.  Appropriate affect.  LAB RESULTS:  CMP     Component Value Date/Time   NA 138 01/26/2018 0816   NA 138 12/24/2017 0752   K 3.4 01/26/2018 0816   K 3.3 (L) 12/24/2017 0752   CL 101 01/26/2018 0816   CO2 29 01/26/2018 0816   CO2 25 12/24/2017 0752   GLUCOSE 103 01/26/2018 0816   GLUCOSE 99 12/24/2017 0752   BUN 10 01/26/2018 0816   BUN 7.9 12/24/2017 0752   CREATININE 0.81 01/26/2018 0816   CREATININE 0.7 12/24/2017 0752   CALCIUM 9.7 01/26/2018 0816   CALCIUM 9.2 12/24/2017 0752   PROT 6.8 01/26/2018 0816   PROT 6.3 (L) 12/24/2017 0752   ALBUMIN 3.5 01/26/2018 0816   ALBUMIN 3.4 (L) 12/24/2017 0752   AST 13 01/26/2018 0816   AST 15 12/24/2017 0752   ALT 11 01/26/2018 0816   ALT 18 12/24/2017 0752   ALKPHOS 95 01/26/2018 0816   ALKPHOS 93 12/24/2017 0752   BILITOT <0.2 (L) 01/26/2018 0816   BILITOT <0.22 12/24/2017 0752   GFRNONAA >60 01/26/2018 0816   GFRAA >60 01/26/2018 0816    No results found for: Ronnald Ramp, A1GS, A2GS, BETS, BETA2SER, GAMS, MSPIKE, SPEI  No results found for: Nils Pyle, [facility]  Lab Results  Component Value Date   WBC 4.3 02/16/2018   NEUTROABS 1.7 02/16/2018   HGB 9.3 (L) 02/16/2018   HCT 28.7 (L) 02/16/2018   MCV 97.0 02/16/2018   PLT 234 02/16/2018      Chemistry      Component Value Date/Time   NA 138 01/26/2018 0816   NA 138 12/24/2017 0752   K 3.4 01/26/2018 0816   K 3.3 (L) 12/24/2017 0752   CL 101 01/26/2018 0816   CO2 29 01/26/2018 0816   CO2 25 12/24/2017 0752   BUN 10 01/26/2018 0816   BUN 7.9 12/24/2017 0752   CREATININE 0.81 01/26/2018 0816   CREATININE 0.7 12/24/2017 0752      Component Value Date/Time   CALCIUM 9.7 01/26/2018 0816   CALCIUM 9.2 12/24/2017 0752   ALKPHOS 95 01/26/2018 0816   ALKPHOS 93 12/24/2017 0752   AST 13 01/26/2018 0816   AST 15 12/24/2017 0752   ALT 11 01/26/2018 0816   ALT 18 12/24/2017 0752    BILITOT <0.2 (L) 01/26/2018 0816   BILITOT <0.22 12/24/2017 0752       No results found for: LABCA2  No components found for: [id number]    Appointment on 02/16/2018  Component Date Value Ref Range Status  . WBC 02/16/2018 4.3  3.9 - 10.3 K/uL Final  . RBC 02/16/2018 2.96* 3.70 - 5.45 MIL/uL Final  . Hemoglobin 02/16/2018 9.3* 11.6 - 15.9 g/dL Final  . HCT 02/16/2018 28.7* 34.8 - 46.6 % Final  . MCV 02/16/2018 97.0  79.5 - 101.0 fL Final  . MCH 02/16/2018 31.4  25.1 - 34.0 pg Final  . MCHC 02/16/2018 32.4  31.5 - 36.0 g/dL Final  . RDW 02/16/2018 20.7* 11.2 - 14.5 % Final  . Platelets 02/16/2018 234  145 - 400 K/uL Final  . Neutrophils Relative % 02/16/2018 40  % Final  . Neutro Abs 02/16/2018 1.7  1.5 - 6.5 K/uL Final  . Lymphocytes Relative 02/16/2018 46  % Final  . Lymphs Abs 02/16/2018 1.9  0.9 - 3.3 K/uL Final  . Monocytes Relative 02/16/2018 13  % Final  . Monocytes Absolute 02/16/2018 0.6  0.1 - 0.9 K/uL Final  . Eosinophils Relative 02/16/2018 1  %  Final  . Eosinophils Absolute 02/16/2018 0.1  0.0 - 0.5 K/uL Final  . Basophils Relative 02/16/2018 0  % Final  . Basophils Absolute 02/16/2018 0.0  0.0 - 0.1 K/uL Final   Performed at [facility], [address] [place], [place] [address]    (this displays the last labs from the last 3 days)  No results found for: TOTALPROTELP, ALBUMINELP, A1GS, A2GS, BETS, BETA2SER, GAMS, MSPIKE, SPEI (this displays SPEP labs)  No results found for: KPAFRELGTCHN, LAMBDASER, KAPLAMBRATIO (kappa/lambda light chains)  No results found for: HGBA, HGBA2QUANT, HGBFQUANT, HGBSQUAN (Hemoglobinopathy evaluation)   No results found for: LDH  No results found for: IRON, TIBC, IRONPCTSAT (Iron and TIBC)  No results found for: FERRITIN  Urinalysis    Component Value Date/Time   LABSPEC 1.020 02/10/2011 2058   PHURINE 6.0 02/10/2011 2058   HGBUR LARGE (A) 02/10/2011 2058   BILIRUBINUR MODERATE (A) 02/10/2011  2058   KETONESUR 15 (A) 02/10/2011 2058   PROTEINUR >=300 (A) 02/10/2011 2058   UROBILINOGEN >=8.0 02/10/2011 2058   NITRITE NEGATIVE 02/10/2011 2058   LEUKOCYTESUR  02/10/2011 2058    NEGATIVE Biochemical Testing Only. Please order routine urinalysis from main lab if confirmatory testing is needed.     STUDIES: PET scan on 11/30/17 with results showing: Interval resection of the right lateral breast mass and adjacent enlarged lymph node. Interval kyphoplasty of the T8 vertebral lesion. There is some low-grade activity at the site of the prior kyphoplasty with maximum SUV 4.0, but otherwise no residual significant hypermetabolic activity to suggest malignancy is identified in the neck, chest, abdomen/pelvis, or visualized skeleton. Small and thin bilateral axillary lymph nodes are below background mediastinal blood pool activity and most likely benign. Other imaging findings of potential clinical significance: Aortic Atherosclerosis (ICD10-I70.0) and Emphysema (ICD10-J43.9). Mild cardiomegaly. Suspected small bilateral adrenal adenomas. Mild chronic right maxillary sinusitis.   ELIGIBLE FOR AVAILABLE RESEARCH PROTOCOL: no  ASSESSMENT: 67 y.o. Tracie [name] woman status post right breast upper outer quadrant and right axillary lymph node biopsy 09/03/2017, both positive for invasive ductal carcinoma, grade 3, estrogen receptor positive, progesterone receptor negative, with an MIB-1 of 90%, the lymph node being HER-2 positive, the breast mass HER-2 negative  (a) staging studies September 24, 2017 show a lytic lesion in T8, possible areas of concern at L1-L2  (b) biopsy/kyphoplasty/osteocool of T8 lesion 11/02/2017 confirms metastatic adenocarcinoma, 20% estrogen receptor positive, estrogen receptor negative  (c) PET scan 11/30/2017 shows no visceral disease, minimal metabolic activity at T8, no other bone lesions  (1) neoadjuvant chemotherapy with carboplatin, docetaxel, trastuzumab, and pertuzumab  started 10/01/2017,   (a) Docetaxel dropped after three cycles and Gemcitabine/Carbo started on 12/15/2017.   (2) trastuzumab to continue indefinitely  (a) echocardiogram 1138 2018 shows an ejection fraction in the 55-60% range  (3) definitive surgery to follow chemotherapy  (4) adjuvant radiation to follow  (5) anti-estrogens to start at the completion of local treatment  PLAN: Anneliese is doing well today.  Her CBC is stable and I reviewed these results with her in detail.  She and I reviewed her plan of care again, and reviewed Dr. Darrel Hoover recommendations.  She still needs to be scheduled for her surgery.  I will send Arrie Aran and Varney Biles a message to f/u with the surgery scheduler today.  She will proceed with Trastuzumab.  We reviewed this in detail.   Elvie's right ear has impacted wax, I recommended she get an ear wax removal kit from the drug  store and use it, and f/u with her PCP or ENT if needed if the issue doesn't resolve in 3-5 days or if it worsens to have them remove the wax.  I gave her a 90 day supply of the Toprol XL.    Marzetta will return in 3 weeks for labs, f/u with Dr. Jana Hakim, and her next Trastuzumab.  She knows to call for any questions or concerns prior to her next visit with [place].    A total of (30) minutes of face-to-face time was spent with this patient with greater than 50% of that time in counseling and care-coordination.    Wilber Bihari, NP  02/16/18 9:01 AM Medical Oncology and Hematology [facility] [address] [place], [place] [address] Tel. [phone]    Fax. [phone]

## 2018-02-17 ENCOUNTER — Telehealth: Payer: Self-pay | Admitting: Adult Health

## 2018-02-17 NOTE — Telephone Encounter (Signed)
Per 2/19 no los °

## 2018-03-01 ENCOUNTER — Other Ambulatory Visit: Payer: Self-pay

## 2018-03-01 ENCOUNTER — Encounter (HOSPITAL_COMMUNITY): Payer: Self-pay | Admitting: Cardiology

## 2018-03-01 ENCOUNTER — Ambulatory Visit (HOSPITAL_BASED_OUTPATIENT_CLINIC_OR_DEPARTMENT_OTHER)
Admission: RE | Admit: 2018-03-01 | Discharge: 2018-03-01 | Disposition: A | Discharge status: Home / Self Care | Payer: 59 | Source: Ambulatory Visit | Attending: Cardiology | Admitting: Cardiology

## 2018-03-01 ENCOUNTER — Ambulatory Visit (HOSPITAL_COMMUNITY)
Admission: RE | Admit: 2018-03-01 | Discharge: 2018-03-01 | Disposition: A | Discharge status: Home / Self Care | Payer: 59 | Source: Ambulatory Visit | Attending: Family Medicine | Admitting: Family Medicine

## 2018-03-01 VITALS — BP 98/60 | HR 77 | Wt 161.5 lb

## 2018-03-01 DIAGNOSIS — F17210 Nicotine dependence, cigarettes, uncomplicated: Secondary | ICD-10-CM | POA: Insufficient documentation

## 2018-03-01 DIAGNOSIS — C50411 Malignant neoplasm of upper-outer quadrant of right female breast: Secondary | ICD-10-CM | POA: Diagnosis not present

## 2018-03-01 DIAGNOSIS — J45909 Unspecified asthma, uncomplicated: Secondary | ICD-10-CM | POA: Insufficient documentation

## 2018-03-01 DIAGNOSIS — I10 Essential (primary) hypertension: Secondary | ICD-10-CM | POA: Insufficient documentation

## 2018-03-01 DIAGNOSIS — C50911 Malignant neoplasm of unspecified site of right female breast: Secondary | ICD-10-CM | POA: Diagnosis not present

## 2018-03-01 DIAGNOSIS — E785 Hyperlipidemia, unspecified: Secondary | ICD-10-CM | POA: Insufficient documentation

## 2018-03-01 DIAGNOSIS — Z79899 Other long term (current) drug therapy: Secondary | ICD-10-CM | POA: Insufficient documentation

## 2018-03-01 DIAGNOSIS — Z79890 Hormone replacement therapy (postmenopausal): Secondary | ICD-10-CM | POA: Diagnosis not present

## 2018-03-01 DIAGNOSIS — Z170 Estrogen receptor positive status [ER+]: Secondary | ICD-10-CM

## 2018-03-01 DIAGNOSIS — E039 Hypothyroidism, unspecified: Secondary | ICD-10-CM | POA: Diagnosis not present

## 2018-03-01 LAB — LIPID PANEL
CHOLESTEROL: 114 mg/dL (ref 0–200)
HDL: 38 mg/dL — ABNORMAL LOW (ref >40)
LDL Cholesterol: 60 mg/dL (ref 0–99)
TRIGLYCERIDES: 80 mg/dL (ref <150)
Total CHOL/HDL Ratio: 3 RATIO
VLDL: 16 mg/dL (ref 0–40)

## 2018-03-01 LAB — HEPATIC FUNCTION PANEL
ALT: 15 U/L (ref 14–54)
AST: 20 U/L (ref 15–41)
Albumin: 3.5 g/dL (ref 3.5–5.0)
Alkaline Phosphatase: 89 U/L (ref 38–126)
BILIRUBIN TOTAL: 0.2 mg/dL — AB (ref 0.3–1.2)
Total Protein: 6.7 g/dL (ref 6.5–8.1)

## 2018-03-01 MED ORDER — ALIROCUMAB 75 MG/ML ~~LOC~~ SOPN: 75.0000 mg | PEN_INJECTOR | SUBCUTANEOUS | 0 refills | Status: DC

## 2018-03-01 NOTE — Progress Notes (Signed)
  Echocardiogram 2D Echocardiogram has been performed.  Lendell Gallick L Androw 03/01/2018, 11:31 AM

## 2018-03-01 NOTE — Patient Instructions (Signed)
Labs drawn today (if we do not call you, then your lab work was stable)   Your physician has requested that you have an echocardiogram. Echocardiography is a painless test that uses sound waves to create images of your heart. It provides your doctor with information about the size and shape of your heart and how well your heart's chambers and valves are working. This procedure takes approximately one hour. There are no restrictions for this procedure.  Your physician recommends that you schedule a follow-up appointment in: 3 months with Dr. McLean  an a echocardiogram    

## 2018-03-02 NOTE — Progress Notes (Signed)
Oncology: Dr. Jana Hakim  67 y.o. with history of HTN, asthma, and hyperlidemia was referred by Dr. Jana Hakim for cardio-oncology evaluation.  She was diagnosed with breast cancer on the right in 9/18. ER+/PR-.  Lymph node was HER2+, breast biopsy was HER2-.  She completed neoadjuvant chemotherapy with carboplatin, docetaxel, trastuzumab, pertuzumab x 6 cycles and now is on trastuzumab alone to complete a year. She will have surgery and radiation soon.    She returns for cardio-oncology followup.  BP is controlled, no lightheadedness, BP generally in the 244W systolic.  She quit smoking but then restarted.  No chest pain or exertional dyspnea.  No orthopnea/PND.   PMH: 1. Asthma: mild. 2. HTN 3. Hyperlipidemia 4. Hypothyroidism 5. Diverticulitis with h/o colectomy.  6. Breast cancer: On right, diagnosed 9/18.  ER+/PR-.  Lymph node was HER2+, breast biopsy was HER2-.  She is getting neoadjuvant chemotherapy with carboplatin, docetaxel, trastuzumab, pertuzumab x 6 cycles then trastuzumab to complete a year. She will have surgery and radiation after chemo.  - Echo (9/18): EF 50-55%, restrictive diastolic function, mild AI, mild-moderate MR.  - Echo (12/18): EF 55-60% with mild focal basal septal hypertrophy, GLS -20.5%, normal RV size and systolic function, mild AI, moderate MR.  - Echo (3/19): EF 60-65%, mild LVH, moderate diastolic dysfunction, strain inaccurate, mild AI, moderate MR, normla RV size and systolic function.   Social History   Socioeconomic History  . Marital status: Divorced    Spouse name: Not on file  . Number of children: Not on file  . Years of education: Not on file  . Highest education level: Not on file  Social Needs  . Financial resource strain: Not on file  . Food insecurity - worry: Not on file  . Food insecurity - inability: Not on file  . Transportation needs - medical: Not on file  . Transportation needs - non-medical: Not on file  Occupational History  . Not  on file  Tobacco Use  . Smoking status: Current Some Day Smoker    Packs/day: 0.50    Years: 40.00    Pack years: 20.00    Types: Cigarettes  . Smokeless tobacco: Never Used  . Tobacco comment: Pt already has info  Substance and Sexual Activity  . Alcohol use: Yes    Comment: occasional  . Drug use: No  . Sexual activity: Not on file  Other Topics Concern  . Not on file  Social History Narrative  . Not on file   Family History  Problem Relation Age of Onset  . Cancer Mother        rectal  . Hypertension Brother   . Hypertension Brother   . Asthma Son   . Asthma Brother    ROS: All systems reviewed and negative except as per HPI.   Current Outpatient Medications  Medication Sig Dispense Refill  . albuterol (VENTOLIN HFA) 108 (90 BASE) MCG/ACT inhaler Inhale 1-2 puffs into the lungs every 6 (six) hours as needed for wheezing or shortness of breath. (Patient taking differently: Inhale 2 puffs into the lungs every 6 (six) hours as needed for wheezing or shortness of breath. ) 1 Inhaler 6  . Alirocumab (PRALUENT) 75 MG/ML SOPN Inject 75 mg into the muscle every 14 (fourteen) days. 6 pen 0  . amLODipine (NORVASC) 10 MG tablet Take 10 mg by mouth daily.     . cetirizine (ZYRTEC) 10 MG tablet Take 10 mg by mouth daily as needed for allergies.   11  .  ibuprofen (ADVIL,MOTRIN) 200 MG tablet Take 400 mg by mouth every 6 (six) hours as needed for headache or moderate pain.    Marland Kitchen levothyroxine (SYNTHROID, LEVOTHROID) 50 MCG tablet Take 50 mcg by mouth daily.    Marland Kitchen lidocaine-prilocaine (EMLA) cream Apply 1 application topically as needed (port access).    Marland Kitchen losartan-hydrochlorothiazide (HYZAAR) 100-12.5 MG tablet Take 1 tablet by mouth daily.   3  . metoprolol succinate (TOPROL-XL) 50 MG 24 hr tablet Take 1 tablet (50 mg total) by mouth daily. 90 tablet 0  . mometasone-formoterol (DULERA) 100-5 MCG/ACT AERO Inhale 2 puffs into the lungs 2 (two) times daily. 3 Inhaler 0  .  Naphazoline-Glycerin (REDNESS RELIEF OP) Place 1 drop into both eyes daily as needed (redness).    . nitroGLYCERIN (NITROSTAT) 0.4 MG SL tablet Place 0.4 mg under the tongue every 5 (five) minutes as needed for chest pain.    . potassium chloride (K-DUR) 10 MEQ tablet Take 2 tablets (20 mEq total) by mouth daily. 60 tablet 0  . Skin Protectants, Misc. (EUCERIN) cream Apply topically as needed for dry skin. 454 g 0  . traMADol (ULTRAM) 50 MG tablet Take 1 tablet (50 mg total) by mouth every 8 (eight) hours as needed (pain). 30 tablet 0   No current facility-administered medications for this encounter.    BP 98/60   Pulse 77   Wt 161 lb 8 oz (73.3 kg)   SpO2 99%   BMI 28.16 kg/m   General: NAD Neck: No JVD, no thyromegaly or thyroid nodule.  Lungs: Clear to auscultation bilaterally with normal respiratory effort. CV: Nondisplaced PMI.  Heart regular S1/S2, no S3/S4, 1/6 HSM apex.  No peripheral edema.  No carotid bruit.  Normal pedal pulses.  Abdomen: Soft, nontender, no hepatosplenomegaly, no distention.  Skin: Intact without lesions or rashes.  Neurologic: Alert and oriented x 3.  Psych: Normal affect. Extremities: No clubbing or cyanosis.  HEENT: Normal.   Assessment/Plan: 1. Smoking: I encouraged her again to quit.  She is going to try nicotine patches.  2. HTN: BP controlled on current regimen. 3. Hyperlipidemia: I will refill her Praluent, check lipids today.  4. Breast cancer: She will be on a Herceptin-based regimen for a year.  I reviewed today's echo, EF is stable.  Strain images did not track well on today's echo so likely inaccurate.  - Repeat echo in 3 months with office visit.  Loralie Champagne 03/02/2018

## 2018-03-02 NOTE — Pre-Procedure Instructions (Signed)
Tracie [name]  03/02/2018      [facility], [place] [address] Phone: [phone] Fax: [phone]    Your procedure is scheduled on March 08, 2018.  Report to [facility] Admitting at 900 AM.  Call this number if you have problems the morning of surgery:  [phone]   Remember:  Do not eat food or drink liquids after midnight.  Take these medicines the morning of surgery with A SIP OF WATER albuterol inhaler, dulera inhaler (bring inhalers with you), amlodipine (norvasc), cetirizine (zyrtec), levothyroxine (synthroid), metoprolol (Toprol-XL), eye drops-if needed, tramadol (ultram)-if needed for pain.  7 days prior to surgery STOP taking any Aspirin (unless otherwise instructed by your surgeon), Aleve, Naproxen, Ibuprofen, Motrin, Advil, Goody's, BC's, all herbal medications, fish oil, and all vitamins  Continue all other medications as instructed by your physician except follow the above medication instructions before surgery   Do not wear jewelry, make-up or nail polish.  Do not wear lotions, powders, or perfumes, or deodorant.  Do not shave 48 hours prior to surgery.    Do not bring valuables to the hospital.  [facility] is not responsible for any belongings or valuables.  Contacts, dentures or bridgework may not be worn into surgery.  Leave your suitcase in the car.  After surgery it may be brought to your room.  For patients admitted to the hospital, discharge time will be determined by your treatment team.  Patients discharged the day of surgery will not be allowed to drive home.   Special instructions:  Tracie [name]- Preparing For Surgery  Before surgery, you can play an important role. Because skin is not sterile, your skin needs to be as free of germs as possible. You can reduce the number of germs on your skin by washing with CHG  (chlorahexidine gluconate) Soap before surgery.  CHG is an antiseptic cleaner which kills germs and bonds with the skin to continue killing germs even after washing.  Please do not use if you have an allergy to CHG or antibacterial soaps. If your skin becomes reddened/irritated stop using the CHG.  Do not shave (including legs and underarms) for at least 48 hours prior to first CHG shower. It is OK to shave your face.  Please follow these instructions carefully.   1. Shower the NIGHT BEFORE SURGERY and the MORNING OF SURGERY with CHG.   2. If you chose to wash your hair, wash your hair first as usual with your normal shampoo.  3. After you shampoo, rinse your hair and body thoroughly to remove the shampoo.  4. Use CHG as you would any other liquid soap. You can apply CHG directly to the skin and wash gently with a scrungie or a clean washcloth.   5. Apply the CHG Soap to your body ONLY FROM THE NECK DOWN.  Do not use on open wounds or open sores. Avoid contact with your eyes, ears, mouth and genitals (private parts). Wash Face and genitals (private parts)  with your normal soap.  6. Wash thoroughly, paying special attention to the area where your surgery will be performed.  7. Thoroughly rinse your body with warm water from the neck down.  8. DO NOT shower/wash with your normal soap after using and rinsing off the CHG Soap.  9. Pat yourself dry with a CLEAN TOWEL.  10. Wear CLEAN PAJAMAS to bed the night before surgery, wear comfortable clothes the morning of surgery  11. Place CLEAN SHEETS on your bed the night of your first shower and DO NOT SLEEP WITH PETS.  Day of Surgery: Do not apply any deodorants/lotions. Please wear clean clothes to the hospital/surgery center.    Please read over the following fact sheets that you were given. Pain Booklet, Coughing and Deep Breathing and Surgical Site Infection Prevention

## 2018-03-03 ENCOUNTER — Encounter (HOSPITAL_COMMUNITY)
Admission: RE | Admit: 2018-03-03 | Discharge: 2018-03-03 | Disposition: A | Discharge status: Home / Self Care | Payer: 59 | Source: Ambulatory Visit | Attending: General Surgery | Admitting: General Surgery

## 2018-03-03 ENCOUNTER — Encounter (HOSPITAL_COMMUNITY): Payer: Self-pay

## 2018-03-03 ENCOUNTER — Other Ambulatory Visit: Payer: Self-pay

## 2018-03-03 DIAGNOSIS — I10 Essential (primary) hypertension: Secondary | ICD-10-CM | POA: Diagnosis not present

## 2018-03-03 DIAGNOSIS — Z79891 Long term (current) use of opiate analgesic: Secondary | ICD-10-CM | POA: Insufficient documentation

## 2018-03-03 DIAGNOSIS — F17210 Nicotine dependence, cigarettes, uncomplicated: Secondary | ICD-10-CM | POA: Insufficient documentation

## 2018-03-03 DIAGNOSIS — E785 Hyperlipidemia, unspecified: Secondary | ICD-10-CM | POA: Insufficient documentation

## 2018-03-03 DIAGNOSIS — Z79899 Other long term (current) drug therapy: Secondary | ICD-10-CM | POA: Insufficient documentation

## 2018-03-03 DIAGNOSIS — Z01812 Encounter for preprocedural laboratory examination: Secondary | ICD-10-CM | POA: Diagnosis not present

## 2018-03-03 DIAGNOSIS — C50911 Malignant neoplasm of unspecified site of right female breast: Secondary | ICD-10-CM | POA: Diagnosis not present

## 2018-03-03 HISTORY — DX: Unspecified osteoarthritis, unspecified site: M19.90

## 2018-03-03 LAB — CBC WITH DIFFERENTIAL/PLATELET
Basophils Absolute: 0 10*3/uL (ref 0.0–0.1)
Basophils Relative: 1 %
EOS ABS: 0.1 10*3/uL (ref 0.0–0.7)
EOS PCT: 3 %
HCT: 32.8 % — ABNORMAL LOW (ref 36.0–46.0)
Hemoglobin: 10.4 g/dL — ABNORMAL LOW (ref 12.0–15.0)
LYMPHS ABS: 1.9 10*3/uL (ref 0.7–4.0)
Lymphocytes Relative: 44 %
MCH: 30.2 pg (ref 26.0–34.0)
MCHC: 31.7 g/dL (ref 30.0–36.0)
MCV: 95.3 fL (ref 78.0–100.0)
MONOS PCT: 15 %
Monocytes Absolute: 0.7 10*3/uL (ref 0.1–1.0)
Neutro Abs: 1.6 10*3/uL — ABNORMAL LOW (ref 1.7–7.7)
Neutrophils Relative %: 37 %
PLATELETS: 367 10*3/uL (ref 150–400)
RBC: 3.44 MIL/uL — ABNORMAL LOW (ref 3.87–5.11)
RDW: 19.8 % — AB (ref 11.5–15.5)
WBC: 4.3 10*3/uL (ref 4.0–10.5)

## 2018-03-03 LAB — COMPREHENSIVE METABOLIC PANEL
ALBUMIN: 3.5 g/dL (ref 3.5–5.0)
ALT: 14 U/L (ref 14–54)
ANION GAP: 14 (ref 5–15)
AST: 19 U/L (ref 15–41)
Alkaline Phosphatase: 84 U/L (ref 38–126)
BUN: 13 mg/dL (ref 6–20)
CALCIUM: 9.7 mg/dL (ref 8.9–10.3)
CHLORIDE: 99 mmol/L — AB (ref 101–111)
CO2: 25 mmol/L (ref 22–32)
Creatinine, Ser: 0.96 mg/dL (ref 0.44–1.00)
GFR calc non Af Amer: 59 mL/min — ABNORMAL LOW (ref >60)
GLUCOSE: 116 mg/dL — AB (ref 65–99)
POTASSIUM: 3.2 mmol/L — AB (ref 3.5–5.1)
SODIUM: 138 mmol/L (ref 135–145)
Total Bilirubin: 0.3 mg/dL (ref 0.3–1.2)
Total Protein: 6.7 g/dL (ref 6.5–8.1)

## 2018-03-03 NOTE — Progress Notes (Signed)
Anesthesia Chart Review:  Pt is a 67 year old female scheduled for R breast radioactive seed guided lumpectomy with R axillary radioactive seed targeted lymph node excision and sentinel lymph node biopsy, inject blue dye R breast on 03/08/2018 with [name], MD   - PCP is Precious Haws, MD (notes in care everywhere)  - Oncologist is Lurline Del, MD - [facility] cardiology care at [facility]; last office visit 04/06/17.  - Also sees Loralie Champagne, MD for cardio-oncology purposes. Last office visit 03/02/18  PMH includes:  HTN, hyperlipidemia, hypothyroidism, asthma, breast cancer. Current smoker. BMI 28.5. S/p port-a-cath insertion 09/25/17  Medications include: albuterol, alirocumab, amlodipine, levothyroxine, losartan-hctz, metoprolol, dulera, potassium  BP 126/65   Pulse 81   Temp 36.8 C   Resp 18   Ht 5' 3.5" (1.613 m)   Wt 162 lb 9.6 oz (73.8 kg)   SpO2 98%   BMI 28.35 kg/m     Preoperative labs reviewed.    EKG 10/13/17: Sinus rhythm. Multiple PVCs. Consider left atrial enlargement  Echo 03/01/18:  - Left ventricle: The cavity size was normal. Wall thickness was increased in a pattern of mild LVH. Systolic function was normal. The estimated ejection fraction was in the range of 60% to 65%. Features are consistent with a pseudonormal left ventricular filling pattern, with concomitant abnormal relaxation and increased filling pressure (grade 2 diastolic dysfunction). Strain does not appear to be tracking appropriately, not accurate. - Aortic valve: There was no stenosis. There was mild regurgitation. - Mitral valve: There was moderate regurgitation. - Left atrium: The atrium was mildly dilated. - Right ventricle: The cavity size was normal. Systolic function was normal. - Pulmonary arteries: No complete TR doppler jet so unable to estimate PA systolic pressure. - Inferior vena cava: The vessel was normal in size. The respirophasic diameter changes were in the  normal range (>= 50%), consistent with normal central venous pressure. - Impressions:  Normal LV size with mild LV hypertrophy, EF 60-65%. Moderate diastolic dysfunction. Strain is not accurate. Mild AI. Moderate mitral regurgitation. Normal RV size and systolic function.  Nuclear stress test 10/13/14 [facility] cardiovascular): 1. Resting EKG demonstrated NSR, normal resting conduction, and 1 mm ST depression in lateral leads. Stress EKG revealed 2.5 mm horizontal ST depression in leads: 2, 3, aVF, V4, V5, V6 consistent with myocardial ischemia. Stress symptoms were shortness of breath which resolved in recovery. 2. LV moderately dilatedwith LV diastolic volume 497 mL both the rest and stress images. Perfusion imaging study demonstrated moderate sized moderate to severe ischemia in the inferior and lateral wall extending from the base towards apex in the distribution of the circumflex coronary artery or a dominant right coronary artery. LV systolic function calculated by QGS was markedly depressed at 32% with inferior and lateral hypokinesis and global hypokinesis this represents high risk study. Consider further workup including cardiac catheterization if clinically indicated. - Office visit note 11/16/14 at [facility] cardiovascular documents results of echo and stress test were discussed with patient at length and the patient elected medical therapy instead of cardiac cath.   Pt has regular f/u with Dr. Aundra Dubin (cardio-oncology), most recently on 03/02/18. Nuclear stress test from 2015 showed depressed EF, inferior and lateral hypokinesis and moderate to severe ischemia (medical therapy elected), but recent echocardiograms have demonstrated normal LV systolic function and normal wall motion, EKG showing NSR.   If no changes, I anticipate pt can proceed with surgery as scheduled.   Willeen Cass, FNP-BC [facility] Short Stay Surgical  Center/Anesthesiology Phone: [phone] 03/04/2018 10:58 AM

## 2018-03-03 NOTE — Progress Notes (Signed)
PCP: Dr. Precious Haws @ [facility] in [place]  Cardiologist: Dr. Einar Gip and Dr. Loralie Champagne  Pt. Concerned of having the radioactive seed inserted tomorrow and having it 3 days prior to surgery and what if any precautions she needs to know.  Also states she doesn't take tylenol due to it causing headaches, and there is an order for tylenol in pre-op. Notified Dr. Alwyn Pea with nurse Selinda Eon, shared the above information with her. She stated they will get back in touch with pt.

## 2018-03-07 NOTE — H&P (Signed)
Tracie [name] Location: [facility] Surgery Patient #: [id number] DOB: [birth year] Divorced / Language: English / Race: Black or African American Female        History of Present Illness      This is a pleasant 67 year old female who returns following neoadjuvant chemotherapy to discuss definitive management of her locally advanced cancer of the right breast, upper outer quadrant. Her PCP is Sherian Rein. She was initially referred by Dr. Isaiah Blakes at [place]. Her cardiologist has been Dr. Einar Gip. Dr. Jana Hakim and Dr. Isidore Moos are involved in her care. She was initially evaluated in the [facility] On September 09, 2017.     No prior breast problems but no mammograms in 3 years. She noticed skin dimpling and felt a small lump in the upper outer right breast. Imaging showed a discrete mass in the far upper outer quadrant under the skin, 2.1 cm. Ultrasound showed 2 abnormal lymph nodes. Biopsy of the right breast mass shows invasive ductal carcinoma, receptor positive and HER-2 negative Ki-67 90% grade 3 Biopsy of the lymph node showed metastatic cancer but the lymph node was HER-2 positive. She underwent staging scans which suggested metastasis to T8 and she is subsequently had a kyphoplasty repair. There was some question about L1 and L2. PET scan shows no visceral disease. Minimal metabolic activity at T8. No other bone lesions on PET. Dr. Jana Hakim is treating her like stage IV disease. I inserted her Port-A-Cath Pretreatment MRI showed a 2.1 cm lobulated solid mass in the right breast upper outer quadrant, 16 cm from the nipple, 10 o'clock position. There was a 1.5 cm mass in the right axilla suspicious for lymph node involvement. A total of 3 suspicious lymph nodes were identified. Follow-up MRI on January 27, 2018 shows what appears to be a complete radiographic response. Comorbidities include asthma, hypertension, hypothyroidism. She was treated for angina variant  in the past. Her workup was otherwise negative. C-section. 2 stage colon resection for ruptured diverticulitis. Family history negative for breast or ovarian cancer. Mother died of metastatic rectal cancer. Social history reveals she is single, divorced, lives with her son in [place]. Works as a Radiation protection practitioner for the [employer] Smokes cigarettes. Alcohol occasional We had a long discussion about surgical management. She is ready to go ahead. We talked about mastectomy with or without reconstruction, targeted axillary dissection, lumpectomy. I told her I did not see any treatment or survival advantage to mastectomy and so we are going to pursue breast conservation. I think that is the right decision here      She'll be scheduled for injection of blue dye right breast, right breast lumpectomy with radioactive seed localization, possible excision of overlying skin, targeted right axillary lymph node excision with sentinel lymph node mapping and biopsy. I discussed the indications, details, techniques, and numerous risk of the surgery with her. She is aware of the risk of bleeding, infection, reoperation for positive margins or multiple positive nodes, cosmetic deformity, nerve damage with chronic pain, arm swelling, arm numbness. She understands all of these issues. All of her questions are answered. She agrees with this plan.       Her Port-A-Cath will be left in as she will need trastuzumab indefinitely    Allergies  No Known Drug Allergies  Allergies Reconciled   Medication History  AmLODIPine Besylate (10MG Tablet, Oral) Active. Cetirizine HCl (10MG Tablet, Oral) Active. Dulera (100-5MCG/ACT Aerosol, Inhalation) Active. Losartan Potassium-HCTZ (100-12.5MG Tablet, Oral) Active. Metoprolol Succinate ER (50MG Tablet ER  24HR, Oral) Active. Nitroglycerin (0.4MG Tab Sublingual, Sublingual) Active. Praluent (75MG/ML Soln Pen-inj, Subcutaneous)  Active. ProAir HFA (108 (90 Base)MCG/ACT Aerosol Soln, Inhalation) Active. Valsartan-Hydrochlorothiazide (320-12.5MG Tablet, Oral) Active. Prochlorperazine Maleate (10MG Tablet, Oral) Active. Potassium Chloride ER (10MEQ Tablet ER, Oral) Active. Medications Reconciled  Vitals  Weight: 163 lb Height: 63in Body Surface Area: 1.77 m Body Mass Index: 28.87 kg/m  Temp.: 98.57F  Pulse: 97 (Regular)  BP: 130/80 (Sitting, Left Arm, Standard)    Physical Exam General Mental Status-Alert. General Appearance-Consistent with stated age. Hydration-Well hydrated. Voice-Normal.  Head and Neck Head-normocephalic, atraumatic with no lesions or palpable masses. Trachea-midline. Thyroid Gland Characteristics - normal size and consistency.  Eye Eyeball - Bilateral-Extraocular movements intact. Sclera/Conjunctiva - Bilateral-No scleral icterus.  Chest and Lung Exam Chest and lung exam reveals -quiet, even and easy respiratory effort with no use of accessory muscles and on auscultation, normal breath sounds, no adventitious sounds and normal vocal resonance. Inspection Chest Wall - Normal. Back - normal.  Breast Note: Breasts are large. No palpable mass on either side. No skin changes. No axillary adenopathy.   Cardiovascular Cardiovascular examination reveals -normal heart sounds, regular rate and rhythm with no murmurs and normal pedal pulses bilaterally.  Abdomen Inspection Inspection of the abdomen reveals - No Hernias. Skin - Scar - Note: Well-healed lower midline incision. Palpation/Percussion Palpation and Percussion of the abdomen reveal - Soft, Non Tender, No Rebound tenderness, No Rigidity (guarding) and No hepatosplenomegaly. Auscultation Auscultation of the abdomen reveals - Bowel sounds normal.  Neurologic Neurologic evaluation reveals -alert and oriented x 3 with no impairment of recent or remote memory. Mental  Status-Normal.  Musculoskeletal Normal Exam - Left-Upper Extremity Strength Normal and Lower Extremity Strength Normal. Normal Exam - Right-Upper Extremity Strength Normal and Lower Extremity Strength Normal.  Lymphatic Head & Neck  General Head & Neck Lymphatics: Bilateral - Description - Normal. Axillary  General Axillary Region: Bilateral - Description - Normal. Tenderness - Non Tender. Femoral & Inguinal  Generalized Femoral & Inguinal Lymphatics: Bilateral - Description - Normal. Tenderness - Non Tender.    Assessment & Plan  PRIMARY CANCER OF UPPER OUTER QUADRANT OF RIGHT FEMALE BREAST (C50.411)   You have responded very well to your neoadjuvant chemotherapy The original tumor in your breast was 2.2 cm and the lymph nodes were abnormal Your recent MRI shows no abnormality in the breast or the lymph nodes. Hopefully, you have had a complete response  It is time to go ahead and schedule for definitive surgery We have discussed different options including lumpectomy, mastectomy, sentinel lymph node biopsy, targeted axillary dissection We have decided to proceed with right breast lumpectomy with radioactive seed localization, right axillary targeted deep lymph node biopsy, right axillary sentinel lymph node biopsy This will be an outpatient surgery and you may go home the same day  We have discussed the indications, techniques, and risk of the surgery in detail  you will need to continue the Herceptin chemotherapy indefinitely  PRIMARY MALIGNANT NEOPLASM OF RIGHT BREAST WITH METASTASIS TO MOVABLE IPSILATERAL LEVEL 1 OR 2 AXILLARY LYMPH NODES (N1) (C50.911) TOBACCO ABUSE (Z72.0) ANGINA PECTORIS (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 Dalbert Batman, M.D., [facility] General and Minimally invasive Surgery Breast and Colorectal Surgery Office:    [phone] Pager:   [phone]

## 2018-03-08 ENCOUNTER — Encounter (HOSPITAL_COMMUNITY)
Admission: RE | Admit: 2018-03-08 | Discharge: 2018-03-08 | Disposition: A | Discharge status: Home / Self Care | Payer: 59 | Source: Ambulatory Visit | Attending: General Surgery | Admitting: General Surgery

## 2018-03-08 ENCOUNTER — Ambulatory Visit (HOSPITAL_COMMUNITY): Payer: 59 | Admitting: Emergency Medicine

## 2018-03-08 ENCOUNTER — Ambulatory Visit (HOSPITAL_COMMUNITY): Payer: 59 | Admitting: Anesthesiology

## 2018-03-08 ENCOUNTER — Encounter (HOSPITAL_COMMUNITY)
Admission: RE | Disposition: A | Discharge status: Home / Self Care | Payer: Self-pay | Source: Ambulatory Visit | Attending: General Surgery

## 2018-03-08 ENCOUNTER — Encounter (HOSPITAL_COMMUNITY): Payer: Self-pay | Admitting: Certified Registered Nurse Anesthetist

## 2018-03-08 ENCOUNTER — Ambulatory Visit (HOSPITAL_COMMUNITY)
Admission: RE | Admit: 2018-03-08 | Discharge: 2018-03-08 | Disposition: A | Discharge status: Home / Self Care | Payer: 59 | Source: Ambulatory Visit | Attending: General Surgery | Admitting: General Surgery

## 2018-03-08 DIAGNOSIS — F17210 Nicotine dependence, cigarettes, uncomplicated: Secondary | ICD-10-CM | POA: Diagnosis not present

## 2018-03-08 DIAGNOSIS — Z9221 Personal history of antineoplastic chemotherapy: Secondary | ICD-10-CM | POA: Diagnosis not present

## 2018-03-08 DIAGNOSIS — C50411 Malignant neoplasm of upper-outer quadrant of right female breast: Secondary | ICD-10-CM | POA: Insufficient documentation

## 2018-03-08 DIAGNOSIS — I10 Essential (primary) hypertension: Secondary | ICD-10-CM | POA: Diagnosis not present

## 2018-03-08 DIAGNOSIS — Z79899 Other long term (current) drug therapy: Secondary | ICD-10-CM | POA: Diagnosis not present

## 2018-03-08 DIAGNOSIS — Z170 Estrogen receptor positive status [ER+]: Secondary | ICD-10-CM | POA: Diagnosis not present

## 2018-03-08 DIAGNOSIS — Z800 Family history of malignant neoplasm of digestive organs: Secondary | ICD-10-CM | POA: Diagnosis not present

## 2018-03-08 HISTORY — PX: BREAST LUMPECTOMY WITH RADIOACTIVE SEED AND SENTINEL LYMPH NODE BIOPSY: SHX6550

## 2018-03-08 SURGERY — BREAST LUMPECTOMY WITH RADIOACTIVE SEED AND SENTINEL LYMPH NODE BIOPSY
Anesthesia: General | Site: Breast | Laterality: Right

## 2018-03-08 MED ORDER — CHLORHEXIDINE GLUCONATE CLOTH 2 % EX PADS: 6.0000 | MEDICATED_PAD | Freq: Once | CUTANEOUS | Status: DC

## 2018-03-08 MED ORDER — PROPOFOL 10 MG/ML IV BOLUS
INTRAVENOUS | Status: AC
  Filled 2018-03-08: qty 20

## 2018-03-08 MED ORDER — MIDAZOLAM HCL 2 MG/2ML IJ SOLN
INTRAMUSCULAR | Status: AC
Start: 2018-03-08 — End: ?
  Filled 2018-03-08: qty 2

## 2018-03-08 MED ORDER — CEFAZOLIN SODIUM-DEXTROSE 2-4 GM/100ML-% IV SOLN
2.0000 g | INTRAVENOUS | Status: AC
  Administered 2018-03-08: 2 g via INTRAVENOUS
  Filled 2018-03-08: qty 100

## 2018-03-08 MED ORDER — BUPIVACAINE-EPINEPHRINE (PF) 0.5% -1:200000 IJ SOLN
INTRAMUSCULAR | Status: DC | PRN
  Administered 2018-03-08: 25 mL via PERINEURAL

## 2018-03-08 MED ORDER — TRAMADOL HCL 50 MG PO TABS: 50.0000 mg | ORAL_TABLET | Freq: Four times a day (QID) | ORAL | 0 refills | Status: DC | PRN

## 2018-03-08 MED ORDER — DEXAMETHASONE SODIUM PHOSPHATE 4 MG/ML IJ SOLN
INTRAMUSCULAR | Status: DC | PRN
  Administered 2018-03-08: 8 mg via INTRAVENOUS

## 2018-03-08 MED ORDER — ROCURONIUM BROMIDE 50 MG/5ML IV SOLN
INTRAVENOUS | Status: AC
  Filled 2018-03-08: qty 1

## 2018-03-08 MED ORDER — FENTANYL CITRATE (PF) 250 MCG/5ML IJ SOLN
INTRAMUSCULAR | Status: AC
  Filled 2018-03-08: qty 5

## 2018-03-08 MED ORDER — ROCURONIUM BROMIDE 100 MG/10ML IV SOLN
INTRAVENOUS | Status: DC | PRN
  Administered 2018-03-08: 40 mg via INTRAVENOUS
  Administered 2018-03-08: 10 mg via INTRAVENOUS

## 2018-03-08 MED ORDER — EPHEDRINE 5 MG/ML INJ
INTRAVENOUS | Status: AC
  Filled 2018-03-08: qty 10

## 2018-03-08 MED ORDER — 0.9 % SODIUM CHLORIDE (POUR BTL) OPTIME
TOPICAL | Status: DC | PRN
Start: 2018-03-08 — End: 2018-03-08
  Administered 2018-03-08: 1000 mL

## 2018-03-08 MED ORDER — METHYLENE BLUE 0.5 % INJ SOLN
INTRAVENOUS | Status: AC
  Filled 2018-03-08: qty 10

## 2018-03-08 MED ORDER — MIDAZOLAM HCL 2 MG/2ML IJ SOLN
INTRAMUSCULAR | Status: AC
  Administered 2018-03-08: 1 mg
  Filled 2018-03-08: qty 2

## 2018-03-08 MED ORDER — SODIUM CHLORIDE 0.9 % IJ SOLN
INTRAMUSCULAR | Status: AC
  Filled 2018-03-08: qty 10

## 2018-03-08 MED ORDER — BUPIVACAINE-EPINEPHRINE 0.5% -1:200000 IJ SOLN
INTRAMUSCULAR | Status: DC | PRN
  Administered 2018-03-08: 15 mL

## 2018-03-08 MED ORDER — EPHEDRINE SULFATE 50 MG/ML IJ SOLN
INTRAMUSCULAR | Status: DC | PRN
  Administered 2018-03-08 (×4): 5 mg via INTRAVENOUS

## 2018-03-08 MED ORDER — HYDROMORPHONE HCL 1 MG/ML IJ SOLN
0.2500 mg | INTRAMUSCULAR | Status: DC | PRN
  Administered 2018-03-08: 0.25 mg via INTRAVENOUS

## 2018-03-08 MED ORDER — GABAPENTIN 300 MG PO CAPS
300.0000 mg | ORAL_CAPSULE | ORAL | Status: AC
  Administered 2018-03-08: 300 mg via ORAL
  Filled 2018-03-08: qty 1

## 2018-03-08 MED ORDER — PHENYLEPHRINE 40 MCG/ML (10ML) SYRINGE FOR IV PUSH (FOR BLOOD PRESSURE SUPPORT)
PREFILLED_SYRINGE | INTRAVENOUS | Status: AC
  Filled 2018-03-08: qty 10

## 2018-03-08 MED ORDER — ACETAMINOPHEN 500 MG PO TABS
1000.0000 mg | ORAL_TABLET | ORAL | Status: AC
  Administered 2018-03-08: 1000 mg via ORAL
  Filled 2018-03-08: qty 2

## 2018-03-08 MED ORDER — LIDOCAINE HCL (CARDIAC) 20 MG/ML IV SOLN
INTRAVENOUS | Status: DC | PRN
  Administered 2018-03-08: 80 mg via INTRAVENOUS

## 2018-03-08 MED ORDER — ONDANSETRON HCL 4 MG/2ML IJ SOLN
INTRAMUSCULAR | Status: AC
  Filled 2018-03-08: qty 2

## 2018-03-08 MED ORDER — SODIUM CHLORIDE 0.9 % IJ SOLN
INTRAMUSCULAR | Status: DC | PRN
  Administered 2018-03-08: 12:00:00 via INTRAMUSCULAR

## 2018-03-08 MED ORDER — FENTANYL CITRATE (PF) 250 MCG/5ML IJ SOLN
INTRAMUSCULAR | Status: DC | PRN
  Administered 2018-03-08: 100 ug via INTRAVENOUS

## 2018-03-08 MED ORDER — LACTATED RINGERS IV SOLN
INTRAVENOUS | Status: DC
  Administered 2018-03-08: 10:00:00 via INTRAVENOUS

## 2018-03-08 MED ORDER — SUGAMMADEX SODIUM 200 MG/2ML IV SOLN
INTRAVENOUS | Status: AC
  Filled 2018-03-08: qty 2

## 2018-03-08 MED ORDER — TECHNETIUM TC 99M SULFUR COLLOID FILTERED
1.0000 | Freq: Once | INTRAVENOUS | Status: AC | PRN
  Administered 2018-03-08: 1 via INTRADERMAL

## 2018-03-08 MED ORDER — SUGAMMADEX SODIUM 200 MG/2ML IV SOLN
INTRAVENOUS | Status: DC | PRN
  Administered 2018-03-08: 175 mg via INTRAVENOUS

## 2018-03-08 MED ORDER — DEXAMETHASONE SODIUM PHOSPHATE 10 MG/ML IJ SOLN
INTRAMUSCULAR | Status: AC
  Filled 2018-03-08: qty 1

## 2018-03-08 MED ORDER — ONDANSETRON HCL 4 MG/2ML IJ SOLN
INTRAMUSCULAR | Status: DC | PRN
  Administered 2018-03-08: 4 mg via INTRAVENOUS

## 2018-03-08 MED ORDER — HYDROMORPHONE HCL 1 MG/ML IJ SOLN
INTRAMUSCULAR | Status: AC
  Filled 2018-03-08: qty 1

## 2018-03-08 MED ORDER — PROPOFOL 10 MG/ML IV BOLUS
INTRAVENOUS | Status: DC | PRN
  Administered 2018-03-08: 130 mg via INTRAVENOUS

## 2018-03-08 MED ORDER — PHENYLEPHRINE HCL 10 MG/ML IJ SOLN
INTRAMUSCULAR | Status: DC | PRN
  Administered 2018-03-08 (×3): 80 ug via INTRAVENOUS

## 2018-03-08 MED ORDER — FENTANYL CITRATE (PF) 100 MCG/2ML IJ SOLN
INTRAMUSCULAR | Status: AC
  Administered 2018-03-08: 100 ug
  Filled 2018-03-08: qty 2

## 2018-03-08 MED ORDER — CELECOXIB 200 MG PO CAPS
200.0000 mg | ORAL_CAPSULE | ORAL | Status: AC
  Administered 2018-03-08: 200 mg via ORAL
  Filled 2018-03-08: qty 1

## 2018-03-08 SURGICAL SUPPLY — 49 items
APPLIER CLIP 9.375 MED OPEN (MISCELLANEOUS) ×3
BINDER BREAST LRG (GAUZE/BANDAGES/DRESSINGS) IMPLANT
BINDER BREAST XLRG (GAUZE/BANDAGES/DRESSINGS) ×3 IMPLANT
BLADE SURG 15 STRL LF DISP TIS (BLADE) ×2 IMPLANT
BLADE SURG 15 STRL SS (BLADE) ×4
CANISTER SUCT 3000ML PPV (MISCELLANEOUS) ×3 IMPLANT
CHLORAPREP W/TINT 26ML (MISCELLANEOUS) ×3 IMPLANT
CLIP APPLIE 9.375 MED OPEN (MISCELLANEOUS) ×1 IMPLANT
CONT SPEC 4OZ CLIKSEAL STRL BL (MISCELLANEOUS) ×3 IMPLANT
COVER PROBE W GEL 5X96 (DRAPES) ×3 IMPLANT
COVER SURGICAL LIGHT HANDLE (MISCELLANEOUS) ×3 IMPLANT
DERMABOND ADVANCED (GAUZE/BANDAGES/DRESSINGS) ×2
DERMABOND ADVANCED .7 DNX12 (GAUZE/BANDAGES/DRESSINGS) ×1 IMPLANT
DEVICE DUBIN SPECIMEN MAMMOGRA (MISCELLANEOUS) ×3 IMPLANT
DRAPE CHEST BREAST 15X10 FENES (DRAPES) ×3 IMPLANT
DRAPE HALF SHEET 40X57 (DRAPES) ×3 IMPLANT
DRAPE UTILITY XL STRL (DRAPES) ×3 IMPLANT
DRSG PAD ABDOMINAL 8X10 ST (GAUZE/BANDAGES/DRESSINGS) ×3 IMPLANT
ELECT CAUTERY BLADE 6.4 (BLADE) ×3 IMPLANT
ELECT REM PT RETURN 9FT ADLT (ELECTROSURGICAL) ×3
ELECTRODE REM PT RTRN 9FT ADLT (ELECTROSURGICAL) ×1 IMPLANT
FILTER STRAW FLUID ASPIR (MISCELLANEOUS) IMPLANT
GAUZE SPONGE 4X4 12PLY STRL (GAUZE/BANDAGES/DRESSINGS) ×3 IMPLANT
GLOVE EUDERMIC 7 POWDERFREE (GLOVE) ×3 IMPLANT
GOWN STRL REUS W/ TWL LRG LVL3 (GOWN DISPOSABLE) ×1 IMPLANT
GOWN STRL REUS W/ TWL XL LVL3 (GOWN DISPOSABLE) ×1 IMPLANT
GOWN STRL REUS W/TWL LRG LVL3 (GOWN DISPOSABLE) ×2
GOWN STRL REUS W/TWL XL LVL3 (GOWN DISPOSABLE) ×2
ILLUMINATOR WAVEGUIDE N/F (MISCELLANEOUS) IMPLANT
KIT BASIN OR (CUSTOM PROCEDURE TRAY) ×3 IMPLANT
KIT MARKER MARGIN INK (KITS) ×6 IMPLANT
LIGHT WAVEGUIDE WIDE FLAT (MISCELLANEOUS) IMPLANT
NDL SAFETY ECLIPSE 18X1.5 (NEEDLE) IMPLANT
NEEDLE HYPO 18GX1.5 SHARP (NEEDLE)
NEEDLE HYPO 25GX1X1/2 BEV (NEEDLE) ×3 IMPLANT
NS IRRIG 1000ML POUR BTL (IV SOLUTION) ×3 IMPLANT
PACK SURGICAL SETUP 50X90 (CUSTOM PROCEDURE TRAY) ×3 IMPLANT
PENCIL BUTTON HOLSTER BLD 10FT (ELECTRODE) ×3 IMPLANT
SPONGE LAP 4X18 X RAY DECT (DISPOSABLE) ×3 IMPLANT
SUT MNCRL AB 4-0 PS2 18 (SUTURE) ×6 IMPLANT
SUT SILK 2 0 SH (SUTURE) ×3 IMPLANT
SUT VIC AB 3-0 SH 18 (SUTURE) ×3 IMPLANT
SYR BULB 3OZ (MISCELLANEOUS) ×3 IMPLANT
SYR CONTROL 10ML LL (SYRINGE) ×3 IMPLANT
TOWEL OR 17X24 6PK STRL BLUE (TOWEL DISPOSABLE) ×3 IMPLANT
TOWEL OR 17X26 10 PK STRL BLUE (TOWEL DISPOSABLE) ×3 IMPLANT
TUBE CONNECTING 12'X1/4 (SUCTIONS) ×1
TUBE CONNECTING 12X1/4 (SUCTIONS) ×2 IMPLANT
YANKAUER SUCT BULB TIP NO VENT (SUCTIONS) ×3 IMPLANT

## 2018-03-08 NOTE — Transfer of Care (Signed)
Immediate Anesthesia Transfer of Care Note  Patient: Tracie [name]  Procedure(s) Performed: RIGHT BREAST RADIOACTIVE SEED GUIDED LUMPECTOMY WITH RIGHT AXILLARY RADIOACTIVE SEED TARGETED LYMPH NODE EXCISION AND SENTINEL LYMPH NODE BIOPSY, INJECT BLUE DYE RIGHT BREAST (Right Breast)  Patient Location: PACU  Anesthesia Type:General  Level of Consciousness: awake, oriented and patient cooperative  Airway & Oxygen Therapy: Patient Spontanous Breathing and Patient connected to nasal cannula oxygen  Post-op Assessment: Report given to RN and Post -op Vital signs reviewed and stable  Post vital signs: Reviewed  Last Vitals:  Vitals:   03/08/18 0946  BP: 123/61  Pulse: 70  Resp: 18  Temp: 36.8 C  SpO2: 100%    Last Pain:  Vitals:   03/08/18 0946  TempSrc: Oral         Complications: No apparent anesthesia complications

## 2018-03-08 NOTE — Anesthesia Procedure Notes (Signed)
Procedure Name: Intubation Date/Time: 03/08/2018 11:34 AM Performed by: Jenne Campus, CRNA Pre-anesthesia Checklist: Patient identified, Emergency Drugs available, Suction available and Patient being monitored Patient Re-evaluated:Patient Re-evaluated prior to induction Oxygen Delivery Method: Circle System Utilized Preoxygenation: Pre-oxygenation with 100% oxygen Induction Type: IV induction Ventilation: Mask ventilation without difficulty Laryngoscope Size: [name] and 2 Grade View: Grade I Tube type: Oral Tube size: 7.5 mm Number of attempts: 1 Airway Equipment and Method: Stylet and Oral airway Placement Confirmation: ETT inserted through vocal cords under direct vision,  positive ETCO2 and breath sounds checked- equal and bilateral Secured at: 23 cm Tube secured with: Tape Dental Injury: Teeth and Oropharynx as per pre-operative assessment

## 2018-03-08 NOTE — Op Note (Signed)
Patient Name:           Tracie [name]   Date of Surgery:        03/08/2018  Pre op Diagnosis:    Invasive cancer right breast, upper outer quadrant.  Estrogen receptor positive, HER-2 negative                                      status post neoadjuvant chemotherapy  Post op Diagnosis:    same  Procedure:                 Inject blue dye right breast                                      Right breast lumpectomy with radioactive seed localization                                      Targeted right axillary deep lymph node excisional biopsy with radioactive seed localization                                       Right axillary sentinel lymph node mapping and biopsy  Surgeon:                     Edsel Petrin. Dalbert Batman, M.D., FACS  Assistant:                      OR staff  Operative Indications:   This is a pleasant 67 year old female who is brought to the operating room following neoadjuvant chemotherapy for definitive surgical management of her right breast cancer.. Her PCP is Sherian Rein.  Her cardiologist has been Dr. Einar Gip. Dr. Jana Hakim and Dr. Isidore Moos are involved in her care. She was initially evaluated in the [facility] On September 09, 2017.     No prior breast problems but no mammograms in 3 years. She noticed skin dimpling and felt a small lump in the upper outer right breast. Imaging showed a discrete mass in the far upper outer quadrant under the skin, 2.1 cm. Ultrasound showed 2 abnormal lymph nodes. Biopsy of the right breast mass shows invasive ductal carcinoma, receptor positive and HER-2 negative Ki-67 90% grade 3 Biopsy of the lymph node showed metastatic cancer but the lymph node was HER-2 positive. She underwent staging scans which suggested metastasis to T8 and she is subsequently had a kyphoplasty repair. There was some question about L1 and L2. PET scan shows no visceral disease. Minimal metabolic activity at T8. No other bone lesions on PET. Dr. Jana Hakim is treating her  like stage IV disease. I inserted her Port-A-Cath Pretreatment MRI showed a 2.1 cm lobulated solid mass in the right breast upper outer quadrant, 16 cm from the nipple, 10 o'clock position. There was a 1.5 cm mass in the right axilla suspicious for lymph node involvement. A total of 3 suspicious lymph nodes were identified. Follow-up MRI on January 27, 2018 shows what appears to be a complete radiographic response. We had a long discussion about surgical management.      She'll be scheduled for injection of blue dye right  breast, right breast lumpectomy with radioactive seed localization, possible excision of overlying skin, targeted right axillary lymph node excision and deep sentinel lymph node mapping and biopsy. I discussed the indications, details, techniques, and numerous risk of the surgery with her. She agrees with this plan.       Her Port-A-Cath will be left in as she will need trastuzumab indefinitely    Operative Findings:      Imaging studies showed no mass and no enlargement of the lymph nodes.  The marker clip and the radioactive seeds from the breast cancer were in the far lateral right breast and they were only 2.5 cm away from the targeted axillary node.  I reviewed this with Dr. Luan Pulling at [facility] imaging ahead of time.  I was able to perform the lumpectomy, the targeted node biopsy, and sentinel node biopsy through a single transverse elliptical incision.  I removed an ellipse of skin because of the superficial nature of the cancerThe specimen mammogram was good and contained the area of the cancer and the targeted node.  In addition I found a solitary sentinel lymph node.  There were no other palpably abnormal lymph nodes and there was no more radioactivity or blue dye in the axilla.  Procedure in Detail:          Pectoral block was performed in the holding area.  Technetium 99 was injected in the holding area by the nuclear medicine technician.  The patient was taken  to the operating room and underwent general endotracheal anesthesia.  Intravenous antibiotics were given.  Surgical timeout was performed.  Following alcohol prep I injected 5 mL of blue dye into the right breast, subareolar area and massaged the breast for a few minutes.       The patient's right chest wall and axilla were then prepped and draped in a sterile fashion.  Intravenous antibiotics were given.  0.5% Marcaine with epinephrine was used as a local infiltration anesthetic.  I used the neoprobe to map out the cancer and the targeted node which were fairly close to each other.  I made a transverse, radially oriented elliptical incision.  The incision was made with the knife.  I performed the lumpectomy with neoprobe and cautery.  Turned out that I could remove the cancer and the targeted axillary node in a single specimen.  The specimen mammogram looked good and contained both the radioactive seeds.      I then used the neoprobe on the technetium setting and found 1 sentinel lymph node that was very hot and very blue.  After this was removed there was no more radioactivity or blue dye and there was no palpable abnormality.      The wound was irrigated.  Hemostasis was excellent.  5 metallic clips were placed in the walls of the lumpectomy cavity.  Lumpectomy cavity was closed transversely with multiple layers of 3-0 Vicryl and the skin closed with a running subcuticular 4-0 Monocryl and Dermabond.  Breast binder was placed and the patient taken to PACU in stable condition.  EBL 20 mL or less.  Counts correct.  Complications none.    Addendum: I logged onto the [employer] and reviewed her prescription medication history.    Edsel Petrin. Dalbert Batman, M.D., FACS General and Minimally Invasive Surgery Breast and Colorectal Surgery  03/08/2018 12:50 PM

## 2018-03-08 NOTE — Anesthesia Preprocedure Evaluation (Addendum)
Anesthesia Evaluation  Patient identified by MRN, date of birth, ID band Patient awake    Reviewed: Allergy & Precautions, H&P , NPO status , Patient's Chart, lab work & pertinent test results, reviewed documented beta blocker date and time   Airway Mallampati: II  TM Distance: >3 FB Neck ROM: Full    Dental no notable dental hx. (+) Edentulous Upper, Missing, Dental Advisory Given   Pulmonary asthma , Current Smoker,    Pulmonary exam normal breath sounds clear to auscultation       Cardiovascular hypertension, Pt. on medications + angina Normal cardiovascular exam Rhythm:Regular Rate:Normal     Neuro/Psych  Headaches, negative neurological ROS  negative psych ROS   GI/Hepatic negative GI ROS, Neg liver ROS,   Endo/Other  Hypothyroidism   Renal/GU negative Renal ROS     Musculoskeletal negative musculoskeletal ROS (+)   Abdominal   Peds  Hematology negative hematology ROS (+)   Anesthesia Other Findings   Reproductive/Obstetrics negative OB ROS                            Anesthesia Physical  Anesthesia Plan  ASA: II  Anesthesia Plan: General   Post-op Pain Management:    Induction: Intravenous  PONV Risk Score and Plan: 2 and Ondansetron and Dexamethasone  Airway Management Planned: Oral ETT  Additional Equipment:   Intra-op Plan:   Post-operative Plan: Extubation in OR  Informed Consent: I have reviewed the patients History and Physical, chart, labs and discussed the procedure including the risks, benefits and alternatives for the proposed anesthesia with the patient or authorized representative who has indicated his/her understanding and acceptance.   Dental advisory given  Plan Discussed with: CRNA  Anesthesia Plan Comments: (  )        Anesthesia Quick Evaluation

## 2018-03-08 NOTE — Anesthesia Procedure Notes (Signed)
Anesthesia Regional Block: Pectoralis block   Pre-Anesthetic Checklist: ,, timeout performed, Correct Patient, Correct Site, Correct Laterality, Correct Procedure, Correct Position, site marked, Risks and benefits discussed, pre-op evaluation,  At surgeon's request and post-op pain management  Laterality: Right  Prep: chloraprep       Needles:   Needle Type: Echogenic Needle     Needle Length: 9cm  Needle Gauge: 21     Additional Needles:   Procedures:,,,, ultrasound used (permanent image in chart),,,,  Narrative:  Start time: 03/08/2018 10:19 AM End time: 03/08/2018 10:25 AM Injection made incrementally with aspirations every 5 mL. Anesthesiologist: Lyndle Herrlich, MD

## 2018-03-08 NOTE — Interval H&P Note (Signed)
History and Physical Interval Note:  03/08/2018 9:53 AM  Tracie [name]  has presented today for surgery, with the diagnosis of RIGHT BREAST CANCER  The various methods of treatment have been discussed with the patient and family. After consideration of risks, benefits and other options for treatment, the patient has consented to  Procedure(s): RIGHT BREAST RADIOACTIVE SEED GUIDED LUMPECTOMY WITH RIGHT AXILLARY RADIOACTIVE SEED TARGETED LYMPH NODE EXCISION AND SENTINEL LYMPH NODE BIOPSY, INJECT BLUE DYE RIGHT BREAST (Right) as a surgical intervention .  The patient's history has been reviewed, patient examined, no change in status, stable for surgery.  I have reviewed the patient's chart and labs.  Questions were answered to the patient's satisfaction.     Adin Hector

## 2018-03-09 ENCOUNTER — Inpatient Hospital Stay: Payer: 59

## 2018-03-09 ENCOUNTER — Telehealth: Payer: Self-pay | Admitting: Oncology

## 2018-03-09 ENCOUNTER — Inpatient Hospital Stay: Payer: 59 | Attending: Oncology

## 2018-03-09 ENCOUNTER — Inpatient Hospital Stay (HOSPITAL_BASED_OUTPATIENT_CLINIC_OR_DEPARTMENT_OTHER): Payer: 59 | Admitting: Oncology

## 2018-03-09 ENCOUNTER — Encounter (HOSPITAL_COMMUNITY): Payer: Self-pay | Admitting: General Surgery

## 2018-03-09 VITALS — BP 120/70 | HR 72 | Temp 98.5°F | Resp 18 | Ht 63.5 in | Wt 163.9 lb

## 2018-03-09 DIAGNOSIS — C773 Secondary and unspecified malignant neoplasm of axilla and upper limb lymph nodes: Secondary | ICD-10-CM | POA: Diagnosis not present

## 2018-03-09 DIAGNOSIS — C50411 Malignant neoplasm of upper-outer quadrant of right female breast: Secondary | ICD-10-CM

## 2018-03-09 DIAGNOSIS — Z170 Estrogen receptor positive status [ER+]: Principal | ICD-10-CM

## 2018-03-09 DIAGNOSIS — Z5112 Encounter for antineoplastic immunotherapy: Secondary | ICD-10-CM | POA: Insufficient documentation

## 2018-03-09 DIAGNOSIS — J432 Centrilobular emphysema: Secondary | ICD-10-CM

## 2018-03-09 DIAGNOSIS — T451X5A Adverse effect of antineoplastic and immunosuppressive drugs, initial encounter: Secondary | ICD-10-CM

## 2018-03-09 DIAGNOSIS — G620 Drug-induced polyneuropathy: Secondary | ICD-10-CM

## 2018-03-09 LAB — CBC WITH DIFFERENTIAL/PLATELET
BASOS ABS: 0.1 10*3/uL (ref 0.0–0.1)
Basophils Relative: 1 %
EOS ABS: 0 10*3/uL (ref 0.0–0.5)
Eosinophils Relative: 0 %
HCT: 30.6 % — ABNORMAL LOW (ref 34.8–46.6)
Hemoglobin: 10.2 g/dL — ABNORMAL LOW (ref 11.6–15.9)
Lymphocytes Relative: 16 %
Lymphs Abs: 1.6 10*3/uL (ref 0.9–3.3)
MCH: 31 pg (ref 25.1–34.0)
MCHC: 33.2 g/dL (ref 31.5–36.0)
MCV: 93.2 fL (ref 79.5–101.0)
MONO ABS: 1 10*3/uL — AB (ref 0.1–0.9)
Monocytes Relative: 11 %
NEUTROS ABS: 6.9 10*3/uL — AB (ref 1.5–6.5)
Neutrophils Relative %: 72 %
PLATELETS: 378 10*3/uL (ref 145–400)
RBC: 3.28 MIL/uL — ABNORMAL LOW (ref 3.70–5.45)
RDW: 21.9 % — AB (ref 11.2–14.5)
WBC: 9.6 10*3/uL (ref 3.9–10.3)

## 2018-03-09 LAB — COMPREHENSIVE METABOLIC PANEL WITH GFR
ALT: 13 U/L (ref 0–55)
AST: 15 U/L (ref 5–34)
Albumin: 3.4 g/dL — ABNORMAL LOW (ref 3.5–5.0)
Alkaline Phosphatase: 86 U/L (ref 40–150)
Anion gap: 10 (ref 3–11)
BUN: 13 mg/dL (ref 7–26)
CO2: 27 mmol/L (ref 22–29)
Calcium: 10 mg/dL (ref 8.4–10.4)
Chloride: 100 mmol/L (ref 98–109)
Creatinine, Ser: 0.82 mg/dL (ref 0.60–1.10)
GFR calc Af Amer: >60 mL/min
GFR calc non Af Amer: >60 mL/min
Glucose, Bld: 111 mg/dL (ref 70–140)
Potassium: 3.3 mmol/L — ABNORMAL LOW (ref 3.5–5.1)
Sodium: 137 mmol/L (ref 136–145)
Total Bilirubin: <0.2 mg/dL — ABNORMAL LOW (ref 0.2–1.2)
Total Protein: 6.8 g/dL (ref 6.4–8.3)

## 2018-03-09 MED ORDER — HEPARIN SOD (PORK) LOCK FLUSH 100 UNIT/ML IV SOLN
500.0000 [IU] | Freq: Once | INTRAVENOUS | Status: AC | PRN
  Administered 2018-03-09: 500 [IU]
  Filled 2018-03-09: qty 5

## 2018-03-09 MED ORDER — SODIUM CHLORIDE 0.9% FLUSH
10.0000 mL | Freq: Once | INTRAVENOUS | Status: AC
  Administered 2018-03-09: 10 mL
  Filled 2018-03-09: qty 10

## 2018-03-09 MED ORDER — DIPHENHYDRAMINE HCL 25 MG PO CAPS
ORAL_CAPSULE | ORAL | Status: AC
  Filled 2018-03-09: qty 1

## 2018-03-09 MED ORDER — SODIUM CHLORIDE 0.9 % IV SOLN
Freq: Once | INTRAVENOUS | Status: AC
  Administered 2018-03-09: 11:00:00 via INTRAVENOUS

## 2018-03-09 MED ORDER — ACETAMINOPHEN 325 MG PO TABS
650.0000 mg | ORAL_TABLET | Freq: Once | ORAL | Status: AC
  Administered 2018-03-09: 650 mg via ORAL

## 2018-03-09 MED ORDER — ACETAMINOPHEN 325 MG PO TABS
ORAL_TABLET | ORAL | Status: AC
  Filled 2018-03-09: qty 2

## 2018-03-09 MED ORDER — SODIUM CHLORIDE 0.9% FLUSH
10.0000 mL | INTRAVENOUS | Status: DC | PRN
  Administered 2018-03-09: 10 mL
  Filled 2018-03-09: qty 10

## 2018-03-09 MED ORDER — DIPHENHYDRAMINE HCL 25 MG PO CAPS
25.0000 mg | ORAL_CAPSULE | Freq: Once | ORAL | Status: AC
  Administered 2018-03-09: 25 mg via ORAL

## 2018-03-09 MED ORDER — TRASTUZUMAB CHEMO 150 MG IV SOLR
450.0000 mg | Freq: Once | INTRAVENOUS | Status: AC
  Administered 2018-03-09: 450 mg via INTRAVENOUS
  Filled 2018-03-09: qty 21.43

## 2018-03-09 NOTE — Patient Instructions (Signed)
[facility] Discharge Instructions for Patients Receiving Chemotherapy  Today you received the following chemotherapy agents Herceptin  To help prevent nausea and vomiting after your treatment, we encourage you to take your nausea medication as directed   If you develop nausea and vomiting that is not controlled by your nausea medication, call the clinic.   BELOW ARE SYMPTOMS THAT SHOULD BE REPORTED IMMEDIATELY:  *FEVER GREATER THAN 100.5 F  *CHILLS WITH OR WITHOUT FEVER  NAUSEA AND VOMITING THAT IS NOT CONTROLLED WITH YOUR NAUSEA MEDICATION  *UNUSUAL SHORTNESS OF BREATH  *UNUSUAL BRUISING OR BLEEDING  TENDERNESS IN MOUTH AND THROAT WITH OR WITHOUT PRESENCE OF ULCERS  *URINARY PROBLEMS  *BOWEL PROBLEMS  UNUSUAL RASH Items with * indicate a potential emergency and should be followed up as soon as possible.  Feel free to call the clinic should you have any questions or concerns. The clinic phone number is [phone].  Please show the CHEMO ALERT CARD at check-in to the Emergency Department and triage nurse.   

## 2018-03-09 NOTE — Progress Notes (Signed)
Inform patient of Pathology report,. Breast pathology. She has a tiny residual focus of cancer. Nodes negative.  No further surgery needed. Good news.  Let me know you reached her.  Thanks. hmi

## 2018-03-09 NOTE — Telephone Encounter (Signed)
Gave patient AVs and calendar of upcoming April through June appointments.  °

## 2018-03-09 NOTE — Progress Notes (Signed)
Tracie [name]  [phone] [phone]     ID: Tracie [name] DOB: 1951/12/03  MR#: [id number]  TGY#:[id number]  Patient Care Team: Bartholome Bill, MD as PCP - General (Family Medicine) [name], MD as Consulting Physician (General Surgery) Magrinat, [name], MD as Consulting Physician (Oncology) [name], MD as Attending Physician (Radiation Oncology) OTHER MD:  CHIEF COMPLAINT: Triple positive breast cancer  CURRENT TREATMENT: trastuzumab; adjuvant radiation pending   HISTORY OF CURRENT ILLNESS: From the original intake note:  Tracie [name] noted a change in her right breast sometime in July 2018. She called the [facility] to report that she had found a "kernel" in her breast. She was advised to see her primary physician which she did. The patient was then set up for bilateral diagnostic mammography with tomography and right breast ultrasonography at [facility] 09/03/2017. The breast density was category I a. In the upper outer quadrant of the right breast there was a 2.1 cm high density mass, which was palpable. Also in the right breast more posteriorly there was a 1.5 cm high density mass which was felt to be a suspicious lymph node. Ultrasound of the right breast confirmed a 2.1 centimeter lobulated solid mass in the right breast upper outer quadrant 15.8 cm from the nipple in the 10:00 radiant. There was a 1.5 cm oval mass in the right axillary tail with a small rounded masses also suspicious for lymph node involvement. A total of 3 suspicious lymph nodes were identified.  On 09/03/2017 the patient underwent biopsy of the breast mass and the suspicious right axillary lymph node. The final pathology ([id number]) found invasive ductal carcinoma, grade 3, in both. Both tumors were estrogen receptor positive at 70-75%, and both were progesterone receptor negative. Both had an elevated proliferation marker at 90%. The mass in the breast was  HER-2 negative, with a signals ratio of 1.25 and the number per cell 1.88. The lymph node mass however was HER-2 positive with a signals ratio of 2.57, and the number per cell 3.60.  The patient's subsequent history is as detailed below.  INTERVAL HISTORY: Tracie [name] returns today for follow-up and treatment of her triple positive breast cancer. She receives trastuzumab every 21 days, with a dose due today.  She tolerated this well. She denies any side affects from this so far.   Her echocardiogram on 03/01/2018 shows an ejection fraction in the 60-65% range.  She underwent right lumpectomy and sentinel lymph node sampling yesterday (!!!) with results pending.      REVIEW OF SYSTEMS: Tracie [name] reports that she never accepted her breast cancer diagnosis, but she is trying to get through her treatments.  She has had essentially no pain postop, likely due to the nerve block, and she does have tramadol available if that fades.. She has a small right breast twinge pain. She is planning to see radiation soon to discuss beginning radiation treatments. She is concerned how this will affect her work schedule. 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 She denies neuropathy in her feet, but her big toe nail fell off. She denies unusual headaches, visual changes, nausea, vomiting, or dizziness. There has been no unusual cough, phlegm production, or pleurisy. This been no change in bowel or bladder habits. She denies unexplained fatigue or unexplained weight loss, bleeding, rash, or fever. A detailed review of systems was otherwise stable.      PAST MEDICAL HISTORY: Past Medical History:  Diagnosis  Date  . Angina pectoris ([place])    history of   . Arthritis   . Asthma   . Cancer ([place])    right breast  . Constipation   . Diverticulitis   . HPV in female   . Hyperlipidemia   . Hypertension   . Hypothyroidism   . Wears dentures   . Wears glasses     PAST SURGICAL  HISTORY: Past Surgical History:  Procedure Laterality Date  . CESAREAN SECTION    . COLON SURGERY  2012   sigmoidectomy  . COLOSTOMY    . COLOSTOMY TAKEDOWN  10/06/11  . IR BONE TUMOR(S)RF ABLATION  11/02/2017  . IR KYPHO THORACIC WITH BONE BIOPSY  11/02/2017  . IR RADIOLOGIST EVAL & MGMT  10/07/2017  . IR RADIOLOGIST EVAL & MGMT  11/25/2017  . PORTACATH PLACEMENT N/A 09/25/2017   Procedure: INSERTION PORT-A-CATH WITH ULTRA SOUND ERAS PATHWAY;  Surgeon: [name], MD;  Location: [facility];  Service: General;  Laterality: N/A;  . TONSILLECTOMY    . TUBAL LIGATION      FAMILY HISTORY Family History  Problem Relation Age of Onset  . Cancer Mother        rectal  . Hypertension Brother   . Hypertension Brother   . Asthma Son   . Asthma Brother    The patient has little information regarding her father. Her mother died at age 40 from a strange cancer--the patient does not know what it was, it may well have been cervical cancer from her description. The patient has one brother, no sisters. There is no history of breast or ovarian cancer in the family to the patient's knowledge   GYNECOLOGIC HISTORY:  No LMP recorded. Patient is postmenopausal.  menarche age 31, first live birth age 44 the patient is [name] P3. She stopped having periods at age 38. She never used oral contraceptives or hormone replacement.   SOCIAL HISTORY:  Works as a Scientist, product/process development. She is divorced. Currently her son Tracie [name] lives with her. He is a Art gallery manager. The 2 other children are Tracie [name], who lives in [place] and works in the theater and media, and Tracie [name] lives in [place], and is vice president of a [employer]. The patient has 5 grandchildren. She is a Psychologist, forensic.    ADVANCED DIRECTIVES: Not in place    HEALTH MAINTENANCE: Social History   Tobacco Use  . Smoking status: Current Some Day Smoker    Packs/day: 0.25    Years: 40.00    Pack years: 10.00    Types:  Cigarettes  . Smokeless tobacco: Never Used  . Tobacco comment: Pt already has info  Substance Use Topics  . Alcohol use: Yes    Comment: occasional  . Drug use: No     Colonoscopy: September 2012   PAP:  Bone density:09/03/2017    Allergies  Allergen Reactions  . Fish Allergy Anaphylaxis and Swelling    Swelling of hands and feet.  . Other Anaphylaxis    Tree nuts   . Ace Inhibitors Other (See Comments) and Cough    Cold like symptoms   . Tylenol [Acetaminophen] Other (See Comments)    Headache     Current Outpatient Medications  Medication Sig Dispense Refill  . albuterol (VENTOLIN HFA) 108 (90 BASE) MCG/ACT inhaler Inhale 1-2 puffs into the lungs every 6 (six) hours as needed for wheezing or shortness of breath. (Patient taking differently: Inhale 2 puffs into  the lungs every 6 (six) hours as needed for wheezing or shortness of breath. ) 1 Inhaler 6  . Alirocumab (PRALUENT) 75 MG/ML SOPN Inject 75 mg into the muscle every 14 (fourteen) days. 6 pen 0  . amLODipine (NORVASC) 10 MG tablet Take 10 mg by mouth daily.     . cetirizine (ZYRTEC) 10 MG tablet Take 10 mg by mouth daily as needed for allergies.   11  . ibuprofen (ADVIL,MOTRIN) 200 MG tablet Take 400 mg by mouth every 6 (six) hours as needed for headache or moderate pain.    Marland Kitchen levothyroxine (SYNTHROID, LEVOTHROID) 50 MCG tablet Take 50 mcg by mouth daily.    Marland Kitchen lidocaine-prilocaine (EMLA) cream Apply 1 application topically as needed (port access).    Marland Kitchen losartan-hydrochlorothiazide (HYZAAR) 100-12.5 MG tablet Take 1 tablet by mouth daily.   3  . metoprolol succinate (TOPROL-XL) 50 MG 24 hr tablet Take 1 tablet (50 mg total) by mouth daily. 90 tablet 0  . mometasone-formoterol (DULERA) 100-5 MCG/ACT AERO Inhale 2 puffs into the lungs 2 (two) times daily. 3 Inhaler 0  . Naphazoline-Glycerin (REDNESS RELIEF OP) Place 1 drop into both eyes daily as needed (redness).    . nitroGLYCERIN (NITROSTAT) 0.4 MG SL tablet Place 0.4 mg  under the tongue every 5 (five) minutes as needed for chest pain.    . potassium chloride (K-DUR) 10 MEQ tablet Take 2 tablets (20 mEq total) by mouth daily. 60 tablet 0  . traMADol (ULTRAM) 50 MG tablet Take 1 tablet (50 mg total) by mouth every 8 (eight) hours as needed (pain). 30 tablet 0  . traMADol (ULTRAM) 50 MG tablet Take 1 tablet (50 mg total) by mouth every 6 (six) hours as needed for moderate pain or severe pain. 30 tablet 0   No current facility-administered medications for this visit.     OBJECTIVE: Middle-aged African-American woman in no acute distress  Vitals:   03/09/18 1005  BP: 120/70  Pulse: 72  Resp: 18  Temp: 98.5 F (36.9 C)  SpO2: 97%     Body mass index is 28.58 kg/m.   Wt Readings from Last 3 Encounters:  03/09/18 163 lb 14.4 oz (74.3 kg)  03/08/18 162 lb (73.5 kg)  03/03/18 162 lb 9.6 oz (73.8 kg)   ECOG FS:1 - Symptomatic but completely ambulatory   Sclerae unicteric, EOMs intact Oropharynx clear and moist No cervical or supraclavicular adenopathy Lungs no rales or rhonchi Heart regular rate and rhythm Abd soft, nontender, positive bowel sounds MSK no focal spinal tenderness, no upper extremity lymphedema Neuro: nonfocal, well oriented, appropriate affect Breasts: The right breast is status post surgery yesterday.  There is no erythema or dehiscence.  The incision is intact.  There is an area in the more medial aspect which may have a subjacent seroma, but it may simply be fat irregularity.  The right axilla is benign.  The left breast and left axilla are benign.    LAB RESULTS:  CMP     Component Value Date/Time   NA 138 03/03/2018 0848   NA 138 12/24/2017 0752   K 3.2 (L) 03/03/2018 0848   K 3.3 (L) 12/24/2017 0752   CL 99 (L) 03/03/2018 0848   CO2 25 03/03/2018 0848   CO2 25 12/24/2017 0752   GLUCOSE 116 (H) 03/03/2018 0848   GLUCOSE 99 12/24/2017 0752   BUN 13 03/03/2018 0848   BUN 7.9 12/24/2017 0752   CREATININE 0.96 03/03/2018  0848   CREATININE  0.7 12/24/2017 0752   CALCIUM 9.7 03/03/2018 0848   CALCIUM 9.2 12/24/2017 0752   PROT 6.7 03/03/2018 0848   PROT 6.3 (L) 12/24/2017 0752   ALBUMIN 3.5 03/03/2018 0848   ALBUMIN 3.4 (L) 12/24/2017 0752   AST 19 03/03/2018 0848   AST 15 12/24/2017 0752   ALT 14 03/03/2018 0848   ALT 18 12/24/2017 0752   ALKPHOS 84 03/03/2018 0848   ALKPHOS 93 12/24/2017 0752   BILITOT 0.3 03/03/2018 0848   BILITOT <0.22 12/24/2017 0752   GFRNONAA 59 (L) 03/03/2018 0848   GFRAA >60 03/03/2018 0848    No results found for: TOTALPROTELP, ALBUMINELP, A1GS, A2GS, BETS, BETA2SER, GAMS, MSPIKE, SPEI  No results found for: Nils Pyle, [facility]  Lab Results  Component Value Date   WBC 9.6 03/09/2018   NEUTROABS 6.9 (H) 03/09/2018   HGB 10.2 (L) 03/09/2018   HCT 30.6 (L) 03/09/2018   MCV 93.2 03/09/2018   PLT 378 03/09/2018      Chemistry      Component Value Date/Time   NA 138 03/03/2018 0848   NA 138 12/24/2017 0752   K 3.2 (L) 03/03/2018 0848   K 3.3 (L) 12/24/2017 0752   CL 99 (L) 03/03/2018 0848   CO2 25 03/03/2018 0848   CO2 25 12/24/2017 0752   BUN 13 03/03/2018 0848   BUN 7.9 12/24/2017 0752   CREATININE 0.96 03/03/2018 0848   CREATININE 0.7 12/24/2017 0752      Component Value Date/Time   CALCIUM 9.7 03/03/2018 0848   CALCIUM 9.2 12/24/2017 0752   ALKPHOS 84 03/03/2018 0848   ALKPHOS 93 12/24/2017 0752   AST 19 03/03/2018 0848   AST 15 12/24/2017 0752   ALT 14 03/03/2018 0848   ALT 18 12/24/2017 0752   BILITOT 0.3 03/03/2018 0848   BILITOT <0.22 12/24/2017 0752       No results found for: LABCA2  No components found for: [id number]    Appointment on 03/09/2018  Component Date Value Ref Range Status  . WBC 03/09/2018 9.6  3.9 - 10.3 K/uL Final  . RBC 03/09/2018 3.28* 3.70 - 5.45 MIL/uL Final  . Hemoglobin 03/09/2018 10.2* 11.6 - 15.9 g/dL Final  . HCT 03/09/2018 30.6* 34.8 - 46.6 % Final  . MCV 03/09/2018 93.2  79.5 - 101.0 fL  Final  . MCH 03/09/2018 31.0  25.1 - 34.0 pg Final  . MCHC 03/09/2018 33.2  31.5 - 36.0 g/dL Final  . RDW 03/09/2018 21.9* 11.2 - 14.5 % Final  . Platelets 03/09/2018 378  145 - 400 K/uL Final  . Neutrophils Relative % 03/09/2018 72  % Final  . Neutro Abs 03/09/2018 6.9* 1.5 - 6.5 K/uL Final  . Lymphocytes Relative 03/09/2018 16  % Final  . Lymphs Abs 03/09/2018 1.6  0.9 - 3.3 K/uL Final  . Monocytes Relative 03/09/2018 11  % Final  . Monocytes Absolute 03/09/2018 1.0* 0.1 - 0.9 K/uL Final  . Eosinophils Relative 03/09/2018 0  % Final  . Eosinophils Absolute 03/09/2018 0.0  0.0 - 0.5 K/uL Final  . Basophils Relative 03/09/2018 1  % Final  . Basophils Absolute 03/09/2018 0.1  0.0 - 0.1 K/uL Final   Performed at [facility], [place] [address], [place], [place] [address]    (this displays the last labs from the last 3 days)  No results found for: TOTALPROTELP, ALBUMINELP, A1GS, A2GS, BETS, BETA2SER, GAMS, MSPIKE, SPEI (this displays SPEP labs)  No results found for: KPAFRELGTCHN,  LAMBDASER, KAPLAMBRATIO (kappa/lambda light chains)  No results found for: HGBA, HGBA2QUANT, HGBFQUANT, HGBSQUAN (Hemoglobinopathy evaluation)   No results found for: LDH  No results found for: IRON, TIBC, IRONPCTSAT (Iron and TIBC)  No results found for: FERRITIN  Urinalysis    Component Value Date/Time   LABSPEC 1.020 02/10/2011 2058   PHURINE 6.0 02/10/2011 2058   HGBUR LARGE (A) 02/10/2011 2058   BILIRUBINUR MODERATE (A) 02/10/2011 2058   KETONESUR 15 (A) 02/10/2011 2058   PROTEINUR >=300 (A) 02/10/2011 2058   UROBILINOGEN >=8.0 02/10/2011 2058   NITRITE NEGATIVE 02/10/2011 2058   LEUKOCYTESUR  02/10/2011 2058    NEGATIVE Biochemical Testing Only. Please order routine urinalysis from main lab if confirmatory testing is needed.     STUDIES: Nm Sentinel Node Inj-no Rpt (breast)  Result Date: 03/08/2018 Sulfur colloid was injected by the nuclear medicine technologist  for melanoma sentinel node.    ELIGIBLE FOR AVAILABLE RESEARCH PROTOCOL: no  ASSESSMENT: 67 y.o. [place] woman status post right breast upper outer quadrant and right axillary lymph node biopsy 09/03/2017, both positive for invasive ductal carcinoma, grade 3, estrogen receptor positive, progesterone receptor negative, with an MIB-1 of 90%, the lymph node being HER-2 positive, the breast mass HER-2 negative  (a) staging studies September 24, 2017 show a lytic lesion in T8, possible areas of concern at L1-L2  (b) biopsy/kyphoplasty/osteocool of T8 lesion 11/02/2017 confirms metastatic adenocarcinoma, 20% estrogen receptor positive, estrogen receptor negative  (c) PET scan 11/30/2017 shows no visceral disease, minimal metabolic activity at T8, no other bone lesions  (1) neoadjuvant chemotherapy with carboplatin, docetaxel, trastuzumab, and pertuzumab started 10/01/2017,   (a) Docetaxel dropped after three cycles and Gemcitabine/Carbo started on 12/15/2017.   (2) trastuzumab to continue indefinitely  (a) echocardiogram 11/38/2018 shows an ejection fraction in the 55-60% range  (b) echocardiogram 03/01/2018 shows an ejection fraction in the 60-65% range  (3) right lumpectomy and sentinel lymph node sampling 03/08/2018 with results pending  (4) adjuvant radiation to follow  (a) consider radiation to her oligo metastatic disease of bone  (5) anti-estrogens to start at the completion of local treatment  PLAN: I actually did not expect Tracie [name] to show today since it is less than 24 hours from her surgery but she looks amazingly good.  She is having essentially no complications from the surgery although of course she is being a little careful not to hyperextend her right upper extremity.  We are proceeding with trastuzumab today.  The plan is to continue that for 1 year at every 3 weeks and then switch to every 4 weeks.  At this point we do not have data for discontinuing trastuzumab in stage  IV disease.  It is generally continued indefinitely.  She is now ready for radiation.  This will be adjuvant to the breast of course but may also include the areas of concern in her bones.  I am putting in a request for a visit with the radiation oncologist next week  As soon as her surgical pathology is back of course we will give her a call  Otherwise Tracie [name] will see me in about 2 months.  She knows to call for any issues that may develop before the next visit.      Magrinat, [name], MD  03/09/18 10:36 AM Medical Oncology and Hematology [facility] [address] [place], [place] [address] Tel. [phone]    Fax. [phone]  This document serves as a record of services personally performed by  Lurline Del, MD. It was created on his behalf by Sheron Nightingale, a trained medical scribe. The creation of this record is based on the scribe's personal observations and the provider's statements to them.   I have reviewed the above documentation for accuracy and completeness, and I agree with the above.

## 2018-03-10 NOTE — Anesthesia Postprocedure Evaluation (Signed)
Anesthesia Post Note  Patient: Tracie [name]  Procedure(s) Performed: RIGHT BREAST RADIOACTIVE SEED GUIDED LUMPECTOMY WITH RIGHT AXILLARY RADIOACTIVE SEED TARGETED LYMPH NODE EXCISION AND SENTINEL LYMPH NODE BIOPSY, INJECT BLUE DYE RIGHT BREAST (Right Breast)     Patient location during evaluation: PACU Anesthesia Type: General Level of consciousness: sedated and patient cooperative Pain management: pain level controlled Vital Signs Assessment: post-procedure vital signs reviewed and stable Respiratory status: spontaneous breathing Cardiovascular status: stable Anesthetic complications: no    Last Vitals:  Vitals:   03/08/18 1440 03/08/18 1510  BP: 117/65 131/75  Pulse: 77 72  Resp: 14 17  Temp: (!) 36.4 C (!) 36.4 C  SpO2: 94% 94%    Last Pain:  Vitals:   03/08/18 1510  TempSrc:   PainSc: 0-No pain                 Nolon Nations

## 2018-03-11 ENCOUNTER — Encounter: Payer: Self-pay | Admitting: Radiation Oncology

## 2018-03-11 ENCOUNTER — Other Ambulatory Visit: Payer: Self-pay | Admitting: *Deleted

## 2018-03-11 DIAGNOSIS — Z170 Estrogen receptor positive status [ER+]: Principal | ICD-10-CM

## 2018-03-11 DIAGNOSIS — C50411 Malignant neoplasm of upper-outer quadrant of right female breast: Secondary | ICD-10-CM

## 2018-03-11 MED ORDER — OXYCODONE-ACETAMINOPHEN 5-325 MG PO TABS: 1.0000 | ORAL_TABLET | Freq: Three times a day (TID) | ORAL | 0 refills | Status: DC | PRN

## 2018-03-11 NOTE — Telephone Encounter (Signed)
Pt called requesting a prescription for pain post surgical - " at my visit Tuesday Dr Jana Hakim said for me to call if I needed something stronger then the tramadol ".  Prescription obtained and pt informed of need to pick up with an ID.

## 2018-03-11 NOTE — Progress Notes (Signed)
Location of Breast Cancer: Right Breast  Histology per Pathology Report:  09/03/17 Diagnosis 1. Lymph node, needle/core biopsy, right axilla - METASTATIC CARCINOMA IN ONE LYMPH NODE (1/1).  Receptor Status: ER (75%), PR (NEG), Her2-neu (POS), Ki- (90%) 2. Breast, right, needle core biopsy - INVASIVE DUCTAL CARCINOMA, SEE COMMENT.  Receptor Status: ER(75%), PR (NEG), Her2-neu (NEG), Ki-(90%)  11/02/17 Diagnosis Bone, biopsy, T8 - METASTATIC CARCINOMA.  ER (20%), PR (NEG)  03/08/18 Diagnosis 1. Breast, lumpectomy, right with radioactive seed - INVASIVE DUCTAL CARCINOMA, GRADE II/III, MICROSCOPIC FOCUS. - THE SURGICAL RESECTION MARGINS ARE NEGATIVE FOR CARCINOMA. - SEE ONCOLOGY TABLE BELOW. 2. Lymph node, sentinel, biopsy, right axillary - THERE IS NO EVIDENCE OF CARCINOMA IN 1 OF 1 LYMPH NODE (0/1).  Did patient present with symptoms or was this found on screening mammography?: She felt the lump herself. She then went to her PCP and was sent for a diagnostic mammogram.   Past/Anticipated interventions by surgeon, if any: 03/08/18 Procedure:                 Inject blue dye right breast                                      Right breast lumpectomy with radioactive seed localization                                      Targeted right axillary deep lymph node excisional biopsy with radioactive seed localization                                       Right axillary sentinel lymph node mapping and biopsy Surgeon:                     Edsel Petrin. Dalbert Batman, M.D., [facility]    Past/Anticipated interventions by medical oncology, if any: Dr. Jana Hakim 03/09/18 1) neoadjuvant chemotherapy with carboplatin, docetaxel, trastuzumab, and pertuzumab started 10/01/2017,              (a) Docetaxel dropped after three cycles and Gemcitabine/Carbo started on 12/15/2017.   (2) trastuzumab to continue indefinitely             (a) echocardiogram 11/38/2018 shows an ejection fraction in the 55-60% range       (b) echocardiogram 03/01/2018 shows an ejection fraction in the 60-65% range  (3) right lumpectomy and sentinel lymph node sampling 03/08/2018 with results pending  (4) adjuvant radiation to follow             (a) consider radiation to her oligo metastatic disease of bone  (5) anti-estrogens to start at the completion of local treatment    Lymphedema issues, if any:  She denies. She does report swelling to her Right Breast.   Pain issues, if any:  She reports soreness at her nipple.   SAFETY ISSUES:  Prior radiation? No  Pacemaker/ICD? No  Possible current pregnancy? No  Is the patient on methotrexate? No  Current Complaints / other details:    BP 100/73   Pulse 82   Temp 98.4 F (36.9 C)   Ht 5' 3.5" (1.613 m)   Wt 161 lb 6.4 oz (73.2 kg)   SpO2 100% Comment: room  air  BMI 28.14 kg/m    Wt Readings from Last 3 Encounters:  03/16/18 161 lb 6.4 oz (73.2 kg)  03/09/18 163 lb 14.4 oz (74.3 kg)  03/08/18 162 lb (73.5 kg)      Janace Decker, Stephani Police, RN 03/11/2018,2:08 PM

## 2018-03-16 ENCOUNTER — Other Ambulatory Visit: Payer: Self-pay

## 2018-03-16 ENCOUNTER — Ambulatory Visit
Admission: RE | Admit: 2018-03-16 | Discharge: 2018-03-16 | Disposition: A | Discharge status: Home / Self Care | Payer: 59 | Source: Ambulatory Visit | Attending: Radiation Oncology | Admitting: Radiation Oncology

## 2018-03-16 ENCOUNTER — Encounter: Payer: Self-pay | Admitting: Radiation Oncology

## 2018-03-16 VITALS — BP 100/73 | HR 82 | Temp 98.4°F | Ht 63.5 in | Wt 161.4 lb

## 2018-03-16 DIAGNOSIS — G629 Polyneuropathy, unspecified: Secondary | ICD-10-CM | POA: Diagnosis not present

## 2018-03-16 DIAGNOSIS — Z79899 Other long term (current) drug therapy: Secondary | ICD-10-CM | POA: Diagnosis not present

## 2018-03-16 DIAGNOSIS — Z170 Estrogen receptor positive status [ER+]: Principal | ICD-10-CM

## 2018-03-16 DIAGNOSIS — Z79890 Hormone replacement therapy (postmenopausal): Secondary | ICD-10-CM | POA: Insufficient documentation

## 2018-03-16 DIAGNOSIS — C50411 Malignant neoplasm of upper-outer quadrant of right female breast: Secondary | ICD-10-CM | POA: Insufficient documentation

## 2018-03-16 DIAGNOSIS — M5136 Other intervertebral disc degeneration, lumbar region: Secondary | ICD-10-CM | POA: Insufficient documentation

## 2018-03-16 DIAGNOSIS — Z886 Allergy status to analgesic agent status: Secondary | ICD-10-CM | POA: Diagnosis not present

## 2018-03-16 DIAGNOSIS — R590 Localized enlarged lymph nodes: Secondary | ICD-10-CM | POA: Diagnosis not present

## 2018-03-16 DIAGNOSIS — C773 Secondary and unspecified malignant neoplasm of axilla and upper limb lymph nodes: Secondary | ICD-10-CM | POA: Diagnosis not present

## 2018-03-16 DIAGNOSIS — C7951 Secondary malignant neoplasm of bone: Secondary | ICD-10-CM | POA: Insufficient documentation

## 2018-03-16 DIAGNOSIS — Y838 Other surgical procedures as the cause of abnormal reaction of the patient, or of later complication, without mention of misadventure at the time of the procedure: Secondary | ICD-10-CM | POA: Diagnosis not present

## 2018-03-16 DIAGNOSIS — L7634 Postprocedural seroma of skin and subcutaneous tissue following other procedure: Secondary | ICD-10-CM | POA: Insufficient documentation

## 2018-03-16 NOTE — Progress Notes (Signed)
Radiation Oncology         [phone] ________________________________  Name: Tracie [name] MRN: [id number]  Date: 03/16/2018  DOB: 27-Dec-1951  Follow-Up Visit Note  Outpatient  CC: Bartholome Bill, MD  Magrinat, [name], MD  Diagnosis:      ICD-10-CM   1. Malignant neoplasm of upper-outer quadrant of right breast in female, estrogen receptor positive ([place]) C50.411    Z17.0     Malignant neoplasm of upper-outer quadrant of right breast with bony metastases in female, Stage IV (cT2, cN1, cM1, G3, ER: Pos, PR: Neg, HER2: Pos) Cancer Staging Malignant neoplasm of upper-outer quadrant of right breast in female, estrogen receptor positive ([place]) Staging form: Breast, AJCC 8th Edition - Clinical stage from 09/09/2017: Stage IV (cT2, cN1, cM1, G3, ER: Positive, PR: Negative, HER2: Positive) - Signed by [name], MD on 03/16/2018 Staging comments: Staged at breast  Conference 9.12.18   CHIEF COMPLAINT: Here to discuss management of her right breast cancer  Narrative:  The patient returns today for follow-up.    Since consultation, the patient has undergone neoadjuvant chemotherapy by Dr. Jana Hakim with carboplatin, docetaxel, trastuzumab, and pertuzumab started 10/01/2017. Docetaxel was dropped after three cycles and Gemcitabine/Carbo started on 12/15/2017. Trastuzumab is to continue indefinitely.  She then underwent right breast lumpectomy with sentinel lymph node biopsy by Dr. Dalbert Batman on 03/08/18. Tumor size was microscopic with margins clear by at least 0.2 cm. 0/1 lymph nodes were positive for carcinoma.  Pertinent imaging thus far includes breast MRI on 09/22/17 which showed 2.2 cm invasive ductal carcinoma in the upper-outer quadrant of the right breast and metastatic axillary adenopathy. No additional lesions were seen in the right breast or the left breast.   Chest CT on 09/24/17 showed right upper lateral breast cancer with a single right axillary and a single right  subpectoral lymph node which appear prominent, favoring metastatic adenopathy. Also seen was a 2.2 cm purely lytic lesion of the T8 vertebral body, possible cortical breakthrough anteriorly, highly suspicious for a metastatic lesion. This was confirmed with abdominal CT on the same day.  NM whole body bone scan on 09/24/17 showed uptake at the thoracic and lumbar spine and question within the mid cervical spine, could not exclude osseous metastatic disease. When compared to the prior CT, the area of uptake in the midthoracic spine corresponded with the lytic lesion in T8 vertebral body and was highly suspicious for osseous metastasis. Uptake in the upper lumbar spine at approximately L1-L2 corresponded to degenerative disc disease changes present at L1-L2 on CT. Cervical spine uptake was nonspecific.  The patient underwent kyphoplasty and biopsy at T-8 on in November 2018 with pathology confirming metastatic adenocarcinoma.  PET scan on 11/30/18 showed interval resection of the right lateral breast mass and adjacent enlarged lymph node as well as interval kyphoplasty of the T8 vertebral lesion. There was some low-grade activity at the site of the prior kyphoplasty with maximum SUV 4.0, but otherwise no residual significant hypermetabolic activity to suggest malignancy as identified in the neck, chest, abdomen/pelvis, or visualized skeleton.  Patient then underwent breast MRI on 01/27/18 which showed interval resolution of enhancement in the area of known malignancy in the upper-outer quadrant of the right breast. No residual adenopathy. No new abnormalities in either breast.  On review of systems, the patient endorses swelling in her right breast and soreness at her nipple. She states actually surgical site does not cause her much trouble. Endorses neuropathy in hands and feet. Endorses  finger/toe nails falling off.  Of note, she has an adnexa mass for which pelvic US was suggested, though this has not  been done yet.           ALLERGIES:  is allergic to fish allergy; other; ace inhibitors; and tylenol [acetaminophen].  Meds: Current Outpatient Medications  Medication Sig Dispense Refill  . albuterol (VENTOLIN HFA) 108 (90 BASE) MCG/ACT inhaler Inhale 1-2 puffs into the lungs every 6 (six) hours as needed for wheezing or shortness of breath. (Patient taking differently: Inhale 2 puffs into the lungs every 6 (six) hours as needed for wheezing or shortness of breath. ) 1 Inhaler 6  . Alirocumab (PRALUENT) 75 MG/ML SOPN Inject 75 mg into the muscle every 14 (fourteen) days. 6 pen 0  . amLODipine (NORVASC) 10 MG tablet Take 10 mg by mouth daily.     Marland Kitchen levothyroxine (SYNTHROID, LEVOTHROID) 50 MCG tablet Take 50 mcg by mouth daily.    Marland Kitchen lidocaine-prilocaine (EMLA) cream Apply 1 application topically as needed (port access).    Marland Kitchen losartan-hydrochlorothiazide (HYZAAR) 100-12.5 MG tablet Take 1 tablet by mouth daily.   3  . metoprolol succinate (TOPROL-XL) 50 MG 24 hr tablet Take 1 tablet (50 mg total) by mouth daily. 90 tablet 0  . mometasone-formoterol (DULERA) 100-5 MCG/ACT AERO Inhale 2 puffs into the lungs 2 (two) times daily. 3 Inhaler 0  . Naphazoline-Glycerin (REDNESS RELIEF OP) Place 1 drop into both eyes daily as needed (redness).    . potassium chloride (K-DUR) 10 MEQ tablet Take 2 tablets (20 mEq total) by mouth daily. 60 tablet 0  . cetirizine (ZYRTEC) 10 MG tablet Take 10 mg by mouth daily as needed for allergies.   11  . ibuprofen (ADVIL,MOTRIN) 200 MG tablet Take 400 mg by mouth every 6 (six) hours as needed for headache or moderate pain.    . nitroGLYCERIN (NITROSTAT) 0.4 MG SL tablet Place 0.4 mg under the tongue every 5 (five) minutes as needed for chest pain (she has not had to take this medicine in a long time. she has it at home).     Marland Kitchen oxyCODONE-acetaminophen (PERCOCET/ROXICET) 5-325 MG tablet Take 1 tablet by mouth every 8 (eight) hours as needed for severe pain. (Patient not  taking: Reported on 03/16/2018) 20 tablet 0  . traMADol (ULTRAM) 50 MG tablet Take 1 tablet (50 mg total) by mouth every 8 (eight) hours as needed (pain). (Patient not taking: Reported on 03/16/2018) 30 tablet 0   No current facility-administered medications for this encounter.     Physical Findings:  height is 5' 3.5" (1.613 m) and weight is 161 lb 6.4 oz (73.2 kg). Her temperature is 98.4 F (36.9 C). Her blood pressure is 100/73 and her pulse is 82. Her oxygen saturation is 100%. . General: Alert and oriented, in no acute distress Psychiatric: Judgment and insight are intact. Affect is appropriate. Breast exam reveals healing upper outer quadrant surgical scar and swelling of right breast with post-op seroma.  Lab Findings: Lab Results  Component Value Date   WBC 9.6 03/09/2018   HGB 10.2 (L) 03/09/2018   HCT 30.6 (L) 03/09/2018   MCV 93.2 03/09/2018   PLT 378 03/09/2018      Radiographic Findings: I reviewed the images above  Nm Sentinel Node Inj-no Rpt (breast)  Result Date: 03/08/2018 Sulfur colloid was injected by the nuclear medicine technologist for melanoma sentinel node.    Impression/Plan: Oligometastatic disease to T8 and lymph node positive right breast cancer  with incomplete axillary surgery  Today we discussed SRS to the oligometastatic disease at T8 and also adjuvant radiation to the right breast and regional nodes. We discussed that she will be presented at our brain and spine tumor board next week and arrangements will be made for her to have appointments scheduled with neurosurgeon and MRI of spine for treatment planning.  I anticipate 1-5 fractions to her spine (likely single fraction). Side effects could include some irritation and fatigue as well as esophagitis acutely. We discussed the risk of rare permament injury to normal tissues such as the lung, heart, esophagus, spinal cord, and vertebral body.   Regarding her breast, we discussed adjuvant radiotherapy  today.  I recommend radiotherapy to the right breast and regional nodes over 6 weeks in order to improve locoregional control.  The risks, benefits and side effects of this treatment were discussed in detail.  She understands that radiotherapy is associated with skin irritation and fatigue in the acute setting. Late effects can include cosmetic changes and rare injury to internal organs.   She is enthusiastic about proceeding with treatment. A consent form has been signed for both the spine and the breast and placed in her chart.  We will wait until early April for treatment planning so that she has time to heal from surgery.  I spent at least 30 minutes minutes face to face with the patient and more than 50% of that time was spent in counseling and/or coordination of care.    _____________________________________   [name], MD  This document serves as a record of services personally performed by [name], MD. It was created on his behalf by Linward Natal, a trained medical scribe. The creation of this record is based on the scribe's personal observations and the provider's statements to them. This document has been checked and approved by the attending provider.

## 2018-03-24 ENCOUNTER — Other Ambulatory Visit: Payer: Self-pay | Admitting: Radiation Therapy

## 2018-03-24 DIAGNOSIS — C7951 Secondary malignant neoplasm of bone: Secondary | ICD-10-CM

## 2018-03-29 ENCOUNTER — Other Ambulatory Visit: Payer: Self-pay | Admitting: Oncology

## 2018-03-30 ENCOUNTER — Inpatient Hospital Stay: Payer: 59 | Attending: Oncology

## 2018-03-30 ENCOUNTER — Other Ambulatory Visit: Payer: Self-pay

## 2018-03-30 ENCOUNTER — Ambulatory Visit
Admission: RE | Admit: 2018-03-30 | Discharge: 2018-03-30 | Disposition: A | Discharge status: Home / Self Care | Payer: 59 | Source: Ambulatory Visit | Attending: Radiation Oncology | Admitting: Radiation Oncology

## 2018-03-30 DIAGNOSIS — Z5112 Encounter for antineoplastic immunotherapy: Secondary | ICD-10-CM | POA: Insufficient documentation

## 2018-03-30 DIAGNOSIS — Z170 Estrogen receptor positive status [ER+]: Secondary | ICD-10-CM | POA: Insufficient documentation

## 2018-03-30 DIAGNOSIS — Z510 Encounter for antineoplastic radiation therapy: Secondary | ICD-10-CM | POA: Insufficient documentation

## 2018-03-30 DIAGNOSIS — C50411 Malignant neoplasm of upper-outer quadrant of right female breast: Secondary | ICD-10-CM | POA: Insufficient documentation

## 2018-03-30 DIAGNOSIS — C773 Secondary and unspecified malignant neoplasm of axilla and upper limb lymph nodes: Secondary | ICD-10-CM | POA: Insufficient documentation

## 2018-03-30 MED ORDER — ACETAMINOPHEN 325 MG PO TABS
ORAL_TABLET | ORAL | Status: AC
  Filled 2018-03-30: qty 2

## 2018-03-30 MED ORDER — ACETAMINOPHEN 325 MG PO TABS
650.0000 mg | ORAL_TABLET | Freq: Once | ORAL | Status: AC
  Administered 2018-03-30: 650 mg via ORAL

## 2018-03-30 MED ORDER — SODIUM CHLORIDE 0.9 % IV SOLN: Freq: Once | INTRAVENOUS | Status: DC

## 2018-03-30 MED ORDER — HEPARIN SOD (PORK) LOCK FLUSH 100 UNIT/ML IV SOLN
500.0000 [IU] | Freq: Once | INTRAVENOUS | Status: AC | PRN
Start: 2018-03-30 — End: 2018-03-30
  Administered 2018-03-30: 500 [IU]
  Filled 2018-03-30: qty 5

## 2018-03-30 MED ORDER — SODIUM CHLORIDE 0.9 % IV SOLN
Freq: Once | INTRAVENOUS | Status: AC
  Administered 2018-03-30: 10:00:00 via INTRAVENOUS

## 2018-03-30 MED ORDER — TRASTUZUMAB CHEMO 150 MG IV SOLR: 6.0000 mg/kg | Freq: Once | INTRAVENOUS | Status: DC

## 2018-03-30 MED ORDER — DIPHENHYDRAMINE HCL 25 MG PO CAPS
ORAL_CAPSULE | ORAL | Status: AC
  Filled 2018-03-30: qty 1

## 2018-03-30 MED ORDER — DIPHENHYDRAMINE HCL 25 MG PO CAPS
25.0000 mg | ORAL_CAPSULE | Freq: Once | ORAL | Status: AC
  Administered 2018-03-30: 25 mg via ORAL

## 2018-03-30 MED ORDER — SODIUM CHLORIDE 0.9% FLUSH
10.0000 mL | INTRAVENOUS | Status: DC | PRN
  Administered 2018-03-30: 10 mL
  Filled 2018-03-30: qty 10

## 2018-03-30 MED ORDER — TRASTUZUMAB CHEMO 150 MG IV SOLR
450.0000 mg | Freq: Once | INTRAVENOUS | Status: AC
  Administered 2018-03-30: 450 mg via INTRAVENOUS
  Filled 2018-03-30: qty 21.43

## 2018-03-30 NOTE — Progress Notes (Signed)
Dr Jana Hakim with orders for NS 1 liter IV over 2 hours.  Notified Psychiatric nurse in infusion.

## 2018-03-30 NOTE — Patient Instructions (Signed)
[facility] Discharge Instructions for Patients Receiving Chemotherapy  Today you received the following chemotherapy agents Herceptin  To help prevent nausea and vomiting after your treatment, we encourage you to take your nausea medication as directed   If you develop nausea and vomiting that is not controlled by your nausea medication, call the clinic.   BELOW ARE SYMPTOMS THAT SHOULD BE REPORTED IMMEDIATELY:  *FEVER GREATER THAN 100.5 F  *CHILLS WITH OR WITHOUT FEVER  NAUSEA AND VOMITING THAT IS NOT CONTROLLED WITH YOUR NAUSEA MEDICATION  *UNUSUAL SHORTNESS OF BREATH  *UNUSUAL BRUISING OR BLEEDING  TENDERNESS IN MOUTH AND THROAT WITH OR WITHOUT PRESENCE OF ULCERS  *URINARY PROBLEMS  *BOWEL PROBLEMS  UNUSUAL RASH Items with * indicate a potential emergency and should be followed up as soon as possible.  Feel free to call the clinic should you have any questions or concerns. The clinic phone number is [phone].  Please show the CHEMO ALERT CARD at check-in to the Emergency Department and triage nurse.   

## 2018-03-30 NOTE — Progress Notes (Addendum)
  Radiation Oncology         [phone] ________________________________  Name: Tracie [name] MRN: [id number]  Date: 03/30/2018  DOB: [birth year]  SIMULATION AND TREATMENT PLANNING NOTE    Outpatient  DIAGNOSIS:     ICD-10-CM   1. Malignant neoplasm of upper-outer quadrant of right breast in female, estrogen receptor positive ([place]) C50.411    Z17.0     NARRATIVE:  The patient was brought to the [place].  Identity was confirmed.  All relevant records and images related to the planned course of therapy were reviewed.  The patient freely provided informed written consent to proceed with treatment after reviewing the details related to the planned course of therapy. The consent form was witnessed and verified by the simulation staff.    Then, the patient was set-up in a stable reproducible supine position for radiation therapy with her ipsilateral arm over her head, and her upper body secured in a custom-made Vac-lok device.  CT images were obtained.  Surface markings were placed.  The CT images were loaded into the planning software.    TREATMENT PLANNING NOTE: Treatment planning then occurred.  The radiation prescription was entered and confirmed.     A total of 5 medically necessary complex treatment devices were fabricated and supervised by me: 4 fields with MLCs for custom blocks to protect heart, and lungs;  and, a Vac-lok. MORE COMPLEX DEVICES MAY BE MADE IN DOSIMETRY FOR FIELD IN FIELD BEAMS FOR DOSE HOMOGENEITY.  I have requested : 3D Simulation which is medically necessary to give adequate dose to at risk tissues while sparing lungs and heart.  I have requested a DVH of the following structures: lungs, heart, lumpectomy cavity, cord, esophagus.    The patient will receive 50 Gy in 25 fractions to the right breast and regional nodes with 4 fields.  This will not be followed by a boost.  Optical Surface Tracking Plan:  Since intensity modulated radiotherapy  (IMRT) and 3D conformal radiation treatment methods are predicated on accurate and precise positioning for treatment, intrafraction motion monitoring is medically necessary to ensure accurate and safe treatment delivery. The ability to quantify intrafraction motion without excessive ionizing radiation dose can only be performed with optical surface tracking. Accordingly, surface imaging offers the opportunity to obtain 3D measurements of patient position throughout IMRT and 3D treatments without excessive radiation exposure. I am ordering optical surface tracking for this patient's upcoming course of radiotherapy.  ________________________________   Reference:  Ursula Alert, J, et al. Surface imaging-based analysis of intrafraction motion for breast radiotherapy patients.Journal of [place], n. 6, nov. 2014. ISSN [id number].  Available at: <http://www.jacmp.org/index.php/jacmp/article/view/4957>.    -----------------------------------  [name], MD

## 2018-03-31 DIAGNOSIS — C50411 Malignant neoplasm of upper-outer quadrant of right female breast: Secondary | ICD-10-CM | POA: Diagnosis not present

## 2018-03-31 NOTE — Addendum Note (Signed)
Encounter addended by: [name], MD on: 03/31/2018 4:23 PM  Actions taken: Sign clinical note

## 2018-04-06 ENCOUNTER — Ambulatory Visit
Admission: RE | Admit: 2018-04-06 | Discharge: 2018-04-06 | Disposition: A | Discharge status: Home / Self Care | Payer: 59 | Source: Ambulatory Visit | Attending: Radiation Oncology | Admitting: Radiation Oncology

## 2018-04-06 ENCOUNTER — Telehealth: Payer: Self-pay | Admitting: Oncology

## 2018-04-06 DIAGNOSIS — C50411 Malignant neoplasm of upper-outer quadrant of right female breast: Secondary | ICD-10-CM | POA: Diagnosis not present

## 2018-04-06 DIAGNOSIS — Z170 Estrogen receptor positive status [ER+]: Principal | ICD-10-CM

## 2018-04-06 MED ORDER — RADIAPLEXRX EX GEL
Freq: Once | CUTANEOUS | Status: AC
  Administered 2018-04-06: 08:00:00 via TOPICAL

## 2018-04-06 MED ORDER — ALRA NON-METALLIC DEODORANT (RAD-ONC)
1.0000 "application " | Freq: Once | TOPICAL | Status: AC
  Administered 2018-04-06: 1 via TOPICAL

## 2018-04-06 NOTE — Progress Notes (Signed)

## 2018-04-06 NOTE — Telephone Encounter (Signed)
Left voicemail informing patient Disability forms have been successfully faxed to [place] at [phone]. Mailed copy to patient.

## 2018-04-07 ENCOUNTER — Ambulatory Visit
Admission: RE | Admit: 2018-04-07 | Discharge: 2018-04-07 | Disposition: A | Discharge status: Home / Self Care | Payer: 59 | Source: Ambulatory Visit | Attending: Radiation Oncology | Admitting: Radiation Oncology

## 2018-04-07 DIAGNOSIS — C50411 Malignant neoplasm of upper-outer quadrant of right female breast: Secondary | ICD-10-CM | POA: Diagnosis not present

## 2018-04-08 ENCOUNTER — Ambulatory Visit
Admission: RE | Admit: 2018-04-08 | Discharge: 2018-04-08 | Disposition: A | Discharge status: Home / Self Care | Payer: 59 | Source: Ambulatory Visit | Attending: Radiation Oncology | Admitting: Radiation Oncology

## 2018-04-08 DIAGNOSIS — C50411 Malignant neoplasm of upper-outer quadrant of right female breast: Secondary | ICD-10-CM | POA: Diagnosis not present

## 2018-04-09 ENCOUNTER — Ambulatory Visit
Admission: RE | Admit: 2018-04-09 | Discharge: 2018-04-09 | Disposition: A | Discharge status: Home / Self Care | Payer: 59 | Source: Ambulatory Visit | Attending: Radiation Oncology | Admitting: Radiation Oncology

## 2018-04-09 DIAGNOSIS — C50411 Malignant neoplasm of upper-outer quadrant of right female breast: Secondary | ICD-10-CM | POA: Diagnosis not present

## 2018-04-12 ENCOUNTER — Ambulatory Visit
Admission: RE | Admit: 2018-04-12 | Discharge: 2018-04-12 | Disposition: A | Discharge status: Home / Self Care | Payer: 59 | Source: Ambulatory Visit | Attending: Radiation Oncology | Admitting: Radiation Oncology

## 2018-04-12 ENCOUNTER — Telehealth: Payer: Self-pay | Admitting: *Deleted

## 2018-04-12 DIAGNOSIS — C50411 Malignant neoplasm of upper-outer quadrant of right female breast: Secondary | ICD-10-CM | POA: Diagnosis not present

## 2018-04-13 ENCOUNTER — Ambulatory Visit
Admission: RE | Admit: 2018-04-13 | Discharge: 2018-04-13 | Disposition: A | Discharge status: Home / Self Care | Payer: 59 | Source: Ambulatory Visit | Attending: Radiation Oncology | Admitting: Radiation Oncology

## 2018-04-13 DIAGNOSIS — C50411 Malignant neoplasm of upper-outer quadrant of right female breast: Secondary | ICD-10-CM | POA: Diagnosis not present

## 2018-04-14 ENCOUNTER — Ambulatory Visit
Admission: RE | Admit: 2018-04-14 | Discharge: 2018-04-14 | Disposition: A | Discharge status: Home / Self Care | Payer: 59 | Source: Ambulatory Visit | Attending: Radiation Oncology | Admitting: Radiation Oncology

## 2018-04-14 DIAGNOSIS — C50411 Malignant neoplasm of upper-outer quadrant of right female breast: Secondary | ICD-10-CM | POA: Diagnosis not present

## 2018-04-15 ENCOUNTER — Ambulatory Visit
Admission: RE | Admit: 2018-04-15 | Discharge: 2018-04-15 | Disposition: A | Discharge status: Home / Self Care | Payer: 59 | Source: Ambulatory Visit | Attending: Radiation Oncology | Admitting: Radiation Oncology

## 2018-04-15 DIAGNOSIS — C50411 Malignant neoplasm of upper-outer quadrant of right female breast: Secondary | ICD-10-CM | POA: Diagnosis not present

## 2018-04-15 DIAGNOSIS — C7951 Secondary malignant neoplasm of bone: Secondary | ICD-10-CM

## 2018-04-15 MED ORDER — GADOBENATE DIMEGLUMINE 529 MG/ML IV SOLN
15.0000 mL | Freq: Once | INTRAVENOUS | Status: AC | PRN
  Administered 2018-04-15: 15 mL via INTRAVENOUS

## 2018-04-16 ENCOUNTER — Ambulatory Visit
Admission: RE | Admit: 2018-04-16 | Discharge: 2018-04-16 | Disposition: A | Discharge status: Home / Self Care | Payer: 59 | Source: Ambulatory Visit | Attending: Radiation Oncology | Admitting: Radiation Oncology

## 2018-04-16 DIAGNOSIS — C50411 Malignant neoplasm of upper-outer quadrant of right female breast: Secondary | ICD-10-CM | POA: Diagnosis not present

## 2018-04-19 ENCOUNTER — Ambulatory Visit
Admission: RE | Admit: 2018-04-19 | Discharge: 2018-04-19 | Disposition: A | Discharge status: Home / Self Care | Payer: 59 | Source: Ambulatory Visit | Attending: Radiation Oncology | Admitting: Radiation Oncology

## 2018-04-19 DIAGNOSIS — C7951 Secondary malignant neoplasm of bone: Secondary | ICD-10-CM

## 2018-04-19 DIAGNOSIS — C50411 Malignant neoplasm of upper-outer quadrant of right female breast: Secondary | ICD-10-CM | POA: Diagnosis not present

## 2018-04-20 ENCOUNTER — Ambulatory Visit
Admission: RE | Admit: 2018-04-20 | Discharge: 2018-04-20 | Disposition: A | Discharge status: Home / Self Care | Payer: 59 | Source: Ambulatory Visit | Attending: Radiation Oncology | Admitting: Radiation Oncology

## 2018-04-20 ENCOUNTER — Inpatient Hospital Stay: Payer: 59

## 2018-04-20 VITALS — BP 116/68 | HR 79 | Temp 98.8°F | Resp 18 | Ht 63.5 in | Wt 160.1 lb

## 2018-04-20 DIAGNOSIS — C7951 Secondary malignant neoplasm of bone: Secondary | ICD-10-CM | POA: Insufficient documentation

## 2018-04-20 DIAGNOSIS — Z5112 Encounter for antineoplastic immunotherapy: Secondary | ICD-10-CM | POA: Diagnosis not present

## 2018-04-20 DIAGNOSIS — Z170 Estrogen receptor positive status [ER+]: Principal | ICD-10-CM

## 2018-04-20 DIAGNOSIS — C50411 Malignant neoplasm of upper-outer quadrant of right female breast: Secondary | ICD-10-CM | POA: Diagnosis not present

## 2018-04-20 MED ORDER — ACETAMINOPHEN 325 MG PO TABS
650.0000 mg | ORAL_TABLET | Freq: Once | ORAL | Status: AC
  Administered 2018-04-20: 650 mg via ORAL

## 2018-04-20 MED ORDER — SODIUM CHLORIDE 0.9 % IV SOLN
Freq: Once | INTRAVENOUS | Status: AC
  Administered 2018-04-20: 08:00:00 via INTRAVENOUS

## 2018-04-20 MED ORDER — DIPHENHYDRAMINE HCL 25 MG PO CAPS
ORAL_CAPSULE | ORAL | Status: AC
Start: 2018-04-20 — End: 2018-04-20
  Filled 2018-04-20: qty 1

## 2018-04-20 MED ORDER — DIPHENHYDRAMINE HCL 25 MG PO CAPS
25.0000 mg | ORAL_CAPSULE | Freq: Once | ORAL | Status: AC
  Administered 2018-04-20: 25 mg via ORAL

## 2018-04-20 MED ORDER — ACETAMINOPHEN 325 MG PO TABS
ORAL_TABLET | ORAL | Status: AC
  Filled 2018-04-20: qty 2

## 2018-04-20 MED ORDER — TRASTUZUMAB CHEMO 150 MG IV SOLR
450.0000 mg | Freq: Once | INTRAVENOUS | Status: AC
  Administered 2018-04-20: 450 mg via INTRAVENOUS
  Filled 2018-04-20: qty 21.43

## 2018-04-20 MED ORDER — HEPARIN SOD (PORK) LOCK FLUSH 100 UNIT/ML IV SOLN
500.0000 [IU] | Freq: Once | INTRAVENOUS | Status: AC | PRN
  Administered 2018-04-20: 500 [IU]
  Filled 2018-04-20: qty 5

## 2018-04-20 MED ORDER — SODIUM CHLORIDE 0.9% FLUSH
10.0000 mL | INTRAVENOUS | Status: DC | PRN
  Administered 2018-04-20: 10 mL
  Filled 2018-04-20: qty 10

## 2018-04-20 NOTE — Progress Notes (Signed)
  Radiation Oncology         [phone] ________________________________  Name: KADYNCE BONDS MRN: [id number]  Date: 04/19/2018  DOB: 1951/10/18  SIMULATION AND TREATMENT PLANNING NOTE; SPECIAL TREATMENT PROCEDURE NOTE:  OUTPATIENT  DIAGNOSIS:    ICD-10-CM   1. Bone metastasis ([place]) C79.51     NARRATIVE:  The patient was brought to the [facility].  Identity was confirmed.  All relevant records and images related to the planned course of therapy were reviewed.  The patient freely provided informed written consent to proceed with treatment after reviewing the details related to the planned course of therapy. The consent form was witnessed and verified by the simulation staff. Intravenous access was established for contrast administration. Then, the patient was set-up in a stable reproducible supine position for radiation therapy in a vacuum-conformed body bag.  CT images were obtained.  Surface markings were placed.  The CT images were loaded into the planning software and fused with the patient's targeting MRI scan.  Then the target and avoidance structures were contoured.  Treatment planning then occurred.  The radiation prescription was entered and confirmed.  I have requested 3D planning  I have requested a DVH of the following structures:  target volumes, lungs, heart, esophagus, spinal cord, and other normal tissues.  PLAN:  The patient will receive 18 Gy in 1 fraction to the high risk portion of the vertebral body at T8, and 14 Gy in 1 fraction to the entire vertebral body and pedicles b/l.Marland Kitchen Stereotactic radiosurgery will be used.  SPECIAL TREATMENT PROCEDURE NOTE:   This constitutes a special treatment procedure due to the ablative dose that will be delivered and the technical nature of treatment.  This highly technical modality of treatment ensures that the ablative dose is centered on the patient's tumor while sparing normal tissues from excessive dose and risk of  detrimental effects.   -----------------------------------  [name], MD

## 2018-04-20 NOTE — Patient Instructions (Signed)
[facility] Discharge Instructions for Patients Receiving Chemotherapy Today you received the following chemotherapy agents:  Herceptin To help prevent nausea and vomiting after your treatment, we encourage you to take your nausea medication as prescribed.   If you develop nausea and vomiting that is not controlled by your nausea medication, call the clinic.   BELOW ARE SYMPTOMS THAT SHOULD BE REPORTED IMMEDIATELY:  *FEVER GREATER THAN 100.5 F  *CHILLS WITH OR WITHOUT FEVER  NAUSEA AND VOMITING THAT IS NOT CONTROLLED WITH YOUR NAUSEA MEDICATION  *UNUSUAL SHORTNESS OF BREATH  *UNUSUAL BRUISING OR BLEEDING  TENDERNESS IN MOUTH AND THROAT WITH OR WITHOUT PRESENCE OF ULCERS  *URINARY PROBLEMS  *BOWEL PROBLEMS  UNUSUAL RASH Items with * indicate a potential emergency and should be followed up as soon as possible.  Feel free to call the clinic should you have any questions or concerns. The clinic phone number is [phone].  Please show the CHEMO ALERT CARD at check-in to the Emergency Department and triage nurse.   

## 2018-04-21 ENCOUNTER — Ambulatory Visit
Admission: RE | Admit: 2018-04-21 | Discharge: 2018-04-21 | Disposition: A | Discharge status: Home / Self Care | Payer: 59 | Source: Ambulatory Visit | Attending: Radiation Oncology | Admitting: Radiation Oncology

## 2018-04-21 ENCOUNTER — Encounter: Payer: Self-pay | Admitting: Radiation Oncology

## 2018-04-21 VITALS — BP 133/65 | HR 79 | Temp 98.3°F | Resp 18

## 2018-04-21 DIAGNOSIS — C50411 Malignant neoplasm of upper-outer quadrant of right female breast: Secondary | ICD-10-CM | POA: Diagnosis not present

## 2018-04-21 DIAGNOSIS — Z923 Personal history of irradiation: Secondary | ICD-10-CM

## 2018-04-21 DIAGNOSIS — C7951 Secondary malignant neoplasm of bone: Secondary | ICD-10-CM

## 2018-04-21 HISTORY — DX: Personal history of irradiation: Z92.3

## 2018-04-21 NOTE — Procedures (Signed)
   Name: Tracie [name]  MRN: [id number]  Date: 04/21/2018   DOB: [birth year]  Stereotactic Radiosurgery Operative Note  PRE-OPERATIVE DIAGNOSIS:  Spinal Metastasis  POST-OPERATIVE DIAGNOSIS:  Spinal Metastasis  PROCEDURE:  Stereotactic Radiosurgery  SURGEON:  Dymond Gutt P, MD  NARRATIVE: The patient underwent a radiation treatment planning session in the radiation oncology simulation suite under the care of the radiation oncology physician and physicist.  I participated closely in the radiation treatment planning afterwards. The patient underwent planning CT myelogram which was fused to the MRI.  These images were fused on the planning system.  Radiation oncology contoured the gross target volume and subsequently expanded this to yield the Planning Target Volume. I actively participated in the planning process.  I helped to define and review the target contours and also the contours of the spinal cord, and selected nearby organs at risk.  All the dose constraints for critical structures were reviewed and compared to AAPM Task Group 101.  The prescription dose conformity was reviewed.  I approved the plan electronically.    Accordingly, Tracie [name] was brought to the TrueBeam stereotactic radiation treatment linac and placed in the custom immobilization device.  The patient was aligned according to the IR fiducial markers with BrainLab Exactrac, then orthogonal x-rays were used in ExacTrac with the 6DOF robotic table and the shifts were made to align the patient.  Then conebeam CT was performed to verify precision.  Tracie [name] received stereotactic radiosurgery uneventfully.  The detailed description of the procedure is recorded in the radiation oncology procedure note.  I was present for the duration of the procedure.  DISPOSITION:  Following delivery, the patient was transported to nursing in stable condition and monitored for possible acute effects to be discharged to home in stable  condition with follow-up in one month.  Samera Macy P, MD 04/21/2018 1:18 PM

## 2018-04-22 ENCOUNTER — Ambulatory Visit
Admission: RE | Admit: 2018-04-22 | Discharge: 2018-04-22 | Disposition: A | Discharge status: Home / Self Care | Payer: 59 | Source: Ambulatory Visit | Attending: Radiation Oncology | Admitting: Radiation Oncology

## 2018-04-22 DIAGNOSIS — C50411 Malignant neoplasm of upper-outer quadrant of right female breast: Secondary | ICD-10-CM | POA: Diagnosis not present

## 2018-04-23 ENCOUNTER — Ambulatory Visit: Payer: 59 | Admitting: Radiation Oncology

## 2018-04-23 ENCOUNTER — Ambulatory Visit
Admission: RE | Admit: 2018-04-23 | Discharge: 2018-04-23 | Disposition: A | Discharge status: Home / Self Care | Payer: 59 | Source: Ambulatory Visit | Attending: Radiation Oncology | Admitting: Radiation Oncology

## 2018-04-23 DIAGNOSIS — C50411 Malignant neoplasm of upper-outer quadrant of right female breast: Secondary | ICD-10-CM | POA: Diagnosis not present

## 2018-04-23 NOTE — Progress Notes (Signed)
  Radiation Oncology         [phone] ________________________________  Stereotactic Treatment Procedure Note  Name: Tracie [name] MRN: [id number]  Date: 04/21/2018  DOB: 23-Nov-1951  SPECIAL TREATMENT PROCEDURE Outpatient   ICD-10-CM   1. Bone metastasis ([place]) C79.51     3D TREATMENT PLANNING AND DOSIMETRY:  The patient's radiation plan was reviewed and approved by neurosurgery and radiation oncology prior to treatment.  It showed 3-dimensional radiation distributions overlaid onto the planning CT/MRI image set.  The [facility] for the target structures as well as the organs at risk were reviewed. The documentation of the 3D plan and dosimetry are filed in the radiation oncology EMR.  NARRATIVE:  Tracie [name] was brought to the TrueBeam stereotactic radiation treatment machine and placed supine on the CT couch. The vacuum suction based immobilization in a body bag was applied and the patient was set up for stereotactic radiosurgery.  Neurosurgery was present for the set-up and delivery  SIMULATION VERIFICATION:  In the couch zero-angle position, the patient underwent Exactrac imaging using the Brainlab system with orthogonal KV images.  These were carefully aligned and repeated to confirm treatment position for each of the isocenters.  The Exactrac snap film verification was repeated at each couch angle.  SPECIAL TREATMENT PROCEDURE: [name] received stereotactic radiosurgery to the following targets: Comments: T8  target was treated using 3 Rapid Arc VMAT Beams to a prescription dose of 18 Gy.  ExacTrac Snap verification was performed as well as a confirmatory CBCT before treatment delivery   This constitutes a special treatment procedure due to the ablative dose delivered and the technical nature of treatment.  This highly technical modality of treatment ensures that the ablative dose is centered on the patient's tumor while sparing normal tissues from excessive dose and risk  of detrimental effects.  STEREOTACTIC TREATMENT MANAGEMENT:  Following delivery, the patient was transported to nursing in stable condition and monitored for possible acute effects.  Vital signs were recorded BP 133/65   Pulse 79   Temp 98.3 F (36.8 C)   Resp 18   SpO2 99% Comment: room air. The patient tolerated treatment without significant acute effects, and was discharged to home in stable condition.    PLAN: Follow-up tomorrow to clinic for continued breast RT  ________________________________   [name], MD

## 2018-04-26 ENCOUNTER — Ambulatory Visit
Admission: RE | Admit: 2018-04-26 | Discharge: 2018-04-26 | Disposition: A | Discharge status: Home / Self Care | Payer: 59 | Source: Ambulatory Visit | Attending: Radiation Oncology | Admitting: Radiation Oncology

## 2018-04-26 DIAGNOSIS — C50411 Malignant neoplasm of upper-outer quadrant of right female breast: Secondary | ICD-10-CM | POA: Diagnosis not present

## 2018-04-27 ENCOUNTER — Ambulatory Visit
Admission: RE | Admit: 2018-04-27 | Discharge: 2018-04-27 | Disposition: A | Discharge status: Home / Self Care | Payer: 59 | Source: Ambulatory Visit | Attending: Radiation Oncology | Admitting: Radiation Oncology

## 2018-04-27 DIAGNOSIS — C50411 Malignant neoplasm of upper-outer quadrant of right female breast: Secondary | ICD-10-CM | POA: Diagnosis not present

## 2018-04-28 ENCOUNTER — Ambulatory Visit
Admission: RE | Admit: 2018-04-28 | Discharge: 2018-04-28 | Disposition: A | Discharge status: Home / Self Care | Payer: 59 | Source: Ambulatory Visit | Attending: Radiation Oncology | Admitting: Radiation Oncology

## 2018-04-28 ENCOUNTER — Encounter: Payer: Self-pay | Admitting: Radiation Oncology

## 2018-04-28 DIAGNOSIS — Z170 Estrogen receptor positive status [ER+]: Secondary | ICD-10-CM | POA: Insufficient documentation

## 2018-04-28 DIAGNOSIS — C50411 Malignant neoplasm of upper-outer quadrant of right female breast: Secondary | ICD-10-CM | POA: Diagnosis not present

## 2018-04-28 DIAGNOSIS — Z510 Encounter for antineoplastic radiation therapy: Secondary | ICD-10-CM | POA: Insufficient documentation

## 2018-04-28 NOTE — Progress Notes (Deleted)
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, Stage IV (cT2, cN1, cM1, G3, ER: Pos, PR: Neg, HER2: Pos) Cancer Staging Malignant neoplasm of upper-outer quadrant of right breast in female, estrogen receptor positive ([place]) Staging form: Breast, AJCC 8th Edition - Clinical stage from 09/09/2017: Stage IV (cT2, cN1, cM1, G3, ER: Positive, PR: Negative, HER2: Positive) - Signed by [name], MD on 03/16/2018 Staging comments: Staged at breast  Conference 9.12.18    Indication for treatment:  Aggressive local control      Radiation treatment dates:   04/21/2018  Site/dose:   T8 spine, 18 Gy in 1 fraction    Beams/energy:   Photon, SBRT/SRT-VMAT, 10FFF photons.   3 VMAT beams Max dose=119.4%  Narrative: The patient tolerated radiation treatment relatively well.   Plan: The patient has completed SBRT spinal radiation treatment. Continue breast radiotherapy.  -----------------------------------  [name], MD  This document serves as a record of services personally performed by [name], MD. It was created on her behalf by Steva Colder, a trained medical scribe. The creation of this record is based on the scribe's personal observations and the provider's statements to them. This document has been checked and approved by the attending provider.

## 2018-04-29 ENCOUNTER — Ambulatory Visit
Admission: RE | Admit: 2018-04-29 | Discharge: 2018-04-29 | Disposition: A | Discharge status: Home / Self Care | Payer: 59 | Source: Ambulatory Visit | Attending: Radiation Oncology | Admitting: Radiation Oncology

## 2018-04-29 DIAGNOSIS — C50411 Malignant neoplasm of upper-outer quadrant of right female breast: Secondary | ICD-10-CM | POA: Diagnosis not present

## 2018-04-30 ENCOUNTER — Ambulatory Visit
Admission: RE | Admit: 2018-04-30 | Discharge: 2018-04-30 | Disposition: A | Discharge status: Home / Self Care | Payer: 59 | Source: Ambulatory Visit | Attending: Radiation Oncology | Admitting: Radiation Oncology

## 2018-04-30 DIAGNOSIS — C50411 Malignant neoplasm of upper-outer quadrant of right female breast: Secondary | ICD-10-CM | POA: Diagnosis not present

## 2018-05-03 ENCOUNTER — Ambulatory Visit
Admission: RE | Admit: 2018-05-03 | Discharge: 2018-05-03 | Disposition: A | Discharge status: Home / Self Care | Payer: 59 | Source: Ambulatory Visit | Attending: Radiation Oncology | Admitting: Radiation Oncology

## 2018-05-03 DIAGNOSIS — C50411 Malignant neoplasm of upper-outer quadrant of right female breast: Secondary | ICD-10-CM | POA: Diagnosis not present

## 2018-05-04 ENCOUNTER — Ambulatory Visit
Admission: RE | Admit: 2018-05-04 | Discharge: 2018-05-04 | Disposition: A | Discharge status: Home / Self Care | Payer: 59 | Source: Ambulatory Visit | Attending: Radiation Oncology | Admitting: Radiation Oncology

## 2018-05-04 DIAGNOSIS — Z170 Estrogen receptor positive status [ER+]: Principal | ICD-10-CM

## 2018-05-04 DIAGNOSIS — C50411 Malignant neoplasm of upper-outer quadrant of right female breast: Secondary | ICD-10-CM | POA: Diagnosis not present

## 2018-05-04 MED ORDER — RADIAPLEXRX EX GEL
Freq: Once | CUTANEOUS | Status: AC
  Administered 2018-05-04: 11:00:00 via TOPICAL

## 2018-05-04 MED ORDER — SILVER SULFADIAZINE 1 % EX CREA
TOPICAL_CREAM | Freq: Two times a day (BID) | CUTANEOUS | Status: DC
  Administered 2018-05-04: 11:00:00 via TOPICAL

## 2018-05-04 NOTE — Progress Notes (Signed)
[place]  [phone] [phone]     ID: Tracie [name] DOB: 1951/01/07  MR#: [id number]  ZPH#:[id number]  Patient Care Team: Bartholome Bill, MD as PCP - General (Family Medicine) [name], MD as Consulting Physician (General Surgery) Eligha Kmetz, [name], MD as Consulting Physician (Oncology) [name], MD as Attending Physician (Radiation Oncology) OTHER MD:  CHIEF COMPLAINT: Triple positive breast cancer  CURRENT TREATMENT: trastuzumab; adjuvant radiation pending   HISTORY OF CURRENT ILLNESS: From the original intake note:  Tracie [name] noted a change in her right breast sometime in July 2018. [facility] to report that she had found a "kernel" in her breast. She was advised to see her primary physician which she did. The patient was then set up for bilateral diagnostic mammography with tomography and right breast ultrasonography at [facility] 09/03/2017. The breast density was category I a. In the upper outer quadrant of the right breast there was a 2.1 cm high density mass, which was palpable. Also in the right breast more posteriorly there was a 1.5 cm high density mass which was felt to be a suspicious lymph node. Ultrasound of the right breast confirmed a 2.1 centimeter lobulated solid mass in the right breast upper outer quadrant 15.8 cm from the nipple in the 10:00 radiant. There was a 1.5 cm oval mass in the right axillary tail with a small rounded masses also suspicious for lymph node involvement. A total of 3 suspicious lymph nodes were identified.  On 09/03/2017 the patient underwent biopsy of the breast mass and the suspicious right axillary lymph node. The final pathology ([id number]) found invasive ductal carcinoma, grade 3, in both. Both tumors were estrogen receptor positive at 70-75%, and both were progesterone receptor negative. Both had an elevated proliferation marker at 90%. The mass in the breast was  HER-2 negative, with a signals ratio of 1.25 and the number per cell 1.88. The lymph node mass however was HER-2 positive with a signals ratio of 2.57, and the number per cell 3.60.  The patient's subsequent history is as detailed below.  INTERVAL HISTORY: Tracie [name] returns today for follow-up and treatment of her triple positive breast cancer. She receives trastuzumab/ pertuzumab every 21 days, with a dose due today. She tolerates this well with no complications. She is scheduled for her next echocardiogram on 06/04/2018.  Today is her final day of adjuvant radiation treatments. Her skin is raw and sore in the affected area. She started feeling fatigued last week.   She underwent right lumpectomy and sentinel lymph node sampling ([id number]) on 03/08/2018. At the time of her last visit on 03/09/2018, the results were still pending. Pathology from the samples showed: Invasive ductal carcinoma, grade II with a microscopic focus (less than 0.1 cm). Margins were negative. No evidence of carcinoma in 1 right axillary sentinel lymph node (0/1).   On 04/15/2018, she completed a MRI of the thoracic spine showing: Largely satisfactory post RFA and kyphoplasty appearance of the lytic vertebral body metastasis at T8. There is trace residual marrow edema and enhancement along the superior, posterior, and inferior margins of the bone cement. No epidural or extraosseous extension of disease. No other thoracic spine metastasis identified. Superimposed thoracic spine degeneration with multilevel spinal stenosis. Degenerative stenosis from T6-T7 through T9-T10 results in some spinal cord mass effect, including involvement of the left hemi cord at T8-T9, but there is no definite spinal cord signal abnormality.     REVIEW OF SYSTEMS:  Tracie [name] reports that she has been approved for short term disablility and ADA through October 2019. She is working some days during the week, She walks when she shops and outside. This  weekend, she took it easy and rested.  She denies unusual headaches, visual changes, nausea, vomiting, or dizziness. There has been no unusual cough, phlegm production, or pleurisy. This been no change in bowel or bladder habits. She denies unexplained fatigue or unexplained weight loss, bleeding, rash, or fever. A detailed review of systems was otherwise stable.      PAST MEDICAL HISTORY: Past Medical History:  Diagnosis Date  . Angina pectoris ([place])    history of   . Arthritis   . Asthma   . Cancer ([place])    right breast  . Constipation   . Diverticulitis   . HPV in female   . Hyperlipidemia   . Hypertension   . Hypothyroidism   . Wears dentures   . Wears glasses     PAST SURGICAL HISTORY: Past Surgical History:  Procedure Laterality Date  . BREAST LUMPECTOMY WITH RADIOACTIVE SEED AND SENTINEL LYMPH NODE BIOPSY Right 03/08/2018   Procedure: RIGHT BREAST RADIOACTIVE SEED GUIDED LUMPECTOMY WITH RIGHT AXILLARY RADIOACTIVE SEED TARGETED LYMPH NODE EXCISION AND SENTINEL LYMPH NODE BIOPSY, INJECT BLUE DYE RIGHT BREAST;  Surgeon: [name], MD;  Location: [facility];  Service: General;  Laterality: Right;  . CESAREAN SECTION    . COLON SURGERY  2012   sigmoidectomy  . COLOSTOMY    . COLOSTOMY TAKEDOWN  10/06/11  . IR BONE TUMOR(S)RF ABLATION  11/02/2017  . IR KYPHO THORACIC WITH BONE BIOPSY  11/02/2017  . IR RADIOLOGIST EVAL & MGMT  10/07/2017  . IR RADIOLOGIST EVAL & MGMT  11/25/2017  . PORTACATH PLACEMENT N/A 09/25/2017   Procedure: INSERTION PORT-A-CATH WITH ULTRA SOUND ERAS PATHWAY;  Surgeon: [name], MD;  Location: [facility];  Service: General;  Laterality: N/A;  . TONSILLECTOMY    . TUBAL LIGATION      FAMILY HISTORY Family History  Problem Relation Age of Onset  . Cancer Mother        rectal  . Hypertension Brother   . Hypertension Brother   . Asthma Son   . Asthma Brother    The patient has little information regarding her father. Her mother died at age 43 from a  strange cancer--the patient does not know what it was, it may well have been cervical cancer from her description. The patient has one brother, no sisters. There is no history of breast or ovarian cancer in the family to the patient's knowledge   GYNECOLOGIC HISTORY:  No LMP recorded. Patient is postmenopausal.  menarche age 61, first live birth age 89 the patient is [place] P3. She stopped having periods at age 86. She never used oral contraceptives or hormone replacement.   SOCIAL HISTORY:  Works as a Scientist, product/process development. She is divorced. Currently her son [name] lives with her. He is a Art gallery manager. The 2 other children are NAYDA RIESEN, who lives in [place] and works in the theater and media, and Khaleelah Yowell lives in [place], and is vice president of a [employer]. The patient has 5 grandchildren. She is a Psychologist, forensic.    ADVANCED DIRECTIVES: Not in place    HEALTH MAINTENANCE: Social History   Tobacco Use  . Smoking status: Current Some Day Smoker    Packs/day: 0.25    Years: 40.00    Pack  years: 10.00    Types: Cigarettes  . Smokeless tobacco: Never Used  . Tobacco comment: she is smoking 2 or more daily, she plans to use nicotine patch.   Substance Use Topics  . Alcohol use: Yes    Comment: occasional  . Drug use: No     Colonoscopy: September 2012   PAP:  Bone density:09/03/2017    Allergies  Allergen Reactions  . Fish Allergy Anaphylaxis and Swelling    Swelling of hands and feet., non shellfish allergy  . Other Anaphylaxis    Tree nuts   . Ace Inhibitors Other (See Comments) and Cough    Cold like symptoms   . Tylenol [Acetaminophen] Other (See Comments)    Headache     Current Outpatient Medications  Medication Sig Dispense Refill  . albuterol (VENTOLIN HFA) 108 (90 BASE) MCG/ACT inhaler Inhale 1-2 puffs into the lungs every 6 (six) hours as needed for wheezing or shortness of breath. (Patient taking differently: Inhale 2 puffs  into the lungs every 6 (six) hours as needed for wheezing or shortness of breath. ) 1 Inhaler 6  . Alirocumab (PRALUENT) 75 MG/ML SOPN Inject 75 mg into the muscle every 14 (fourteen) days. 6 pen 0  . amLODipine (NORVASC) 10 MG tablet Take 10 mg by mouth daily.     . cetirizine (ZYRTEC) 10 MG tablet Take 10 mg by mouth daily as needed for allergies.   11  . ibuprofen (ADVIL,MOTRIN) 200 MG tablet Take 400 mg by mouth every 6 (six) hours as needed for headache or moderate pain.    Marland Kitchen levothyroxine (SYNTHROID, LEVOTHROID) 50 MCG tablet Take 50 mcg by mouth daily.    Marland Kitchen lidocaine-prilocaine (EMLA) cream Apply 1 application topically as needed (port access).    Marland Kitchen losartan-hydrochlorothiazide (HYZAAR) 100-12.5 MG tablet Take 1 tablet by mouth daily.   3  . metoprolol succinate (TOPROL-XL) 50 MG 24 hr tablet Take 1 tablet (50 mg total) by mouth daily. 90 tablet 0  . mometasone-formoterol (DULERA) 100-5 MCG/ACT AERO Inhale 2 puffs into the lungs 2 (two) times daily. 3 Inhaler 0  . Naphazoline-Glycerin (REDNESS RELIEF OP) Place 1 drop into both eyes daily as needed (redness).    . nitroGLYCERIN (NITROSTAT) 0.4 MG SL tablet Place 0.4 mg under the tongue every 5 (five) minutes as needed for chest pain (she has not had to take this medicine in a long time. she has it at home).     . potassium chloride (K-DUR) 10 MEQ tablet Take 2 tablets (20 mEq total) by mouth daily. 60 tablet 0  . traMADol (ULTRAM) 50 MG tablet Take 1 tablet (50 mg total) by mouth every 8 (eight) hours as needed (pain). 45 tablet 0   No current facility-administered medications for this visit.     OBJECTIVE: Middle-aged African-American woman who appears stated age  22:   05/11/18 0923  BP: 123/70  Pulse: 89  Resp: 18  Temp: 98.5 F (36.9 C)  SpO2: 100%     Body mass index is 28.12 kg/m.   Wt Readings from Last 3 Encounters:  05/11/18 161 lb 4.8 oz (73.2 kg)  04/20/18 160 lb 1.9 oz (72.6 kg)  03/16/18 161 lb 6.4 oz (73.2 kg)    ECOG FS:1 - Symptomatic but completely ambulatory   Sclerae unicteric, pupils round and equal Oropharynx clear, full upper plate No cervical or supraclavicular adenopathy Lungs no rales or rhonchi Heart regular rate and rhythm Abd soft, nontender, positive bowel sounds MSK no  focal spinal tenderness, no upper extremity lymphedema Neuro: nonfocal, well oriented, appropriate affect Breasts: The right breast is status post surgery and radiation.  There is some wet desquamation in the inframammary fold.  There is hyper pigmentation as expected.  There is no evidence of residual or recurrent disease.  The left breast is benign.  Both axillae are benign.  LAB RESULTS:  CMP     Component Value Date/Time   NA 137 03/09/2018 0919   NA 138 12/24/2017 0752   K 3.3 (L) 03/09/2018 0919   K 3.3 (L) 12/24/2017 0752   CL 100 03/09/2018 0919   CO2 27 03/09/2018 0919   CO2 25 12/24/2017 0752   GLUCOSE 111 03/09/2018 0919   GLUCOSE 99 12/24/2017 0752   BUN 13 03/09/2018 0919   BUN 7.9 12/24/2017 0752   CREATININE 0.82 03/09/2018 0919   CREATININE 0.7 12/24/2017 0752   CALCIUM 10.0 03/09/2018 0919   CALCIUM 9.2 12/24/2017 0752   PROT 6.8 03/09/2018 0919   PROT 6.3 (L) 12/24/2017 0752   ALBUMIN 3.4 (L) 03/09/2018 0919   ALBUMIN 3.4 (L) 12/24/2017 0752   AST 15 03/09/2018 0919   AST 15 12/24/2017 0752   ALT 13 03/09/2018 0919   ALT 18 12/24/2017 0752   ALKPHOS 86 03/09/2018 0919   ALKPHOS 93 12/24/2017 0752   BILITOT <0.2 (L) 03/09/2018 0919   BILITOT <0.22 12/24/2017 0752   GFRNONAA >60 03/09/2018 0919   GFRAA >60 03/09/2018 0919    No results found for: Ronnald Ramp, A1GS, A2GS, BETS, BETA2SER, GAMS, MSPIKE, SPEI  No results found for: [facility], [facility]  Lab Results  Component Value Date   WBC 3.1 (L) 05/11/2018   NEUTROABS 1.8 05/11/2018   HGB 11.1 (L) 05/11/2018   HCT 33.4 (L) 05/11/2018   MCV 91.3 05/11/2018   PLT 290 05/11/2018       Chemistry      Component Value Date/Time   NA 137 03/09/2018 0919   NA 138 12/24/2017 0752   K 3.3 (L) 03/09/2018 0919   K 3.3 (L) 12/24/2017 0752   CL 100 03/09/2018 0919   CO2 27 03/09/2018 0919   CO2 25 12/24/2017 0752   BUN 13 03/09/2018 0919   BUN 7.9 12/24/2017 0752   CREATININE 0.82 03/09/2018 0919   CREATININE 0.7 12/24/2017 0752      Component Value Date/Time   CALCIUM 10.0 03/09/2018 0919   CALCIUM 9.2 12/24/2017 0752   ALKPHOS 86 03/09/2018 0919   ALKPHOS 93 12/24/2017 0752   AST 15 03/09/2018 0919   AST 15 12/24/2017 0752   ALT 13 03/09/2018 0919   ALT 18 12/24/2017 0752   BILITOT <0.2 (L) 03/09/2018 0919   BILITOT <0.22 12/24/2017 0752       No results found for: LABCA2  No components found for: [id number]    Appointment on 05/11/2018  Component Date Value Ref Range Status  . WBC 05/11/2018 3.1* 3.9 - 10.3 K/uL Final  . RBC 05/11/2018 3.66* 3.70 - 5.45 MIL/uL Final  . Hemoglobin 05/11/2018 11.1* 11.6 - 15.9 g/dL Final  . HCT 05/11/2018 33.4* 34.8 - 46.6 % Final  . MCV 05/11/2018 91.3  79.5 - 101.0 fL Final  . MCH 05/11/2018 30.4  25.1 - 34.0 pg Final  . MCHC 05/11/2018 33.3  31.5 - 36.0 g/dL Final  . RDW 05/11/2018 20.7* 11.2 - 14.5 % Final  . Platelets 05/11/2018 290  145 - 400 K/uL Final  . Neutrophils Relative % 05/11/2018 57  %  Final  . Neutro Abs 05/11/2018 1.8  1.5 - 6.5 K/uL Final  . Lymphocytes Relative 05/11/2018 21  % Final  . Lymphs Abs 05/11/2018 0.6* 0.9 - 3.3 K/uL Final  . Monocytes Relative 05/11/2018 16  % Final  . Monocytes Absolute 05/11/2018 0.5  0.1 - 0.9 K/uL Final  . Eosinophils Relative 05/11/2018 5  % Final  . Eosinophils Absolute 05/11/2018 0.2  0.0 - 0.5 K/uL Final  . Basophils Relative 05/11/2018 1  % Final  . Basophils Absolute 05/11/2018 0.0  0.0 - 0.1 K/uL Final   Performed at [facility], [address] [place], [place] [address]    (this displays the last labs from the last 3 days)  No  results found for: TOTALPROTELP, ALBUMINELP, A1GS, A2GS, BETS, BETA2SER, GAMS, MSPIKE, SPEI (this displays SPEP labs)  No results found for: KPAFRELGTCHN, LAMBDASER, KAPLAMBRATIO (kappa/lambda light chains)  No results found for: HGBA, HGBA2QUANT, HGBFQUANT, HGBSQUAN (Hemoglobinopathy evaluation)   No results found for: LDH  No results found for: IRON, TIBC, IRONPCTSAT (Iron and TIBC)  No results found for: FERRITIN  Urinalysis    Component Value Date/Time   LABSPEC 1.020 02/10/2011 2058   PHURINE 6.0 02/10/2011 2058   HGBUR LARGE (A) 02/10/2011 2058   BILIRUBINUR MODERATE (A) 02/10/2011 2058   KETONESUR 15 (A) 02/10/2011 2058   PROTEINUR >=300 (A) 02/10/2011 2058   UROBILINOGEN >=8.0 02/10/2011 2058   NITRITE NEGATIVE 02/10/2011 2058   LEUKOCYTESUR  02/10/2011 2058    NEGATIVE Biochemical Testing Only. Please order routine urinalysis from main lab if confirmatory testing is needed.     STUDIES: Mr Thoracic Spine W Wo Contrast  Result Date: 04/15/2018 CLINICAL DATA:  67 year old female with metastatic breast cancer. Pathologic fracture at T8 status post percutaneous bone biopsy, radiofrequency ablation, and kyphoplasty at that level. Study for stereotactic surgical planning. Creatinine was obtained on site at [facility] at [address] Results: Creatinine 1.0 mg/dL. EXAM: MRI THORACIC WITHOUT AND WITH CONTRAST TECHNIQUE: Multiplanar and multiecho pulse sequences of the thoracic spine were obtained without and with intravenous contrast. CONTRAST:  79m MULTIHANCE GADOBENATE DIMEGLUMINE 529 MG/ML IV SOLN COMPARISON:  PET-CT 11/30/2017. T8 vertebral procedure images 11/02/17. CTA chest 10/13/2017. Whole-body bone scan 09/24/2017. FINDINGS: Limited cervical spine imaging: Straightening of cervical lordosis with moderate to severe disc space loss and endplate spurring at CU5-K2and C5-C6. Visible cervical spine bone marrow signal appears to be normal. Thoracic spine  segmentation: Normal, corresponding to the numbering system on the comparison. Alignment: Mild multilevel upper thoracic spine degenerative spondylolisthesis. Preserved midthoracic kyphosis. Vertebrae: Sagittal imaging demonstrates thoracic vertebral marrow signal to be within normal limits above and below the T8 level; there is degenerative appearing endplate marrow signal changes at T6-T7, T8-T9, T9-T10 (most pronounced, posterior and posterolateral endplate), and T10-T11. Chronic disc space loss at those levels. Visible posterior rib marrow signal also appears normal. Sequelae of T8 vertebral body kyphoplasty with bone cement occupying the central and lower aspect of the body. There is only trace surrounding marrow edema along the posterior and superior margin of the cement (series 24, image 7). Following contrast, there is corresponding mild enhancement along both the posterosuperior and posterior inferior margins of the methylmethacrylate (series 27, images 18 and 22). There is no extraosseous extension of disease identified. The T8 pedicles and posterior elements appear normal. Cord: No definite spinal cord signal abnormality. This despite multilevel degenerative thoracic spinal stenosis with up to mild spinal cord mass effect at T2-T3,  T4-T5, and then from T6-T7 through T9-T10. Note degenerative left hemi cord mass effect at T8-T9, series 25, image 25). The the conus medullaris appears normal on the sagittal images at T12-L1. Paraspinal and other soft tissues: Cardiomegaly is evident. Otherwise negative. Disc levels: Widespread thoracic spine degeneration. Levels of degenerative spinal stenosis as stated above. IMPRESSION: 1. Largely satisfactory post RFA and kyphoplasty appearance of the lytic vertebral body metastasis at T8. There is trace residual marrow edema and enhancement along the superior, posterior, and inferior margins of the bone cement. No epidural or extraosseous extension of disease. 2. No  other thoracic spine metastasis identified. 3. Superimposed thoracic spine degeneration with multilevel spinal stenosis. Degenerative stenosis from T6-T7 through T9-T10 results in some spinal cord mass effect, including involvement of the left hemi cord at T8-T9, but there is no definite spinal cord signal abnormality. Electronically Signed   By: Genevie Ann M.D.   On: 04/15/2018 13:31    ELIGIBLE FOR AVAILABLE RESEARCH PROTOCOL: no  ASSESSMENT: 67 y.o. [place] woman status post right breast upper outer quadrant and right axillary lymph node biopsy 09/03/2017, both positive for invasive ductal carcinoma, grade 3, estrogen receptor positive, progesterone receptor negative, with an MIB-1 of 90%, the lymph node being HER-2 positive, the breast mass HER-2 negative  (a) staging studies September 24, 2017 show a lytic lesion in T8, possible areas of concern at L1-L2  (b) biopsy/kyphoplasty/osteocool of T8 lesion 11/02/2017 confirms metastatic adenocarcinoma, 20% estrogen receptor positive, estrogen receptor negative  (c) PET scan 11/30/2017 shows no visceral disease, minimal metabolic activity at T8, no other bone lesions  (1) neoadjuvant chemotherapy with carboplatin, docetaxel, trastuzumab, and pertuzumab started 10/01/2017, completed 6 cycles 01/26/2018  (a) Docetaxel dropped after three cycles and Gemcitabine/Carbo started on 12/15/2017.   (2) trastuzumab to continue indefinitely  (a) echocardiogram 11/38/2018 shows an ejection fraction in the 55-60% range  (b) echocardiogram 03/01/2018 shows an ejection fraction in the 60-65% range  (c) echocardiogram 06/04/2018 pending  (3) right lumpectomy and sentinel lymph node sampling 03/08/2018  Showed a pT1(mic) pN0 invasive ductal carcinoma, grade 2, with negative margins.  (4) adjuvant radiation completed 05/11/2018  (a) radiation to her oligo metastatic disease at T8 (18 Gy)  (5) anastrozole to start 05/29/2018  (6) zolendronate to start 06/01/2018,  repeated every 12 weeks  PLAN: Tracie [name] has now completed her chemotherapy and her radiation treatments.  This is of course of more intense and more difficult part of therapy.  She is going to be on anti-HER-2 treatment indefinitely.  She is tolerating Herceptin well.  She will receive a dose today and then every 21 days until she completes a year at which point I will switch her to every 4 weeks.  She is already scheduled for her next echocardiogram in June.  She is now ready to start antiestrogens.  We discussed anastrozole in detail and she will start that medication [id number] 2019.  She will let us know if she has any unusual side effects.  With her next Herceptin dose she will have her first zolendronate dose.  She understands that this may cause bony aches and pains for a couple of days, particularly the first dose, and can also cause hypocalcemia.  I asked her to take Tums 4 times daily on the day of treatment.  Of course zolendronate can also cause osteonecrosis of the jaw.  I have asked her to see her dentist before we start zolendronate to make sure she does not need any extractions.  She  will try to get that accomplished  She will then see [place] again in June and in August.  Before the August visit I have set her up for a chest CT, a brain MRI, and a bone scan.  She knows to call for any other issues that may develop before the next visit.      Sanel Stemmer, [name], MD  05/11/18 9:44 AM Medical Oncology and Hematology [facility] [address] [place], [place] [address] Tel. [phone]    Fax. [phone]  This document serves as a record of services personally performed by Lurline Del, MD. It was created on his behalf by Sheron Nightingale, a trained medical scribe. The creation of this record is based on the scribe's personal observations and the provider's statements to them.   I have reviewed the above documentation for accuracy and completeness, and I agree  with the above.

## 2018-05-05 ENCOUNTER — Ambulatory Visit
Admission: RE | Admit: 2018-05-05 | Discharge: 2018-05-05 | Disposition: A | Discharge status: Home / Self Care | Payer: 59 | Source: Ambulatory Visit | Attending: Radiation Oncology | Admitting: Radiation Oncology

## 2018-05-05 DIAGNOSIS — C50411 Malignant neoplasm of upper-outer quadrant of right female breast: Secondary | ICD-10-CM | POA: Diagnosis not present

## 2018-05-06 ENCOUNTER — Ambulatory Visit
Admission: RE | Admit: 2018-05-06 | Discharge: 2018-05-06 | Disposition: A | Discharge status: Home / Self Care | Payer: 59 | Source: Ambulatory Visit | Attending: Radiation Oncology | Admitting: Radiation Oncology

## 2018-05-06 DIAGNOSIS — C50411 Malignant neoplasm of upper-outer quadrant of right female breast: Secondary | ICD-10-CM | POA: Diagnosis not present

## 2018-05-07 ENCOUNTER — Ambulatory Visit
Admission: RE | Admit: 2018-05-07 | Discharge: 2018-05-07 | Disposition: A | Discharge status: Home / Self Care | Payer: 59 | Source: Ambulatory Visit | Attending: Radiation Oncology | Admitting: Radiation Oncology

## 2018-05-07 DIAGNOSIS — C50411 Malignant neoplasm of upper-outer quadrant of right female breast: Secondary | ICD-10-CM | POA: Diagnosis not present

## 2018-05-10 ENCOUNTER — Other Ambulatory Visit: Payer: Self-pay | Admitting: Radiation Oncology

## 2018-05-10 ENCOUNTER — Ambulatory Visit
Admission: RE | Admit: 2018-05-10 | Discharge: 2018-05-10 | Disposition: A | Discharge status: Home / Self Care | Payer: 59 | Source: Ambulatory Visit | Attending: Radiation Oncology | Admitting: Radiation Oncology

## 2018-05-10 DIAGNOSIS — C50411 Malignant neoplasm of upper-outer quadrant of right female breast: Secondary | ICD-10-CM

## 2018-05-10 DIAGNOSIS — Z170 Estrogen receptor positive status [ER+]: Principal | ICD-10-CM

## 2018-05-10 MED ORDER — SILVER SULFADIAZINE 1 % EX CREA
TOPICAL_CREAM | Freq: Two times a day (BID) | CUTANEOUS | Status: DC
  Administered 2018-05-10: 11:00:00 via TOPICAL

## 2018-05-10 MED ORDER — TRAMADOL HCL 50 MG PO TABS: 50.0000 mg | ORAL_TABLET | Freq: Three times a day (TID) | ORAL | 0 refills | Status: DC | PRN

## 2018-05-11 ENCOUNTER — Inpatient Hospital Stay (HOSPITAL_BASED_OUTPATIENT_CLINIC_OR_DEPARTMENT_OTHER): Payer: 59 | Admitting: Oncology

## 2018-05-11 ENCOUNTER — Ambulatory Visit
Admission: RE | Admit: 2018-05-11 | Discharge: 2018-05-11 | Disposition: A | Discharge status: Home / Self Care | Payer: 59 | Source: Ambulatory Visit | Attending: Radiation Oncology | Admitting: Radiation Oncology

## 2018-05-11 ENCOUNTER — Inpatient Hospital Stay (HOSPITAL_BASED_OUTPATIENT_CLINIC_OR_DEPARTMENT_OTHER): Payer: 59 | Admitting: Medical

## 2018-05-11 ENCOUNTER — Inpatient Hospital Stay: Payer: 59

## 2018-05-11 ENCOUNTER — Ambulatory Visit: Payer: Self-pay | Admitting: Radiation Oncology

## 2018-05-11 ENCOUNTER — Inpatient Hospital Stay: Payer: 59 | Attending: Oncology

## 2018-05-11 VITALS — BP 123/70 | HR 89 | Temp 98.5°F | Resp 18 | Ht 63.5 in | Wt 161.3 lb

## 2018-05-11 DIAGNOSIS — F17210 Nicotine dependence, cigarettes, uncomplicated: Secondary | ICD-10-CM | POA: Diagnosis not present

## 2018-05-11 DIAGNOSIS — J0100 Acute maxillary sinusitis, unspecified: Secondary | ICD-10-CM

## 2018-05-11 DIAGNOSIS — Z170 Estrogen receptor positive status [ER+]: Secondary | ICD-10-CM

## 2018-05-11 DIAGNOSIS — C50411 Malignant neoplasm of upper-outer quadrant of right female breast: Secondary | ICD-10-CM

## 2018-05-11 DIAGNOSIS — Z923 Personal history of irradiation: Secondary | ICD-10-CM | POA: Diagnosis not present

## 2018-05-11 DIAGNOSIS — C7951 Secondary malignant neoplasm of bone: Secondary | ICD-10-CM | POA: Insufficient documentation

## 2018-05-11 DIAGNOSIS — Z9221 Personal history of antineoplastic chemotherapy: Secondary | ICD-10-CM

## 2018-05-11 DIAGNOSIS — Z5112 Encounter for antineoplastic immunotherapy: Secondary | ICD-10-CM | POA: Diagnosis not present

## 2018-05-11 LAB — CBC WITH DIFFERENTIAL/PLATELET
BASOS ABS: 0 10*3/uL (ref 0.0–0.1)
Basophils Relative: 1 %
Eosinophils Absolute: 0.2 10*3/uL (ref 0.0–0.5)
Eosinophils Relative: 5 %
HEMATOCRIT: 33.4 % — AB (ref 34.8–46.6)
Hemoglobin: 11.1 g/dL — ABNORMAL LOW (ref 11.6–15.9)
LYMPHS PCT: 21 %
Lymphs Abs: 0.6 10*3/uL — ABNORMAL LOW (ref 0.9–3.3)
MCH: 30.4 pg (ref 25.1–34.0)
MCHC: 33.3 g/dL (ref 31.5–36.0)
MCV: 91.3 fL (ref 79.5–101.0)
Monocytes Absolute: 0.5 10*3/uL (ref 0.1–0.9)
Monocytes Relative: 16 %
Neutro Abs: 1.8 10*3/uL (ref 1.5–6.5)
Neutrophils Relative %: 57 %
Platelets: 290 10*3/uL (ref 145–400)
RBC: 3.66 MIL/uL — AB (ref 3.70–5.45)
RDW: 20.7 % — ABNORMAL HIGH (ref 11.2–14.5)
WBC: 3.1 10*3/uL — AB (ref 3.9–10.3)

## 2018-05-11 LAB — COMPREHENSIVE METABOLIC PANEL
ALBUMIN: 3.5 g/dL (ref 3.5–5.0)
ALK PHOS: 84 U/L (ref 40–150)
ALT: 11 U/L (ref 0–55)
ANION GAP: 9 (ref 3–11)
AST: 12 U/L (ref 5–34)
BUN: 13 mg/dL (ref 7–26)
CALCIUM: 9.9 mg/dL (ref 8.4–10.4)
CO2: 26 mmol/L (ref 22–29)
Chloride: 103 mmol/L (ref 98–109)
Creatinine, Ser: 0.88 mg/dL (ref 0.60–1.10)
GFR calc non Af Amer: >60 mL/min (ref >60)
Glucose, Bld: 103 mg/dL (ref 70–140)
POTASSIUM: 3.3 mmol/L — AB (ref 3.5–5.1)
SODIUM: 138 mmol/L (ref 136–145)
Total Bilirubin: <0.2 mg/dL — ABNORMAL LOW (ref 0.2–1.2)
Total Protein: 7 g/dL (ref 6.4–8.3)

## 2018-05-11 MED ORDER — SODIUM CHLORIDE 0.9% FLUSH
10.0000 mL | INTRAVENOUS | Status: DC | PRN
  Administered 2018-05-11: 10 mL
  Filled 2018-05-11: qty 10

## 2018-05-11 MED ORDER — HEPARIN SOD (PORK) LOCK FLUSH 100 UNIT/ML IV SOLN
500.0000 [IU] | Freq: Once | INTRAVENOUS | Status: AC | PRN
  Administered 2018-05-11: 500 [IU]
  Filled 2018-05-11: qty 5

## 2018-05-11 MED ORDER — SODIUM CHLORIDE 0.9 % IV SOLN
Freq: Once | INTRAVENOUS | Status: AC
  Administered 2018-05-11: 11:00:00 via INTRAVENOUS

## 2018-05-11 MED ORDER — AMOXICILLIN-POT CLAVULANATE 875-125 MG PO TABS: 1.0000 | ORAL_TABLET | Freq: Two times a day (BID) | ORAL | 0 refills | Status: DC

## 2018-05-11 MED ORDER — DIPHENHYDRAMINE HCL 25 MG PO CAPS
ORAL_CAPSULE | ORAL | Status: AC
  Filled 2018-05-11: qty 1

## 2018-05-11 MED ORDER — ACETAMINOPHEN 325 MG PO TABS
650.0000 mg | ORAL_TABLET | Freq: Once | ORAL | Status: AC
  Administered 2018-05-11: 650 mg via ORAL

## 2018-05-11 MED ORDER — ACETAMINOPHEN 325 MG PO TABS
ORAL_TABLET | ORAL | Status: AC
  Filled 2018-05-11: qty 2

## 2018-05-11 MED ORDER — ANASTROZOLE 1 MG PO TABS: 1.0000 mg | ORAL_TABLET | Freq: Every day | ORAL | 4 refills | Status: DC

## 2018-05-11 MED ORDER — SODIUM CHLORIDE 0.9% FLUSH
10.0000 mL | Freq: Once | INTRAVENOUS | Status: AC
  Administered 2018-05-11: 10 mL
  Filled 2018-05-11: qty 10

## 2018-05-11 MED ORDER — DIPHENHYDRAMINE HCL 25 MG PO CAPS
25.0000 mg | ORAL_CAPSULE | Freq: Once | ORAL | Status: AC
  Administered 2018-05-11: 25 mg via ORAL

## 2018-05-11 MED ORDER — TRASTUZUMAB CHEMO 150 MG IV SOLR
450.0000 mg | Freq: Once | INTRAVENOUS | Status: AC
  Administered 2018-05-11: 450 mg via INTRAVENOUS
  Filled 2018-05-11: qty 21.43

## 2018-05-11 NOTE — Patient Instructions (Signed)
Implanted Port Home Guide An implanted port is a type of central line that is placed under the skin. Central lines are used to provide IV access when treatment or nutrition needs to be given through a person's veins. Implanted ports are used for long-term IV access. An implanted port may be placed because:  You need IV medicine that would be irritating to the small veins in your hands or arms.  You need long-term IV medicines, such as antibiotics.  You need IV nutrition for a long period.  You need frequent blood draws for lab tests.  You need dialysis.  Implanted ports are usually placed in the chest area, but they can also be placed in the upper arm, the abdomen, or the leg. An implanted port has two main parts:  Reservoir. The reservoir is round and will appear as a small, raised area under your skin. The reservoir is the part where a needle is inserted to give medicines or draw blood.  Catheter. The catheter is a thin, flexible tube that extends from the reservoir. The catheter is placed into a large vein. Medicine that is inserted into the reservoir goes into the catheter and then into the vein.  How will I care for my incision site? Do not get the incision site wet. Bathe or shower as directed by your health care provider. How is my port accessed? Special steps must be taken to access the port:  Before the port is accessed, a numbing cream can be placed on the skin. This helps numb the skin over the port site.  Your health care provider uses a sterile technique to access the port. ? Your health care provider must put on a mask and sterile gloves. ? The skin over your port is cleaned carefully with an antiseptic and allowed to dry. ? The port is gently pinched between sterile gloves, and a needle is inserted into the port.  Only "non-coring" port needles should be used to access the port. Once the port is accessed, a blood return should be checked. This helps ensure that the port  is in the vein and is not clogged.  If your port needs to remain accessed for a constant infusion, a clear (transparent) bandage will be placed over the needle site. The bandage and needle will need to be changed every week, or as directed by your health care provider.  Keep the bandage covering the needle clean and dry. Do not get it wet. Follow your health care provider's instructions on how to take a shower or bath while the port is accessed.  If your port does not need to stay accessed, no bandage is needed over the port.  What is flushing? Flushing helps keep the port from getting clogged. Follow your health care provider's instructions on how and when to flush the port. Ports are usually flushed with saline solution or a medicine called heparin. The need for flushing will depend on how the port is used.  If the port is used for intermittent medicines or blood draws, the port will need to be flushed: ? After medicines have been given. ? After blood has been drawn. ? As part of routine maintenance.  If a constant infusion is running, the port may not need to be flushed.  How long will my port stay implanted? The port can stay in for as long as your health care provider thinks it is needed. When it is time for the port to come out, surgery will be   done to remove it. The procedure is similar to the one performed when the port was put in. When should I seek immediate medical care? When you have an implanted port, you should seek immediate medical care if:  You notice a bad smell coming from the incision site.  You have swelling, redness, or drainage at the incision site.  You have more swelling or pain at the port site or the surrounding area.  You have a fever that is not controlled with medicine.  This information is not intended to replace advice given to you by your health care provider. Make sure you discuss any questions you have with your health care provider. Document  Released: 12/15/2005 Document Revised: 05/22/2016 Document Reviewed: 08/22/2013 Elsevier Interactive Patient Education  2017 Elsevier Inc.  

## 2018-05-12 ENCOUNTER — Ambulatory Visit: Payer: 59

## 2018-05-12 NOTE — Progress Notes (Signed)
[facility] Progress Note   Tracie [name] [id number] [birth year] 67 y.o. Dr. Valetta Close is managed by Dr. Jana Hakim  Actively treated with chemotherapy: yes  Current Therapy: Trastuzumab and adjuvant radiation therapy  Last Treated: 05/11/2018  Assessment: Plan:    Acute non-recurrent maxillary sinusitis - Plan: amoxicillin-clavulanate (AUGMENTIN) 875-125 MG tablet   Acute maxillary sinusitis: Patient was given a prescription for Augmentin 875-125 p.o. twice daily x10 days.  She was told to eat yogurt or begin a probiotic should she have GI upset.  Please see After Visit Summary for patient specific instructions.  Future Appointments  Date Time Provider Victoria Vera  06/01/2018  8:00 AM [facility] A1 [facility] None  06/04/2018 10:00 AM [facility] ECHO 1-BUZZ [facility]  06/04/2018 11:00 AM [name], MD [facility] None  06/11/2018 10:20 AM [name], MD [facility] None  06/11/2018  2:40 PM [name], MD [facility] None  06/22/2018  8:00 AM [facility] A1 [facility] None    No orders of the defined types were placed in this encounter.      Subjective:   Patient ID:  Tracie [name] is a 67 y.o. (DOB 1951/06/19) female.  Chief Complaint: No chief complaint on file.   HPI Tracie [name] is a 66 year old female with a history of a triple positive metastatic breast cancer who is treated with trastuzumab and adjuvant radiation therapy.  She was seen by Dr. Jana Hakim today.  She reports that she forgot to mention that she was having symptoms of acute sinusitis.  She is having pain and pressure in the right maxillary sinus with postnasal drainage.  She denies fevers, chills, or sweats.  She is not having any upper tooth pain.  Medications: I have reviewed the patient's current medications.  Allergies:  Allergies  Allergen Reactions  . Fish Allergy Anaphylaxis and Swelling    Swelling of hands and feet., non shellfish allergy   . Other Anaphylaxis    Tree nuts   . Ace Inhibitors Other (See Comments) and Cough    Cold like symptoms   . Tylenol [Acetaminophen] Other (See Comments)    Headache     Past Medical History:  Diagnosis Date  . Angina pectoris ([place])    history of   . Arthritis   . Asthma   . Cancer ([place])    right breast  . Constipation   . Diverticulitis   . HPV in female   . Hyperlipidemia   . Hypertension   . Hypothyroidism   . Wears dentures   . Wears glasses     Past Surgical History:  Procedure Laterality Date  . BREAST LUMPECTOMY WITH RADIOACTIVE SEED AND SENTINEL LYMPH NODE BIOPSY Right 03/08/2018   Procedure: RIGHT BREAST RADIOACTIVE SEED GUIDED LUMPECTOMY WITH RIGHT AXILLARY RADIOACTIVE SEED TARGETED LYMPH NODE EXCISION AND SENTINEL LYMPH NODE BIOPSY, INJECT BLUE DYE RIGHT BREAST;  Surgeon: [name], MD;  Location: [facility];  Service: General;  Laterality: Right;  . CESAREAN SECTION    . COLON SURGERY  2012   sigmoidectomy  . COLOSTOMY    . COLOSTOMY TAKEDOWN  10/06/11  . IR BONE TUMOR(S)RF ABLATION  11/02/2017  . IR KYPHO THORACIC WITH BONE BIOPSY  11/02/2017  . IR RADIOLOGIST EVAL & MGMT  10/07/2017  . IR RADIOLOGIST EVAL & MGMT  11/25/2017  . PORTACATH PLACEMENT N/A 09/25/2017   Procedure: INSERTION PORT-A-CATH WITH ULTRA SOUND ERAS PATHWAY;  Surgeon: [name], MD;  Location: [facility];  Service: General;  Laterality: N/A;  . TONSILLECTOMY    . TUBAL LIGATION      Family History  Problem Relation Age of Onset  . Cancer Mother        rectal  . Hypertension Brother   . Hypertension Brother   . Asthma Son   . Asthma Brother     Social History   Socioeconomic History  . Marital status: Divorced    Spouse name: Not on file  . Number of children: Not on file  . Years of education: Not on file  . Highest education level: Not on file  Occupational History  . Not on file  Social Needs  . Financial resource strain: Not on file  . Food insecurity:    Worry: Not  on file    Inability: Not on file  . Transportation needs:    Medical: Not on file    Non-medical: Not on file  Tobacco Use  . Smoking status: Current Some Day Smoker    Packs/day: 0.25    Years: 40.00    Pack years: 10.00    Types: Cigarettes  . Smokeless tobacco: Never Used  . Tobacco comment: she is smoking 2 or more daily, she plans to use nicotine patch.   Substance and Sexual Activity  . Alcohol use: Yes    Comment: occasional  . Drug use: No  . Sexual activity: Not on file  Lifestyle  . Physical activity:    Days per week: Not on file    Minutes per session: Not on file  . Stress: Not on file  Relationships  . Social connections:    Talks on phone: Not on file    Gets together: Not on file    Attends religious service: Not on file    Active member of club or organization: Not on file    Attends meetings of clubs or organizations: Not on file    Relationship status: Not on file  . Intimate partner violence:    Fear of current or ex partner: Not on file    Emotionally abused: Not on file    Physically abused: Not on file    Forced sexual activity: Not on file  Other Topics Concern  . Not on file  Social History Narrative  . Not on file    Past Medical History, Surgical history, Social history, and Family history were reviewed and updated as appropriate.   Please see review of systems for further details on the patient's review from today.   Review of Systems:  Review of Systems  Constitutional: Negative for chills, diaphoresis and fever.  HENT: Positive for postnasal drip, sinus pressure and sinus pain. Negative for congestion, rhinorrhea, sneezing and sore throat.   Respiratory: Negative for cough and shortness of breath.   Neurological: Positive for headaches.    Objective:   Physical Exam:  There were no vitals taken for this visit. ECOG: 0  Physical Exam  Constitutional: No distress.  HENT:  Head: Normocephalic and atraumatic.  Nose: Right  sinus exhibits maxillary sinus tenderness. Right sinus exhibits no frontal sinus tenderness. Left sinus exhibits no maxillary sinus tenderness and no frontal sinus tenderness.  Mouth/Throat: No oropharyngeal exudate.  Cardiovascular: Normal rate, regular rhythm and normal heart sounds. Exam reveals no gallop and no friction rub.  No murmur heard. Pulmonary/Chest: Effort normal and breath sounds normal. No respiratory distress. She has no wheezes. She has no rales.  Neurological: She is alert.  Skin: Skin is warm and dry.  No rash noted. She is not diaphoretic. No erythema.    Lab Review:     Component Value Date/Time   NA 138 05/11/2018 0856   NA 138 12/24/2017 0752   K 3.3 (L) 05/11/2018 0856   K 3.3 (L) 12/24/2017 0752   CL 103 05/11/2018 0856   CO2 26 05/11/2018 0856   CO2 25 12/24/2017 0752   GLUCOSE 103 05/11/2018 0856   GLUCOSE 99 12/24/2017 0752   BUN 13 05/11/2018 0856   BUN 7.9 12/24/2017 0752   CREATININE 0.88 05/11/2018 0856   CREATININE 0.7 12/24/2017 0752   CALCIUM 9.9 05/11/2018 0856   CALCIUM 9.2 12/24/2017 0752   PROT 7.0 05/11/2018 0856   PROT 6.3 (L) 12/24/2017 0752   ALBUMIN 3.5 05/11/2018 0856   ALBUMIN 3.4 (L) 12/24/2017 0752   AST 12 05/11/2018 0856   AST 15 12/24/2017 0752   ALT 11 05/11/2018 0856   ALT 18 12/24/2017 0752   ALKPHOS 84 05/11/2018 0856   ALKPHOS 93 12/24/2017 0752   BILITOT <0.2 (L) 05/11/2018 0856   BILITOT <0.22 12/24/2017 0752   GFRNONAA >60 05/11/2018 0856   GFRAA >60 05/11/2018 0856       Component Value Date/Time   WBC 3.1 (L) 05/11/2018 0856   RBC 3.66 (L) 05/11/2018 0856   HGB 11.1 (L) 05/11/2018 0856   HGB 8.9 (L) 12/24/2017 0752   HCT 33.4 (L) 05/11/2018 0856   HCT 27.9 (L) 12/24/2017 0752   PLT 290 05/11/2018 0856   PLT 140 (L) 12/24/2017 0752   MCV 91.3 05/11/2018 0856   MCV 94.3 12/24/2017 0752   MCH 30.4 05/11/2018 0856   MCHC 33.3 05/11/2018 0856   RDW 20.7 (H) 05/11/2018 0856   RDW 19.8 (H) 12/24/2017 0752     LYMPHSABS 0.6 (L) 05/11/2018 0856   LYMPHSABS 1.7 12/24/2017 0752   MONOABS 0.5 05/11/2018 0856   MONOABS 0.2 12/24/2017 0752   EOSABS 0.2 05/11/2018 0856   EOSABS 0.1 12/24/2017 0752   BASOSABS 0.0 05/11/2018 0856   BASOSABS 0.0 12/24/2017 0752   -------------------------------  Imaging from last 24 hours (if applicable):  Radiology interpretation: Mr Thoracic Spine W Wo Contrast  Result Date: 04/15/2018 CLINICAL DATA:  67 year old female with metastatic breast cancer. Pathologic fracture at T8 status post percutaneous bone biopsy, radiofrequency ablation, and kyphoplasty at that level. Study for stereotactic surgical planning. Creatinine was obtained on site at [facility] at [address] Results: Creatinine 1.0 mg/dL. EXAM: MRI THORACIC WITHOUT AND WITH CONTRAST TECHNIQUE: Multiplanar and multiecho pulse sequences of the thoracic spine were obtained without and with intravenous contrast. CONTRAST:  76mL MULTIHANCE GADOBENATE DIMEGLUMINE 529 MG/ML IV SOLN COMPARISON:  PET-CT 11/30/2017. T8 vertebral procedure images 11/02/17. CTA chest 10/13/2017. Whole-body bone scan 09/24/2017. FINDINGS: Limited cervical spine imaging: Straightening of cervical lordosis with moderate to severe disc space loss and endplate spurring at A2-Z3 and C5-C6. Visible cervical spine bone marrow signal appears to be normal. Thoracic spine segmentation: Normal, corresponding to the numbering system on the comparison. Alignment: Mild multilevel upper thoracic spine degenerative spondylolisthesis. Preserved midthoracic kyphosis. Vertebrae: Sagittal imaging demonstrates thoracic vertebral marrow signal to be within normal limits above and below the T8 level; there is degenerative appearing endplate marrow signal changes at T6-T7, T8-T9, T9-T10 (most pronounced, posterior and posterolateral endplate), and T10-T11. Chronic disc space loss at those levels. Visible posterior rib marrow signal also appears normal.  Sequelae of T8 vertebral body kyphoplasty with bone cement occupying the central and lower aspect  of the body. There is only trace surrounding marrow edema along the posterior and superior margin of the cement (series 24, image 7). Following contrast, there is corresponding mild enhancement along both the posterosuperior and posterior inferior margins of the methylmethacrylate (series 27, images 18 and 22). There is no extraosseous extension of disease identified. The T8 pedicles and posterior elements appear normal. Cord: No definite spinal cord signal abnormality. This despite multilevel degenerative thoracic spinal stenosis with up to mild spinal cord mass effect at T2-T3, T4-T5, and then from T6-T7 through T9-T10. Note degenerative left hemi cord mass effect at T8-T9, series 25, image 25). The the conus medullaris appears normal on the sagittal images at T12-L1. Paraspinal and other soft tissues: Cardiomegaly is evident. Otherwise negative. Disc levels: Widespread thoracic spine degeneration. Levels of degenerative spinal stenosis as stated above. IMPRESSION: 1. Largely satisfactory post RFA and kyphoplasty appearance of the lytic vertebral body metastasis at T8. There is trace residual marrow edema and enhancement along the superior, posterior, and inferior margins of the bone cement. No epidural or extraosseous extension of disease. 2. No other thoracic spine metastasis identified. 3. Superimposed thoracic spine degeneration with multilevel spinal stenosis. Degenerative stenosis from T6-T7 through T9-T10 results in some spinal cord mass effect, including involvement of the left hemi cord at T8-T9, but there is no definite spinal cord signal abnormality. Electronically Signed   By: Genevie Ann M.D.   On: 04/15/2018 13:31

## 2018-05-13 ENCOUNTER — Ambulatory Visit: Payer: 59

## 2018-05-14 ENCOUNTER — Telehealth: Payer: Self-pay | Admitting: Oncology

## 2018-05-14 ENCOUNTER — Ambulatory Visit: Payer: 59

## 2018-05-14 NOTE — Telephone Encounter (Signed)
Added additional appointments June thru August per 5/14 los. Spoke with patient she is aware and will get updated schedule at next visit 6/4.

## 2018-05-14 NOTE — Telephone Encounter (Signed)
Central radiology will call patient re scans.

## 2018-05-17 ENCOUNTER — Ambulatory Visit: Payer: 59

## 2018-05-18 ENCOUNTER — Encounter: Payer: Self-pay | Admitting: Radiation Oncology

## 2018-05-18 ENCOUNTER — Ambulatory Visit: Payer: 59

## 2018-05-29 ENCOUNTER — Other Ambulatory Visit: Payer: Self-pay | Admitting: Oncology

## 2018-05-31 ENCOUNTER — Telehealth: Payer: Self-pay | Admitting: *Deleted

## 2018-05-31 ENCOUNTER — Other Ambulatory Visit: Payer: Self-pay | Admitting: *Deleted

## 2018-05-31 NOTE — Telephone Encounter (Signed)
This RN attempted to return call to pt per her VM inquiring about receiving Zometa at infusion appointment -tomorrow.  Tracie [name] is also receiving herceptin q 3 weeks.  Per message - Tracie [name] wanted MD to know she will not be seen by the dentist until 06/03/2018.  This RN obtained identified VM- message left for pt to come in as scheduled for herceptin- zometa will be held until her next infusion on 06/22/2018.  This RN contacted pharmacy and informed them of above - they will place a hold on the zometa for 06/01/2018.  MD will be made aware of the above.

## 2018-06-01 ENCOUNTER — Inpatient Hospital Stay: Payer: 59 | Attending: Oncology

## 2018-06-01 ENCOUNTER — Inpatient Hospital Stay: Payer: 59

## 2018-06-01 ENCOUNTER — Other Ambulatory Visit: Payer: Self-pay

## 2018-06-01 VITALS — BP 137/61 | HR 70 | Temp 98.4°F | Resp 16

## 2018-06-01 DIAGNOSIS — Z5112 Encounter for antineoplastic immunotherapy: Secondary | ICD-10-CM | POA: Diagnosis present

## 2018-06-01 DIAGNOSIS — Z170 Estrogen receptor positive status [ER+]: Principal | ICD-10-CM

## 2018-06-01 DIAGNOSIS — C773 Secondary and unspecified malignant neoplasm of axilla and upper limb lymph nodes: Secondary | ICD-10-CM | POA: Diagnosis not present

## 2018-06-01 DIAGNOSIS — C7931 Secondary malignant neoplasm of brain: Secondary | ICD-10-CM | POA: Insufficient documentation

## 2018-06-01 DIAGNOSIS — Z79811 Long term (current) use of aromatase inhibitors: Secondary | ICD-10-CM | POA: Diagnosis not present

## 2018-06-01 DIAGNOSIS — E876 Hypokalemia: Secondary | ICD-10-CM

## 2018-06-01 DIAGNOSIS — Z923 Personal history of irradiation: Secondary | ICD-10-CM | POA: Diagnosis not present

## 2018-06-01 DIAGNOSIS — C50411 Malignant neoplasm of upper-outer quadrant of right female breast: Secondary | ICD-10-CM

## 2018-06-01 DIAGNOSIS — Z9221 Personal history of antineoplastic chemotherapy: Secondary | ICD-10-CM | POA: Diagnosis not present

## 2018-06-01 DIAGNOSIS — C7951 Secondary malignant neoplasm of bone: Secondary | ICD-10-CM | POA: Insufficient documentation

## 2018-06-01 LAB — CBC WITH DIFFERENTIAL/PLATELET
BASOS ABS: 0.1 10*3/uL (ref 0.0–0.1)
BASOS PCT: 2 %
Eosinophils Absolute: 0.5 10*3/uL (ref 0.0–0.5)
Eosinophils Relative: 14 %
HEMATOCRIT: 37.8 % (ref 34.8–46.6)
HEMOGLOBIN: 12.4 g/dL (ref 11.6–15.9)
Lymphocytes Relative: 26 %
Lymphs Abs: 0.9 10*3/uL (ref 0.9–3.3)
MCH: 30.1 pg (ref 25.1–34.0)
MCHC: 32.7 g/dL (ref 31.5–36.0)
MCV: 92 fL (ref 79.5–101.0)
MONOS PCT: 9 %
Monocytes Absolute: 0.3 10*3/uL (ref 0.1–0.9)
NEUTROS ABS: 1.9 10*3/uL (ref 1.5–6.5)
NEUTROS PCT: 49 %
Platelets: 292 10*3/uL (ref 145–400)
RBC: 4.1 MIL/uL (ref 3.70–5.45)
RDW: 20.9 % — ABNORMAL HIGH (ref 11.2–14.5)
WBC: 3.7 10*3/uL — ABNORMAL LOW (ref 3.9–10.3)

## 2018-06-01 LAB — COMPREHENSIVE METABOLIC PANEL
ALK PHOS: 94 U/L (ref 40–150)
ALT: 8 U/L (ref 0–55)
ANION GAP: 15 — AB (ref 3–11)
AST: 13 U/L (ref 5–34)
Albumin: 3.7 g/dL (ref 3.5–5.0)
BUN: 18 mg/dL (ref 7–26)
CALCIUM: 10.6 mg/dL — AB (ref 8.4–10.4)
CO2: 23 mmol/L (ref 22–29)
Chloride: 102 mmol/L (ref 98–109)
Creatinine, Ser: 1.05 mg/dL (ref 0.60–1.10)
GFR calc non Af Amer: 53 mL/min — ABNORMAL LOW (ref >60)
Glucose, Bld: 95 mg/dL (ref 70–140)
Potassium: 3.3 mmol/L — ABNORMAL LOW (ref 3.5–5.1)
SODIUM: 140 mmol/L (ref 136–145)
Total Bilirubin: <0.2 mg/dL — ABNORMAL LOW (ref 0.2–1.2)
Total Protein: 7.4 g/dL (ref 6.4–8.3)

## 2018-06-01 MED ORDER — SODIUM CHLORIDE 0.9 % IV SOLN
Freq: Once | INTRAVENOUS | Status: AC
  Administered 2018-06-01: 09:00:00 via INTRAVENOUS

## 2018-06-01 MED ORDER — HEPARIN SOD (PORK) LOCK FLUSH 100 UNIT/ML IV SOLN
500.0000 [IU] | Freq: Once | INTRAVENOUS | Status: AC | PRN
  Administered 2018-06-01: 500 [IU]
  Filled 2018-06-01: qty 5

## 2018-06-01 MED ORDER — DIPHENHYDRAMINE HCL 25 MG PO CAPS
25.0000 mg | ORAL_CAPSULE | Freq: Once | ORAL | Status: AC
  Administered 2018-06-01: 25 mg via ORAL

## 2018-06-01 MED ORDER — SODIUM CHLORIDE 0.9% FLUSH
10.0000 mL | INTRAVENOUS | Status: DC | PRN
  Administered 2018-06-01: 10 mL
  Filled 2018-06-01: qty 10

## 2018-06-01 MED ORDER — TRASTUZUMAB CHEMO 150 MG IV SOLR
450.0000 mg | Freq: Once | INTRAVENOUS | Status: AC
  Administered 2018-06-01: 450 mg via INTRAVENOUS
  Filled 2018-06-01: qty 21.43

## 2018-06-01 MED ORDER — SODIUM CHLORIDE 0.9 % IV SOLN: INTRAVENOUS | Status: DC

## 2018-06-01 MED ORDER — ACETAMINOPHEN 325 MG PO TABS
ORAL_TABLET | ORAL | Status: AC
  Filled 2018-06-01: qty 2

## 2018-06-01 MED ORDER — DIPHENHYDRAMINE HCL 25 MG PO CAPS
ORAL_CAPSULE | ORAL | Status: AC
  Filled 2018-06-01: qty 1

## 2018-06-01 MED ORDER — SODIUM CHLORIDE 0.9 % IV SOLN: Freq: Once | INTRAVENOUS | Status: DC

## 2018-06-01 MED ORDER — ACETAMINOPHEN 325 MG PO TABS
650.0000 mg | ORAL_TABLET | Freq: Once | ORAL | Status: AC
  Administered 2018-06-01: 650 mg via ORAL

## 2018-06-01 MED ORDER — POTASSIUM CHLORIDE ER 10 MEQ PO TBCR: 20.0000 meq | EXTENDED_RELEASE_TABLET | Freq: Every day | ORAL | 0 refills | Status: DC

## 2018-06-01 NOTE — Patient Instructions (Addendum)
[facility] Discharge Instructions for Patients Receiving Chemotherapy  Today you received the following chemotherapy agents :  Herceptin.  To help prevent nausea and vomiting after your treatment, we encourage you to take your nausea medication as prescribed.   If you develop nausea and vomiting that is not controlled by your nausea medication, call the clinic.   BELOW ARE SYMPTOMS THAT SHOULD BE REPORTED IMMEDIATELY:  *FEVER GREATER THAN 100.5 F  *CHILLS WITH OR WITHOUT FEVER  NAUSEA AND VOMITING THAT IS NOT CONTROLLED WITH YOUR NAUSEA MEDICATION  *UNUSUAL SHORTNESS OF BREATH  *UNUSUAL BRUISING OR BLEEDING  TENDERNESS IN MOUTH AND THROAT WITH OR WITHOUT PRESENCE OF ULCERS  *URINARY PROBLEMS  *BOWEL PROBLEMS  UNUSUAL RASH Items with * indicate a potential emergency and should be followed up as soon as possible.  Feel free to call the clinic should you have any questions or concerns. The clinic phone number is [phone].  Please show the CHEMO ALERT CARD at check-in to the Emergency Department and triage nurse.   Hypokalemia Hypokalemia means that the amount of potassium in the blood is lower than normal.Potassium is a chemical that helps regulate the amount of fluid in the body (electrolyte). It also stimulates muscle tightening (contraction) and helps nerves work properly.Normally, most of the body's potassium is inside of cells, and only a very small amount is in the blood. Because the amount in the blood is so small, minor changes to potassium levels in the blood can be life-threatening. What are the causes? This condition may be caused by:  Antibiotic medicine.  Diarrhea or vomiting. Taking too much of a medicine that helps you have a bowel movement (laxative) can cause diarrhea and lead to hypokalemia.  Chronic kidney disease (CKD).  Medicines that help the body get rid of excess fluid (diuretics).  Eating disorders, such as bulimia.  Low  magnesium levels in the body.  Sweating a lot.  What are the signs or symptoms? Symptoms of this condition include:  Weakness.  Constipation.  Fatigue.  Muscle cramps.  Mental confusion.  Skipped heartbeats or irregular heartbeat (palpitations).  Tingling or numbness.  How is this diagnosed? This condition is diagnosed with a blood test. How is this treated? Hypokalemia can be treated by taking potassium supplements by mouth or adjusting the medicines that you take. Treatment may also include eating more foods that contain a lot of potassium. If your potassium level is very low, you may need to get potassium through an IV tube in one of your veins and be monitored in the hospital. Follow these instructions at home:  Take over-the-counter and prescription medicines only as told by your health care provider. This includes vitamins and supplements.  Eat a healthy diet. A healthy diet includes fresh fruits and vegetables, whole grains, healthy fats, and lean proteins.  If instructed, eat more foods that contain a lot of potassium, such as: ? Nuts, such as peanuts and pistachios. ? Seeds, such as sunflower seeds and pumpkin seeds. ? Peas, lentils, and lima beans. ? Whole grain and bran cereals and breads. ? Fresh fruits and vegetables, such as apricots, avocado, bananas, cantaloupe, kiwi, oranges, tomatoes, asparagus, and potatoes. ? Orange juice. ? Tomato juice. ? Red meats. ? Yogurt.  Keep all follow-up visits as told by your health care provider. This is important. Contact a health care provider if:  You have weakness that gets worse.  You feel your heart pounding or racing.  You vomit.  You have   diarrhea.  You have diabetes (diabetes mellitus) and you have trouble keeping your blood sugar (glucose) in your target range. Get help right away if:  You have chest pain.  You have shortness of breath.  You have vomiting or diarrhea that lasts for more than 2  days.  You faint. This information is not intended to replace advice given to you by your health care provider. Make sure you discuss any questions you have with your health care provider. Document Released: 12/15/2005 Document Revised: 08/02/2016 Document Reviewed: 08/02/2016 Elsevier Interactive Patient Education  2018 Elsevier Inc.  

## 2018-06-01 NOTE — Progress Notes (Signed)
Pt has dental appt Thursday, 06/03/18. Will postpone Zometa until after dental appointment.

## 2018-06-01 NOTE — Progress Notes (Signed)
nom

## 2018-06-02 LAB — CANCER ANTIGEN 27.29: CA 27.29: 24.8 U/mL (ref 0.0–38.6)

## 2018-06-03 ENCOUNTER — Encounter: Payer: Self-pay | Admitting: *Deleted

## 2018-06-03 ENCOUNTER — Encounter: Payer: Self-pay | Admitting: Radiation Oncology

## 2018-06-03 ENCOUNTER — Telehealth: Payer: Self-pay | Admitting: *Deleted

## 2018-06-03 NOTE — Progress Notes (Signed)
Ms. Skalla presents for follow up of radiation completed 05/11/18 to her Right Breast and 1 fraction to her T8 spine on 04/21/18. She reports fatigue. She also reports chronic burping and stomach gas at times. She has hyperpigmentation to her Right Breast. Her moist desquamation has healed and she completed the container of silvadene she was given. She continues to use radiaplex lotion to her Right Breast. She will switch to a vitamin E containing lotion when the radiaplex is completed. She reports itching to her Right Nipple which she is using hydrocortisone with some relief.  She continues trastuzumab/pertuzumab every 3 weeks. She saw Dr. Jana Hakim on 05/11/18 and started Anastrozole on 05/29/18. She will see Dr. Jana Hakim next on 08/03/18 and Wilber Bihari NP on 06/22/18 for infusion.    BP 124/69 (BP Location: Left Arm, Patient Position: Sitting, Cuff Size: Normal)   Pulse 76   Temp 98.4 F (36.9 C) (Oral)   Resp 20   Ht 5' 3.5" (1.613 m)   Wt 158 lb 9.6 oz (71.9 kg)   SpO2 98%   BMI 27.65 kg/m    Wt Readings from Last 3 Encounters:  06/11/18 158 lb 9.6 oz (71.9 kg)  06/04/18 160 lb 12.8 oz (72.9 kg)  05/11/18 161 lb 4.8 oz (73.2 kg)

## 2018-06-03 NOTE — Telephone Encounter (Signed)
Requested date of birth to identify this mutual patient caller "Requesting return call to discuss." "She was to bring a letter today from Dr. Jana Hakim to let us know what to do.  She did not bring a letter today."  Call transferred to collaborative to assist with this request.  No letter from assigned provider or recent letters in Epic letters noted at this time.

## 2018-06-04 ENCOUNTER — Other Ambulatory Visit: Payer: Self-pay

## 2018-06-04 ENCOUNTER — Ambulatory Visit (HOSPITAL_BASED_OUTPATIENT_CLINIC_OR_DEPARTMENT_OTHER)
Admission: RE | Admit: 2018-06-04 | Discharge: 2018-06-04 | Disposition: A | Discharge status: Home / Self Care | Payer: 59 | Source: Ambulatory Visit

## 2018-06-04 ENCOUNTER — Ambulatory Visit (HOSPITAL_COMMUNITY)
Admission: RE | Admit: 2018-06-04 | Discharge: 2018-06-04 | Disposition: A | Discharge status: Home / Self Care | Payer: 59 | Source: Ambulatory Visit | Attending: Cardiology | Admitting: Cardiology

## 2018-06-04 ENCOUNTER — Other Ambulatory Visit (HOSPITAL_COMMUNITY): Payer: 59

## 2018-06-04 VITALS — BP 138/82 | HR 79 | Wt 160.8 lb

## 2018-06-04 DIAGNOSIS — J45909 Unspecified asthma, uncomplicated: Secondary | ICD-10-CM | POA: Diagnosis not present

## 2018-06-04 DIAGNOSIS — C50411 Malignant neoplasm of upper-outer quadrant of right female breast: Secondary | ICD-10-CM

## 2018-06-04 DIAGNOSIS — E039 Hypothyroidism, unspecified: Secondary | ICD-10-CM | POA: Insufficient documentation

## 2018-06-04 DIAGNOSIS — I10 Essential (primary) hypertension: Secondary | ICD-10-CM | POA: Diagnosis not present

## 2018-06-04 DIAGNOSIS — Z79899 Other long term (current) drug therapy: Secondary | ICD-10-CM | POA: Diagnosis not present

## 2018-06-04 DIAGNOSIS — E785 Hyperlipidemia, unspecified: Secondary | ICD-10-CM | POA: Insufficient documentation

## 2018-06-04 DIAGNOSIS — Z170 Estrogen receptor positive status [ER+]: Secondary | ICD-10-CM | POA: Diagnosis not present

## 2018-06-04 DIAGNOSIS — F17210 Nicotine dependence, cigarettes, uncomplicated: Secondary | ICD-10-CM | POA: Insufficient documentation

## 2018-06-04 DIAGNOSIS — Z79890 Hormone replacement therapy (postmenopausal): Secondary | ICD-10-CM | POA: Diagnosis not present

## 2018-06-04 MED ORDER — METOPROLOL SUCCINATE ER 50 MG PO TB24: 50.0000 mg | ORAL_TABLET | Freq: Every day | ORAL | 3 refills | Status: DC

## 2018-06-04 MED ORDER — ALIROCUMAB 75 MG/ML ~~LOC~~ SOPN: 75.0000 mg | PEN_INJECTOR | SUBCUTANEOUS | 0 refills | Status: DC

## 2018-06-04 NOTE — Progress Notes (Signed)
  Echocardiogram 2D Echocardiogram has been performed.  Matilde Bash 06/04/2018, 10:53 AM

## 2018-06-04 NOTE — Patient Instructions (Addendum)
Sent Pr[facility] to [facility] per Dr. Aundra Dubin    Your physician has requested that you have an echocardiogram. Echocardiography is a painless test that uses sound waves to create images of your heart. It provides your doctor with information about the size and shape of your heart and how well your heart's chambers and valves are working. This procedure takes approximately one hour. There are no restrictions for this procedure.  Your physician recommends that you schedule a follow-up appointment in: 3 months with Dr. Aundra Dubin  an a echocardiogram

## 2018-06-06 NOTE — Progress Notes (Signed)
Oncology: Dr. Jana [name]  67 y.o. with history of HTN, asthma, and hyperlidemia was referred by Dr. Jana [name] for cardio-oncology evaluation.  She was diagnosed with breast cancer on the right in 9/18. ER+/PR-.  Lymph node was HER2+, breast biopsy was HER2-.  She completed neoadjuvant chemotherapy with carboplatin, docetaxel, trastuzumab, pertuzumab x 6 cycles and now is on trastuzumab alone to complete a year.     She returns for cardio-oncology followup.  BP is controlled, no lightheadedness.  She is still smoking.  She has no dyspnea walking on flat ground.  At times she coughs and wheezes, she attributes this to asthma.  No chest pain or exertional dyspnea.  No orthopnea/PND.   Labs (3/19): LDL 60 Labs (6/19): K 3.3, creatinine 1.05  PMH: 1. Asthma: mild. 2. HTN 3. Hyperlipidemia 4. Hypothyroidism 5. Diverticulitis with h/o colectomy.  6. Breast cancer: On right, diagnosed 9/18.  ER+/PR-.  Lymph node was HER2+, breast biopsy was HER2-.  She is getting neoadjuvant chemotherapy with carboplatin, docetaxel, trastuzumab, pertuzumab x 6 cycles then trastuzumab to complete a year. She will have surgery and radiation after chemo.  - Echo (9/18): EF 50-55%, restrictive diastolic function, mild AI, mild-moderate MR.  - Echo (12/18): EF 55-60% with mild focal basal septal hypertrophy, GLS -20.5%, normal RV size and systolic function, mild AI, moderate MR.  - Echo (3/19): EF 60-65%, mild LVH, moderate diastolic dysfunction, strain inaccurate, mild AI, moderate MR, normla RV size and systolic function.  - Echo (6/19): EF 60-65%, mild LVH, GLS -16.4%, moderate diastolic dysfunction, normal RV size and systolic function, moderate MR, mild AI.   Social History   Socioeconomic History  . Marital status: Divorced    Spouse name: Not on file  . Number of children: Not on file  . Years of education: Not on file  . Highest education level: Not on file  Occupational History  . Not on file  Social Needs   . Financial resource strain: Not on file  . Food insecurity:    Worry: Not on file    Inability: Not on file  . Transportation needs:    Medical: Not on file    Non-medical: Not on file  Tobacco Use  . Smoking status: Current Some Day Smoker    Packs/day: 0.25    Years: 40.00    Pack years: 10.00    Types: Cigarettes  . Smokeless tobacco: Never Used  . Tobacco comment: she is smoking 2 or more daily, she plans to use nicotine patch.   Substance and Sexual Activity  . Alcohol use: Yes    Comment: occasional  . Drug use: No  . Sexual activity: Not on file  Lifestyle  . Physical activity:    Days per week: Not on file    Minutes per session: Not on file  . Stress: Not on file  Relationships  . Social connections:    Talks on phone: Not on file    Gets together: Not on file    Attends religious service: Not on file    Active member of club or organization: Not on file    Attends meetings of clubs or organizations: Not on file    Relationship status: Not on file  . Intimate partner violence:    Fear of current or ex partner: Not on file    Emotionally abused: Not on file    Physically abused: Not on file    Forced sexual activity: Not on file  Other Topics Concern  .  Not on file  Social History Narrative  . Not on file   Family History  Problem Relation Age of Onset  . Cancer Mother        rectal  . Hypertension Brother   . Hypertension Brother   . Asthma Son   . Asthma Brother    ROS: All systems reviewed and negative except as per HPI.   Current Outpatient Medications  Medication Sig Dispense Refill  . albuterol (VENTOLIN HFA) 108 (90 BASE) MCG/ACT inhaler Inhale 1-2 puffs into the lungs every 6 (six) hours as needed for wheezing or shortness of breath. (Patient taking differently: Inhale 2 puffs into the lungs every 6 (six) hours as needed for wheezing or shortness of breath. ) 1 Inhaler 6  . Alirocumab (PRALUENT) 75 MG/ML SOPN Inject 75 mg into the muscle  every 14 (fourteen) days. 6 pen 0  . amLODipine (NORVASC) 10 MG tablet Take 10 mg by mouth daily.     Marland Kitchen amoxicillin-clavulanate (AUGMENTIN) 875-125 MG tablet Take 1 tablet by mouth 2 (two) times daily. 20 tablet 0  . anastrozole (ARIMIDEX) 1 MG tablet Take 1 tablet (1 mg total) by mouth daily. 90 tablet 4  . cetirizine (ZYRTEC) 10 MG tablet Take 10 mg by mouth daily as needed for allergies.   11  . ibuprofen (ADVIL,MOTRIN) 200 MG tablet Take 400 mg by mouth every 6 (six) hours as needed for headache or moderate pain.    Marland Kitchen levothyroxine (SYNTHROID, LEVOTHROID) 50 MCG tablet Take 50 mcg by mouth daily.    Marland Kitchen lidocaine-prilocaine (EMLA) cream Apply 1 application topically as needed (port access).    Marland Kitchen losartan-hydrochlorothiazide (HYZAAR) 100-12.5 MG tablet Take 1 tablet by mouth daily.   3  . metoprolol succinate (TOPROL-XL) 50 MG 24 hr tablet Take 1 tablet (50 mg total) by mouth daily. 90 tablet 3  . mometasone-formoterol (DULERA) 100-5 MCG/ACT AERO Inhale 2 puffs into the lungs 2 (two) times daily. 3 Inhaler 0  . Naphazoline-Glycerin (REDNESS RELIEF OP) Place 1 drop into both eyes daily as needed (redness).    . nitroGLYCERIN (NITROSTAT) 0.4 MG SL tablet Place 0.4 mg under the tongue every 5 (five) minutes as needed for chest pain (she has not had to take this medicine in a long time. she has it at home).     . potassium chloride (K-DUR) 10 MEQ tablet Take 2 tablets (20 mEq total) by mouth daily. 60 tablet 0  . traMADol (ULTRAM) 50 MG tablet Take 1 tablet (50 mg total) by mouth every 8 (eight) hours as needed (pain). 45 tablet 0   No current facility-administered medications for this encounter.    BP 138/82   Pulse 79   Wt 160 lb 12.8 oz (72.9 kg)   SpO2 98%   BMI 28.04 kg/m   General: NAD Neck: No JVD, no thyromegaly or thyroid nodule.  Lungs: Clear to auscultation bilaterally with normal respiratory effort. CV: Nondisplaced PMI.  Heart regular S1/S2, no S3/S4, 1/6 SEM RUSB.  No peripheral  edema.  No carotid bruit.  Normal pedal pulses.  Abdomen: Soft, nontender, no hepatosplenomegaly, no distention.  Skin: Intact without lesions or rashes.  Neurologic: Alert and oriented x 3.  Psych: Normal affect. Extremities: No clubbing or cyanosis.  HEENT: Normal.   Assessment/Plan: 1. Smoking: I encouraged her again to quit.  She is going to try nicotine patches.  2. HTN: BP controlled on current regimen. 3. Hyperlipidemia: Good lipids in [place].  4. Breast cancer: She will  be on a Herceptin-based regimen for a year.  I reviewed today's echo, EF is stable, strain images have been difficult (today and in the past).   - Repeat echo in 3 months.   Loralie Champagne 06/06/2018

## 2018-06-11 ENCOUNTER — Other Ambulatory Visit: Payer: Self-pay

## 2018-06-11 ENCOUNTER — Encounter: Payer: Self-pay | Admitting: Radiation Oncology

## 2018-06-11 ENCOUNTER — Ambulatory Visit
Admission: RE | Admit: 2018-06-11 | Discharge: 2018-06-11 | Disposition: A | Discharge status: Home / Self Care | Payer: 59 | Source: Ambulatory Visit | Attending: Radiation Oncology | Admitting: Radiation Oncology

## 2018-06-11 ENCOUNTER — Ambulatory Visit: Payer: Self-pay | Admitting: Radiation Oncology

## 2018-06-11 VITALS — BP 124/69 | HR 76 | Temp 98.4°F | Resp 20 | Ht 63.5 in | Wt 158.6 lb

## 2018-06-11 DIAGNOSIS — Z79899 Other long term (current) drug therapy: Secondary | ICD-10-CM | POA: Diagnosis not present

## 2018-06-11 DIAGNOSIS — R590 Localized enlarged lymph nodes: Secondary | ICD-10-CM | POA: Diagnosis not present

## 2018-06-11 DIAGNOSIS — Z886 Allergy status to analgesic agent status: Secondary | ICD-10-CM | POA: Diagnosis not present

## 2018-06-11 DIAGNOSIS — C7951 Secondary malignant neoplasm of bone: Secondary | ICD-10-CM

## 2018-06-11 DIAGNOSIS — Z853 Personal history of malignant neoplasm of breast: Secondary | ICD-10-CM | POA: Insufficient documentation

## 2018-06-11 DIAGNOSIS — Z85830 Personal history of malignant neoplasm of bone: Secondary | ICD-10-CM | POA: Insufficient documentation

## 2018-06-11 DIAGNOSIS — Z08 Encounter for follow-up examination after completed treatment for malignant neoplasm: Secondary | ICD-10-CM | POA: Diagnosis not present

## 2018-06-11 DIAGNOSIS — C50411 Malignant neoplasm of upper-outer quadrant of right female breast: Secondary | ICD-10-CM | POA: Diagnosis present

## 2018-06-11 DIAGNOSIS — Z170 Estrogen receptor positive status [ER+]: Secondary | ICD-10-CM

## 2018-06-11 HISTORY — DX: Personal history of irradiation: Z92.3

## 2018-06-11 NOTE — Progress Notes (Signed)
Radiation Oncology         [phone] ________________________________  Name: Tracie [name] MRN: [id number]  Date: 06/11/2018  DOB: March 03, 1951  Follow-Up Visit Note  Outpatient  CC: Bartholome Bill, MD  [name], MD  Diagnosis:   Malignant neoplasm of upper-outer quadrant of right breastwith bony metastasesin female,Stage IV(cT2, cN1, cM1, G3, ER: Pos, PR: Neg, HER2: Pos) Cancer Staging Malignant neoplasm of upper-outer quadrant of right breast in female, estrogen receptor positive ([place]) Staging form: Breast, AJCC 8th Edition - Clinical stage from 09/09/2017: Stage IV (cT2, cN1, cM1, G3, ER: Positive, PR: Negative, HER2: Positive) - Signed by [name], MD on 03/16/2018 Staging comments: Staged at breast  Conference 9.12.18 - Pathologic: No Stage Recommended (ypT44m, pN0, pM1, ER: Positive, PR: Negative, HER2: Positive) - Unsigned    ICD-10-CM   1. Bone metastasis ([place] C79.51   2. Malignant neoplasm of upper-outer quadrant of right breast in female, estrogen receptor positive ([place] C50.411    Z17.0     Previous Radiation:  18 Gy directed to the T8 spine in 1 fraction completed on 04/21/18.   50 Gy directed to the Right breast and regional nodes delivered in 25 fractions completed on 05/11/18.  Narrative:  The patient returns today for routine follow-up.  She is doing well. She denies any new palpable lumps or bumps in her breasts. She continues to be followed by medical oncology and is taking trastuzumab/pertuzumab every three weeks.  She reports fatigue. She also reports chronic burping and stomach gas at times. She has hyperpigmentation to her Right Breast. Her moist desquamation has healed and she completed the container of silvadene she was given. She continues to use radiaplex lotion to her Right Breast. She will switch to a vitamin E containing lotion when the radiaplex is completed. She reports itching to her Right Nipple which she is using hydrocortisone  with some relief. She expressed periodic back pain near scapula region on right, this has been ongoing for a while and attributed to arthritis.                         ALLERGIES:  is allergic to fish allergy; other; ace inhibitors; and tylenol [acetaminophen].  Meds: Current Outpatient Medications  Medication Sig Dispense Refill  . albuterol (VENTOLIN HFA) 108 (90 BASE) MCG/ACT inhaler Inhale 1-2 puffs into the lungs every 6 (six) hours as needed for wheezing or shortness of breath. (Patient taking differently: Inhale 2 puffs into the lungs every 6 (six) hours as needed for wheezing or shortness of breath. ) 1 Inhaler 6  . Alirocumab (PRALUENT) 75 MG/ML SOPN Inject 75 mg into the muscle every 14 (fourteen) days. 6 pen 0  . amLODipine (NORVASC) 10 MG tablet Take 10 mg by mouth daily.     .Marland Kitchenanastrozole (ARIMIDEX) 1 MG tablet Take 1 tablet (1 mg total) by mouth daily. 90 tablet 4  . cetirizine (ZYRTEC) 10 MG tablet Take 10 mg by mouth daily as needed for allergies.   11  . ibuprofen (ADVIL,MOTRIN) 200 MG tablet Take 400 mg by mouth every 6 (six) hours as needed for headache or moderate pain.    .Marland Kitchenlevothyroxine (SYNTHROID, LEVOTHROID) 50 MCG tablet Take 50 mcg by mouth daily.    .Marland Kitchenlidocaine-prilocaine (EMLA) cream Apply 1 application topically as needed (port access).    .Marland Kitchenlosartan-hydrochlorothiazide (HYZAAR) 100-12.5 MG tablet Take 1 tablet by mouth daily.   3  . metoprolol succinate (TOPROL-XL)  50 MG 24 hr tablet Take 1 tablet (50 mg total) by mouth daily. 90 tablet 3  . mometasone-formoterol (DULERA) 100-5 MCG/ACT AERO Inhale 2 puffs into the lungs 2 (two) times daily. 3 Inhaler 0  . Naphazoline-Glycerin (REDNESS RELIEF OP) Place 1 drop into both eyes daily as needed (redness).    . nitroGLYCERIN (NITROSTAT) 0.4 MG SL tablet Place 0.4 mg under the tongue every 5 (five) minutes as needed for chest pain (she has not had to take this medicine in a long time. she has it at home).     . potassium  chloride (K-DUR) 10 MEQ tablet Take 2 tablets (20 mEq total) by mouth daily. 60 tablet 0  . traMADol (ULTRAM) 50 MG tablet Take 1 tablet (50 mg total) by mouth every 8 (eight) hours as needed (pain). 45 tablet 0   No current facility-administered medications for this encounter.     Physical Findings: The patient is in no acute distress. Patient is alert and oriented.  height is 5' 3.5" (1.613 m) and weight is 158 lb 9.6 oz (71.9 kg). Her oral temperature is 98.4 F (36.9 C). Her blood pressure is 124/69 and her pulse is 76. Her respiration is 20 and oxygen saturation is 98%. .   Some residual hypopigmentation in the lower right breast and some hyperpigmentation throughout the breast. But skin is smooth and intact. She has a patch of hyperpigmentations where the posterior axillary field was. There is hypopigmentation in the supraclavicular field.   Lab Findings: Lab Results  Component Value Date   WBC 3.7 (L) 06/01/2018   HGB 12.4 06/01/2018   HCT 37.8 06/01/2018   MCV 92.0 06/01/2018   PLT 292 06/01/2018    @LASTCHEMISTRY @  Radiographic Findings: No results found.  Impression/Plan: This is a very pleasant woman with a history of Malignant neoplasm of upper-outer quadrant of right breastwith bony metastasisin female,Stage IV(cT2, cN1, cM1, G3, ER: Pos, PR: Neg, HER2: Pos).  S/p right breast and nodal RT, and T8 SRS. Healing well  She knows continue with surveillance scans per med/onc.  I don't think an MRI of the spine is needed unless symptoms warrant, given the lack of clear tumor definition on the last MRI s/p RFA. I encourage her to use vitamin lotion/oil around the breast and to use over the counter pain medication as needed for breast soreness. I offered 6 mo f/u vs PRN followup.  She prefers PRN.  I will see her back as needed, she will continue to see Dr. Jana Hakim.  I encouraged her to call if she has any issues in the interim.    _____________________________________   [name], MD  This document serves as a record of services personally performed by [name] MD. It was created on her behalf by Delton Coombes, a trained medical scribe. The creation of this record is based on the scribe's personal observations and the provider's statements to them.

## 2018-06-14 NOTE — Progress Notes (Signed)
Name: Tracie [name]     MRN: [id number]        Date: 04/28/2018                        DOB: 1951/06/26  Radiation Oncology End of Treatment Note  Diagnosis:  Malignant neoplasm of upper-outer quadrant of right breastwith bony metastasesin female,Stage IV(cT2, cN1, cM1, G3, ER: Pos, PR: Neg, HER2: Pos) Cancer Staging Malignant neoplasm of upper-outer quadrant of right breast in female, estrogen receptor positive ([name]) Staging form: Breast, AJCC 8th Edition - Clinical stage from 09/09/2017: Stage IV (cT2, cN1, cM1, G3, ER: Positive, PR: Negative, HER2: Positive) - Signed by [name], MD on 03/16/2018   Indication for treatment:  Aggressive local control      Radiation treatment dates:   04/21/2018  Site/dose:   T8 spine, 18 Gy in 1 fraction    Beams/energy:   Photon, SBRT/SRT-VMAT, 10FFF photons.   3 VMAT beams Max dose=119.4%  Narrative: The patient tolerated radiation treatment relatively well.   Plan: The patient has completed SBRT spinal radiation treatment. Continue breast radiotherapy.   -----------------------------------  [name], MD

## 2018-06-14 NOTE — Progress Notes (Signed)
[facility] Radiation Oncology End of Treatment Note  Name:Tracie [name]  Date: 04/28/2018 VOH:[id number] DOB:Oct 10, 1951    DIAGNOSIS:  Malignant neoplasm of upper-outer quadrant of right breast with bony metastases in female, Stage IV (cT2, cN1, cM1, G3, ER: Pos, PR: Neg, HER2: Pos) Cancer Staging Malignant neoplasm of upper-outer quadrant of right breast in female, estrogen receptor positive ([place]) Staging form: Breast, AJCC 8th Edition - Clinical stage from 09/09/2017: Stage IV (cT2, cN1, cM1, G3, ER: Positive, PR: Negative, HER2: Positive) - Signed by [name], MD on 03/16/2018   INDICATION FOR TREATMENT: Curative in setting of oligometastasic disease    TREATMENT DATES: 04-07-18 to 05-11-18                          SITE/DOSE:  Right Breast and Axilla, SCV nodes / 50 Gy in 25 fx                    BEAMS/ENERGY:  3D / 6 and 10 MV              NARRATIVE:   She tolerated radiotherapy relatively well.                          PLAN: Routine followup in one month. Patient instructed to call if questions or worsening complaints in interim. -----------------------------------  [name], MD

## 2018-06-16 ENCOUNTER — Telehealth: Payer: Self-pay | Admitting: *Deleted

## 2018-06-16 NOTE — Telephone Encounter (Signed)
Message left by pt stating she was seen by her dentist " and he will not be able to pull my teeth until June the 24 and I think Dr Jana Hakim wanted to start the reclast at my next visit "  This RN attempted to return call to pt- informed her MD would be made aware of above. Noted her appointment is scheduled for 6/25 and informed pt if needed post extractions to call if she feels she cannot make these appointments.  Tracie [name] stated understanding.  This note will be given to MD.

## 2018-06-18 ENCOUNTER — Other Ambulatory Visit: Payer: Self-pay | Admitting: Oncology

## 2018-06-18 NOTE — Progress Notes (Signed)
Tracie [name] is having some dental extractions.  We are canceling the zolendronate for now.

## 2018-06-22 ENCOUNTER — Ambulatory Visit: Payer: 59

## 2018-06-22 ENCOUNTER — Inpatient Hospital Stay: Payer: 59

## 2018-06-22 ENCOUNTER — Inpatient Hospital Stay (HOSPITAL_BASED_OUTPATIENT_CLINIC_OR_DEPARTMENT_OTHER): Payer: 59 | Admitting: Adult Health

## 2018-06-22 ENCOUNTER — Other Ambulatory Visit: Payer: Self-pay | Admitting: Oncology

## 2018-06-22 ENCOUNTER — Telehealth: Payer: Self-pay

## 2018-06-22 ENCOUNTER — Encounter: Payer: Self-pay | Admitting: Adult Health

## 2018-06-22 ENCOUNTER — Other Ambulatory Visit: Payer: Self-pay | Admitting: Adult Health

## 2018-06-22 ENCOUNTER — Telehealth: Payer: Self-pay | Admitting: Oncology

## 2018-06-22 VITALS — BP 124/63 | HR 71 | Temp 98.4°F | Resp 18 | Ht 63.5 in | Wt 160.2 lb

## 2018-06-22 DIAGNOSIS — Z170 Estrogen receptor positive status [ER+]: Principal | ICD-10-CM

## 2018-06-22 DIAGNOSIS — C7951 Secondary malignant neoplasm of bone: Secondary | ICD-10-CM | POA: Diagnosis not present

## 2018-06-22 DIAGNOSIS — C50411 Malignant neoplasm of upper-outer quadrant of right female breast: Secondary | ICD-10-CM

## 2018-06-22 DIAGNOSIS — Z95828 Presence of other vascular implants and grafts: Secondary | ICD-10-CM

## 2018-06-22 DIAGNOSIS — C773 Secondary and unspecified malignant neoplasm of axilla and upper limb lymph nodes: Secondary | ICD-10-CM

## 2018-06-22 DIAGNOSIS — Z5112 Encounter for antineoplastic immunotherapy: Secondary | ICD-10-CM | POA: Diagnosis not present

## 2018-06-22 DIAGNOSIS — Z79811 Long term (current) use of aromatase inhibitors: Secondary | ICD-10-CM | POA: Diagnosis not present

## 2018-06-22 LAB — CBC WITH DIFFERENTIAL/PLATELET
BASOS ABS: 0 10*3/uL (ref 0.0–0.1)
Basophils Relative: 1 %
Eosinophils Absolute: 0.2 10*3/uL (ref 0.0–0.5)
Eosinophils Relative: 6 %
HEMATOCRIT: 35 % (ref 34.8–46.6)
HEMOGLOBIN: 11.7 g/dL (ref 11.6–15.9)
LYMPHS PCT: 27 %
Lymphs Abs: 0.9 10*3/uL (ref 0.9–3.3)
MCH: 30 pg (ref 25.1–34.0)
MCHC: 33.3 g/dL (ref 31.5–36.0)
MCV: 90.1 fL (ref 79.5–101.0)
MONO ABS: 0.5 10*3/uL (ref 0.1–0.9)
Monocytes Relative: 16 %
NEUTROS ABS: 1.8 10*3/uL (ref 1.5–6.5)
Neutrophils Relative %: 50 %
Platelets: 286 10*3/uL (ref 145–400)
RBC: 3.89 MIL/uL (ref 3.70–5.45)
RDW: 20 % — AB (ref 11.2–14.5)
WBC: 3.5 10*3/uL — ABNORMAL LOW (ref 3.9–10.3)

## 2018-06-22 LAB — COMPREHENSIVE METABOLIC PANEL
ALK PHOS: 83 U/L (ref 38–126)
ALT: 7 U/L (ref 0–44)
AST: 13 U/L — AB (ref 15–41)
Albumin: 3.7 g/dL (ref 3.5–5.0)
Anion gap: 9 (ref 5–15)
BUN: 11 mg/dL (ref 8–23)
CALCIUM: 10.2 mg/dL (ref 8.9–10.3)
CO2: 28 mmol/L (ref 22–32)
CREATININE: 0.9 mg/dL (ref 0.44–1.00)
Chloride: 102 mmol/L (ref 98–111)
GFR calc Af Amer: >60 mL/min (ref >60)
GFR calc non Af Amer: >60 mL/min (ref >60)
Glucose, Bld: 88 mg/dL (ref 70–99)
POTASSIUM: 3.9 mmol/L (ref 3.5–5.1)
Sodium: 139 mmol/L (ref 135–145)
TOTAL PROTEIN: 7.2 g/dL (ref 6.5–8.1)

## 2018-06-22 MED ORDER — SODIUM CHLORIDE 0.9 % IV SOLN
Freq: Once | INTRAVENOUS | Status: AC
  Administered 2018-06-22: 10:00:00 via INTRAVENOUS

## 2018-06-22 MED ORDER — DIPHENHYDRAMINE HCL 25 MG PO CAPS
25.0000 mg | ORAL_CAPSULE | Freq: Once | ORAL | Status: AC
  Administered 2018-06-22: 25 mg via ORAL

## 2018-06-22 MED ORDER — HEPARIN SOD (PORK) LOCK FLUSH 100 UNIT/ML IV SOLN
500.0000 [IU] | Freq: Once | INTRAVENOUS | Status: AC | PRN
  Administered 2018-06-22: 500 [IU]
  Filled 2018-06-22: qty 5

## 2018-06-22 MED ORDER — ACETAMINOPHEN 325 MG PO TABS
650.0000 mg | ORAL_TABLET | Freq: Once | ORAL | Status: AC
  Administered 2018-06-22: 650 mg via ORAL

## 2018-06-22 MED ORDER — TRASTUZUMAB CHEMO 150 MG IV SOLR
450.0000 mg | Freq: Once | INTRAVENOUS | Status: AC
  Administered 2018-06-22: 450 mg via INTRAVENOUS
  Filled 2018-06-22: qty 21.43

## 2018-06-22 MED ORDER — ACETAMINOPHEN 325 MG PO TABS
ORAL_TABLET | ORAL | Status: AC
Start: 2018-06-22 — End: ?
  Filled 2018-06-22: qty 2

## 2018-06-22 MED ORDER — LIDOCAINE-PRILOCAINE 2.5-2.5 % EX CREA: 1.0000 "application " | TOPICAL_CREAM | CUTANEOUS | 0 refills | Status: DC | PRN

## 2018-06-22 MED ORDER — SODIUM CHLORIDE 0.9% FLUSH
10.0000 mL | INTRAVENOUS | Status: DC | PRN
  Administered 2018-06-22: 10 mL
  Filled 2018-06-22: qty 10

## 2018-06-22 MED ORDER — DIPHENHYDRAMINE HCL 25 MG PO CAPS
ORAL_CAPSULE | ORAL | Status: AC
  Filled 2018-06-22: qty 1

## 2018-06-22 NOTE — Telephone Encounter (Signed)
No 6/25 los/orders/referrals.

## 2018-06-22 NOTE — Progress Notes (Signed)
[name]  [phone] [phone]     ID: Tracie [name] DOB: 1951-03-09  MR#: [id number]  YYT#:[id number]  Patient Care Team: Bartholome Bill, MD as PCP - General (Family Medicine) [name], MD as Consulting Physician (General Surgery) Magrinat, [name], MD as Consulting Physician (Oncology) [name], MD as Attending Physician (Radiation Oncology) Mottinger, [place] DDS (Physical Therapy) OTHER MD:  CHIEF COMPLAINT: Triple positive breast cancer  CURRENT TREATMENT: trastuzumab; adjuvant radiation pending   HISTORY OF CURRENT ILLNESS: From the original intake note:  Tracie [name] noted a change in her right breast sometime in July 2018. [facility] to report that she had found a "kernel" in her breast. She was advised to see her primary physician which she did. The patient was then set up for bilateral diagnostic mammography with tomography and right breast ultrasonography at [facility] 09/03/2017. The breast density was category I a. In the upper outer quadrant of the right breast there was a 2.1 cm high density mass, which was palpable. Also in the right breast more posteriorly there was a 1.5 cm high density mass which was felt to be a suspicious lymph node. Ultrasound of the right breast confirmed a 2.1 centimeter lobulated solid mass in the right breast upper outer quadrant 15.8 cm from the nipple in the 10:00 radiant. There was a 1.5 cm oval mass in the right axillary tail with a small rounded masses also suspicious for lymph node involvement. A total of 3 suspicious lymph nodes were identified.  On 09/03/2017 the patient underwent biopsy of the breast mass and the suspicious right axillary lymph node. The final pathology ([id number]) found invasive ductal carcinoma, grade 3, in both. Both tumors were estrogen receptor positive at 70-75%, and both were progesterone receptor negative. Both had an elevated proliferation  marker at 90%. The mass in the breast was HER-2 negative, with a signals ratio of 1.25 and the number per cell 1.88. The lymph node mass however was HER-2 positive with a signals ratio of 2.57, and the number per cell 3.60.  The patient's subsequent history is as detailed below.  INTERVAL HISTORY: Tracie [name] returns today for follow-up and treatment of her triple positive breast cancer. She receives trastuzumab every 21 days, with a dose due today.  She continues on Anastrozole daily with good tolerance.    Tracie [name] is doing moderately well.  She continues to have some pain in her right shoulder that Dr. Jana Hakim has attributed to arthritis.  She also notes continued pain in the nipple since undergoing radiation therapy.       REVIEW OF SYSTEMS: Tracie [name] underwent two teeth extractions yesterday.  She has some mild pain in the area she is taking tylenol #3 daily.    Tracie [name] denies any new side effects since starting Anastrozole such as joint aches, vaginal dryness, or hot flashes.    Tracie [name] continues to experience some fatigue.  She denies any other issues today such as fevers, chills, chest pain, shortness of breath, palpitations, leg swelling, headaches, new pain, bowel concerns such as constipation or diarrhea, or any other concerns.       PAST MEDICAL HISTORY: Past Medical History:  Diagnosis Date  . Angina pectoris ([place])    history of   . Arthritis   . Asthma   . Cancer ([place])    right breast  . Constipation   . Diverticulitis   . History of radiation therapy 04/07/18- 05/11/18   50 Gy in  25 fractions to right breast and regional nodes with four fields (no boost)  . History of radiation therapy 04/21/2018   T8 spine, 18 Gy in 1 fraction for a total dose of 18 Gy.   Marland Kitchen HPV in female   . Hyperlipidemia   . Hypertension   . Hypothyroidism   . Wears dentures   . Wears glasses     PAST SURGICAL HISTORY: Past Surgical History:  Procedure Laterality Date  . BREAST LUMPECTOMY WITH  RADIOACTIVE SEED AND SENTINEL LYMPH NODE BIOPSY Right 03/08/2018   Procedure: RIGHT BREAST RADIOACTIVE SEED GUIDED LUMPECTOMY WITH RIGHT AXILLARY RADIOACTIVE SEED TARGETED LYMPH NODE EXCISION AND SENTINEL LYMPH NODE BIOPSY, INJECT BLUE DYE RIGHT BREAST;  Surgeon: [name], MD;  Location: [facility];  Service: General;  Laterality: Right;  . CESAREAN SECTION    . COLON SURGERY  2012   sigmoidectomy  . COLOSTOMY    . COLOSTOMY TAKEDOWN  10/06/11  . IR BONE TUMOR(S)RF ABLATION  11/02/2017  . IR KYPHO THORACIC WITH BONE BIOPSY  11/02/2017  . IR RADIOLOGIST EVAL & MGMT  10/07/2017  . IR RADIOLOGIST EVAL & MGMT  11/25/2017  . PORTACATH PLACEMENT N/A 09/25/2017   Procedure: INSERTION PORT-A-CATH WITH ULTRA SOUND ERAS PATHWAY;  Surgeon: [name], MD;  Location: [facility];  Service: General;  Laterality: N/A;  . TONSILLECTOMY    . TUBAL LIGATION      FAMILY HISTORY Family History  Problem Relation Age of Onset  . Cancer Mother        rectal  . Hypertension Brother   . Hypertension Brother   . Asthma Son   . Asthma Brother    The patient has little information regarding her father. Her mother died at age 63 from a strange cancer--the patient does not know what it was, it may well have been cervical cancer from her description. The patient has one brother, no sisters. There is no history of breast or ovarian cancer in the family to the patient's knowledge   GYNECOLOGIC HISTORY:  No LMP recorded. Patient is postmenopausal.  menarche age 33, first live birth age 18 the patient is Tracie [name]. She stopped having periods at age 83. She never used oral contraceptives or hormone replacement.   SOCIAL HISTORY:  Works as a Scientist, product/process development. She is divorced. Currently her son Tracie [name] lives with her. He is a Art gallery manager. The 2 other children are Tracie [name], who lives in [place] and works in the theater and media, and Tracie [name] lives in [place], and is vice president of a  [employer]. The patient has 5 grandchildren. She is a Psychologist, forensic.    ADVANCED DIRECTIVES: Not in place    HEALTH MAINTENANCE: Social History   Tobacco Use  . Smoking status: Current Some Day Smoker    Packs/day: 0.25    Years: 40.00    Pack years: 10.00    Types: Cigarettes  . Smokeless tobacco: Never Used  . Tobacco comment: she is smoking 2 or more daily, she plans to use nicotine patch.   Substance Use Topics  . Alcohol use: Yes    Comment: occasional  . Drug use: No     Colonoscopy: September 2012   PAP:  Bone density:09/03/2017    Allergies  Allergen Reactions  . Fish Allergy Anaphylaxis and Swelling    Swelling of hands and feet., non shellfish allergy  . Other Anaphylaxis    Tree nuts   . Ace Inhibitors  Other (See Comments) and Cough    Cold like symptoms   . Tylenol [Acetaminophen] Other (See Comments)    Headache     Current Outpatient Medications  Medication Sig Dispense Refill  . acetaminophen-codeine (TYLENOL #3) 300-30 MG tablet     . albuterol (VENTOLIN HFA) 108 (90 BASE) MCG/ACT inhaler Inhale 1-2 puffs into the lungs every 6 (six) hours as needed for wheezing or shortness of breath. (Patient taking differently: Inhale 2 puffs into the lungs every 6 (six) hours as needed for wheezing or shortness of breath. ) 1 Inhaler 6  . Alirocumab (PRALUENT) 75 MG/ML SOPN Inject 75 mg into the muscle every 14 (fourteen) days. 6 pen 0  . amLODipine (NORVASC) 10 MG tablet Take 10 mg by mouth daily.     Marland Kitchen anastrozole (ARIMIDEX) 1 MG tablet Take 1 tablet (1 mg total) by mouth daily. 90 tablet 4  . cetirizine (ZYRTEC) 10 MG tablet Take 10 mg by mouth daily as needed for allergies.   11  . ibuprofen (ADVIL,MOTRIN) 200 MG tablet Take 400 mg by mouth every 6 (six) hours as needed for headache or moderate pain.    Marland Kitchen levothyroxine (SYNTHROID, LEVOTHROID) 50 MCG tablet Take 50 mcg by mouth daily.    Marland Kitchen lidocaine-prilocaine (EMLA) cream Apply 1 application topically as  needed (port access).    Marland Kitchen losartan-hydrochlorothiazide (HYZAAR) 100-12.5 MG tablet Take 1 tablet by mouth daily.   3  . metoprolol succinate (TOPROL-XL) 50 MG 24 hr tablet Take 1 tablet (50 mg total) by mouth daily. 90 tablet 3  . mometasone-formoterol (DULERA) 100-5 MCG/ACT AERO Inhale 2 puffs into the lungs 2 (two) times daily. 3 Inhaler 0  . Naphazoline-Glycerin (REDNESS RELIEF OP) Place 1 drop into both eyes daily as needed (redness).    . nitroGLYCERIN (NITROSTAT) 0.4 MG SL tablet Place 0.4 mg under the tongue every 5 (five) minutes as needed for chest pain (she has not had to take this medicine in a long time. she has it at home).     . potassium chloride (K-DUR) 10 MEQ tablet Take 2 tablets (20 mEq total) by mouth daily. 60 tablet 0  . traMADol (ULTRAM) 50 MG tablet Take 1 tablet (50 mg total) by mouth every 8 (eight) hours as needed (pain). 45 tablet 0   No current facility-administered medications for this visit.     OBJECTIVE:  Vitals:   06/22/18 0823  BP: 124/63  Pulse: 71  Resp: 18  Temp: 98.4 F (36.9 C)  SpO2: 99%     Body mass index is 27.93 kg/m.   Wt Readings from Last 3 Encounters:  06/22/18 160 lb 3.2 oz (72.7 kg)  06/11/18 158 lb 9.6 oz (71.9 kg)  06/04/18 160 lb 12.8 oz (72.9 kg)   ECOG FS:1 - Symptomatic but completely ambulatory  GENERAL: Patient is a well appearing female in no acute distress HEENT:  Sclerae anicteric.  Oropharynx clear and moist. No ulcerations or evidence of oropharyngeal candidiasis. Neck is supple.  NODES:  No cervical, supraclavicular, or axillary lymphadenopathy palpated.  BREAST EXAM:  Right breast s/p radiation and lumpectomy, no nodules, masses noted, left breast without nodules, masses, skin or nipple changes.   LUNGS:  Clear to auscultation bilaterally.  No wheezes or rhonchi. HEART:  Regular rate and rhythm. No murmur appreciated. ABDOMEN:  Soft, nontender.  Positive, normoactive bowel sounds. No organomegaly palpated. MSK:  No  focal spinal tenderness to palpation. Full range of motion bilaterally in the upper extremities.  EXTREMITIES:  No peripheral edema.   SKIN:  Clear with no obvious rashes or skin changes. No nail dyscrasia. NEURO:  Nonfocal. Well oriented.  Appropriate affect.    LAB RESULTS:  CMP     Component Value Date/Time   NA 140 06/01/2018 0804   NA 138 12/24/2017 0752   K 3.3 (L) 06/01/2018 0804   K 3.3 (L) 12/24/2017 0752   CL 102 06/01/2018 0804   CO2 23 06/01/2018 0804   CO2 25 12/24/2017 0752   GLUCOSE 95 06/01/2018 0804   GLUCOSE 99 12/24/2017 0752   BUN 18 06/01/2018 0804   BUN 7.9 12/24/2017 0752   CREATININE 1.05 06/01/2018 0804   CREATININE 0.7 12/24/2017 0752   CALCIUM 10.6 (H) 06/01/2018 0804   CALCIUM 9.2 12/24/2017 0752   PROT 7.4 06/01/2018 0804   PROT 6.3 (L) 12/24/2017 0752   ALBUMIN 3.7 06/01/2018 0804   ALBUMIN 3.4 (L) 12/24/2017 0752   AST 13 06/01/2018 0804   AST 15 12/24/2017 0752   ALT 8 06/01/2018 0804   ALT 18 12/24/2017 0752   ALKPHOS 94 06/01/2018 0804   ALKPHOS 93 12/24/2017 0752   BILITOT <0.2 (L) 06/01/2018 0804   BILITOT <0.22 12/24/2017 0752   GFRNONAA 53 (L) 06/01/2018 0804   GFRAA >60 06/01/2018 0804    No results found for: Ronnald Ramp, A1GS, A2GS, BETS, BETA2SER, GAMS, MSPIKE, SPEI  No results found for: Nils Pyle, [facility]  Lab Results  Component Value Date   WBC 3.5 (L) 06/22/2018   NEUTROABS 1.8 06/22/2018   HGB 11.7 06/22/2018   HCT 35.0 06/22/2018   MCV 90.1 06/22/2018   PLT 286 06/22/2018      Chemistry      Component Value Date/Time   NA 140 06/01/2018 0804   NA 138 12/24/2017 0752   K 3.3 (L) 06/01/2018 0804   K 3.3 (L) 12/24/2017 0752   CL 102 06/01/2018 0804   CO2 23 06/01/2018 0804   CO2 25 12/24/2017 0752   BUN 18 06/01/2018 0804   BUN 7.9 12/24/2017 0752   CREATININE 1.05 06/01/2018 0804   CREATININE 0.7 12/24/2017 0752      Component Value Date/Time   CALCIUM 10.6 (H)  06/01/2018 0804   CALCIUM 9.2 12/24/2017 0752   ALKPHOS 94 06/01/2018 0804   ALKPHOS 93 12/24/2017 0752   AST 13 06/01/2018 0804   AST 15 12/24/2017 0752   ALT 8 06/01/2018 0804   ALT 18 12/24/2017 0752   BILITOT <0.2 (L) 06/01/2018 0804   BILITOT <0.22 12/24/2017 0752       No results found for: LABCA2  No components found for: [id number]    Appointment on 06/22/2018  Component Date Value Ref Range Status  . WBC 06/22/2018 3.5* 3.9 - 10.3 K/uL Final  . RBC 06/22/2018 3.89  3.70 - 5.45 MIL/uL Final  . Hemoglobin 06/22/2018 11.7  11.6 - 15.9 g/dL Final  . HCT 06/22/2018 35.0  34.8 - 46.6 % Final  . MCV 06/22/2018 90.1  79.5 - 101.0 fL Final  . MCH 06/22/2018 30.0  25.1 - 34.0 pg Final  . MCHC 06/22/2018 33.3  31.5 - 36.0 g/dL Final  . RDW 06/22/2018 20.0* 11.2 - 14.5 % Final  . Platelets 06/22/2018 286  145 - 400 K/uL Final  . Neutrophils Relative % 06/22/2018 50  % Final  . Neutro Abs 06/22/2018 1.8  1.5 - 6.5 K/uL Final  . Lymphocytes Relative 06/22/2018 27  % Final  . Lymphs Abs  06/22/2018 0.9  0.9 - 3.3 K/uL Final  . Monocytes Relative 06/22/2018 16  % Final  . Monocytes Absolute 06/22/2018 0.5  0.1 - 0.9 K/uL Final  . Eosinophils Relative 06/22/2018 6  % Final  . Eosinophils Absolute 06/22/2018 0.2  0.0 - 0.5 K/uL Final  . Basophils Relative 06/22/2018 1  % Final  . Basophils Absolute 06/22/2018 0.0  0.0 - 0.1 K/uL Final   Performed at [facility], [address] [place], [place] [address]    (this displays the last labs from the last 3 days)  No results found for: TOTALPROTELP, ALBUMINELP, A1GS, A2GS, BETS, BETA2SER, GAMS, MSPIKE, SPEI (this displays SPEP labs)  No results found for: KPAFRELGTCHN, LAMBDASER, KAPLAMBRATIO (kappa/lambda light chains)  No results found for: HGBA, HGBA2QUANT, HGBFQUANT, HGBSQUAN (Hemoglobinopathy evaluation)   No results found for: LDH  No results found for: IRON, TIBC, IRONPCTSAT (Iron and  TIBC)  No results found for: FERRITIN  Urinalysis    Component Value Date/Time   LABSPEC 1.020 02/10/2011 2058   PHURINE 6.0 02/10/2011 2058   HGBUR LARGE (A) 02/10/2011 2058   BILIRUBINUR MODERATE (A) 02/10/2011 2058   KETONESUR 15 (A) 02/10/2011 2058   PROTEINUR >=300 (A) 02/10/2011 2058   UROBILINOGEN >=8.0 02/10/2011 2058   NITRITE NEGATIVE 02/10/2011 2058   LEUKOCYTESUR  02/10/2011 2058    NEGATIVE Biochemical Testing Only. Please order routine urinalysis from main lab if confirmatory testing is needed.     STUDIES: No results found.  ELIGIBLE FOR AVAILABLE RESEARCH PROTOCOL: no  ASSESSMENT: 67 y.o. Tracie [name] woman status post right breast upper outer quadrant and right axillary lymph node biopsy 09/03/2017, both positive for invasive ductal carcinoma, grade 3, estrogen receptor positive, progesterone receptor negative, with an MIB-1 of 90%, the lymph node being HER-2 positive, the breast mass HER-2 negative  (a) staging studies September 24, 2017 show a lytic lesion in T8, possible areas of concern at L1-L2  (b) biopsy/kyphoplasty/osteocool of T8 lesion 11/02/2017 confirms metastatic adenocarcinoma, 20% estrogen receptor positive, estrogen receptor negative  (c) PET scan 11/30/2017 shows no visceral disease, minimal metabolic activity at T8, no other bone lesions  (1) neoadjuvant chemotherapy with carboplatin, docetaxel, trastuzumab, and pertuzumab started 10/01/2017, completed 6 cycles 01/26/2018  (a) Docetaxel dropped after three cycles and Gemcitabine/Carbo started on 12/15/2017.   (2) trastuzumab to continue indefinitely  (a) echocardiogram 11/38/2018 shows an ejection fraction in the 55-60% range  (b) echocardiogram 03/01/2018 shows an ejection fraction in the 60-65% range  (c) echocardiogram 06/04/2018 shows an EF of 60-65%  (3) right lumpectomy and sentinel lymph node sampling 03/08/2018  Showed a pT1(mic) pN0 invasive ductal carcinoma, grade 2, with negative  margins.  (4) adjuvant radiation completed 05/11/2018  (a) radiation to her oligo metastatic disease at T8 (18 Gy)  (5) anastrozole to start 05/29/2018  (6) zolendronate to start 08/03/2018, repeated every 12 weeks (delayed due to dental extractions/work)  PLAN: Tracie [name] is doing well today.  Her labs are stable and she will proceed with Trastuzumab today.  Her echocardiogram on 06/04/18 was normal.  She will continue with Trastuzumab every three weeks.    She is also tolerating Anastrozole well and will continue this.  I am encouraged that she has not had any adverse effects as of yet.    Due to her extractions, recently completed yesterday, we will hold bisphosphanate therapy until she meets with Dr. Jana Hakim on 08/03/2018 to ensure she is healed and ready to proceed with therapy.  Tracie [name] has not yet had her scans set up.  I have asked my nurse Tracie [name] to call central scheduling so that these scans can be coordinated for the patient and scheduled prior to her f/u with Dr. Jana Hakim.    Tracie [name] will return in 3 weeks for labs and Trastuzumab, and in 6 weeks for labs, f/u with Dr. Jana Hakim, and Trastuzumab.  She knows to call for any other issues that may develop before the next visit.   A total of (30) minutes of face-to-face time was spent with this patient with greater than 50% of that time in counseling and care-coordination.   Wilber Bihari, NP  06/22/18 8:42 AM Medical Oncology and Hematology [facility] [address] [place], [place] [address] Tel. [phone]    Fax. [phone]

## 2018-06-22 NOTE — Telephone Encounter (Signed)
Call made to central scheduling to contact patient patient to set up scans for August per patient request.

## 2018-06-22 NOTE — Patient Instructions (Signed)
[facility] Discharge Instructions for Patients Receiving Chemotherapy  Today you received the following chemotherapy agents :  Herceptin.  To help prevent nausea and vomiting after your treatment, we encourage you to take your nausea medication as prescribed.   If you develop nausea and vomiting that is not controlled by your nausea medication, call the clinic.   BELOW ARE SYMPTOMS THAT SHOULD BE REPORTED IMMEDIATELY:  *FEVER GREATER THAN 100.5 F  *CHILLS WITH OR WITHOUT FEVER  NAUSEA AND VOMITING THAT IS NOT CONTROLLED WITH YOUR NAUSEA MEDICATION  *UNUSUAL SHORTNESS OF BREATH  *UNUSUAL BRUISING OR BLEEDING  TENDERNESS IN MOUTH AND THROAT WITH OR WITHOUT PRESENCE OF ULCERS  *URINARY PROBLEMS  *BOWEL PROBLEMS  UNUSUAL RASH Items with * indicate a potential emergency and should be followed up as soon as possible.  Feel free to call the clinic should you have any questions or concerns. The clinic phone number is [phone].  Please show the [facility] at check-in to the Emergency Department and triage nurse.   Hypokalemia Hypokalemia means that the amount of potassium in the blood is lower than normal.Potassium is a chemical that helps regulate the amount of fluid in the body (electrolyte). It also stimulates muscle tightening (contraction) and helps nerves work properly.Normally, most of the body's potassium is inside of cells, and only a very small amount is in the blood. Because the amount in the blood is so small, minor changes to potassium levels in the blood can be life-threatening. What are the causes? This condition may be caused by:  Antibiotic medicine.  Diarrhea or vomiting. Taking too much of a medicine that helps you have a bowel movement (laxative) can cause diarrhea and lead to hypokalemia.  Chronic kidney disease (CKD).  Medicines that help the body get rid of excess fluid (diuretics).  Eating disorders, such as bulimia.  Low  magnesium levels in the body.  Sweating a lot.  What are the signs or symptoms? Symptoms of this condition include:  Weakness.  Constipation.  Fatigue.  Muscle cramps.  Mental confusion.  Skipped heartbeats or irregular heartbeat (palpitations).  Tingling or numbness.  How is this diagnosed? This condition is diagnosed with a blood test. How is this treated? Hypokalemia can be treated by taking potassium supplements by mouth or adjusting the medicines that you take. Treatment may also include eating more foods that contain a lot of potassium. If your potassium level is very low, you may need to get potassium through an IV tube in one of your veins and be monitored in the hospital. Follow these instructions at home:  Take over-the-counter and prescription medicines only as told by your health care provider. This includes vitamins and supplements.  Eat a healthy diet. A healthy diet includes fresh fruits and vegetables, whole grains, healthy fats, and lean proteins.  If instructed, eat more foods that contain a lot of potassium, such as: ? Nuts, such as peanuts and pistachios. ? Seeds, such as sunflower seeds and pumpkin seeds. ? Peas, lentils, and lima beans. ? Whole grain and bran cereals and breads. ? Fresh fruits and vegetables, such as apricots, avocado, bananas, cantaloupe, kiwi, oranges, tomatoes, asparagus, and potatoes. ? Orange juice. ? Tomato juice. ? Red meats. ? Yogurt.  Keep all follow-up visits as told by your health care provider. This is important. Contact a health care provider if:  You have weakness that gets worse.  You feel your heart pounding or racing.  You vomit.  You have  diarrhea.  You have diabetes (diabetes mellitus) and you have trouble keeping your blood sugar (glucose) in your target range. Get help right away if:  You have chest pain.  You have shortness of breath.  You have vomiting or diarrhea that lasts for more than 2  days.  You faint. This information is not intended to replace advice given to you by your health care provider. Make sure you discuss any questions you have with your health care provider. Document Released: 12/15/2005 Document Revised: 08/02/2016 Document Reviewed: 08/02/2016 Elsevier Interactive Patient Education  2018 [employer].

## 2018-06-22 NOTE — Progress Notes (Signed)
NO Zometa today per the patient due to dental work yesterday

## 2018-07-13 ENCOUNTER — Inpatient Hospital Stay: Payer: 59 | Attending: Oncology

## 2018-07-13 ENCOUNTER — Other Ambulatory Visit: Payer: Self-pay | Admitting: Oncology

## 2018-07-13 ENCOUNTER — Inpatient Hospital Stay: Payer: 59

## 2018-07-13 VITALS — BP 127/64 | HR 68 | Temp 98.6°F | Resp 16 | Ht 63.5 in | Wt 160.4 lb

## 2018-07-13 DIAGNOSIS — Z170 Estrogen receptor positive status [ER+]: Secondary | ICD-10-CM

## 2018-07-13 DIAGNOSIS — C773 Secondary and unspecified malignant neoplasm of axilla and upper limb lymph nodes: Secondary | ICD-10-CM | POA: Insufficient documentation

## 2018-07-13 DIAGNOSIS — Z5112 Encounter for antineoplastic immunotherapy: Secondary | ICD-10-CM | POA: Diagnosis present

## 2018-07-13 DIAGNOSIS — C50411 Malignant neoplasm of upper-outer quadrant of right female breast: Secondary | ICD-10-CM

## 2018-07-13 LAB — CBC WITH DIFFERENTIAL/PLATELET
BASOS ABS: 0 10*3/uL (ref 0.0–0.1)
Basophils Relative: 2 %
EOS PCT: 5 %
Eosinophils Absolute: 0.2 10*3/uL (ref 0.0–0.5)
HCT: 34.3 % — ABNORMAL LOW (ref 34.8–46.6)
Hemoglobin: 11.4 g/dL — ABNORMAL LOW (ref 11.6–15.9)
LYMPHS PCT: 24 %
Lymphs Abs: 0.8 10*3/uL — ABNORMAL LOW (ref 0.9–3.3)
MCH: 30 pg (ref 25.1–34.0)
MCHC: 33.3 g/dL (ref 31.5–36.0)
MCV: 90.1 fL (ref 79.5–101.0)
MONO ABS: 0.5 10*3/uL (ref 0.1–0.9)
MONOS PCT: 14 %
Neutro Abs: 1.9 10*3/uL (ref 1.5–6.5)
Neutrophils Relative %: 55 %
PLATELETS: 295 10*3/uL (ref 145–400)
RBC: 3.8 MIL/uL (ref 3.70–5.45)
RDW: 19.7 % — ABNORMAL HIGH (ref 11.2–14.5)
WBC: 3.3 10*3/uL — ABNORMAL LOW (ref 3.9–10.3)

## 2018-07-13 LAB — COMPREHENSIVE METABOLIC PANEL
ALBUMIN: 3.7 g/dL (ref 3.5–5.0)
ALT: 17 U/L (ref 0–44)
AST: 18 U/L (ref 15–41)
Alkaline Phosphatase: 76 U/L (ref 38–126)
Anion gap: 9 (ref 5–15)
BUN: 14 mg/dL (ref 8–23)
CO2: 28 mmol/L (ref 22–32)
Calcium: 9.7 mg/dL (ref 8.9–10.3)
Chloride: 104 mmol/L (ref 98–111)
Creatinine, Ser: 0.89 mg/dL (ref 0.44–1.00)
GFR calc Af Amer: >60 mL/min (ref >60)
GFR calc non Af Amer: >60 mL/min (ref >60)
GLUCOSE: 89 mg/dL (ref 70–99)
POTASSIUM: 3.3 mmol/L — AB (ref 3.5–5.1)
Sodium: 141 mmol/L (ref 135–145)
TOTAL PROTEIN: 7 g/dL (ref 6.5–8.1)

## 2018-07-13 MED ORDER — DIPHENHYDRAMINE HCL 25 MG PO CAPS
ORAL_CAPSULE | ORAL | Status: AC
Start: 2018-07-13 — End: ?
  Filled 2018-07-13: qty 1

## 2018-07-13 MED ORDER — SODIUM CHLORIDE 0.9% FLUSH
10.0000 mL | INTRAVENOUS | Status: DC | PRN
  Administered 2018-07-13: 10 mL
  Filled 2018-07-13: qty 10

## 2018-07-13 MED ORDER — HEPARIN SOD (PORK) LOCK FLUSH 100 UNIT/ML IV SOLN
500.0000 [IU] | Freq: Once | INTRAVENOUS | Status: AC | PRN
  Administered 2018-07-13: 500 [IU]
  Filled 2018-07-13: qty 5

## 2018-07-13 MED ORDER — SODIUM CHLORIDE 0.9 % IV SOLN
450.0000 mg | Freq: Once | INTRAVENOUS | Status: AC
  Administered 2018-07-13: 450 mg via INTRAVENOUS
  Filled 2018-07-13: qty 21.43

## 2018-07-13 MED ORDER — DIPHENHYDRAMINE HCL 25 MG PO CAPS
25.0000 mg | ORAL_CAPSULE | Freq: Once | ORAL | Status: AC
  Administered 2018-07-13: 25 mg via ORAL

## 2018-07-13 MED ORDER — SODIUM CHLORIDE 0.9 % IV SOLN
Freq: Once | INTRAVENOUS | Status: AC
  Administered 2018-07-13: 11:00:00 via INTRAVENOUS

## 2018-07-13 MED ORDER — ACETAMINOPHEN 325 MG PO TABS
ORAL_TABLET | ORAL | Status: AC
  Filled 2018-07-13: qty 2

## 2018-07-13 MED ORDER — ACETAMINOPHEN 325 MG PO TABS
650.0000 mg | ORAL_TABLET | Freq: Once | ORAL | Status: AC
  Administered 2018-07-13: 650 mg via ORAL

## 2018-07-13 NOTE — Patient Instructions (Signed)
[facility] Discharge Instructions for Patients Receiving Chemotherapy  Today you received the following chemotherapy agents:  Herceptin (trastuzmab)   To help prevent nausea and vomiting after your treatment, we encourage you to take your nausea medication as prescribed.   If you develop nausea and vomiting that is not controlled by your nausea medication, call the clinic.   BELOW ARE SYMPTOMS THAT SHOULD BE REPORTED IMMEDIATELY:  *FEVER GREATER THAN 100.5 F  *CHILLS WITH OR WITHOUT FEVER  NAUSEA AND VOMITING THAT IS NOT CONTROLLED WITH YOUR NAUSEA MEDICATION  *UNUSUAL SHORTNESS OF BREATH  *UNUSUAL BRUISING OR BLEEDING  TENDERNESS IN MOUTH AND THROAT WITH OR WITHOUT PRESENCE OF ULCERS  *URINARY PROBLEMS  *BOWEL PROBLEMS  UNUSUAL RASH Items with * indicate a potential emergency and should be followed up as soon as possible.  Feel free to call the clinic should you have any questions or concerns. The clinic phone number is [phone].  Please show the [facility] at check-in to the Emergency Department and triage nurse.

## 2018-07-30 ENCOUNTER — Ambulatory Visit (HOSPITAL_COMMUNITY)
Admission: RE | Admit: 2018-07-30 | Discharge: 2018-07-30 | Disposition: A | Discharge status: Home / Self Care | Payer: 59 | Source: Ambulatory Visit | Attending: Oncology | Admitting: Oncology

## 2018-07-30 ENCOUNTER — Encounter (HOSPITAL_COMMUNITY)
Admission: RE | Admit: 2018-07-30 | Discharge: 2018-07-30 | Disposition: A | Discharge status: Home / Self Care | Payer: 59 | Source: Ambulatory Visit | Attending: Oncology | Admitting: Oncology

## 2018-07-30 ENCOUNTER — Other Ambulatory Visit (HOSPITAL_COMMUNITY): Payer: 59

## 2018-07-30 ENCOUNTER — Encounter (HOSPITAL_COMMUNITY): Payer: Self-pay

## 2018-07-30 DIAGNOSIS — C7951 Secondary malignant neoplasm of bone: Secondary | ICD-10-CM

## 2018-07-30 DIAGNOSIS — C50411 Malignant neoplasm of upper-outer quadrant of right female breast: Secondary | ICD-10-CM

## 2018-07-30 DIAGNOSIS — Z170 Estrogen receptor positive status [ER+]: Secondary | ICD-10-CM

## 2018-07-30 DIAGNOSIS — Z9889 Other specified postprocedural states: Secondary | ICD-10-CM | POA: Diagnosis not present

## 2018-07-30 DIAGNOSIS — M47812 Spondylosis without myelopathy or radiculopathy, cervical region: Secondary | ICD-10-CM | POA: Insufficient documentation

## 2018-07-30 DIAGNOSIS — M47814 Spondylosis without myelopathy or radiculopathy, thoracic region: Secondary | ICD-10-CM | POA: Diagnosis not present

## 2018-07-30 DIAGNOSIS — M47816 Spondylosis without myelopathy or radiculopathy, lumbar region: Secondary | ICD-10-CM | POA: Diagnosis not present

## 2018-07-30 MED ORDER — TECHNETIUM TC 99M MEDRONATE IV KIT
19.4000 | PACK | Freq: Once | INTRAVENOUS | Status: AC | PRN
  Administered 2018-07-30: 19.4 via INTRAVENOUS

## 2018-07-30 MED ORDER — GADOBENATE DIMEGLUMINE 529 MG/ML IV SOLN
15.0000 mL | Freq: Once | INTRAVENOUS | Status: AC | PRN
  Administered 2018-07-30: 15 mL via INTRAVENOUS

## 2018-07-30 MED ORDER — IOHEXOL 300 MG/ML  SOLN
75.0000 mL | Freq: Once | INTRAMUSCULAR | Status: AC | PRN
  Administered 2018-07-30: 75 mL via INTRAVENOUS

## 2018-07-30 MED ORDER — HEPARIN SOD (PORK) LOCK FLUSH 100 UNIT/ML IV SOLN
INTRAVENOUS | Status: AC
  Filled 2018-07-30: qty 5

## 2018-07-30 MED ORDER — TECHNETIUM TC 99M MEDRONATE IV KIT: 20.0000 | PACK | Freq: Once | INTRAVENOUS | Status: DC | PRN

## 2018-08-01 NOTE — Progress Notes (Signed)
Tracie [name]  [phone] [phone]     ID: Tracie [name] DOB: 31-Jan-1951  MR#: [id number]  EXN#:[id number]  Patient Care Team: Bartholome Bill, MD as PCP - General (Family Medicine) [name], MD as Consulting Physician (General Surgery) Brentlee Delage, [name], MD as Consulting Physician (Oncology) [name], MD as Attending Physician (Radiation Oncology) Mottinger, [place] DDS (Physical Therapy) [name], MD as Consulting Physician (Cardiology) Margarette Canada (Inactive) Margarette Canada (Inactive) as Consulting Physician (Dentistry) OTHER MD:  CHIEF COMPLAINT: Triple positive breast cancer  CURRENT TREATMENT: trastuzumab; anastrozole, (zolendronate)   HISTORY OF CURRENT ILLNESS: From the original intake note:  Tracie [name] noted a change in her right breast sometime in July 2018. [facility] to report that she had found a "kernel" in her breast. She was advised to see her primary physician which she did. The patient was then set up for bilateral diagnostic mammography with tomography and right breast ultrasonography at [facility] 09/03/2017. The breast density was category I a. In the upper outer quadrant of the right breast there was a 2.1 cm high density mass, which was palpable. Also in the right breast more posteriorly there was a 1.5 cm high density mass which was felt to be a suspicious lymph node. Ultrasound of the right breast confirmed a 2.1 centimeter lobulated solid mass in the right breast upper outer quadrant 15.8 cm from the nipple in the 10:00 radiant. There was a 1.5 cm oval mass in the right axillary tail with a small rounded masses also suspicious for lymph node involvement. A total of 3 suspicious lymph nodes were identified.  On 09/03/2017 the patient underwent biopsy of the breast mass and the suspicious right axillary lymph node. The final pathology ([id number]) found invasive ductal carcinoma, grade  3, in both. Both tumors were estrogen receptor positive at 70-75%, and both were progesterone receptor negative. Both had an elevated proliferation marker at 90%. The mass in the breast was HER-2 negative, with a signals ratio of 1.25 and the number per cell 1.88. The lymph node mass however was HER-2 positive with a signals ratio of 2.57, and the number per cell 3.60.  The patient's subsequent history is as detailed below.  INTERVAL HISTORY: Tracie [name] returns today for follow-up and treatment of her triple positive breast cancer. She receives trastuzumab every 21 days, with a dose due today. She tolerates this well. She denies nausea, diarrhea, or fatigue. Most recent echocardiogram was 06/04/2018 showing grade 2 diastolic dysfunction but a good ejection fraction.   She continues on anastrozole, with good tolerance. She denies issues with hot flashes or vaginal dryness.   She completed staging studies with a brain MRI on 07/30/2018 showing: Negative for metastatic disease to the brain or calvarium. Chronic microvascular ischemic changes in the white matter. Negative for acute infarct  She also had a chest CT on 07/30/2018 showing: Similar-appearing postprocedural changes involving the T8 vertebral body. No evidence for metastatic disease elsewhere within the chest. Interval post treatment changes involving the right breast.  Finally a whole body bone scan on 07/30/2018 showed: Stable cervical, thoracic and lumbar spine degenerative changes. Stable left elbow and bilateral knee and foot degenerative changes. Two foci of increased tracer activity in the mandible compatible with dental disease and the recent dental surgery. No findings suspicious for bony metastatic disease.  We have not started zolendronate yet because of her relatively recent oral surgery.     REVIEW OF SYSTEMS:  Tracie [name] reports that she is going to consider joining the smoking cessation program. She is also going to try a Nicotine  patch. She followed up with her dentist, Dr. Deanna Artis and Dr. Carollee Herter about new dental work. She walks daily for exercise. She is also active with her 2 grandchildren. She recently moved into a new home. She denies unusual headaches, visual changes, nausea, vomiting, or dizziness. There has been no unusual cough, phlegm production, or pleurisy. This been no change in bowel or bladder habits. She denies unexplained fatigue or unexplained weight loss, bleeding, rash, or fever. A detailed review of systems was otherwise stable.     PAST MEDICAL HISTORY: Past Medical History:  Diagnosis Date  . Angina pectoris ([place])    history of   . Arthritis   . Asthma   . Cancer ([place])    right breast  . Constipation   . Diverticulitis   . History of radiation therapy 04/07/18- 05/11/18   50 Gy in 25 fractions to right breast and regional nodes with four fields (no boost)  . History of radiation therapy 04/21/2018   T8 spine, 18 Gy in 1 fraction for a total dose of 18 Gy.   Marland Kitchen HPV in female   . Hyperlipidemia   . Hypertension   . Hypothyroidism   . Wears dentures   . Wears glasses     PAST SURGICAL HISTORY: Past Surgical History:  Procedure Laterality Date  . BREAST LUMPECTOMY WITH RADIOACTIVE SEED AND SENTINEL LYMPH NODE BIOPSY Right 03/08/2018   Procedure: RIGHT BREAST RADIOACTIVE SEED GUIDED LUMPECTOMY WITH RIGHT AXILLARY RADIOACTIVE SEED TARGETED LYMPH NODE EXCISION AND SENTINEL LYMPH NODE BIOPSY, INJECT BLUE DYE RIGHT BREAST;  Surgeon: [name], MD;  Location: [facility];  Service: General;  Laterality: Right;  . CESAREAN SECTION    . COLON SURGERY  2012   sigmoidectomy  . COLOSTOMY    . COLOSTOMY TAKEDOWN  10/06/11  . IR BONE TUMOR(S)RF ABLATION  11/02/2017  . IR KYPHO THORACIC WITH BONE BIOPSY  11/02/2017  . IR RADIOLOGIST EVAL & MGMT  10/07/2017  . IR RADIOLOGIST EVAL & MGMT  11/25/2017  . PORTACATH PLACEMENT N/A 09/25/2017   Procedure: INSERTION PORT-A-CATH WITH ULTRA SOUND ERAS PATHWAY;   Surgeon: [name], MD;  Location: [facility];  Service: General;  Laterality: N/A;  . TONSILLECTOMY    . TUBAL LIGATION      FAMILY HISTORY Family History  Problem Relation Age of Onset  . Cancer Mother        rectal  . Hypertension Brother   . Hypertension Brother   . Asthma Son   . Asthma Brother    The patient has little information regarding her father. Her mother died at age 8 from a strange cancer--the patient does not know what it was, it may well have been cervical cancer from her description. The patient has one brother, no sisters. There is no history of breast or ovarian cancer in the family to the patient's knowledge   GYNECOLOGIC HISTORY:  No LMP recorded. Patient is postmenopausal.  menarche age 9, first live birth age 37 the patient is [place] P3. She stopped having periods at age 55. She never used oral contraceptives or hormone replacement.   SOCIAL HISTORY:  Works as a Scientist, product/process development. She is divorced. Currently her son Tracie [name] lives with her. He is a Art gallery manager. The 2 other children are Tracie [name], who lives in [place] and works in the theater and media,  and Tracie [name] lives in [place], and is Engineer, maintenance of a [employer]. The patient has 5 grandchildren. She is a Psychologist, forensic.    ADVANCED DIRECTIVES: Not in place    HEALTH MAINTENANCE: Social History   Tobacco Use  . Smoking status: Current Some Day Smoker    Packs/day: 0.25    Years: 40.00    Pack years: 10.00    Types: Cigarettes  . Smokeless tobacco: Never Used  . Tobacco comment: she is smoking 2 or more daily, she plans to use nicotine patch.   Substance Use Topics  . Alcohol use: Yes    Comment: occasional  . Drug use: No     Colonoscopy: September 2012   PAP:  Bone density:09/03/2017    Allergies  Allergen Reactions  . Fish Allergy Anaphylaxis and Swelling    Swelling of hands and feet., non shellfish allergy  . Other Anaphylaxis    Tree nuts     . Ace Inhibitors Other (See Comments) and Cough    Cold like symptoms   . Tylenol [Acetaminophen] Other (See Comments)    Headache     Current Outpatient Medications  Medication Sig Dispense Refill  . acetaminophen-codeine (TYLENOL #3) 300-30 MG tablet     . albuterol (VENTOLIN HFA) 108 (90 BASE) MCG/ACT inhaler Inhale 1-2 puffs into the lungs every 6 (six) hours as needed for wheezing or shortness of breath. (Patient taking differently: Inhale 2 puffs into the lungs every 6 (six) hours as needed for wheezing or shortness of breath. ) 1 Inhaler 6  . Alirocumab (PRALUENT) 75 MG/ML SOPN Inject 75 mg into the muscle every 14 (fourteen) days. 6 pen 0  . amLODipine (NORVASC) 10 MG tablet Take 10 mg by mouth daily.     Marland Kitchen anastrozole (ARIMIDEX) 1 MG tablet Take 1 tablet (1 mg total) by mouth daily. 90 tablet 4  . cetirizine (ZYRTEC) 10 MG tablet Take 10 mg by mouth daily as needed for allergies.   11  . ibuprofen (ADVIL,MOTRIN) 200 MG tablet Take 400 mg by mouth every 6 (six) hours as needed for headache or moderate pain.    Marland Kitchen levothyroxine (SYNTHROID, LEVOTHROID) 50 MCG tablet Take 50 mcg by mouth daily.    Marland Kitchen lidocaine-prilocaine (EMLA) cream Apply 1 application topically as needed (port access). 30 g 0  . losartan-hydrochlorothiazide (HYZAAR) 100-12.5 MG tablet Take 1 tablet by mouth daily.   3  . metoprolol succinate (TOPROL-XL) 50 MG 24 hr tablet Take 1 tablet (50 mg total) by mouth daily. 90 tablet 3  . mometasone-formoterol (DULERA) 100-5 MCG/ACT AERO Inhale 2 puffs into the lungs 2 (two) times daily. 3 Inhaler 0  . Naphazoline-Glycerin (REDNESS RELIEF OP) Place 1 drop into both eyes daily as needed (redness).    . nitroGLYCERIN (NITROSTAT) 0.4 MG SL tablet Place 0.4 mg under the tongue every 5 (five) minutes as needed for chest pain (she has not had to take this medicine in a long time. she has it at home).     . potassium chloride (K-DUR) 10 MEQ tablet Take 2 tablets (20 mEq total) by mouth  daily. 60 tablet 0  . traMADol (ULTRAM) 50 MG tablet Take 1 tablet (50 mg total) by mouth every 8 (eight) hours as needed (pain). 45 tablet 0   No current facility-administered medications for this visit.    Facility-Administered Medications Ordered in Other Visits  Medication Dose Route Frequency Provider Last Rate Last Dose  . technetium medronate (TC-MDP) injection  20 millicurie  20 millicurie Intravenous Once PRN Kerby Moors, MD        OBJECTIVE: Middle-aged African-American woman who appears well Vitals:   08/03/18 0903  BP: (!) 115/57  Pulse: 73  Resp: 18  Temp: 98.5 F (36.9 C)  SpO2: 98%     Body mass index is 28.13 kg/m.   Wt Readings from Last 3 Encounters:  08/03/18 158 lb 12.8 oz (72 kg)  07/13/18 160 lb 6 oz (72.7 kg)  06/22/18 160 lb 3.2 oz (72.7 kg)   ECOG FS:0 - Asymptomatic   Sclerae unicteric, EOMs intact Oropharynx clear and moist No cervical or supraclavicular adenopathy Lungs no rales or rhonchi Heart regular rate and rhythm Abd soft, nontender, positive bowel sounds MSK no focal spinal tenderness, no upper extremity lymphedema Neuro: nonfocal, well oriented, appropriate affect Breasts: The right breast is status post lumpectomy and radiation.  There is hyperpigmentation and some skin coarsening.  There is no evidence of residual or recurrent disease.  The left breast is benign.  Both axillae are benign.     LAB RESULTS:  CMP     Component Value Date/Time   NA 142 08/03/2018 0846   NA 138 12/24/2017 0752   K 3.9 08/03/2018 0846   K 3.3 (L) 12/24/2017 0752   CL 107 08/03/2018 0846   CO2 24 08/03/2018 0846   CO2 25 12/24/2017 0752   GLUCOSE 111 (H) 08/03/2018 0846   GLUCOSE 99 12/24/2017 0752   BUN 11 08/03/2018 0846   BUN 7.9 12/24/2017 0752   CREATININE 0.92 08/03/2018 0846   CREATININE 0.7 12/24/2017 0752   CALCIUM 9.3 08/03/2018 0846   CALCIUM 9.2 12/24/2017 0752   PROT 6.9 08/03/2018 0846   PROT 6.3 (L) 12/24/2017 0752    ALBUMIN 3.5 08/03/2018 0846   ALBUMIN 3.4 (L) 12/24/2017 0752   AST 14 (L) 08/03/2018 0846   AST 15 12/24/2017 0752   ALT 14 08/03/2018 0846   ALT 18 12/24/2017 0752   ALKPHOS 94 08/03/2018 0846   ALKPHOS 93 12/24/2017 0752   BILITOT <0.2 (L) 08/03/2018 0846   BILITOT <0.22 12/24/2017 0752   GFRNONAA >60 08/03/2018 0846   GFRAA >60 08/03/2018 0846    No results found for: Ronnald Ramp, A1GS, A2GS, BETS, BETA2SER, GAMS, MSPIKE, SPEI  No results found for: Nils Pyle, [facility]  Lab Results  Component Value Date   WBC 3.4 (L) 08/03/2018   NEUTROABS 1.9 08/03/2018   HGB 11.2 (L) 08/03/2018   HCT 33.7 (L) 08/03/2018   MCV 91.6 08/03/2018   PLT 304 08/03/2018      Chemistry      Component Value Date/Time   NA 142 08/03/2018 0846   NA 138 12/24/2017 0752   K 3.9 08/03/2018 0846   K 3.3 (L) 12/24/2017 0752   CL 107 08/03/2018 0846   CO2 24 08/03/2018 0846   CO2 25 12/24/2017 0752   BUN 11 08/03/2018 0846   BUN 7.9 12/24/2017 0752   CREATININE 0.92 08/03/2018 0846   CREATININE 0.7 12/24/2017 0752      Component Value Date/Time   CALCIUM 9.3 08/03/2018 0846   CALCIUM 9.2 12/24/2017 0752   ALKPHOS 94 08/03/2018 0846   ALKPHOS 93 12/24/2017 0752   AST 14 (L) 08/03/2018 0846   AST 15 12/24/2017 0752   ALT 14 08/03/2018 0846   ALT 18 12/24/2017 0752   BILITOT <0.2 (L) 08/03/2018 0846   BILITOT <0.22 12/24/2017 0752       No  results found for: LABCA2  No components found for: [id number]    Appointment on 08/03/2018  Component Date Value Ref Range Status  . Sodium 08/03/2018 142  135 - 145 mmol/L Final  . Potassium 08/03/2018 3.9  3.5 - 5.1 mmol/L Final  . Chloride 08/03/2018 107  98 - 111 mmol/L Final  . CO2 08/03/2018 24  22 - 32 mmol/L Final  . Glucose, Bld 08/03/2018 111* 70 - 99 mg/dL Final  . BUN 08/03/2018 11  8 - 23 mg/dL Final  . Creatinine, Ser 08/03/2018 0.92  0.44 - 1.00 mg/dL Final  . Calcium 08/03/2018 9.3  8.9 - 10.3  mg/dL Final  . Total Protein 08/03/2018 6.9  6.5 - 8.1 g/dL Final  . Albumin 08/03/2018 3.5  3.5 - 5.0 g/dL Final  . AST 08/03/2018 14* 15 - 41 U/L Final  . ALT 08/03/2018 14  0 - 44 U/L Final  . Alkaline Phosphatase 08/03/2018 94  38 - 126 U/L Final  . Total Bilirubin 08/03/2018 <0.2* 0.3 - 1.2 mg/dL Final  . GFR calc non Af Amer 08/03/2018 >60  >60 mL/min Final  . GFR calc Af Amer 08/03/2018 >60  >60 mL/min Final   Comment: (NOTE) The eGFR has been calculated using the CKD EPI equation. This calculation has not been validated in all clinical situations. eGFR's persistently <60 mL/min signify possible Chronic Kidney Disease.   Tracie [name] gap 08/03/2018 11  5 - 15 Final   Performed at [facility], [place] [address], [place], [place] [address]  . WBC 08/03/2018 3.4* 3.9 - 10.3 K/uL Final  . RBC 08/03/2018 3.68* 3.70 - 5.45 MIL/uL Final  . Hemoglobin 08/03/2018 11.2* 11.6 - 15.9 g/dL Final  . HCT 08/03/2018 33.7* 34.8 - 46.6 % Final  . MCV 08/03/2018 91.6  79.5 - 101.0 fL Final  . MCH 08/03/2018 30.5  25.1 - 34.0 pg Final  . MCHC 08/03/2018 33.3  31.5 - 36.0 g/dL Final  . RDW 08/03/2018 19.7* 11.2 - 14.5 % Final  . Platelets 08/03/2018 304  145 - 400 K/uL Final  . Neutrophils Relative % 08/03/2018 56  % Final  . Neutro Abs 08/03/2018 1.9  1.5 - 6.5 K/uL Final  . Lymphocytes Relative 08/03/2018 26  % Final  . Lymphs Abs 08/03/2018 0.9  0.9 - 3.3 K/uL Final  . Monocytes Relative 08/03/2018 12  % Final  . Monocytes Absolute 08/03/2018 0.4  0.1 - 0.9 K/uL Final  . Eosinophils Relative 08/03/2018 5  % Final  . Eosinophils Absolute 08/03/2018 0.2  0.0 - 0.5 K/uL Final  . Basophils Relative 08/03/2018 1  % Final  . Basophils Absolute 08/03/2018 0.0  0.0 - 0.1 K/uL Final   Performed at [facility], [address] [place], [place] [address]    (this displays the last labs from the last 3 days)  No results found for: TOTALPROTELP, ALBUMINELP,  A1GS, A2GS, BETS, BETA2SER, GAMS, MSPIKE, SPEI (this displays SPEP labs)  No results found for: KPAFRELGTCHN, LAMBDASER, KAPLAMBRATIO (kappa/lambda light chains)  No results found for: HGBA, HGBA2QUANT, HGBFQUANT, HGBSQUAN (Hemoglobinopathy evaluation)   No results found for: LDH  No results found for: IRON, TIBC, IRONPCTSAT (Iron and TIBC)  No results found for: FERRITIN  Urinalysis    Component Value Date/Time   LABSPEC 1.020 02/10/2011 2058   PHURINE 6.0 02/10/2011 2058   [place] (A) 02/10/2011 2058   BILIRUBINUR MODERATE (A) 02/10/2011 2058   KETONESUR 15 (A) 02/10/2011  2058   PROTEINUR >=300 (A) 02/10/2011 2058   UROBILINOGEN >=8.0 02/10/2011 2058   NITRITE NEGATIVE 02/10/2011 2058   LEUKOCYTESUR  02/10/2011 2058    NEGATIVE Biochemical Testing Only. Please order routine urinalysis from main lab if confirmatory testing is needed.     STUDIES: Ct Chest W Contrast  Result Date: 07/30/2018 CLINICAL DATA:  Patient with breast cancer.  Follow-up exam. EXAM: CT CHEST WITH CONTRAST TECHNIQUE: Multidetector CT imaging of the chest was performed during intravenous contrast administration. CONTRAST:  53m OMNIPAQUE IOHEXOL 300 MG/ML  SOLN COMPARISON:  PET-CT 11/30/2017 FINDINGS: Cardiovascular: Right anterior chest wall Port-A-Cath is present with tip terminating in the superior vena cava. Heart is mildly enlarged. Trace fluid superior pericardial recess. Thoracic aortic vascular calcifications. Mediastinum/Nodes: No enlarged axillary, mediastinal or hilar lymphadenopathy. Small hiatal hernia. Normal appearance of the esophagus. Lungs/Pleura: Central airways are patent. Centrilobular and paraseptal emphysematous change. Dependent atelectasis. No large area pulmonary consolidation. No pleural effusion or pneumothorax. No discrete pulmonary nodules identified. Upper Abdomen: Stable bilateral adrenal gland thickening. No acute process. Musculoskeletal: Kyphoplasty material within the T8  vertebral body. No additional osseous lesions identified. Postsurgical changes within the lateral aspect of the right breast. Skin thickening overlying the right breast, suggestive of post treatment changes. IMPRESSION: 1. Similar-appearing postprocedural changes involving the T8 vertebral body. No evidence for metastatic disease elsewhere within the chest. 2. Interval post treatment changes involving the right breast. Electronically Signed   By: DLovey NewcomerM.D.   On: 07/30/2018 13:57   Mr BJeri CosWRDContrast  Result Date: 07/30/2018 CLINICAL DATA:  Metastatic breast cancer.  Staging EXAM: MRI HEAD WITHOUT AND WITH CONTRAST TECHNIQUE: Multiplanar, multiecho pulse sequences of the brain and surrounding structures were obtained without and with intravenous contrast. CONTRAST:  186mMULTIHANCE GADOBENATE DIMEGLUMINE 529 MG/ML IV SOLN COMPARISON:  None. FINDINGS: Brain: Negative for metastatic disease to the brain. No enhancing lesions identified Ventricle size normal. Temporal lobe atrophy bilaterally right greater than left. Chronic white matter changes bilaterally consistent with microvascular ischemia. No acute infarct. Negative for hemorrhage. Normal enhancement postcontrast administration. Empty sella noted incidentally. Vascular: Normal arterial flow voids Skull and upper cervical spine: Negative for skeletal metastatic disease Sinuses/Orbits: Mild mucosal edema paranasal sinuses. Right mastoid effusion. Normal orbit Other: None IMPRESSION: Negative for metastatic disease to the brain or calvarium. Chronic microvascular ischemic changes in the white matter. Negative for acute infarct. Electronically Signed   By: ChFranchot Gallo.D.   On: 07/30/2018 11:38   Nm Bone Scan Whole Body  Result Date: 07/30/2018 CLINICAL DATA:  Breast cancer. Left elbow pain. Recent dental work. Clinical concern for bone metastases. EXAM: NUCLEAR MEDICINE WHOLE BODY BONE SCAN TECHNIQUE: Whole body anterior and posterior images  were obtained approximately 3 hours after intravenous injection of radiopharmaceutical. RADIOPHARMACEUTICALS:  19.4 mCi Technetium-9929mP IV COMPARISON:  PET-CT dated 11/30/2017 and nuclear medicine bone scan dated 09/24/2017. FINDINGS: Multiple foci of increased tracer activity are again demonstrated in the thoracic spine and the cervical spine and upper lumbar spine. Multifocal increased tracer activity is also again demonstrated in the left elbow. Multiple small foci of increased tracer activity in both knees and feet are again demonstrated. Increased tracer activity in the skin of the right breast, compatible with postradiation change. Two small foci of increased tracer activity in the mandible, 1 previously present. Normal renal and bladder activity. IMPRESSION: 1. Stable cervical, thoracic and lumbar spine degenerative changes. 2. Stable left elbow and bilateral knee and foot degenerative changes.  3. Two foci of increased tracer activity in the mandible compatible with dental disease and the recent dental surgery. 4. No findings suspicious for bony metastatic disease. Electronically Signed   By: Claudie Revering M.D.   On: 07/30/2018 15:59    ELIGIBLE FOR AVAILABLE RESEARCH PROTOCOL: no  ASSESSMENT: 67 y.o. [place] woman status post right breast upper outer quadrant and right axillary lymph node biopsy 09/03/2017, both positive for invasive ductal carcinoma, grade 3, estrogen receptor positive, progesterone receptor negative, with an MIB-1 of 90%, the lymph node being HER-2 positive, the breast mass HER-2 negative  (a) staging studies September 24, 2017 show a lytic lesion in T8, possible areas of concern at L1-L2  (b) biopsy/kyphoplasty/osteocool of T8 lesion 11/02/2017 confirms metastatic adenocarcinoma, 20% estrogen receptor positive, estrogen receptor negative  (c) PET scan 11/30/2017 shows no visceral disease, minimal metabolic activity at T8, no other bone lesions  (1) neoadjuvant chemotherapy  with carboplatin, docetaxel, trastuzumab, and pertuzumab started 10/01/2017, completed 6 cycles 01/26/2018  (a) Docetaxel dropped after three cycles and Gemcitabine/Carbo started on 12/15/2017.   (2) trastuzumab to continue indefinitely  (a) echocardiogram 11/38/2018 shows an ejection fraction in the 55-60% range  (b) echocardiogram 03/01/2018 shows an ejection fraction in the 60-65% range  (c) echocardiogram 06/04/2018 shows an EF of 60-65%  (3) right lumpectomy and sentinel lymph node sampling 03/08/2018  Showed a pT1(mic) pN0 invasive ductal carcinoma, grade 2, with negative margins.  (4) adjuvant radiation  04-07-18 to 05-11-18                                              (a) SITE/DOSE:  Right Breast and Axilla, SCV nodes / 50 Gy in 25 fx                (b) radiation to her oligo metastatic disease at T8 (18 Gy) in 1 fraction, 04/21/2018  (5) anastrozole started 05/29/2018  (6) zolendronate to start NOV 2019, to be repeated every 12 weeks (delayed due to dental extractions/work)  PLAN: Tracie [name] is tolerating her treatments remarkably well and the plan will be to continue anastrozole and trastuzumab indefinitely.  After 1 year we will switch the trastuzumab to every 4 weeks  We are holding the zolendronate until she has had a little bit more time to heal from her dental work but we hope to start it at the end of October  We reviewed all her staging studies which are very favorable at this point  She is already scheduled for her next echocardiogram early in September  She knows to call for any other issues that may develop before the next visit.  Magrinat, [name], MD  08/03/18 9:43 AM Medical Oncology and Hematology [facility] [address] [place], [place] [address] Tel. [phone]    Fax. [phone]  Alice Rieger, am acting as scribe for Chauncey Cruel MD.  I, Lurline Del MD, have reviewed the above documentation for accuracy and  completeness, and I agree with the above.

## 2018-08-03 ENCOUNTER — Telehealth: Payer: Self-pay | Admitting: Oncology

## 2018-08-03 ENCOUNTER — Inpatient Hospital Stay: Payer: 59

## 2018-08-03 ENCOUNTER — Inpatient Hospital Stay: Payer: 59 | Attending: Oncology | Admitting: Oncology

## 2018-08-03 VITALS — BP 115/57 | HR 73 | Temp 98.5°F | Resp 18 | Ht 63.0 in | Wt 158.8 lb

## 2018-08-03 DIAGNOSIS — Z79811 Long term (current) use of aromatase inhibitors: Secondary | ICD-10-CM

## 2018-08-03 DIAGNOSIS — Z5112 Encounter for antineoplastic immunotherapy: Secondary | ICD-10-CM | POA: Diagnosis present

## 2018-08-03 DIAGNOSIS — C773 Secondary and unspecified malignant neoplasm of axilla and upper limb lymph nodes: Secondary | ICD-10-CM

## 2018-08-03 DIAGNOSIS — C50411 Malignant neoplasm of upper-outer quadrant of right female breast: Secondary | ICD-10-CM

## 2018-08-03 DIAGNOSIS — F17210 Nicotine dependence, cigarettes, uncomplicated: Secondary | ICD-10-CM

## 2018-08-03 DIAGNOSIS — Z170 Estrogen receptor positive status [ER+]: Principal | ICD-10-CM

## 2018-08-03 DIAGNOSIS — C7951 Secondary malignant neoplasm of bone: Secondary | ICD-10-CM

## 2018-08-03 LAB — COMPREHENSIVE METABOLIC PANEL
ALBUMIN: 3.5 g/dL (ref 3.5–5.0)
ALT: 14 U/L (ref 0–44)
AST: 14 U/L — ABNORMAL LOW (ref 15–41)
Alkaline Phosphatase: 94 U/L (ref 38–126)
Anion gap: 11 (ref 5–15)
BUN: 11 mg/dL (ref 8–23)
CHLORIDE: 107 mmol/L (ref 98–111)
CO2: 24 mmol/L (ref 22–32)
CREATININE: 0.92 mg/dL (ref 0.44–1.00)
Calcium: 9.3 mg/dL (ref 8.9–10.3)
GFR calc non Af Amer: >60 mL/min (ref >60)
Glucose, Bld: 111 mg/dL — ABNORMAL HIGH (ref 70–99)
Potassium: 3.9 mmol/L (ref 3.5–5.1)
SODIUM: 142 mmol/L (ref 135–145)
Total Bilirubin: <0.2 mg/dL — ABNORMAL LOW (ref 0.3–1.2)
Total Protein: 6.9 g/dL (ref 6.5–8.1)

## 2018-08-03 LAB — CBC WITH DIFFERENTIAL/PLATELET
Basophils Absolute: 0 10*3/uL (ref 0.0–0.1)
Basophils Relative: 1 %
EOS ABS: 0.2 10*3/uL (ref 0.0–0.5)
Eosinophils Relative: 5 %
HCT: 33.7 % — ABNORMAL LOW (ref 34.8–46.6)
HEMOGLOBIN: 11.2 g/dL — AB (ref 11.6–15.9)
Lymphocytes Relative: 26 %
Lymphs Abs: 0.9 10*3/uL (ref 0.9–3.3)
MCH: 30.5 pg (ref 25.1–34.0)
MCHC: 33.3 g/dL (ref 31.5–36.0)
MCV: 91.6 fL (ref 79.5–101.0)
Monocytes Absolute: 0.4 10*3/uL (ref 0.1–0.9)
Monocytes Relative: 12 %
NEUTROS PCT: 56 %
Neutro Abs: 1.9 10*3/uL (ref 1.5–6.5)
Platelets: 304 10*3/uL (ref 145–400)
RBC: 3.68 MIL/uL — ABNORMAL LOW (ref 3.70–5.45)
RDW: 19.7 % — ABNORMAL HIGH (ref 11.2–14.5)
WBC: 3.4 10*3/uL — ABNORMAL LOW (ref 3.9–10.3)

## 2018-08-03 MED ORDER — ACETAMINOPHEN 325 MG PO TABS
650.0000 mg | ORAL_TABLET | Freq: Once | ORAL | Status: AC
  Administered 2018-08-03: 650 mg via ORAL

## 2018-08-03 MED ORDER — DIPHENHYDRAMINE HCL 25 MG PO CAPS
ORAL_CAPSULE | ORAL | Status: AC
  Filled 2018-08-03: qty 1

## 2018-08-03 MED ORDER — DIPHENHYDRAMINE HCL 25 MG PO CAPS
25.0000 mg | ORAL_CAPSULE | Freq: Once | ORAL | Status: AC
  Administered 2018-08-03: 25 mg via ORAL

## 2018-08-03 MED ORDER — ACETAMINOPHEN 325 MG PO TABS
ORAL_TABLET | ORAL | Status: AC
  Filled 2018-08-03: qty 2

## 2018-08-03 MED ORDER — TRASTUZUMAB CHEMO 150 MG IV SOLR
450.0000 mg | Freq: Once | INTRAVENOUS | Status: AC
  Administered 2018-08-03: 450 mg via INTRAVENOUS
  Filled 2018-08-03: qty 21.43

## 2018-08-03 MED ORDER — SODIUM CHLORIDE 0.9% FLUSH
10.0000 mL | INTRAVENOUS | Status: DC | PRN
  Administered 2018-08-03: 10 mL
  Filled 2018-08-03: qty 10

## 2018-08-03 MED ORDER — SODIUM CHLORIDE 0.9 % IV SOLN
Freq: Once | INTRAVENOUS | Status: AC
  Administered 2018-08-03: 10:00:00 via INTRAVENOUS
  Filled 2018-08-03: qty 250

## 2018-08-03 MED ORDER — HEPARIN SOD (PORK) LOCK FLUSH 100 UNIT/ML IV SOLN
500.0000 [IU] | Freq: Once | INTRAVENOUS | Status: AC | PRN
  Administered 2018-08-03: 500 [IU]
  Filled 2018-08-03: qty 5

## 2018-08-03 NOTE — Telephone Encounter (Signed)
Gave avs and calendar ° °

## 2018-08-03 NOTE — Patient Instructions (Signed)
[facility] Discharge Instructions for Patients Receiving Chemotherapy  Today you received the following chemotherapy agents:  Herceptin (trastuzmab)   To help prevent nausea and vomiting after your treatment, we encourage you to take your nausea medication as prescribed.   If you develop nausea and vomiting that is not controlled by your nausea medication, call the clinic.   BELOW ARE SYMPTOMS THAT SHOULD BE REPORTED IMMEDIATELY:  *FEVER GREATER THAN 100.5 F  *CHILLS WITH OR WITHOUT FEVER  NAUSEA AND VOMITING THAT IS NOT CONTROLLED WITH YOUR NAUSEA MEDICATION  *UNUSUAL SHORTNESS OF BREATH  *UNUSUAL BRUISING OR BLEEDING  TENDERNESS IN MOUTH AND THROAT WITH OR WITHOUT PRESENCE OF ULCERS  *URINARY PROBLEMS  *BOWEL PROBLEMS  UNUSUAL RASH Items with * indicate a potential emergency and should be followed up as soon as possible.  Feel free to call the clinic should you have any questions or concerns. The clinic phone number is [phone].  Please show the [facility] at check-in to the Emergency Department and triage nurse.

## 2018-08-09 ENCOUNTER — Other Ambulatory Visit (HOSPITAL_COMMUNITY): Payer: Self-pay

## 2018-08-09 MED ORDER — ALIROCUMAB 75 MG/ML ~~LOC~~ SOPN: 75.0000 mg | PEN_INJECTOR | SUBCUTANEOUS | 0 refills | Status: DC

## 2018-08-24 ENCOUNTER — Inpatient Hospital Stay: Payer: 59

## 2018-08-24 VITALS — BP 125/78 | HR 73 | Temp 98.4°F | Resp 18

## 2018-08-24 DIAGNOSIS — C7951 Secondary malignant neoplasm of bone: Secondary | ICD-10-CM

## 2018-08-24 DIAGNOSIS — Z5112 Encounter for antineoplastic immunotherapy: Secondary | ICD-10-CM | POA: Diagnosis not present

## 2018-08-24 DIAGNOSIS — C50411 Malignant neoplasm of upper-outer quadrant of right female breast: Secondary | ICD-10-CM

## 2018-08-24 DIAGNOSIS — Z170 Estrogen receptor positive status [ER+]: Secondary | ICD-10-CM

## 2018-08-24 LAB — COMPREHENSIVE METABOLIC PANEL
ALBUMIN: 3.4 g/dL — AB (ref 3.5–5.0)
ALT: 11 U/L (ref 0–44)
ANION GAP: 10 (ref 5–15)
AST: 15 U/L (ref 15–41)
Alkaline Phosphatase: 89 U/L (ref 38–126)
BUN: 16 mg/dL (ref 8–23)
CO2: 27 mmol/L (ref 22–32)
Calcium: 9.8 mg/dL (ref 8.9–10.3)
Chloride: 105 mmol/L (ref 98–111)
Creatinine, Ser: 0.94 mg/dL (ref 0.44–1.00)
GFR calc Af Amer: >60 mL/min (ref >60)
GFR calc non Af Amer: >60 mL/min (ref >60)
GLUCOSE: 91 mg/dL (ref 70–99)
POTASSIUM: 3.7 mmol/L (ref 3.5–5.1)
SODIUM: 142 mmol/L (ref 135–145)
Total Bilirubin: <0.2 mg/dL — ABNORMAL LOW (ref 0.3–1.2)
Total Protein: 6.9 g/dL (ref 6.5–8.1)

## 2018-08-24 LAB — CBC WITH DIFFERENTIAL/PLATELET
Basophils Absolute: 0 10*3/uL (ref 0.0–0.1)
Basophils Relative: 1 %
Eosinophils Absolute: 0.2 10*3/uL (ref 0.0–0.5)
Eosinophils Relative: 5 %
HCT: 33.9 % — ABNORMAL LOW (ref 34.8–46.6)
Hemoglobin: 11 g/dL — ABNORMAL LOW (ref 11.6–15.9)
LYMPHS PCT: 28 %
Lymphs Abs: 1 10*3/uL (ref 0.9–3.3)
MCH: 30.2 pg (ref 25.1–34.0)
MCHC: 32.4 g/dL (ref 31.5–36.0)
MCV: 93.1 fL (ref 79.5–101.0)
MONO ABS: 0.4 10*3/uL (ref 0.1–0.9)
MONOS PCT: 11 %
NEUTROS ABS: 2 10*3/uL (ref 1.5–6.5)
Neutrophils Relative %: 55 %
Platelets: 304 10*3/uL (ref 145–400)
RBC: 3.64 MIL/uL — ABNORMAL LOW (ref 3.70–5.45)
RDW: 18.7 % — AB (ref 11.2–14.5)
WBC: 3.6 10*3/uL — ABNORMAL LOW (ref 3.9–10.3)

## 2018-08-24 MED ORDER — HEPARIN SOD (PORK) LOCK FLUSH 100 UNIT/ML IV SOLN
500.0000 [IU] | Freq: Once | INTRAVENOUS | Status: AC | PRN
  Administered 2018-08-24: 500 [IU]
  Filled 2018-08-24: qty 5

## 2018-08-24 MED ORDER — SODIUM CHLORIDE 0.9 % IV SOLN
Freq: Once | INTRAVENOUS | Status: AC
  Administered 2018-08-24: 09:00:00 via INTRAVENOUS
  Filled 2018-08-24: qty 250

## 2018-08-24 MED ORDER — ACETAMINOPHEN 325 MG PO TABS
650.0000 mg | ORAL_TABLET | Freq: Once | ORAL | Status: AC
  Administered 2018-08-24: 650 mg via ORAL

## 2018-08-24 MED ORDER — DIPHENHYDRAMINE HCL 25 MG PO CAPS
25.0000 mg | ORAL_CAPSULE | Freq: Once | ORAL | Status: AC
  Administered 2018-08-24: 25 mg via ORAL

## 2018-08-24 MED ORDER — ACETAMINOPHEN 325 MG PO TABS
ORAL_TABLET | ORAL | Status: AC
  Filled 2018-08-24: qty 2

## 2018-08-24 MED ORDER — TRASTUZUMAB CHEMO 150 MG IV SOLR
450.0000 mg | Freq: Once | INTRAVENOUS | Status: AC
  Administered 2018-08-24: 450 mg via INTRAVENOUS
  Filled 2018-08-24: qty 21.4

## 2018-08-24 MED ORDER — DIPHENHYDRAMINE HCL 25 MG PO CAPS
ORAL_CAPSULE | ORAL | Status: AC
  Filled 2018-08-24: qty 1

## 2018-08-24 MED ORDER — SODIUM CHLORIDE 0.9% FLUSH
10.0000 mL | INTRAVENOUS | Status: DC | PRN
  Administered 2018-08-24: 10 mL
  Filled 2018-08-24: qty 10

## 2018-08-24 NOTE — Patient Instructions (Signed)
[facility] Discharge Instructions for Patients Receiving Chemotherapy  Today you received the following chemotherapy agents:  Herceptin (trastuzmab)   To help prevent nausea and vomiting after your treatment, we encourage you to take your nausea medication as prescribed.   If you develop nausea and vomiting that is not controlled by your nausea medication, call the clinic.   BELOW ARE SYMPTOMS THAT SHOULD BE REPORTED IMMEDIATELY:  *FEVER GREATER THAN 100.5 F  *CHILLS WITH OR WITHOUT FEVER  NAUSEA AND VOMITING THAT IS NOT CONTROLLED WITH YOUR NAUSEA MEDICATION  *UNUSUAL SHORTNESS OF BREATH  *UNUSUAL BRUISING OR BLEEDING  TENDERNESS IN MOUTH AND THROAT WITH OR WITHOUT PRESENCE OF ULCERS  *URINARY PROBLEMS  *BOWEL PROBLEMS  UNUSUAL RASH Items with * indicate a potential emergency and should be followed up as soon as possible.  Feel free to call the clinic should you have any questions or concerns. The clinic phone number is [phone].  Please show the [facility] at check-in to the Emergency Department and triage nurse.

## 2018-08-25 LAB — CANCER ANTIGEN 27.29: CAN 27.29: 29.9 U/mL (ref 0.0–38.6)

## 2018-09-02 ENCOUNTER — Telehealth: Payer: Self-pay | Admitting: *Deleted

## 2018-09-02 DIAGNOSIS — E876 Hypokalemia: Secondary | ICD-10-CM

## 2018-09-02 MED ORDER — POTASSIUM CHLORIDE ER 10 MEQ PO TBCR: 20.0000 meq | EXTENDED_RELEASE_TABLET | Freq: Every day | ORAL | 0 refills | Status: DC

## 2018-09-06 ENCOUNTER — Encounter (HOSPITAL_COMMUNITY): Payer: Self-pay | Admitting: Cardiology

## 2018-09-06 ENCOUNTER — Ambulatory Visit (HOSPITAL_COMMUNITY)
Admission: RE | Admit: 2018-09-06 | Discharge: 2018-09-06 | Disposition: A | Discharge status: Home / Self Care | Payer: 59 | Source: Ambulatory Visit | Attending: Cardiology | Admitting: Cardiology

## 2018-09-06 ENCOUNTER — Other Ambulatory Visit: Payer: Self-pay

## 2018-09-06 ENCOUNTER — Ambulatory Visit (HOSPITAL_BASED_OUTPATIENT_CLINIC_OR_DEPARTMENT_OTHER)
Admission: RE | Admit: 2018-09-06 | Discharge: 2018-09-06 | Disposition: A | Discharge status: Home / Self Care | Payer: 59 | Source: Ambulatory Visit | Attending: Cardiology | Admitting: Cardiology

## 2018-09-06 VITALS — BP 125/90 | HR 71 | Wt 158.4 lb

## 2018-09-06 DIAGNOSIS — E785 Hyperlipidemia, unspecified: Secondary | ICD-10-CM | POA: Diagnosis not present

## 2018-09-06 DIAGNOSIS — I10 Essential (primary) hypertension: Secondary | ICD-10-CM | POA: Diagnosis not present

## 2018-09-06 DIAGNOSIS — F17210 Nicotine dependence, cigarettes, uncomplicated: Secondary | ICD-10-CM | POA: Diagnosis not present

## 2018-09-06 DIAGNOSIS — J45909 Unspecified asthma, uncomplicated: Secondary | ICD-10-CM | POA: Insufficient documentation

## 2018-09-06 DIAGNOSIS — Z79899 Other long term (current) drug therapy: Secondary | ICD-10-CM | POA: Insufficient documentation

## 2018-09-06 DIAGNOSIS — E039 Hypothyroidism, unspecified: Secondary | ICD-10-CM | POA: Diagnosis not present

## 2018-09-06 DIAGNOSIS — Z170 Estrogen receptor positive status [ER+]: Secondary | ICD-10-CM | POA: Insufficient documentation

## 2018-09-06 DIAGNOSIS — Z9049 Acquired absence of other specified parts of digestive tract: Secondary | ICD-10-CM | POA: Insufficient documentation

## 2018-09-06 DIAGNOSIS — C50411 Malignant neoplasm of upper-outer quadrant of right female breast: Secondary | ICD-10-CM | POA: Diagnosis present

## 2018-09-06 DIAGNOSIS — I080 Rheumatic disorders of both mitral and aortic valves: Secondary | ICD-10-CM | POA: Insufficient documentation

## 2018-09-06 MED ORDER — NITROGLYCERIN 0.4 MG SL SUBL: 0.4000 mg | SUBLINGUAL_TABLET | SUBLINGUAL | 3 refills | Status: DC | PRN

## 2018-09-06 MED ORDER — NITROGLYCERIN 0.4 MG SL SUBL: 0.4000 mg | SUBLINGUAL_TABLET | SUBLINGUAL | 3 refills | Status: AC | PRN

## 2018-09-06 NOTE — Progress Notes (Signed)
Oncology: Dr. Jana Hakim  67 y.o. with history of HTN, asthma, and hyperlidemia was referred by Dr. Jana Hakim for cardio-oncology evaluation.  She was diagnosed with breast cancer on the right in 9/18. ER+/PR-.  Lymph node was HER2+, breast biopsy was HER2-.  She completed neoadjuvant chemotherapy with carboplatin, docetaxel, trastuzumab, pertuzumab x 6 cycles and now is on trastuzumab alone with plan for indefinite treatment.      She returns for cardio-oncology followup.  BP is controlled, no lightheadedness.  She is still smoking.  No significant exertional dyspnea currently.  No chest pain.    Labs (3/19): LDL 60 Labs (6/19): K 3.3, creatinine 1.05  PMH: 1. Asthma: mild. 2. HTN 3. Hyperlipidemia 4. Hypothyroidism 5. Diverticulitis with h/o colectomy.  6. Breast cancer: On right, diagnosed 9/18.  ER+/PR-.  Lymph node was HER2+, breast biopsy was HER2-.  She is getting neoadjuvant chemotherapy with carboplatin, docetaxel, trastuzumab, pertuzumab x 6 cycles then trastuzumab indefinitely She will have surgery and radiation.  - Echo (9/18): EF 50-55%, restrictive diastolic function, mild AI, mild-moderate MR.  - Echo (12/18): EF 55-60% with mild focal basal septal hypertrophy, GLS -20.5%, normal RV size and systolic function, mild AI, moderate MR.  - Echo (3/19): EF 60-65%, mild LVH, moderate diastolic dysfunction, strain inaccurate, mild AI, moderate MR, normla RV size and systolic function.  - Echo (6/19): EF 60-65%, mild LVH, GLS -16.4%, moderate diastolic dysfunction, normal RV size and systolic function, moderate MR, mild AI.  - Echo (9/19): EF 55-60%, moderate diastolic dysfunction, moderate MR, mild AI, GLS -13.7% (but using a different machine).   Social History   Socioeconomic History  . Marital status: Divorced    Spouse name: Not on file  . Number of children: Not on file  . Years of education: Not on file  . Highest education level: Not on file  Occupational History  . Not on  file  Social Needs  . Financial resource strain: Not on file  . Food insecurity:    Worry: Not on file    Inability: Not on file  . Transportation needs:    Medical: Not on file    Non-medical: Not on file  Tobacco Use  . Smoking status: Current Some Day Smoker    Packs/day: 0.25    Years: 40.00    Pack years: 10.00    Types: Cigarettes  . Smokeless tobacco: Never Used  . Tobacco comment: she is smoking 2 or more daily, she plans to use nicotine patch.   Substance and Sexual Activity  . Alcohol use: Yes    Comment: occasional  . Drug use: No  . Sexual activity: Not on file  Lifestyle  . Physical activity:    Days per week: Not on file    Minutes per session: Not on file  . Stress: Not on file  Relationships  . Social connections:    Talks on phone: Not on file    Gets together: Not on file    Attends religious service: Not on file    Active member of club or organization: Not on file    Attends meetings of clubs or organizations: Not on file    Relationship status: Not on file  . Intimate partner violence:    Fear of current or ex partner: Not on file    Emotionally abused: Not on file    Physically abused: Not on file    Forced sexual activity: Not on file  Other Topics Concern  . Not on  file  Social History Narrative  . Not on file   Family History  Problem Relation Age of Onset  . Cancer Mother        rectal  . Hypertension Brother   . Hypertension Brother   . Asthma Son   . Asthma Brother    ROS: All systems reviewed and negative except as per HPI.   Current Outpatient Medications  Medication Sig Dispense Refill  . acetaminophen-codeine (TYLENOL #3) 300-30 MG tablet     . albuterol (VENTOLIN HFA) 108 (90 BASE) MCG/ACT inhaler Inhale 1-2 puffs into the lungs every 6 (six) hours as needed for wheezing or shortness of breath. (Patient taking differently: Inhale 2 puffs into the lungs every 6 (six) hours as needed for wheezing or shortness of breath. ) 1  Inhaler 6  . Alirocumab (PRALUENT) 75 MG/ML SOPN Inject 75 mg into the muscle every 14 (fourteen) days. 6 pen 0  . amLODipine (NORVASC) 10 MG tablet Take 10 mg by mouth daily.     Marland Kitchen anastrozole (ARIMIDEX) 1 MG tablet Take 1 tablet (1 mg total) by mouth daily. 90 tablet 4  . cetirizine (ZYRTEC) 10 MG tablet Take 10 mg by mouth daily as needed for allergies.   11  . ibuprofen (ADVIL,MOTRIN) 200 MG tablet Take 400 mg by mouth every 6 (six) hours as needed for headache or moderate pain.    Marland Kitchen levothyroxine (SYNTHROID, LEVOTHROID) 50 MCG tablet Take 50 mcg by mouth daily.    Marland Kitchen lidocaine-prilocaine (EMLA) cream Apply 1 application topically as needed (port access). 30 g 0  . losartan-hydrochlorothiazide (HYZAAR) 100-12.5 MG tablet Take 1 tablet by mouth daily.   3  . metoprolol succinate (TOPROL-XL) 50 MG 24 hr tablet Take 1 tablet (50 mg total) by mouth daily. 90 tablet 3  . mometasone-formoterol (DULERA) 100-5 MCG/ACT AERO Inhale 2 puffs into the lungs 2 (two) times daily. 3 Inhaler 0  . Naphazoline-Glycerin (REDNESS RELIEF OP) Place 1 drop into both eyes daily as needed (redness).    . nitroGLYCERIN (NITROSTAT) 0.4 MG SL tablet Place 1 tablet (0.4 mg total) under the tongue every 5 (five) minutes as needed for chest pain. 75 tablet 3  . potassium chloride (K-DUR) 10 MEQ tablet Take 2 tablets (20 mEq total) by mouth daily. 60 tablet 0  . traMADol (ULTRAM) 50 MG tablet Take 1 tablet (50 mg total) by mouth every 8 (eight) hours as needed (pain). 45 tablet 0   No current facility-administered medications for this encounter.    BP 125/90   Pulse 71   Wt 71.8 kg (158 lb 6 oz)   SpO2 100%   BMI 28.05 kg/m   General: NAD Neck: JVP 8 cm, no thyromegaly or thyroid nodule.  Lungs: Clear to auscultation bilaterally with normal respiratory effort. CV: Nondisplaced PMI.  Heart regular S1/S2, no S3/S4, 1/6 SEM RUSB.  No peripheral edema.  No carotid bruit.  Normal pedal pulses.  Abdomen: Soft, nontender, no  hepatosplenomegaly, no distention.  Skin: Intact without lesions or rashes.  Neurologic: Alert and oriented x 3.  Psych: Normal affect. Extremities: No clubbing or cyanosis.  HEENT: Normal.   Assessment/Plan: 1. Smoking: I encouraged her again to quit.  2. HTN: BP controlled on current regimen. - I recommended that she stop using Goody's powders and BC Powder for headaches.  Take Tylenol.  3. Hyperlipidemia: Good lipids in Praluent.  4. Breast cancer: Plan now is for indefinite Herceptin.  I reviewed today's echo, EF is stable,  strain less negative today but a different machine was used.     - Repeat echo in 3 months.  If this echo remains stable and she moves to Herceptin every 4 wks, I will stretch out echo interval to every 4 months.   Loralie Champagne 09/06/2018

## 2018-09-06 NOTE — Progress Notes (Signed)
  Echocardiogram 2D Echocardiogram has been performed.  Tracie [name] 09/06/2018, 9:02 AM

## 2018-09-06 NOTE — Patient Instructions (Signed)
Follow up and echo in 3 months.  

## 2018-09-14 ENCOUNTER — Inpatient Hospital Stay: Payer: 59

## 2018-09-14 ENCOUNTER — Telehealth: Payer: Self-pay | Admitting: Adult Health

## 2018-09-14 ENCOUNTER — Encounter: Payer: Self-pay | Admitting: Adult Health

## 2018-09-14 ENCOUNTER — Inpatient Hospital Stay: Payer: 59 | Attending: Oncology | Admitting: Adult Health

## 2018-09-14 VITALS — BP 112/63 | HR 73 | Temp 98.6°F | Resp 18 | Ht 63.0 in | Wt 161.7 lb

## 2018-09-14 DIAGNOSIS — C7951 Secondary malignant neoplasm of bone: Secondary | ICD-10-CM

## 2018-09-14 DIAGNOSIS — Z170 Estrogen receptor positive status [ER+]: Principal | ICD-10-CM

## 2018-09-14 DIAGNOSIS — C50411 Malignant neoplasm of upper-outer quadrant of right female breast: Secondary | ICD-10-CM

## 2018-09-14 DIAGNOSIS — I10 Essential (primary) hypertension: Secondary | ICD-10-CM | POA: Diagnosis not present

## 2018-09-14 DIAGNOSIS — Z5112 Encounter for antineoplastic immunotherapy: Secondary | ICD-10-CM | POA: Insufficient documentation

## 2018-09-14 DIAGNOSIS — Z79811 Long term (current) use of aromatase inhibitors: Secondary | ICD-10-CM

## 2018-09-14 DIAGNOSIS — Z800 Family history of malignant neoplasm of digestive organs: Secondary | ICD-10-CM

## 2018-09-14 LAB — COMPREHENSIVE METABOLIC PANEL
ALT: 9 U/L (ref 0–44)
AST: 11 U/L — ABNORMAL LOW (ref 15–41)
Albumin: 3.3 g/dL — ABNORMAL LOW (ref 3.5–5.0)
Alkaline Phosphatase: 87 U/L (ref 38–126)
Anion gap: 10 (ref 5–15)
BUN: 14 mg/dL (ref 8–23)
CHLORIDE: 107 mmol/L (ref 98–111)
CO2: 26 mmol/L (ref 22–32)
CREATININE: 1.06 mg/dL — AB (ref 0.44–1.00)
Calcium: 9.7 mg/dL (ref 8.9–10.3)
GFR, EST NON AFRICAN AMERICAN: 52 mL/min — AB (ref >60)
Glucose, Bld: 104 mg/dL — ABNORMAL HIGH (ref 70–99)
POTASSIUM: 3.8 mmol/L (ref 3.5–5.1)
SODIUM: 143 mmol/L (ref 135–145)
TOTAL PROTEIN: 6.8 g/dL (ref 6.5–8.1)
Total Bilirubin: <0.2 mg/dL — ABNORMAL LOW (ref 0.3–1.2)

## 2018-09-14 LAB — CBC WITH DIFFERENTIAL/PLATELET
BASOS ABS: 0 10*3/uL (ref 0.0–0.1)
Basophils Relative: 0 %
EOS ABS: 0.2 10*3/uL (ref 0.0–0.5)
EOS PCT: 6 %
HCT: 33.3 % — ABNORMAL LOW (ref 34.8–46.6)
Hemoglobin: 11 g/dL — ABNORMAL LOW (ref 11.6–15.9)
LYMPHS ABS: 0.9 10*3/uL (ref 0.9–3.3)
LYMPHS PCT: 27 %
MCH: 30.3 pg (ref 25.1–34.0)
MCHC: 33.1 g/dL (ref 31.5–36.0)
MCV: 91.6 fL (ref 79.5–101.0)
MONO ABS: 0.4 10*3/uL (ref 0.1–0.9)
Monocytes Relative: 11 %
Neutro Abs: 1.9 10*3/uL (ref 1.5–6.5)
Neutrophils Relative %: 56 %
PLATELETS: 295 10*3/uL (ref 145–400)
RBC: 3.64 MIL/uL — AB (ref 3.70–5.45)
RDW: 18.9 % — AB (ref 11.2–14.5)
WBC: 3.4 10*3/uL — AB (ref 3.9–10.3)

## 2018-09-14 MED ORDER — ACETAMINOPHEN 325 MG PO TABS
650.0000 mg | ORAL_TABLET | Freq: Once | ORAL | Status: AC
  Administered 2018-09-14: 650 mg via ORAL

## 2018-09-14 MED ORDER — SODIUM CHLORIDE 0.9% FLUSH
10.0000 mL | INTRAVENOUS | Status: DC | PRN
  Administered 2018-09-14: 10 mL
  Filled 2018-09-14: qty 10

## 2018-09-14 MED ORDER — DIPHENHYDRAMINE HCL 25 MG PO CAPS
25.0000 mg | ORAL_CAPSULE | Freq: Once | ORAL | Status: AC
  Administered 2018-09-14: 25 mg via ORAL

## 2018-09-14 MED ORDER — HEPARIN SOD (PORK) LOCK FLUSH 100 UNIT/ML IV SOLN
500.0000 [IU] | Freq: Once | INTRAVENOUS | Status: AC | PRN
  Administered 2018-09-14: 500 [IU]
  Filled 2018-09-14: qty 5

## 2018-09-14 MED ORDER — DIPHENHYDRAMINE HCL 25 MG PO CAPS
ORAL_CAPSULE | ORAL | Status: AC
  Filled 2018-09-14: qty 1

## 2018-09-14 MED ORDER — SODIUM CHLORIDE 0.9 % IV SOLN
Freq: Once | INTRAVENOUS | Status: AC
  Administered 2018-09-14: 11:00:00 via INTRAVENOUS
  Filled 2018-09-14: qty 250

## 2018-09-14 MED ORDER — ACETAMINOPHEN 325 MG PO TABS
ORAL_TABLET | ORAL | Status: AC
  Filled 2018-09-14: qty 2

## 2018-09-14 MED ORDER — TRASTUZUMAB CHEMO 150 MG IV SOLR
450.0000 mg | Freq: Once | INTRAVENOUS | Status: AC
  Administered 2018-09-14: 450 mg via INTRAVENOUS
  Filled 2018-09-14: qty 21.43

## 2018-09-14 NOTE — Progress Notes (Signed)
[place]  [phone] [phone]) [phone]     ID: ISSA LUSTER DOB: 1951-08-20  MR#: [id number]  XAJ#:[id number]  Patient Care Team: Bartholome Bill, MD as PCP - General (Family Medicine) [name], MD as Consulting Physician (General Surgery) Magrinat, [name], MD as Consulting Physician (Oncology) [name], MD as Attending Physician (Radiation Oncology) Mottinger, [place] DDS (Physical Therapy) [name], MD as Consulting Physician (Cardiology) Margarette Canada (Inactive) Margarette Canada (Inactive) as Consulting Physician (Dentistry) OTHER MD:  CHIEF COMPLAINT: Triple positive breast cancer  CURRENT TREATMENT: trastuzumab; anastrozole, (zolendronate)   HISTORY OF CURRENT ILLNESS: From the original intake note:  Tracie [name] noted a change in her right breast sometime in July 2018. She called the [facility] to report that she had found a "kernel" in her breast. She was advised to see her primary physician which she did. The patient was then set up for bilateral diagnostic mammography with tomography and right breast ultrasonography at [facility] 09/03/2017. The breast density was category I a. In the upper outer quadrant of the right breast there was a 2.1 cm high density mass, which was palpable. Also in the right breast more posteriorly there was a 1.5 cm high density mass which was felt to be a suspicious lymph node. Ultrasound of the right breast confirmed a 2.1 centimeter lobulated solid mass in the right breast upper outer quadrant 15.8 cm from the nipple in the 10:00 radiant. There was a 1.5 cm oval mass in the right axillary tail with a small rounded masses also suspicious for lymph node involvement. A total of 3 suspicious lymph nodes were identified.  On 09/03/2017 the patient underwent biopsy of the breast mass and the suspicious right axillary lymph node. The final pathology ([id number]) found invasive ductal carcinoma, grade  3, in both. Both tumors were estrogen receptor positive at 70-75%, and both were progesterone receptor negative. Both had an elevated proliferation marker at 90%. The mass in the breast was HER-2 negative, with a signals ratio of 1.25 and the number per cell 1.88. The lymph node mass however was HER-2 positive with a signals ratio of 2.57, and the number per cell 3.60.  The patient's subsequent history is as detailed below.  INTERVAL HISTORY: Tracie [name] returns today for follow-up and treatment of her triple positive breast cancer. She receives trastuzumab every 21 days, with a dose due today. Her most recent echo was on 09/06/2018 and demonstrated a LVEF of 55-60%.    She continues on anastrozole, with good tolerance. She denies any hot flashes, vaginal dryness.  She has occasional arthralgias, and these are manageable for her.    REVIEW OF SYSTEMS: Tracie [name] recently underwent dental surgery and is healing well.  She notes the last time she had a xray of the area it didn't appear to have healed.  She needs the bone in her mouth shaved, and says she cannot afford it at this point.    She notes that she has not been taking the Losartan HCT and wonders if we need to send in a refill.  Tracie [name] wants more information about her cancer and what type she has/had.  She denies any issues today with unusual headaches, vision changes, chest pain, palpitations, shortness of breath, cough, bowel bladder changes or any other issues.  A detailed ROS was otherwise non contributory.    PAST MEDICAL HISTORY: Past Medical History:  Diagnosis Date  . Angina pectoris ([place])    history of   .  Arthritis   . Asthma   . Cancer ([place])    right breast  . Constipation   . Diverticulitis   . History of radiation therapy 04/07/18- 05/11/18   50 Gy in 25 fractions to right breast and regional nodes with four fields (no boost)  . History of radiation therapy 04/21/2018   T8 spine, 18 Gy in 1 fraction for a total dose of 18 Gy.     Marland Kitchen HPV in female   . Hyperlipidemia   . Hypertension   . Hypothyroidism   . Wears dentures   . Wears glasses     PAST SURGICAL HISTORY: Past Surgical History:  Procedure Laterality Date  . BREAST LUMPECTOMY WITH RADIOACTIVE SEED AND SENTINEL LYMPH NODE BIOPSY Right 03/08/2018   Procedure: RIGHT BREAST RADIOACTIVE SEED GUIDED LUMPECTOMY WITH RIGHT AXILLARY RADIOACTIVE SEED TARGETED LYMPH NODE EXCISION AND SENTINEL LYMPH NODE BIOPSY, INJECT BLUE DYE RIGHT BREAST;  Surgeon: [name], MD;  Location: [place];  Service: General;  Laterality: Right;  . CESAREAN SECTION    . COLON SURGERY  2012   sigmoidectomy  . COLOSTOMY    . COLOSTOMY TAKEDOWN  10/06/11  . IR BONE TUMOR(S)RF ABLATION  11/02/2017  . IR KYPHO THORACIC WITH BONE BIOPSY  11/02/2017  . IR RADIOLOGIST EVAL & MGMT  10/07/2017  . IR RADIOLOGIST EVAL & MGMT  11/25/2017  . PORTACATH PLACEMENT N/A 09/25/2017   Procedure: INSERTION PORT-A-CATH WITH ULTRA SOUND ERAS PATHWAY;  Surgeon: [name], MD;  Location: [place];  Service: General;  Laterality: N/A;  . TONSILLECTOMY    . TUBAL LIGATION      FAMILY HISTORY Family History  Problem Relation Age of Onset  . Cancer Mother        rectal  . Hypertension Brother   . Hypertension Brother   . Asthma Son   . Asthma Brother    The patient has little information regarding her father. Her mother died at age 103 from a strange cancer--the patient does not know what it was, it may well have been cervical cancer from her description. The patient has one brother, no sisters. There is no history of breast or ovarian cancer in the family to the patient's knowledge   GYNECOLOGIC HISTORY:  No LMP recorded. Patient is postmenopausal.  menarche age 43, first live birth age 38 the patient is [place] P3. She stopped having periods at age 75. She never used oral contraceptives or hormone replacement.   SOCIAL HISTORY:  Works as a Scientist, product/process development. She is divorced. Currently her  son Tracie [name] lives with her. He is a Art gallery manager. The 2 other children are Tracie [name], who lives in [place] and works in the theater and media, and Tracie [name] lives in [place], and is vice president of a [employer]. The patient has 5 grandchildren. She is a Psychologist, forensic.    ADVANCED DIRECTIVES: Not in place    HEALTH MAINTENANCE: Social History   Tobacco Use  . Smoking status: Current Some Day Smoker    Packs/day: 0.25    Years: 40.00    Pack years: 10.00    Types: Cigarettes  . Smokeless tobacco: Never Used  . Tobacco comment: she is smoking 2 or more daily, she plans to use nicotine patch.   Substance Use Topics  . Alcohol use: Yes    Comment: occasional  . Drug use: No     Colonoscopy: September 2012   PAP:  Bone density:09/03/2017  Allergies  Allergen Reactions  . Fish Allergy Anaphylaxis and Swelling    Swelling of hands and feet., non shellfish allergy  . Other Anaphylaxis    Tree nuts   . Ace Inhibitors Other (See Comments) and Cough    Cold like symptoms   . Tylenol [Acetaminophen] Other (See Comments)    Headache     Current Outpatient Medications  Medication Sig Dispense Refill  . acetaminophen-codeine (TYLENOL #3) 300-30 MG tablet     . albuterol (VENTOLIN HFA) 108 (90 BASE) MCG/ACT inhaler Inhale 1-2 puffs into the lungs every 6 (six) hours as needed for wheezing or shortness of breath. (Patient taking differently: Inhale 2 puffs into the lungs every 6 (six) hours as needed for wheezing or shortness of breath. ) 1 Inhaler 6  . Alirocumab (PRALUENT) 75 MG/ML SOPN Inject 75 mg into the muscle every 14 (fourteen) days. 6 pen 0  . amLODipine (NORVASC) 10 MG tablet Take 10 mg by mouth daily.     Marland Kitchen anastrozole (ARIMIDEX) 1 MG tablet Take 1 tablet (1 mg total) by mouth daily. 90 tablet 4  . cetirizine (ZYRTEC) 10 MG tablet Take 10 mg by mouth daily as needed for allergies.   11  . ibuprofen (ADVIL,MOTRIN) 200 MG tablet Take 400 mg by mouth  every 6 (six) hours as needed for headache or moderate pain.    Marland Kitchen levothyroxine (SYNTHROID, LEVOTHROID) 50 MCG tablet Take 50 mcg by mouth daily.    Marland Kitchen lidocaine-prilocaine (EMLA) cream Apply 1 application topically as needed (port access). 30 g 0  . losartan-hydrochlorothiazide (HYZAAR) 100-12.5 MG tablet Take 1 tablet by mouth daily.   3  . metoprolol succinate (TOPROL-XL) 50 MG 24 hr tablet Take 1 tablet (50 mg total) by mouth daily. 90 tablet 3  . mometasone-formoterol (DULERA) 100-5 MCG/ACT AERO Inhale 2 puffs into the lungs 2 (two) times daily. 3 Inhaler 0  . Naphazoline-Glycerin (REDNESS RELIEF OP) Place 1 drop into both eyes daily as needed (redness).    . nitroGLYCERIN (NITROSTAT) 0.4 MG SL tablet Place 1 tablet (0.4 mg total) under the tongue every 5 (five) minutes as needed for chest pain. 75 tablet 3  . potassium chloride (K-DUR) 10 MEQ tablet Take 2 tablets (20 mEq total) by mouth daily. 60 tablet 0  . traMADol (ULTRAM) 50 MG tablet Take 1 tablet (50 mg total) by mouth every 8 (eight) hours as needed (pain). 45 tablet 0   No current facility-administered medications for this visit.     OBJECTIVE: Middle-aged African-American woman who appears well Vitals:   09/14/18 0909  BP: 112/63  Pulse: 73  Resp: 18  Temp: 98.6 F (37 C)  SpO2: 97%     Body mass index is 28.64 kg/m.   Wt Readings from Last 3 Encounters:  09/14/18 161 lb 11.2 oz (73.3 kg)  09/06/18 158 lb 6 oz (71.8 kg)  08/03/18 158 lb 12.8 oz (72 kg)   ECOG FS:1  GENERAL: Patient is a well appearing female in no acute distress HEENT:  Sclerae anicteric.  Oropharynx clear and moist. No ulcerations or evidence of oropharyngeal candidiasis. Neck is supple.  NODES:  No cervical, supraclavicular, or axillary lymphadenopathy palpated.  BREAST EXAM:  Right breast s/p lumpectomy, radiation changes are noted, no nodules or masses, left breast without nodules, masses, or any other concerns.   LUNGS:  Clear to auscultation  bilaterally.  No wheezes or rhonchi. HEART:  Regular rate and rhythm. No murmur appreciated. ABDOMEN:  Soft, nontender.  Positive, normoactive bowel sounds. No organomegaly palpated. MSK:  No focal spinal tenderness to palpation. Full range of motion bilaterally in the upper extremities. EXTREMITIES:  No peripheral edema.   SKIN:  Clear with no obvious rashes or skin changes. No nail dyscrasia. NEURO:  Nonfocal. Well oriented.  Appropriate affect.       LAB RESULTS:  CMP     Component Value Date/Time   NA 143 09/14/2018 0830   NA 138 12/24/2017 0752   K 3.8 09/14/2018 0830   K 3.3 (L) 12/24/2017 0752   CL 107 09/14/2018 0830   CO2 26 09/14/2018 0830   CO2 25 12/24/2017 0752   GLUCOSE 104 (H) 09/14/2018 0830   GLUCOSE 99 12/24/2017 0752   BUN 14 09/14/2018 0830   BUN 7.9 12/24/2017 0752   CREATININE 1.06 (H) 09/14/2018 0830   CREATININE 0.7 12/24/2017 0752   CALCIUM 9.7 09/14/2018 0830   CALCIUM 9.2 12/24/2017 0752   PROT 6.8 09/14/2018 0830   PROT 6.3 (L) 12/24/2017 0752   ALBUMIN 3.3 (L) 09/14/2018 0830   ALBUMIN 3.4 (L) 12/24/2017 0752   AST 11 (L) 09/14/2018 0830   AST 15 12/24/2017 0752   ALT 9 09/14/2018 0830   ALT 18 12/24/2017 0752   ALKPHOS 87 09/14/2018 0830   ALKPHOS 93 12/24/2017 0752   BILITOT <0.2 (L) 09/14/2018 0830   BILITOT <0.22 12/24/2017 0752   GFRNONAA 52 (L) 09/14/2018 0830   GFRAA >60 09/14/2018 0830    No results found for: TOTALPROTELP, ALBUMINELP, A1GS, A2GS, BETS, BETA2SER, GAMS, MSPIKE, SPEI  No results found for: Nils Pyle, [facility]  Lab Results  Component Value Date   WBC 3.4 (L) 09/14/2018   NEUTROABS 1.9 09/14/2018   HGB 11.0 (L) 09/14/2018   HCT 33.3 (L) 09/14/2018   MCV 91.6 09/14/2018   PLT 295 09/14/2018      Chemistry      Component Value Date/Time   NA 143 09/14/2018 0830   NA 138 12/24/2017 0752   K 3.8 09/14/2018 0830   K 3.3 (L) 12/24/2017 0752   CL 107 09/14/2018 0830   CO2 26 09/14/2018  0830   CO2 25 12/24/2017 0752   BUN 14 09/14/2018 0830   BUN 7.9 12/24/2017 0752   CREATININE 1.06 (H) 09/14/2018 0830   CREATININE 0.7 12/24/2017 0752      Component Value Date/Time   CALCIUM 9.7 09/14/2018 0830   CALCIUM 9.2 12/24/2017 0752   ALKPHOS 87 09/14/2018 0830   ALKPHOS 93 12/24/2017 0752   AST 11 (L) 09/14/2018 0830   AST 15 12/24/2017 0752   ALT 9 09/14/2018 0830   ALT 18 12/24/2017 0752   BILITOT <0.2 (L) 09/14/2018 0830   BILITOT <0.22 12/24/2017 0752       No results found for: LABCA2  No components found for: [id number]    Appointment on 09/14/2018  Component Date Value Ref Range Status  . WBC 09/14/2018 3.4* 3.9 - 10.3 K/uL Final  . RBC 09/14/2018 3.64* 3.70 - 5.45 MIL/uL Final  . Hemoglobin 09/14/2018 11.0* 11.6 - 15.9 g/dL Final  . HCT 09/14/2018 33.3* 34.8 - 46.6 % Final  . MCV 09/14/2018 91.6  79.5 - 101.0 fL Final  . MCH 09/14/2018 30.3  25.1 - 34.0 pg Final  . MCHC 09/14/2018 33.1  31.5 - 36.0 g/dL Final  . RDW 09/14/2018 18.9* 11.2 - 14.5 % Final  . Platelets 09/14/2018 295  145 - 400 K/uL Final  . Neutrophils Relative % 09/14/2018 56  %  Final  . Neutro Abs 09/14/2018 1.9  1.5 - 6.5 K/uL Final  . Lymphocytes Relative 09/14/2018 27  % Final  . Lymphs Abs 09/14/2018 0.9  0.9 - 3.3 K/uL Final  . Monocytes Relative 09/14/2018 11  % Final  . Monocytes Absolute 09/14/2018 0.4  0.1 - 0.9 K/uL Final  . Eosinophils Relative 09/14/2018 6  % Final  . Eosinophils Absolute 09/14/2018 0.2  0.0 - 0.5 K/uL Final  . Basophils Relative 09/14/2018 0  % Final  . Basophils Absolute 09/14/2018 0.0  0.0 - 0.1 K/uL Final   Performed at [facility], [place] [address] [place], [place] [address]  . Sodium 09/14/2018 143  135 - 145 mmol/L Final  . Potassium 09/14/2018 3.8  3.5 - 5.1 mmol/L Final  . Chloride 09/14/2018 107  98 - 111 mmol/L Final  . CO2 09/14/2018 26  22 - 32 mmol/L Final  . Glucose, Bld 09/14/2018 104* 70 - 99 mg/dL Final  .  BUN 09/14/2018 14  8 - 23 mg/dL Final  . Creatinine, Ser 09/14/2018 1.06* 0.44 - 1.00 mg/dL Final  . Calcium 09/14/2018 9.7  8.9 - 10.3 mg/dL Final  . Total Protein 09/14/2018 6.8  6.5 - 8.1 g/dL Final  . Albumin 09/14/2018 3.3* 3.5 - 5.0 g/dL Final  . AST 09/14/2018 11* 15 - 41 U/L Final  . ALT 09/14/2018 9  0 - 44 U/L Final  . Alkaline Phosphatase 09/14/2018 87  38 - 126 U/L Final  . Total Bilirubin 09/14/2018 <0.2* 0.3 - 1.2 mg/dL Final  . GFR calc non Af Amer 09/14/2018 52* >60 mL/min Final  . GFR calc Af Amer 09/14/2018 >60  >60 mL/min Final   Comment: (NOTE) The eGFR has been calculated using the CKD EPI equation. This calculation has not been validated in all clinical situations. eGFR's persistently <60 mL/min signify possible Chronic Kidney Disease.   Georgiann Hahn gap 09/14/2018 10  5 - 15 Final   Performed at [facility], [place] [address] [place], [place] [address]    (this displays the last labs from the last 3 days)  No results found for: TOTALPROTELP, ALBUMINELP, A1GS, A2GS, BETS, BETA2SER, GAMS, MSPIKE, SPEI (this displays SPEP labs)  No results found for: KPAFRELGTCHN, LAMBDASER, KAPLAMBRATIO (kappa/lambda light chains)  No results found for: HGBA, HGBA2QUANT, HGBFQUANT, HGBSQUAN (Hemoglobinopathy evaluation)   No results found for: LDH  No results found for: IRON, TIBC, IRONPCTSAT (Iron and TIBC)  No results found for: FERRITIN  Urinalysis    Component Value Date/Time   LABSPEC 1.020 02/10/2011 2058   PHURINE 6.0 02/10/2011 2058   HGBUR LARGE (A) 02/10/2011 2058   BILIRUBINUR MODERATE (A) 02/10/2011 2058   KETONESUR 15 (A) 02/10/2011 2058   PROTEINUR >=300 (A) 02/10/2011 2058   UROBILINOGEN >=8.0 02/10/2011 2058   NITRITE NEGATIVE 02/10/2011 2058   LEUKOCYTESUR  02/10/2011 2058    NEGATIVE Biochemical Testing Only. Please order routine urinalysis from main lab if confirmatory testing is needed.     STUDIES: No results  found.  ELIGIBLE FOR AVAILABLE RESEARCH PROTOCOL: no  ASSESSMENT: 67 y.o. [place] woman status post right breast upper outer quadrant and right axillary lymph node biopsy 09/03/2017, both positive for invasive ductal carcinoma, grade 3, estrogen receptor positive, progesterone receptor negative, with an MIB-1 of 90%, the lymph node being HER-2 positive, the breast mass HER-2 negative  (a) staging studies September 24, 2017 show a lytic lesion in T8, possible areas of concern at L1-L2  (  b) biopsy/kyphoplasty/osteocool of T8 lesion 11/02/2017 confirms metastatic adenocarcinoma, 20% estrogen receptor positive, estrogen receptor negative  (c) PET scan 11/30/2017 shows no visceral disease, minimal metabolic activity at T8, no other bone lesions  (1) neoadjuvant chemotherapy with carboplatin, docetaxel, trastuzumab, and pertuzumab started 10/01/2017, completed 6 cycles 01/26/2018  (a) Docetaxel dropped after three cycles and Gemcitabine/Carbo started on 12/15/2017.   (2) trastuzumab to continue indefinitely  (a) echocardiogram 11/38/2018 shows an ejection fraction in the 55-60% range  (b) echocardiogram 03/01/2018 shows an ejection fraction in the 60-65% range  (c) echocardiogram 06/04/2018 shows an EF of 60-65%  (d) Echocardiogram on 09/06/18 shows EF of 55-60%  (3) right lumpectomy and sentinel lymph node sampling 03/08/2018  Showed a pT1(mic) pN0 invasive ductal carcinoma, grade 2, with negative margins.  (4) adjuvant radiation  04-07-18 to 05-11-18                                              (a) SITE/DOSE:  Right Breast and Axilla, SCV nodes / 50 Gy in 25 fx                (b) radiation to her oligo metastatic disease at T8 (18 Gy) in 1 fraction, 04/21/2018  (5) anastrozole started 05/29/2018  (a) bone density test not yet done  (6) zolendronate to start NOV 2019, to be repeated every 12 weeks (delayed due to dental extractions/work)  PLAN:  Tracie [name] is doing well today.  I reviewed  with her breast cancer with her in detail and gave her info in her AVS about her triple positivity.  She will continue taking Anastrozole and will continue on Trastuzumab every 3 weeks until her appointment on 10/26/18.  At that time, I will go through a [place] with her which will give her more details about her breast cancer.  She will also transition to Trastuzumab every 4 weeks at that point.    We also reviewed her labs today which for the most part are stable.  I did note her kidney function is slightly elevated so I recommended increased fluid intake.  She agrees she could do better with drinking water. I recommended that if she has not been taking the Losartan HCT for the past few months, and she has remained normotensive, then she should stop taking the medication.  She has been normotensive in our office, and has had no issues with this. We reviewed this in detail.    Tracie [name] understands the above plan and is in agreement with this.  We will continue to hold the zolendronate until she has had a little bit more time to heal from her dental work, and will readdress at her 10/26/18 visit.  Tracie [name] knows to call for any questions or concerns prior to her next appointment with [place].    A total of (30) minutes of face-to-face time was spent with this patient with greater than 50% of that time in counseling and care-coordination.   Wilber Bihari, NP  09/14/18 9:48 AM Medical Oncology and Hematology [facility] [address] [place], [place] [address] Tel. [phone]    Fax. [phone]

## 2018-09-14 NOTE — Patient Instructions (Signed)
Your breast cancer was diagnosed as invasive ductal carcinoma, Estrogen positive, Progesterone positive, and HER-2 positive or triple positive breast cancer.

## 2018-09-14 NOTE — Telephone Encounter (Signed)
Gave avs and calendar ° °

## 2018-09-14 NOTE — Patient Instructions (Signed)
[facility] Discharge Instructions for Patients Receiving Chemotherapy  Today you received the following chemotherapy agents Herceptin  To help prevent nausea and vomiting after your treatment, we encourage you to take your nausea medication as directed   If you develop nausea and vomiting that is not controlled by your nausea medication, call the clinic.   BELOW ARE SYMPTOMS THAT SHOULD BE REPORTED IMMEDIATELY:  *FEVER GREATER THAN 100.5 F  *CHILLS WITH OR WITHOUT FEVER  NAUSEA AND VOMITING THAT IS NOT CONTROLLED WITH YOUR NAUSEA MEDICATION  *UNUSUAL SHORTNESS OF BREATH  *UNUSUAL BRUISING OR BLEEDING  TENDERNESS IN MOUTH AND THROAT WITH OR WITHOUT PRESENCE OF ULCERS  *URINARY PROBLEMS  *BOWEL PROBLEMS  UNUSUAL RASH Items with * indicate a potential emergency and should be followed up as soon as possible.  Feel free to call the clinic should you have any questions or concerns. The clinic phone number is [phone].  Please show the CHEMO ALERT CARD at check-in to the Emergency Department and triage nurse.   

## 2018-09-29 ENCOUNTER — Other Ambulatory Visit: Payer: Self-pay | Admitting: Oncology

## 2018-09-29 DIAGNOSIS — E876 Hypokalemia: Secondary | ICD-10-CM

## 2018-10-05 ENCOUNTER — Ambulatory Visit: Payer: 59 | Admitting: Adult Health

## 2018-10-05 ENCOUNTER — Inpatient Hospital Stay: Payer: 59

## 2018-10-05 ENCOUNTER — Inpatient Hospital Stay: Payer: 59 | Attending: Oncology

## 2018-10-05 ENCOUNTER — Other Ambulatory Visit: Payer: 59

## 2018-10-05 VITALS — BP 119/83 | HR 67 | Temp 98.6°F | Resp 18

## 2018-10-05 DIAGNOSIS — C50411 Malignant neoplasm of upper-outer quadrant of right female breast: Secondary | ICD-10-CM | POA: Diagnosis present

## 2018-10-05 DIAGNOSIS — Z170 Estrogen receptor positive status [ER+]: Secondary | ICD-10-CM

## 2018-10-05 DIAGNOSIS — Z5112 Encounter for antineoplastic immunotherapy: Secondary | ICD-10-CM | POA: Diagnosis not present

## 2018-10-05 LAB — COMPREHENSIVE METABOLIC PANEL
ALBUMIN: 3.4 g/dL — AB (ref 3.5–5.0)
ALK PHOS: 79 U/L (ref 38–126)
ALT: 11 U/L (ref 0–44)
ANION GAP: 8 (ref 5–15)
AST: 12 U/L — AB (ref 15–41)
BILIRUBIN TOTAL: 0.3 mg/dL (ref 0.3–1.2)
BUN: 10 mg/dL (ref 8–23)
CHLORIDE: 108 mmol/L (ref 98–111)
CO2: 26 mmol/L (ref 22–32)
Calcium: 9.4 mg/dL (ref 8.9–10.3)
Creatinine, Ser: 0.84 mg/dL (ref 0.44–1.00)
GFR calc Af Amer: >60 mL/min (ref >60)
GFR calc non Af Amer: >60 mL/min (ref >60)
GLUCOSE: 93 mg/dL (ref 70–99)
POTASSIUM: 3.7 mmol/L (ref 3.5–5.1)
Sodium: 142 mmol/L (ref 135–145)
TOTAL PROTEIN: 6.8 g/dL (ref 6.5–8.1)

## 2018-10-05 LAB — CBC WITH DIFFERENTIAL/PLATELET
Basophils Absolute: 0 10*3/uL (ref 0.0–0.1)
Basophils Relative: 0 %
Eosinophils Absolute: 0.1 10*3/uL (ref 0.0–0.5)
Eosinophils Relative: 5 %
HEMATOCRIT: 34.6 % — AB (ref 34.8–46.6)
HEMOGLOBIN: 11.3 g/dL — AB (ref 11.6–15.9)
LYMPHS PCT: 30 %
Lymphs Abs: 0.9 10*3/uL (ref 0.9–3.3)
MCH: 30.4 pg (ref 25.1–34.0)
MCHC: 32.7 g/dL (ref 31.5–36.0)
MCV: 93 fL (ref 79.5–101.0)
Monocytes Absolute: 0.4 10*3/uL (ref 0.1–0.9)
Monocytes Relative: 13 %
NEUTROS ABS: 1.5 10*3/uL (ref 1.5–6.5)
NEUTROS PCT: 52 %
Platelets: 259 10*3/uL (ref 145–400)
RBC: 3.72 MIL/uL (ref 3.70–5.45)
RDW: 18.7 % — ABNORMAL HIGH (ref 11.2–14.5)
WBC: 2.9 10*3/uL — ABNORMAL LOW (ref 3.9–10.3)
nRBC: 0 % (ref 0.0–0.2)

## 2018-10-05 MED ORDER — SODIUM CHLORIDE 0.9 % IV SOLN
Freq: Once | INTRAVENOUS | Status: AC
  Administered 2018-10-05: 11:00:00 via INTRAVENOUS
  Filled 2018-10-05: qty 250

## 2018-10-05 MED ORDER — ACETAMINOPHEN 325 MG PO TABS
650.0000 mg | ORAL_TABLET | Freq: Once | ORAL | Status: AC
  Administered 2018-10-05: 650 mg via ORAL

## 2018-10-05 MED ORDER — SODIUM CHLORIDE 0.9% FLUSH
10.0000 mL | INTRAVENOUS | Status: DC | PRN
  Administered 2018-10-05: 10 mL
  Filled 2018-10-05: qty 10

## 2018-10-05 MED ORDER — DIPHENHYDRAMINE HCL 25 MG PO CAPS
ORAL_CAPSULE | ORAL | Status: AC
  Filled 2018-10-05: qty 1

## 2018-10-05 MED ORDER — TRASTUZUMAB CHEMO 150 MG IV SOLR
450.0000 mg | Freq: Once | INTRAVENOUS | Status: AC
  Administered 2018-10-05: 450 mg via INTRAVENOUS
  Filled 2018-10-05: qty 21.43

## 2018-10-05 MED ORDER — ACETAMINOPHEN 325 MG PO TABS
ORAL_TABLET | ORAL | Status: AC
  Filled 2018-10-05: qty 2

## 2018-10-05 MED ORDER — HEPARIN SOD (PORK) LOCK FLUSH 100 UNIT/ML IV SOLN
500.0000 [IU] | Freq: Once | INTRAVENOUS | Status: AC | PRN
  Administered 2018-10-05: 500 [IU]
  Filled 2018-10-05: qty 5

## 2018-10-05 MED ORDER — DIPHENHYDRAMINE HCL 25 MG PO CAPS
25.0000 mg | ORAL_CAPSULE | Freq: Once | ORAL | Status: AC
  Administered 2018-10-05: 25 mg via ORAL

## 2018-10-05 MED ORDER — SODIUM CHLORIDE 0.9 % IV SOLN
Freq: Once | INTRAVENOUS | Status: AC
  Administered 2018-10-05: 10:00:00 via INTRAVENOUS
  Filled 2018-10-05: qty 250

## 2018-10-05 NOTE — Patient Instructions (Signed)
[facility] Discharge Instructions for Patients Receiving Chemotherapy  Today you received the following chemotherapy agents Herceptin  To help prevent nausea and vomiting after your treatment, we encourage you to take your nausea medication as directed   If you develop nausea and vomiting that is not controlled by your nausea medication, call the clinic.   BELOW ARE SYMPTOMS THAT SHOULD BE REPORTED IMMEDIATELY:  *FEVER GREATER THAN 100.5 F  *CHILLS WITH OR WITHOUT FEVER  NAUSEA AND VOMITING THAT IS NOT CONTROLLED WITH YOUR NAUSEA MEDICATION  *UNUSUAL SHORTNESS OF BREATH  *UNUSUAL BRUISING OR BLEEDING  TENDERNESS IN MOUTH AND THROAT WITH OR WITHOUT PRESENCE OF ULCERS  *URINARY PROBLEMS  *BOWEL PROBLEMS  UNUSUAL RASH Items with * indicate a potential emergency and should be followed up as soon as possible.  Feel free to call the clinic should you have any questions or concerns. The clinic phone number is [phone].  Please show the CHEMO ALERT CARD at check-in to the Emergency Department and triage nurse.   

## 2018-10-06 ENCOUNTER — Encounter (HOSPITAL_COMMUNITY): Payer: Self-pay

## 2018-10-11 ENCOUNTER — Other Ambulatory Visit (HOSPITAL_COMMUNITY): Payer: Self-pay | Admitting: *Deleted

## 2018-10-15 ENCOUNTER — Other Ambulatory Visit (HOSPITAL_COMMUNITY): Payer: Self-pay

## 2018-10-15 IMAGING — CT CT CHEST W/ CM
2 of 5 series · 12 of 36 positions shown, 15 images · IV contrast (APPLIED)
Comparison: Breast MR from 09/22/2017

CLINICAL DATA: Staging workup for right breast cancer diagnosed
August 2017 in the upper outer quadrant of the right breast with
metastatic axillary adenopathy.

EXAM:
CT CHEST, ABDOMEN, AND PELVIS WITH CONTRAST
TECHNIQUE: Multidetector CT imaging of the chest, abdomen and pelvis was
performed following the standard protocol during bolus
administration of intravenous contrast.
CONTRAST:  100mL [id number] IOPAMIDOL ([id number]) INJECTION 61%

[Series 2: cap with · axial · 0.86mm/px · z∈[-199,+281]mm · 9 of 121 slices shown, 12 images]
[im 13/121  mediastinal]
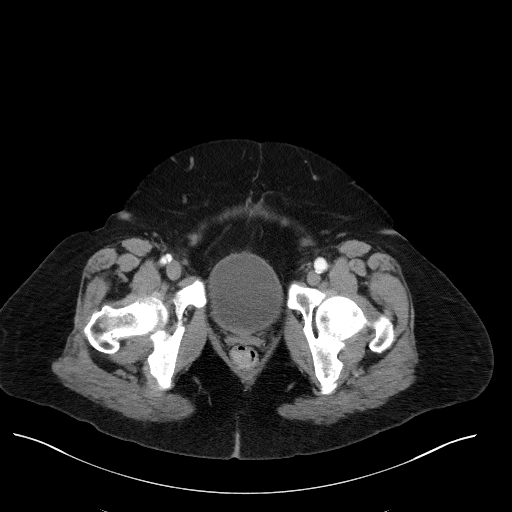
[im 13/121  lung]
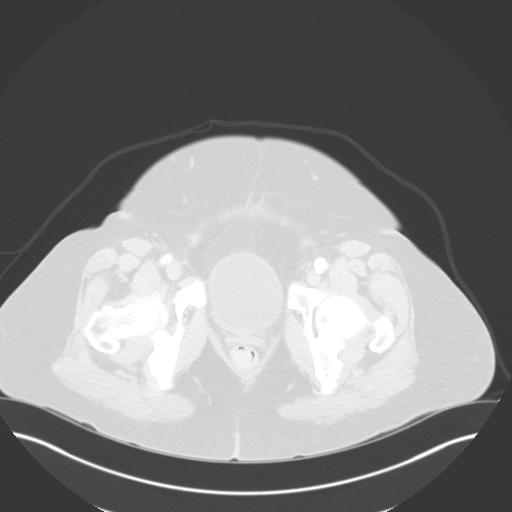
[im 25/121  lung]
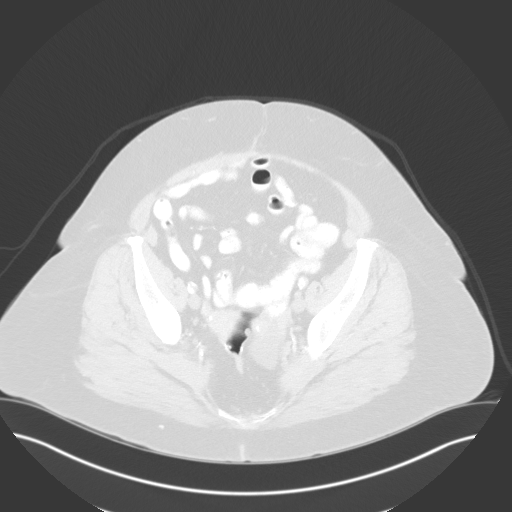
[im 37/121  lung]
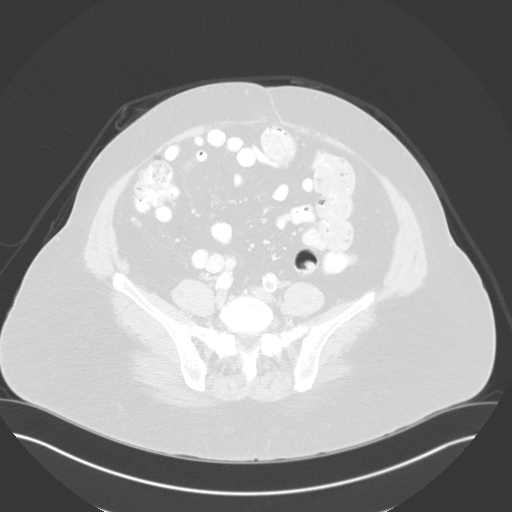
[im 49/121  lung]
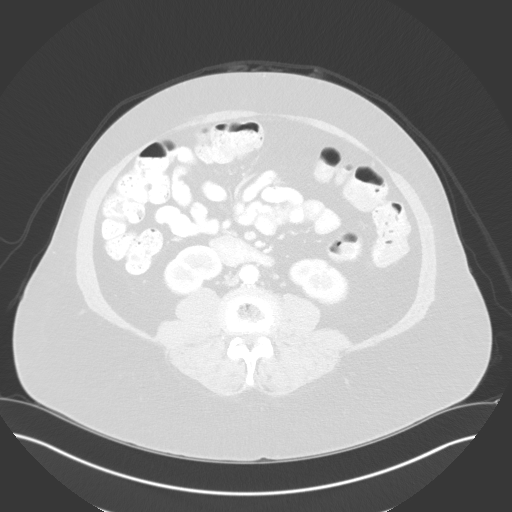
[im 61/121  mediastinal]
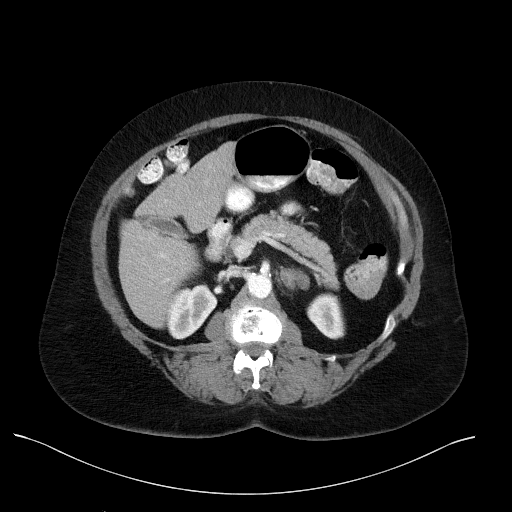
[im 61/121  lung]
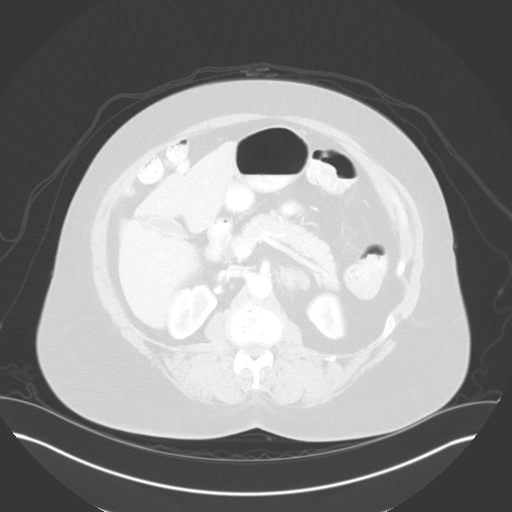
[im 73/121  lung]
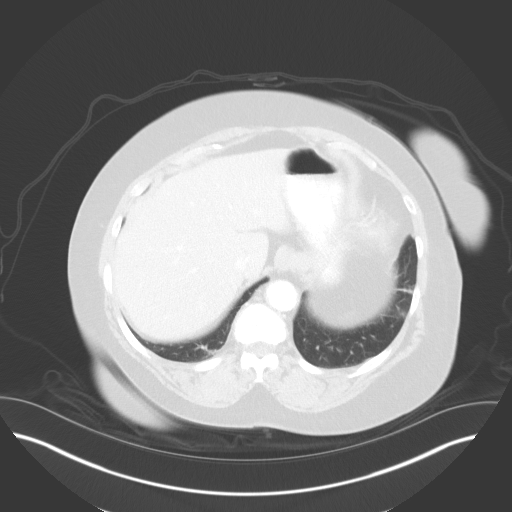
[im 85/121  lung]
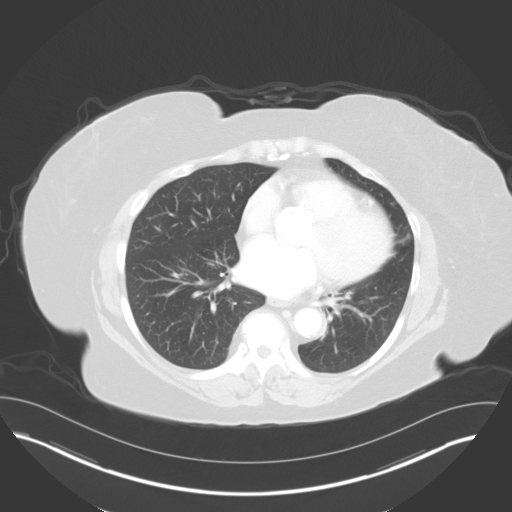
[im 97/121  lung]
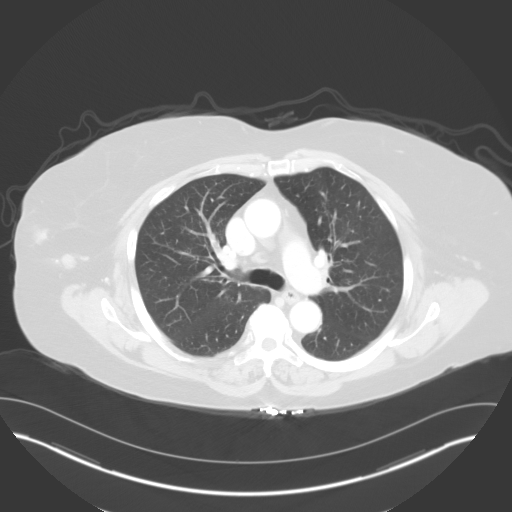
[im 109/121  mediastinal]
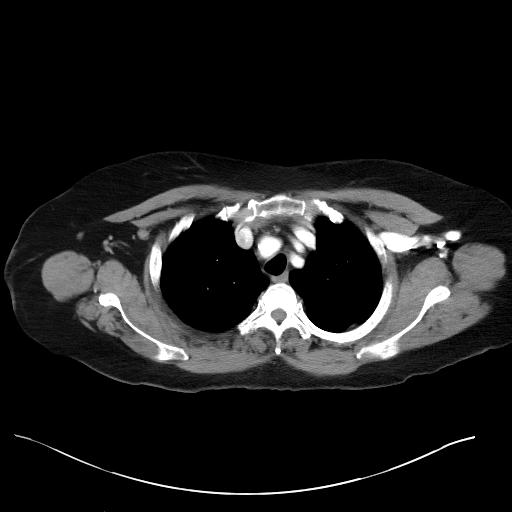
[im 109/121  lung]
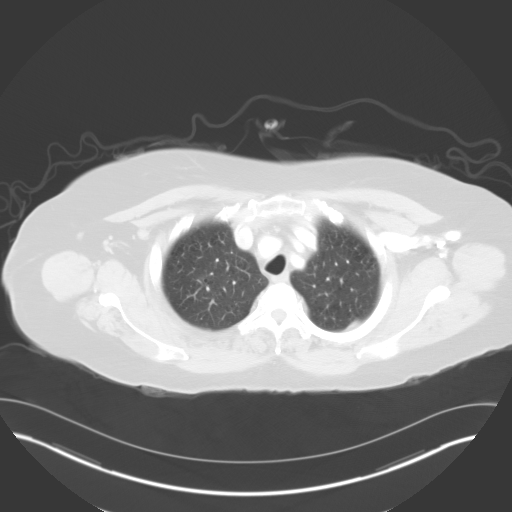

[Series 5: coronals · coronal · 0.82mm/px · 3 of 154 slices shown]
[im 31/154  lung]
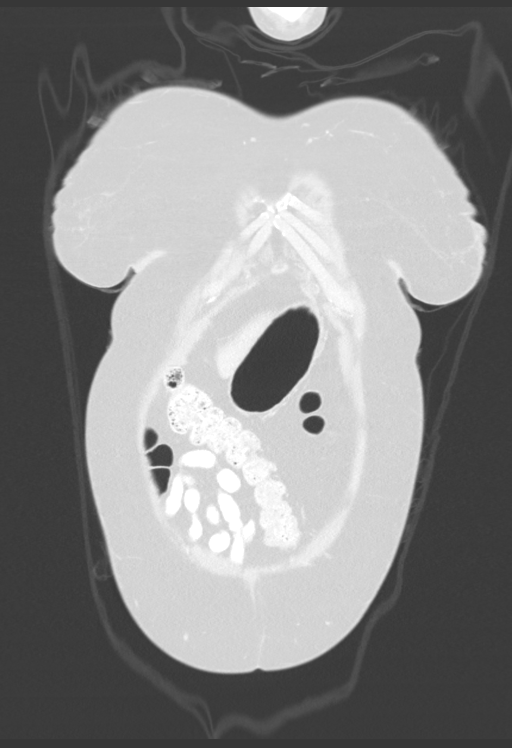
[im 62/154  lung]
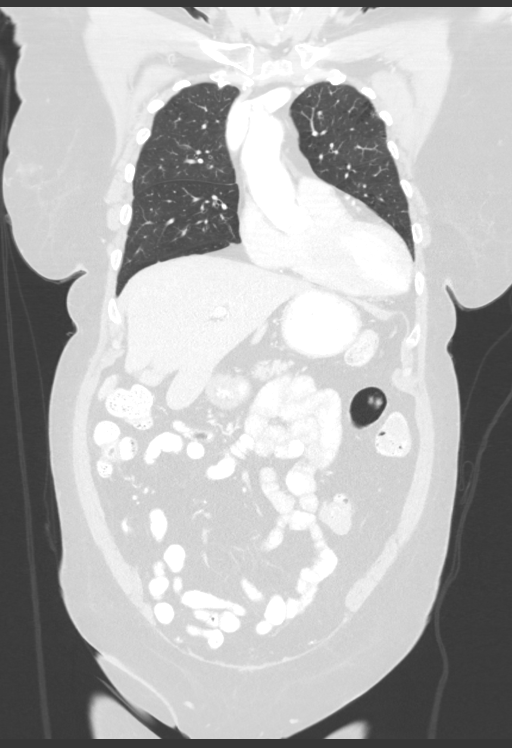
[im 92/154  lung]
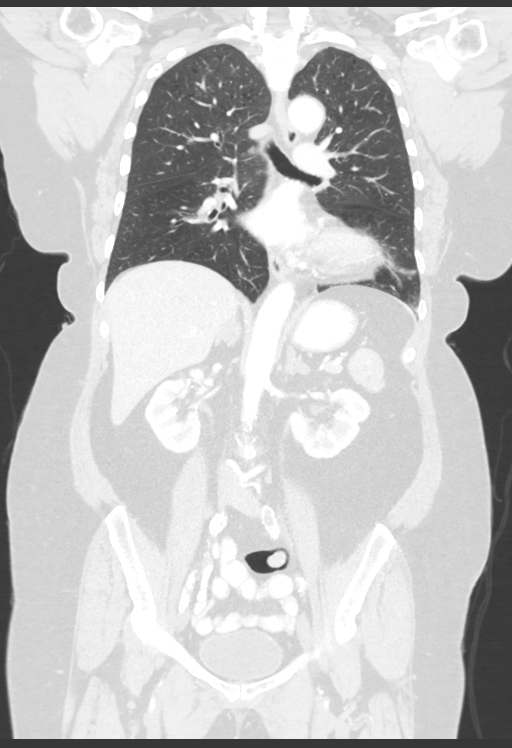

[12 of 36 positions shown; findings below may reference images not displayed]

FINDINGS: CT CHEST FINDINGS

Cardiovascular: Coronary, aortic arch, and branch vessel
atherosclerotic vascular disease.

Mediastinum/Nodes: Peripheral right breast mass on image [DATE]
measuring 2.5 by 1.7 cm. An adjacent axillary lymph node measures
1.2 cm in short axis on image [DATE]. There is a subpectoral lymph
node on the right measuring 0.9 cm on image [DATE] if, suspicious for
possible involvement. No other thoracic adenopathy is identified.

Lungs/Pleura: Centrilobular emphysema. Linear subsegmental
atelectasis or scarring anteriorly in the left lower lobe and
posteriorly in the right lower lobe.

Musculoskeletal: 1.8 by 2.2 2.2 cm lytic lesion of the T8 vertebral
body anteriorly and slightly eccentric to the left, with
questionable cortical breakthrough anteriorly. This is shown on
image 115/6. No other discrete lesion in the bony thorax is
identified.

CT ABDOMEN PELVIS FINDINGS

Hepatobiliary: Contracted gallbladder.  Otherwise unremarkable.

Pancreas: Unremarkable

Spleen: Unremarkable

Adrenals/Urinary Tract: Low-density fullness of both adrenal glands.
On the right side there is a discrete nodule of the medial limb
measuring 2.0 by 1.3 cm with portal venous phase density of 41
Hounsfield units, and delayed phase density of 12 Hounsfield units,
yielding a relative washout of 71% compatible with adenoma.

The kidneys appear unremarkable as does the urinary bladder.

Stomach/Bowel: Postoperative findings at the rectosigmoid junction.
Otherwise unremarkable.

Vascular/Lymphatic: Aortoiliac atherosclerotic vascular disease.
Small pelvic lymph nodes are not pathologically enlarged by size
criteria.

Reproductive: Slightly lobular uterine contour suspicious for small
fibroids. A 6.0 by 4.5 by 3.9 cm cystic lesion of the left
ovary/adnexa extending posteriorly is present.

Other: No supplemental non-categorized findings.

Musculoskeletal: Lumbar spondylosis and degenerative disc disease
with grade 1 degenerative retrolisthesis at L2- 3 and L3-4. A left
foraminal disc protrusion at L3-4 is thought to be causing left
foraminal stenosis. Endplate sclerosis at the L1- 2 level appears
degenerative.
IMPRESSION: 1. Right upper lateral breast cancer with a single right axillary
and a single right subpectoral lymph node which appear prominent,
favoring metastatic adenopathy.
2. 2.2 cm purely lytic lesion of the T8 vertebral body, possible
cortical breakthrough anteriorly, highly suspicious for a metastatic
lesion, consider thoracic spine MRI for further characterization.
3. A 6.0 cm cystic lesion of the left adnexa/ovary is present. The
patient had a large left hydrosalpinx shown on a prior ultrasound of
08/05/2004 and odds are this is the same lesion. However, this is
not known to a certainty and accordingly I would recommend pelvic
sonography to reassess this region.
4. Other imaging findings of potential clinical significance: Aortic
Atherosclerosis (E4AXZ-HTR.R) and Emphysema (E4AXZ-HZA.G). Coronary
atherosclerosis. Right adrenal adenoma. Low-density fullness of both
adrenal glands. Postoperative findings at the rectosigmoid junction.
Uterine fibroids. Lumbar spondylosis and degenerative disc disease
thought to be causing left foraminal impingement at L3-4.

## 2018-10-16 IMAGING — DX DG CHEST 1V PORT
1 series · 1 of 1 positions shown · non-contrast
Comparison: Chest CT 09/24/2017

CLINICAL DATA: Status post Port-A-Cath placement.

EXAM:
PORTABLE CHEST 1 VIEW

[chest ap]
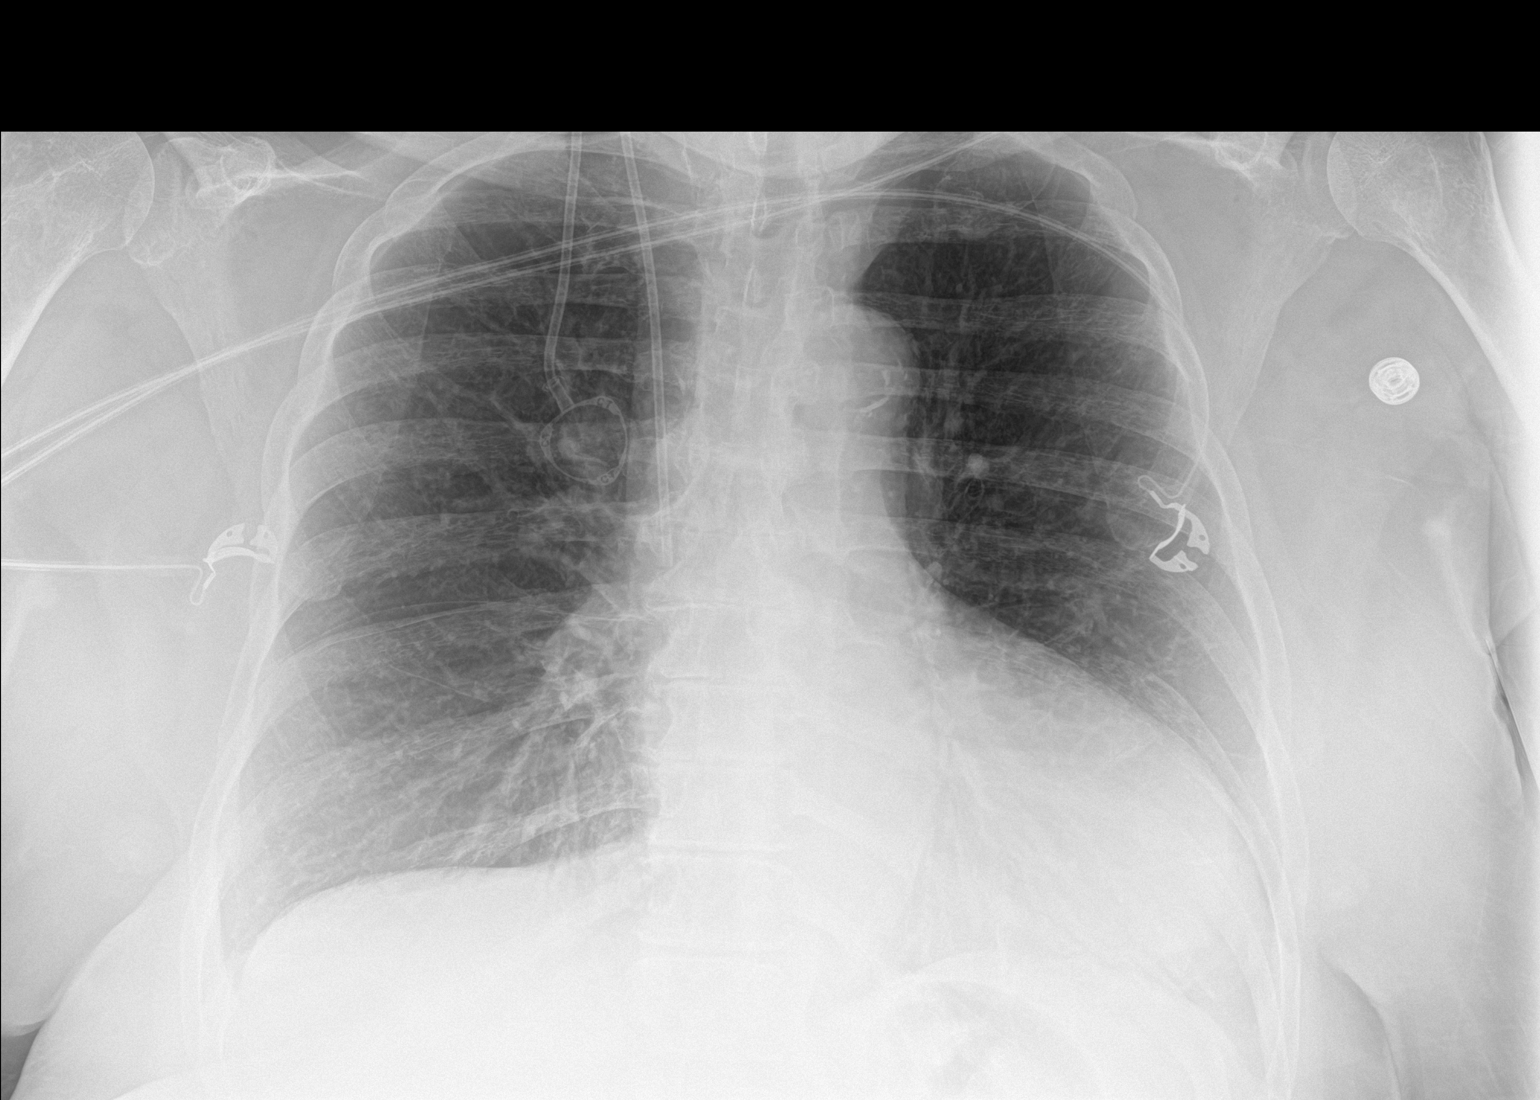

[1 of 1 positions shown; findings below may reference images not displayed]

FINDINGS: A right jugular Port-A-Cath has been placed and terminates over the
lower SVC. The cardiomediastinal silhouette is within normal limits.
Aortic atherosclerosis is noted. No airspace consolidation, edema,
pleural effusion, or pneumothorax is identified. Thoracic
spondylosis is noted.
IMPRESSION: Port-A-Cath as above. No evidence of pneumothorax or acute airspace
disease.

## 2018-10-22 ENCOUNTER — Telehealth: Payer: Self-pay

## 2018-10-22 NOTE — Telephone Encounter (Signed)
Spoke with patient to remind of SCP visit with NP on 10/26/18 at 9 am.  Patient said she will come to appt.

## 2018-10-22 NOTE — Progress Notes (Signed)
FMLA successfully faxed to [employer] at [phone]. Mailed copy to patient address on file. 

## 2018-10-26 ENCOUNTER — Inpatient Hospital Stay: Payer: 59

## 2018-10-26 ENCOUNTER — Encounter: Payer: Self-pay | Admitting: Adult Health

## 2018-10-26 ENCOUNTER — Inpatient Hospital Stay (HOSPITAL_BASED_OUTPATIENT_CLINIC_OR_DEPARTMENT_OTHER): Payer: 59 | Admitting: Adult Health

## 2018-10-26 ENCOUNTER — Telehealth: Payer: Self-pay | Admitting: Oncology

## 2018-10-26 VITALS — BP 117/69 | HR 73 | Temp 98.6°F | Resp 18 | Ht 63.0 in | Wt 158.8 lb

## 2018-10-26 DIAGNOSIS — F17210 Nicotine dependence, cigarettes, uncomplicated: Secondary | ICD-10-CM

## 2018-10-26 DIAGNOSIS — I890 Lymphedema, not elsewhere classified: Secondary | ICD-10-CM

## 2018-10-26 DIAGNOSIS — Z170 Estrogen receptor positive status [ER+]: Secondary | ICD-10-CM

## 2018-10-26 DIAGNOSIS — C50411 Malignant neoplasm of upper-outer quadrant of right female breast: Secondary | ICD-10-CM

## 2018-10-26 DIAGNOSIS — Z5112 Encounter for antineoplastic immunotherapy: Secondary | ICD-10-CM | POA: Diagnosis not present

## 2018-10-26 DIAGNOSIS — M2550 Pain in unspecified joint: Secondary | ICD-10-CM

## 2018-10-26 DIAGNOSIS — Z79811 Long term (current) use of aromatase inhibitors: Secondary | ICD-10-CM

## 2018-10-26 DIAGNOSIS — Z923 Personal history of irradiation: Secondary | ICD-10-CM

## 2018-10-26 DIAGNOSIS — Z800 Family history of malignant neoplasm of digestive organs: Secondary | ICD-10-CM

## 2018-10-26 DIAGNOSIS — Z933 Colostomy status: Secondary | ICD-10-CM

## 2018-10-26 LAB — CBC WITH DIFFERENTIAL/PLATELET
ABS IMMATURE GRANULOCYTES: 0.02 10*3/uL (ref 0.00–0.07)
BASOS PCT: 0 %
Basophils Absolute: 0 10*3/uL (ref 0.0–0.1)
EOS ABS: 0.1 10*3/uL (ref 0.0–0.5)
Eosinophils Relative: 4 %
HEMATOCRIT: 34.7 % — AB (ref 36.0–46.0)
Hemoglobin: 11.2 g/dL — ABNORMAL LOW (ref 12.0–15.0)
Immature Granulocytes: 1 %
Lymphocytes Relative: 29 %
Lymphs Abs: 1 10*3/uL (ref 0.7–4.0)
MCH: 29.7 pg (ref 26.0–34.0)
MCHC: 32.3 g/dL (ref 30.0–36.0)
MCV: 92 fL (ref 80.0–100.0)
MONO ABS: 0.4 10*3/uL (ref 0.1–1.0)
Monocytes Relative: 12 %
Neutro Abs: 1.8 10*3/uL (ref 1.7–7.7)
Neutrophils Relative %: 54 %
PLATELETS: 259 10*3/uL (ref 150–400)
RBC: 3.77 MIL/uL — ABNORMAL LOW (ref 3.87–5.11)
RDW: 18 % — AB (ref 11.5–15.5)
WBC: 3.3 10*3/uL — ABNORMAL LOW (ref 4.0–10.5)
nRBC: 0 % (ref 0.0–0.2)

## 2018-10-26 LAB — COMPREHENSIVE METABOLIC PANEL
ALK PHOS: 83 U/L (ref 38–126)
ALT: 11 U/L (ref 0–44)
AST: 12 U/L — AB (ref 15–41)
Albumin: 3.4 g/dL — ABNORMAL LOW (ref 3.5–5.0)
Anion gap: 9 (ref 5–15)
BUN: 10 mg/dL (ref 8–23)
CALCIUM: 9.5 mg/dL (ref 8.9–10.3)
CO2: 25 mmol/L (ref 22–32)
CREATININE: 0.88 mg/dL (ref 0.44–1.00)
Chloride: 108 mmol/L (ref 98–111)
GFR calc Af Amer: >60 mL/min (ref >60)
GFR calc non Af Amer: >60 mL/min (ref >60)
GLUCOSE: 96 mg/dL (ref 70–99)
Potassium: 3.5 mmol/L (ref 3.5–5.1)
Sodium: 142 mmol/L (ref 135–145)
TOTAL PROTEIN: 6.8 g/dL (ref 6.5–8.1)
Total Bilirubin: <0.2 mg/dL — ABNORMAL LOW (ref 0.3–1.2)

## 2018-10-26 MED ORDER — SODIUM CHLORIDE 0.9% FLUSH
10.0000 mL | INTRAVENOUS | Status: DC | PRN
  Administered 2018-10-26: 10 mL
  Filled 2018-10-26: qty 10

## 2018-10-26 MED ORDER — ACETAMINOPHEN 325 MG PO TABS
ORAL_TABLET | ORAL | Status: AC
  Filled 2018-10-26: qty 2

## 2018-10-26 MED ORDER — TRAMADOL HCL 50 MG PO TABS: 50.0000 mg | ORAL_TABLET | Freq: Two times a day (BID) | ORAL | 0 refills | Status: DC | PRN

## 2018-10-26 MED ORDER — TRASTUZUMAB CHEMO 150 MG IV SOLR
450.0000 mg | Freq: Once | INTRAVENOUS | Status: AC
  Administered 2018-10-26: 450 mg via INTRAVENOUS
  Filled 2018-10-26: qty 21.43

## 2018-10-26 MED ORDER — HEPARIN SOD (PORK) LOCK FLUSH 100 UNIT/ML IV SOLN
500.0000 [IU] | Freq: Once | INTRAVENOUS | Status: AC | PRN
  Administered 2018-10-26: 500 [IU]
  Filled 2018-10-26: qty 5

## 2018-10-26 MED ORDER — SODIUM CHLORIDE 0.9 % IV SOLN
Freq: Once | INTRAVENOUS | Status: AC
  Administered 2018-10-26: 10:00:00 via INTRAVENOUS
  Filled 2018-10-26: qty 250

## 2018-10-26 MED ORDER — DIPHENHYDRAMINE HCL 25 MG PO CAPS
ORAL_CAPSULE | ORAL | Status: AC
  Filled 2018-10-26: qty 1

## 2018-10-26 MED ORDER — ACETAMINOPHEN 325 MG PO TABS
650.0000 mg | ORAL_TABLET | Freq: Once | ORAL | Status: AC
  Administered 2018-10-26: 650 mg via ORAL

## 2018-10-26 MED ORDER — DIPHENHYDRAMINE HCL 25 MG PO CAPS
25.0000 mg | ORAL_CAPSULE | Freq: Once | ORAL | Status: AC
  Administered 2018-10-26: 25 mg via ORAL

## 2018-10-26 NOTE — Telephone Encounter (Signed)
Gave avs  ° °

## 2018-10-26 NOTE — Progress Notes (Signed)
CLINIC:  Survivorship   REASON FOR VISIT:  Routine follow-up post-treatment for a recent history of breast cancer.  BRIEF ONCOLOGIC HISTORY:    Malignant neoplasm of upper-outer quadrant of right breast in female, estrogen receptor positive ([place])   09/03/2017 Initial Biopsy    status post right breast upper outer quadrant and right axillary lymph node biopsy 09/03/2017, both positive for invasive ductal carcinoma, grade 3, estrogen receptor positive, progesterone receptor negative, with an MIB-1 of 90%, the lymph node being HER-2 positive, the breast mass HER-2 negative             (a) staging studies September 24, 2017 show a lytic lesion in T8, possible areas of concern at L1-L2             (b) biopsy/kyphoplasty/osteocool of T8 lesion 11/02/2017 confirms metastatic adenocarcinoma, 20% estrogen receptor positive, estrogen receptor negative             (c) PET scan 11/30/2017 shows no visceral disease, minimal metabolic activity at T8, no other bone lesions    09/08/2017 Initial Diagnosis    Malignant neoplasm of upper-outer quadrant of right breast in female, estrogen receptor positive ([place])    10/01/2017 - 01/26/2018 Neo-Adjuvant Chemotherapy    neoadjuvant chemotherapy with carboplatin, docetaxel, trastuzumab, and pertuzumab started 10/01/2017, completed 6 cycles 01/26/2018             (a) Docetaxel dropped after three cycles and Gemcitabine/Carbo started on 12/15/2017.     01/2018 -  Adjuvant Chemotherapy    trastuzumab to continue indefinitely             (a) echocardiogram 11/38/2018 shows an ejection fraction in the 55-60% range             (b) echocardiogram 03/01/2018 shows an ejection fraction in the 60-65% range             (c) echocardiogram 06/04/2018 shows an EF of 60-65%    03/08/2018 Surgery    right lumpectomy and sentinel lymph node sampling 03/08/2018  Showed a pT1(mic) pN0 invasive ductal carcinoma, grade 2, with negative margins.    04/07/2018 - 05/11/2018 Radiation  Therapy    adjuvant radiation 04-07-18 to 05-11-18            (a) SITE/DOSE:Right Breast and Axilla, SCV nodes / 50 Gy in 25 fx                         (b) radiation to her oligo metastatic disease at T8 (18 Gy) in 1 fraction, 04/24/2    05/2018 -  Anti-estrogen oral therapy    Anastrozole daily     INTERVAL HISTORY:  Tracie [name] presents to the [facility] today for our initial meeting to review her survivorship care plan detailing her treatment course for breast cancer, as well as monitoring long-term side effects of that treatment, education regarding health maintenance, screening, and overall wellness and health promotion.     Overall, Tracie [name] reports feeling quite well.  She is taking Anastrozole daily.  She has some mild arthralgias.  Occasionally she notes that it will increase and she takes [place] powders since it helps.  She would like a refill on Tramadol.  She notes continued fullness in her right breast.      REVIEW OF SYSTEMS:  Review of Systems  Constitutional: Positive for fatigue. Negative for appetite change, chills, fever and unexpected weight change.  HENT:   Negative for hearing loss,  lump/mass, sore throat and trouble swallowing.   Eyes: Negative for eye problems and icterus.  Respiratory: Negative for chest tightness, cough and shortness of breath.   Cardiovascular: Negative for chest pain, leg swelling and palpitations.  Gastrointestinal: Negative for abdominal distention, abdominal pain, constipation, diarrhea, nausea and vomiting.  Endocrine: Negative for hot flashes.  Musculoskeletal: Positive for arthralgias. Negative for back pain.  Skin: Negative for itching and rash.  Neurological: Negative for dizziness, extremity weakness, headaches and numbness.  Hematological: Negative for adenopathy. Does not bruise/bleed easily.  Psychiatric/Behavioral: Negative for depression. The patient is not  nervous/anxious.        ONCOLOGY TREATMENT TEAM:  1. Surgeon:  Dr. Dalbert Batman at [facility] 2. Medical Oncologist: Dr. Jana Hakim  3. Radiation Oncologist: Dr. Isidore Moos    PAST MEDICAL/SURGICAL HISTORY:  Past Medical History:  Diagnosis Date  . Angina pectoris ([place])    history of   . Arthritis   . Asthma   . Cancer ([place])    right breast  . Constipation   . Diverticulitis   . History of radiation therapy 04/07/18- 05/11/18   50 Gy in 25 fractions to right breast and regional nodes with four fields (no boost)  . History of radiation therapy 04/21/2018   T8 spine, 18 Gy in 1 fraction for a total dose of 18 Gy.   Marland Kitchen HPV in female   . Hyperlipidemia   . Hypertension   . Hypothyroidism   . Wears dentures   . Wears glasses    Past Surgical History:  Procedure Laterality Date  . BREAST LUMPECTOMY WITH RADIOACTIVE SEED AND SENTINEL LYMPH NODE BIOPSY Right 03/08/2018   Procedure: RIGHT BREAST RADIOACTIVE SEED GUIDED LUMPECTOMY WITH RIGHT AXILLARY RADIOACTIVE SEED TARGETED LYMPH NODE EXCISION AND SENTINEL LYMPH NODE BIOPSY, INJECT BLUE DYE RIGHT BREAST;  Surgeon: [name], MD;  Location: [facility];  Service: General;  Laterality: Right;  . CESAREAN SECTION    . COLON SURGERY  2012   sigmoidectomy  . COLOSTOMY    . COLOSTOMY TAKEDOWN  10/06/11  . IR BONE TUMOR(S)RF ABLATION  11/02/2017  . IR KYPHO THORACIC WITH BONE BIOPSY  11/02/2017  . IR RADIOLOGIST EVAL & MGMT  10/07/2017  . IR RADIOLOGIST EVAL & MGMT  11/25/2017  . PORTACATH PLACEMENT N/A 09/25/2017   Procedure: INSERTION PORT-A-CATH WITH ULTRA SOUND ERAS PATHWAY;  Surgeon: [name], MD;  Location: [facility];  Service: General;  Laterality: N/A;  . TONSILLECTOMY    . TUBAL LIGATION       ALLERGIES:  Allergies  Allergen Reactions  . Fish Allergy Anaphylaxis and Swelling    Swelling of hands and feet., non shellfish allergy  . Other Anaphylaxis    Tree nuts   . Ace Inhibitors Other (See Comments) and Cough     Cold like symptoms   . Tylenol [Acetaminophen] Other (See Comments)    Headache      CURRENT MEDICATIONS:  Outpatient Encounter Medications as of 10/26/2018  Medication Sig  . acetaminophen-codeine (TYLENOL #3) 300-30 MG tablet   . albuterol (PROAIR HFA) 108 (90 Base) MCG/ACT inhaler INHALE 2 PUFFS BY MOUTH INTO THE LUNGS EVERY 6 HOURS AS NEEDED FOR WHEEZING  . Alirocumab (PRALUENT) 75 MG/ML SOPN Inject 75 mg into the muscle every 14 (fourteen) days.  Marland Kitchen amLODipine (NORVASC) 10 MG tablet Take 10 mg by mouth daily.   Marland Kitchen anastrozole (ARIMIDEX) 1 MG tablet Take 1 tablet (1 mg total) by mouth daily.  . cetirizine (ZYRTEC) 10 MG  tablet Take 10 mg by mouth daily as needed for allergies.   Marland Kitchen ibuprofen (ADVIL,MOTRIN) 200 MG tablet Take 400 mg by mouth every 6 (six) hours as needed for headache or moderate pain.  Marland Kitchen levothyroxine (SYNTHROID, LEVOTHROID) 50 MCG tablet Take 50 mcg by mouth daily.  Marland Kitchen lidocaine-prilocaine (EMLA) cream Apply 1 application topically as needed (port access).  Marland Kitchen losartan-hydrochlorothiazide (HYZAAR) 100-12.5 MG tablet Take 1 tablet by mouth daily.   . metoprolol succinate (TOPROL-XL) 50 MG 24 hr tablet Take 1 tablet (50 mg total) by mouth daily.  . mometasone-formoterol (DULERA) 100-5 MCG/ACT AERO Inhale 2 puffs into the lungs 2 (two) times daily.  . Naphazoline-Glycerin (REDNESS RELIEF OP) Place 1 drop into both eyes daily as needed (redness).  . nitroGLYCERIN (NITROSTAT) 0.4 MG SL tablet Place 1 tablet (0.4 mg total) under the tongue every 5 (five) minutes as needed for chest pain.  . potassium chloride (K-DUR) 10 MEQ tablet TAKE 2 TABLETS(20 MEQ) BY MOUTH DAILY  . traMADol (ULTRAM) 50 MG tablet Take 1 tablet (50 mg total) by mouth every 8 (eight) hours as needed (pain).  Marland Kitchen albuterol (VENTOLIN HFA) 108 (90 BASE) MCG/ACT inhaler Inhale 1-2 puffs into the lungs every 6 (six) hours as needed for wheezing or shortness of breath. (Patient not taking: Reported on 10/26/2018)  .  [DISCONTINUED] prochlorperazine (COMPAZINE) 10 MG tablet Take 1 tablet (10 mg total) by mouth every 6 (six) hours as needed (Nausea or vomiting).   No facility-administered encounter medications on file as of 10/26/2018.      ONCOLOGIC FAMILY HISTORY:  Family History  Problem Relation Age of Onset  . Cancer Mother        rectal  . Hypertension Brother   . Hypertension Brother   . Asthma Son   . Asthma Brother      GENETIC COUNSELING/TESTING: Not at this time  SOCIAL HISTORY:  Social History   Socioeconomic History  . Marital status: Divorced    Spouse name: Not on file  . Number of children: Not on file  . Years of education: Not on file  . Highest education level: Not on file  Occupational History  . Not on file  Social Needs  . Financial resource strain: Not on file  . Food insecurity:    Worry: Not on file    Inability: Not on file  . Transportation needs:    Medical: Not on file    Non-medical: Not on file  Tobacco Use  . Smoking status: Current Some Day Smoker    Packs/day: 0.25    Years: 40.00    Pack years: 10.00    Types: Cigarettes  . Smokeless tobacco: Never Used  . Tobacco comment: she is smoking 2 or more daily, she plans to use nicotine patch.   Substance and Sexual Activity  . Alcohol use: Yes    Comment: occasional  . Drug use: No  . Sexual activity: Not on file  Lifestyle  . Physical activity:    Days per week: Not on file    Minutes per session: Not on file  . Stress: Not on file  Relationships  . Social connections:    Talks on phone: Not on file    Gets together: Not on file    Attends religious service: Not on file    Active member of club or organization: Not on file    Attends meetings of clubs or organizations: Not on file    Relationship status: Not on file  .  Intimate partner violence:    Fear of current or ex partner: Not on file    Emotionally abused: Not on file    Physically abused: Not on file    Forced sexual  activity: Not on file  Other Topics Concern  . Not on file  Social History Narrative  . Not on file     PHYSICAL EXAMINATION:  Vital Signs:   Vitals:   10/26/18 0843  BP: 117/69  Pulse: 73  Resp: 18  Temp: 98.6 F (37 C)  SpO2: 99%   Filed Weights   10/26/18 0843  Weight: 158 lb 12.8 oz (72 kg)   General: Well-nourished, well-appearing female in no acute distress.  She is unaccompaniedtoday.   HEENT: Head is normocephalic.  Pupils equal and reactive to light. Conjunctivae clear without exudate.  Sclerae anicteric. Oral mucosa is pink, moist.  Oropharynx is pink without lesions or erythema.  Lymph: No cervical, supraclavicular, or infraclavicular lymphadenopathy noted on palpation.  Cardiovascular: Regular rate and rhythm.Marland Kitchen Respiratory: Clear to auscultation bilaterally. Chest expansion symmetric; breathing non-labored.  Breasts: right breast is swollen, no sign of recurrence, left breast benign GI: Abdomen soft and round; non-tender, non-distended. Bowel sounds normoactive.  GU: Deferred.  Neuro: No focal deficits. Steady gait.  Psych: Mood and affect normal and appropriate for situation.  Extremities: No edema. MSK: No focal spinal tenderness to palpation.  Full range of motion in bilateral upper extremities Skin: Warm and dry.  LABORATORY DATA:  None for this visit.  DIAGNOSTIC IMAGING:  None for this visit.      ASSESSMENT AND PLAN:  Tracie [name] is a pleasant 67 y.o. female with Stage IV right breast invasive ductal carcinoma, ER+/PR-/HER2+, diagnosed in 08/2017, treated with neoadjuvant chemotherapy, lumpectomy, adjuvant radiation therapy, maintenance Trastuzumab and anti-estrogen therapy with Anastrozole beginning in 05/2018.  She presents to the Survivorship Clinic for our initial meeting and routine follow-up post-completion of treatment for breast cancer.    1. Stage IV right breast cancer:  Tracie [name] is continuing to recover from definitive treatment for  breast cancer.   She will continue on Anastrozole daily.  She is tolerating this moderately well.  She will continue on Trastuzumab every 4 weeks and will undergo PET scan in December, with f/u with Dr. Jana Hakim in January.  Her echocardiogram is due in December as well.  This is scheduled.   Today, a comprehensive survivorship care plan and treatment summary was reviewed with the patient today detailing her breast cancer diagnosis, treatment course, potential late/long-term effects of treatment, appropriate follow-up care with recommendations for the future, and patient education resources.  A copy of this summary, along with a letter will be sent to the patient's primary care provider via mail/fax/In Basket message after today's visit.    2. Arthralgias: Patient to take tylenol or aleve if needed.  Recommended exercise and yoga to help.  There are occasions that she can take tramodol, she says this works the best and would like a refill.  I gave her #20 and she will use sparingly.    3. Brest Swelling: Recommended PT referral for breast lymphedema, however Tracie [name] declined this referral.  I reviewed massaging her breast with vitamin e oil and will write for compression bras for her to wear.  She will also undergo bilateral [facility]ogram next [facility] at [facility].    4. Bone health:  Given Tracie [name] age/history of breast cancer and her current treatment regimen including anti-estrogen therapy with Anastrozole, she is at risk  for bone demineralization.  She has reportedly undergone a DEXA scan, however I do not have these results.  She says she was supposed to start on bisphosphanate therapy, however this is on hold due to dental issues. She was given education on specific activities to promote bone health.  5. Cancer screening:  Due to Tracie [name]'s history and her age, she should receive screening for skin cancers, colon cancer, and gynecologic cancers.  The information and recommendations are listed on the  patient's comprehensive care plan/treatment summary and were reviewed in detail with the patient.    6. Health maintenance and wellness promotion: Tracie [name] was encouraged to consume 5-7 servings of fruits and vegetables per day. We reviewed the "Nutrition Rainbow" handout, as well as the handout "Take Control of Your Health and Reduce Your Cancer Risk" from the [place].  She was also encouraged to engage in moderate to vigorous exercise for 30 minutes per day most days of the week. We discussed the [employer] fitness program, which is designed for cancer survivors to help them become more physically fit after cancer treatments.  She was instructed to limit her alcohol consumption and continue to abstain from tobacco use.   7. Support services/counseling: It is not uncommon for this period of the patient's cancer care trajectory to be one of many emotions and stressors.  We discussed an opportunity for her to participate in the next session of [facility] ("Finding Your New Normal") support group series designed for patients after they have completed treatment.   Tracie [name] was encouraged to take advantage of our many other support services programs, support groups, and/or counseling in coping with her new life as a cancer survivor after completing anti-cancer treatment.  She was offered support today through active listening and expressive supportive counseling.  She was given information regarding our available services and encouraged to contact me with any questions or for help enrolling in any of our support group/programs.    Dispo:   -Return to cancer center every four weeks for labs/Herceptin and will f/u with Dr. Jana Hakim in January -Echo in 11/2018 -Mammogram due  -PET scan in 11/2018 -She is welcome to return back to the Survivorship Clinic at any time; no additional follow-up needed at this time.  -Consider referral back to survivorship as a long-term survivor for continued  surveillance  A total of (30) minutes of face-to-face time was spent with this patient with greater than 50% of that time in counseling and care-coordination.   Gardenia Phlegm, NP Survivorship Program [facility] [phone]   Note: PRIMARY CARE PROVIDER Precious Haws [place], [place] [phone]

## 2018-10-26 NOTE — Patient Instructions (Signed)
[facility] Discharge Instructions for Patients Receiving Chemotherapy  Today you received the following chemotherapy agents Herceptin  To help prevent nausea and vomiting after your treatment, we encourage you to take your nausea medication as directed   If you develop nausea and vomiting that is not controlled by your nausea medication, call the clinic.   BELOW ARE SYMPTOMS THAT SHOULD BE REPORTED IMMEDIATELY:  *FEVER GREATER THAN 100.5 F  *CHILLS WITH OR WITHOUT FEVER  NAUSEA AND VOMITING THAT IS NOT CONTROLLED WITH YOUR NAUSEA MEDICATION  *UNUSUAL SHORTNESS OF BREATH  *UNUSUAL BRUISING OR BLEEDING  TENDERNESS IN MOUTH AND THROAT WITH OR WITHOUT PRESENCE OF ULCERS  *URINARY PROBLEMS  *BOWEL PROBLEMS  UNUSUAL RASH Items with * indicate a potential emergency and should be followed up as soon as possible.  Feel free to call the clinic should you have any questions or concerns. The clinic phone number is [phone].  Please show the CHEMO ALERT CARD at check-in to the Emergency Department and triage nurse.   

## 2018-10-31 ENCOUNTER — Other Ambulatory Visit: Payer: Self-pay | Admitting: Oncology

## 2018-10-31 DIAGNOSIS — E876 Hypokalemia: Secondary | ICD-10-CM

## 2018-11-02 ENCOUNTER — Other Ambulatory Visit: Payer: Self-pay | Admitting: Pharmacist

## 2018-11-02 MED ORDER — ALIROCUMAB 75 MG/ML ~~LOC~~ SOPN: 75.0000 mg | PEN_INJECTOR | SUBCUTANEOUS | 11 refills | Status: DC

## 2018-11-22 ENCOUNTER — Other Ambulatory Visit: Payer: Self-pay | Admitting: *Deleted

## 2018-11-22 DIAGNOSIS — C50411 Malignant neoplasm of upper-outer quadrant of right female breast: Secondary | ICD-10-CM

## 2018-11-22 DIAGNOSIS — Z170 Estrogen receptor positive status [ER+]: Principal | ICD-10-CM

## 2018-11-22 DIAGNOSIS — C7951 Secondary malignant neoplasm of bone: Secondary | ICD-10-CM

## 2018-11-23 ENCOUNTER — Inpatient Hospital Stay: Payer: 59 | Attending: Oncology

## 2018-11-23 ENCOUNTER — Inpatient Hospital Stay: Payer: 59

## 2018-11-23 VITALS — BP 161/80 | HR 63 | Temp 98.3°F | Resp 18

## 2018-11-23 DIAGNOSIS — C7951 Secondary malignant neoplasm of bone: Secondary | ICD-10-CM

## 2018-11-23 DIAGNOSIS — Z170 Estrogen receptor positive status [ER+]: Secondary | ICD-10-CM

## 2018-11-23 DIAGNOSIS — C50411 Malignant neoplasm of upper-outer quadrant of right female breast: Secondary | ICD-10-CM

## 2018-11-23 DIAGNOSIS — Z5112 Encounter for antineoplastic immunotherapy: Secondary | ICD-10-CM | POA: Insufficient documentation

## 2018-11-23 DIAGNOSIS — Z95828 Presence of other vascular implants and grafts: Secondary | ICD-10-CM | POA: Insufficient documentation

## 2018-11-23 LAB — CBC WITH DIFFERENTIAL (CANCER CENTER ONLY)
Abs Immature Granulocytes: 0 10*3/uL (ref 0.00–0.07)
BASOS ABS: 0 10*3/uL (ref 0.0–0.1)
BASOS PCT: 1 %
EOS ABS: 0.1 10*3/uL (ref 0.0–0.5)
Eosinophils Relative: 4 %
HCT: 33.5 % — ABNORMAL LOW (ref 36.0–46.0)
Hemoglobin: 10.6 g/dL — ABNORMAL LOW (ref 12.0–15.0)
IMMATURE GRANULOCYTES: 0 %
Lymphocytes Relative: 20 %
Lymphs Abs: 0.7 10*3/uL (ref 0.7–4.0)
MCH: 29.4 pg (ref 26.0–34.0)
MCHC: 31.6 g/dL (ref 30.0–36.0)
MCV: 93.1 fL (ref 80.0–100.0)
Monocytes Absolute: 0.4 10*3/uL (ref 0.1–1.0)
Monocytes Relative: 12 %
NEUTROS PCT: 63 %
NRBC: 0 % (ref 0.0–0.2)
Neutro Abs: 2.1 10*3/uL (ref 1.7–7.7)
Platelet Count: 259 10*3/uL (ref 150–400)
RBC: 3.6 MIL/uL — ABNORMAL LOW (ref 3.87–5.11)
RDW: 17.3 % — AB (ref 11.5–15.5)
WBC Count: 3.3 10*3/uL — ABNORMAL LOW (ref 4.0–10.5)

## 2018-11-23 LAB — CMP (CANCER CENTER ONLY)
ALBUMIN: 3.3 g/dL — AB (ref 3.5–5.0)
ALT: 9 U/L (ref 0–44)
AST: 13 U/L — AB (ref 15–41)
Alkaline Phosphatase: 85 U/L (ref 38–126)
Anion gap: 9 (ref 5–15)
BUN: 12 mg/dL (ref 8–23)
CO2: 25 mmol/L (ref 22–32)
Calcium: 8.9 mg/dL (ref 8.9–10.3)
Chloride: 108 mmol/L (ref 98–111)
Creatinine: 0.86 mg/dL (ref 0.44–1.00)
GFR, Est AFR Am: >60 mL/min (ref >60)
Glucose, Bld: 95 mg/dL (ref 70–99)
POTASSIUM: 3.9 mmol/L (ref 3.5–5.1)
Sodium: 142 mmol/L (ref 135–145)
Total Bilirubin: <0.2 mg/dL — ABNORMAL LOW (ref 0.3–1.2)
Total Protein: 6.5 g/dL (ref 6.5–8.1)

## 2018-11-23 MED ORDER — ACETAMINOPHEN 325 MG PO TABS
650.0000 mg | ORAL_TABLET | Freq: Once | ORAL | Status: AC
  Administered 2018-11-23: 650 mg via ORAL

## 2018-11-23 MED ORDER — TRASTUZUMAB CHEMO 150 MG IV SOLR
450.0000 mg | Freq: Once | INTRAVENOUS | Status: AC
  Administered 2018-11-23: 450 mg via INTRAVENOUS
  Filled 2018-11-23: qty 21.4

## 2018-11-23 MED ORDER — SODIUM CHLORIDE 0.9 % IV SOLN
Freq: Once | INTRAVENOUS | Status: AC
  Administered 2018-11-23: 13:00:00 via INTRAVENOUS
  Filled 2018-11-23: qty 250

## 2018-11-23 MED ORDER — DIPHENHYDRAMINE HCL 25 MG PO CAPS
25.0000 mg | ORAL_CAPSULE | Freq: Once | ORAL | Status: AC
  Administered 2018-11-23: 25 mg via ORAL

## 2018-11-23 MED ORDER — DIPHENHYDRAMINE HCL 25 MG PO CAPS
ORAL_CAPSULE | ORAL | Status: AC
  Filled 2018-11-23: qty 1

## 2018-11-23 MED ORDER — SODIUM CHLORIDE 0.9% FLUSH
10.0000 mL | INTRAVENOUS | Status: DC | PRN
  Administered 2018-11-23: 10 mL
  Filled 2018-11-23: qty 10

## 2018-11-23 MED ORDER — ACETAMINOPHEN 325 MG PO TABS
ORAL_TABLET | ORAL | Status: AC
  Filled 2018-11-23: qty 2

## 2018-11-23 MED ORDER — SODIUM CHLORIDE 0.9% FLUSH
10.0000 mL | Freq: Once | INTRAVENOUS | Status: AC
  Administered 2018-11-23: 10 mL
  Filled 2018-11-23: qty 10

## 2018-11-23 MED ORDER — HEPARIN SOD (PORK) LOCK FLUSH 100 UNIT/ML IV SOLN
500.0000 [IU] | Freq: Once | INTRAVENOUS | Status: AC | PRN
  Administered 2018-11-23: 500 [IU]
  Filled 2018-11-23: qty 5

## 2018-11-23 NOTE — Patient Instructions (Addendum)
[facility] Discharge Instructions for Patients Receiving Chemotherapy  Today you received the following chemotherapy agents:  trastuzumab (Herceptin).  To help prevent nausea and vomiting after your treatment, we encourage you to take your nausea medication as directed.   If you develop nausea and vomiting that is not controlled by your nausea medication, call the clinic.   BELOW ARE SYMPTOMS THAT SHOULD BE REPORTED IMMEDIATELY:  *FEVER GREATER THAN 100.5 F  *CHILLS WITH OR WITHOUT FEVER  NAUSEA AND VOMITING THAT IS NOT CONTROLLED WITH YOUR NAUSEA MEDICATION  *UNUSUAL SHORTNESS OF BREATH  *UNUSUAL BRUISING OR BLEEDING  TENDERNESS IN MOUTH AND THROAT WITH OR WITHOUT PRESENCE OF ULCERS  *URINARY PROBLEMS  *BOWEL PROBLEMS  UNUSUAL RASH Items with * indicate a potential emergency and should be followed up as soon as possible.  Feel free to call the clinic should you have any questions or concerns. The clinic phone number is [phone].  Please show the [facility] at check-in to the Emergency Department and triage nurse.  Trastuzumab injection for infusion What is this medicine? TRASTUZUMAB (tras TOO zoo mab) is a monoclonal antibody. It is used to treat breast cancer and stomach cancer. This medicine may be used for other purposes; ask your health care provider or pharmacist if you have questions. COMMON BRAND NAME(S): Herceptin What should I tell my health care provider before I take this medicine? They need to know if you have any of these conditions: -heart disease -heart failure -lung or breathing disease, like asthma -an unusual or allergic reaction to trastuzumab, benzyl alcohol, or other medications, foods, dyes, or preservatives -pregnant or trying to get pregnant -breast-feeding How should I use this medicine? This drug is given as an infusion into a vein. It is administered in a hospital or clinic by a specially trained health care  professional. Talk to your pediatrician regarding the use of this medicine in children. This medicine is not approved for use in children. Overdosage: If you think you have taken too much of this medicine contact a poison control center or emergency room at once. NOTE: This medicine is only for you. Do not share this medicine with others. What if I miss a dose? It is important not to miss a dose. Call your doctor or health care professional if you are unable to keep an appointment. What may interact with this medicine? This medicine may interact with the following medications: -certain types of chemotherapy, such as daunorubicin, doxorubicin, epirubicin, and idarubicin This list may not describe all possible interactions. Give your health care provider a list of all the medicines, herbs, non-prescription drugs, or dietary supplements you use. Also tell them if you smoke, drink alcohol, or use illegal drugs. Some items may interact with your medicine. What should I watch for while using this medicine? Visit your doctor for checks on your progress. Report any side effects. Continue your course of treatment even though you feel ill unless your doctor tells you to stop. Call your doctor or health care professional for advice if you get a fever, chills or sore throat, or other symptoms of a cold or flu. Do not treat yourself. Try to avoid being around people who are sick. You may experience fever, chills and shaking during your first infusion. These effects are usually mild and can be treated with other medicines. Report any side effects during the infusion to your health care professional. Fever and chills usually do not happen with later infusions. Do not become  pregnant while taking this medicine or for 7 months after stopping it. Women should inform their doctor if they wish to become pregnant or think they might be pregnant. Women of child-bearing potential will need to have a negative pregnancy test  before starting this medicine. There is a potential for serious side effects to an unborn child. Talk to your health care professional or pharmacist for more information. Do not breast-feed an infant while taking this medicine or for 7 months after stopping it. Women must use effective birth control with this medicine. What side effects may I notice from receiving this medicine? Side effects that you should report to your doctor or health care professional as soon as possible: -allergic reactions like skin rash, itching or hives, swelling of the face, lips, or tongue -chest pain or palpitations -cough -dizziness -feeling faint or lightheaded, falls -fever -general ill feeling or flu-like symptoms -signs of worsening heart failure like breathing problems; swelling in your legs and feet -unusually weak or tired Side effects that usually do not require medical attention (report to your doctor or health care professional if they continue or are bothersome): -bone pain -changes in taste -diarrhea -joint pain -nausea/vomiting -weight loss This list may not describe all possible side effects. Call your doctor for medical advice about side effects. You may report side effects to FDA at 1-800-FDA-1088. Where should I keep my medicine? This drug is given in a hospital or clinic and will not be stored at home. NOTE: This sheet is a summary. It may not cover all possible information. If you have questions about this medicine, talk to your doctor, pharmacist, or health care provider.  2018 Elsevier/Gold Standard (2016-12-09 14:37:52)

## 2018-11-24 LAB — CANCER ANTIGEN 27.29: CAN 27.29: 25.4 U/mL (ref 0.0–38.6)

## 2018-12-02 ENCOUNTER — Other Ambulatory Visit: Payer: Self-pay | Admitting: Oncology

## 2018-12-02 DIAGNOSIS — E876 Hypokalemia: Secondary | ICD-10-CM

## 2018-12-06 ENCOUNTER — Encounter (HOSPITAL_COMMUNITY): Payer: Self-pay | Admitting: Cardiology

## 2018-12-06 ENCOUNTER — Telehealth: Payer: Self-pay | Admitting: Oncology

## 2018-12-06 ENCOUNTER — Other Ambulatory Visit: Payer: Self-pay | Admitting: Oncology

## 2018-12-06 ENCOUNTER — Ambulatory Visit (HOSPITAL_COMMUNITY)
Admission: RE | Admit: 2018-12-06 | Discharge: 2018-12-06 | Disposition: A | Discharge status: Home / Self Care | Payer: 59 | Source: Ambulatory Visit | Attending: Family Medicine | Admitting: Family Medicine

## 2018-12-06 ENCOUNTER — Ambulatory Visit (HOSPITAL_BASED_OUTPATIENT_CLINIC_OR_DEPARTMENT_OTHER)
Admission: RE | Admit: 2018-12-06 | Discharge: 2018-12-06 | Disposition: A | Discharge status: Home / Self Care | Payer: 59 | Source: Ambulatory Visit | Attending: Cardiology | Admitting: Cardiology

## 2018-12-06 VITALS — BP 132/74 | HR 76 | Wt 159.4 lb

## 2018-12-06 DIAGNOSIS — Z9221 Personal history of antineoplastic chemotherapy: Secondary | ICD-10-CM | POA: Insufficient documentation

## 2018-12-06 DIAGNOSIS — C50411 Malignant neoplasm of upper-outer quadrant of right female breast: Secondary | ICD-10-CM | POA: Diagnosis present

## 2018-12-06 DIAGNOSIS — Z79899 Other long term (current) drug therapy: Secondary | ICD-10-CM | POA: Insufficient documentation

## 2018-12-06 DIAGNOSIS — I429 Cardiomyopathy, unspecified: Secondary | ICD-10-CM

## 2018-12-06 DIAGNOSIS — Z809 Family history of malignant neoplasm, unspecified: Secondary | ICD-10-CM | POA: Diagnosis not present

## 2018-12-06 DIAGNOSIS — I10 Essential (primary) hypertension: Secondary | ICD-10-CM | POA: Diagnosis not present

## 2018-12-06 DIAGNOSIS — Z170 Estrogen receptor positive status [ER+]: Secondary | ICD-10-CM | POA: Diagnosis not present

## 2018-12-06 DIAGNOSIS — J45909 Unspecified asthma, uncomplicated: Secondary | ICD-10-CM | POA: Insufficient documentation

## 2018-12-06 DIAGNOSIS — Z8249 Family history of ischemic heart disease and other diseases of the circulatory system: Secondary | ICD-10-CM | POA: Insufficient documentation

## 2018-12-06 DIAGNOSIS — I428 Other cardiomyopathies: Secondary | ICD-10-CM | POA: Diagnosis not present

## 2018-12-06 DIAGNOSIS — F17210 Nicotine dependence, cigarettes, uncomplicated: Secondary | ICD-10-CM | POA: Insufficient documentation

## 2018-12-06 DIAGNOSIS — Z79890 Hormone replacement therapy (postmenopausal): Secondary | ICD-10-CM | POA: Insufficient documentation

## 2018-12-06 DIAGNOSIS — E039 Hypothyroidism, unspecified: Secondary | ICD-10-CM | POA: Diagnosis not present

## 2018-12-06 DIAGNOSIS — Z9049 Acquired absence of other specified parts of digestive tract: Secondary | ICD-10-CM | POA: Insufficient documentation

## 2018-12-06 DIAGNOSIS — E785 Hyperlipidemia, unspecified: Secondary | ICD-10-CM | POA: Insufficient documentation

## 2018-12-06 MED ORDER — METOPROLOL SUCCINATE ER 25 MG PO TB24: 75.0000 mg | ORAL_TABLET | Freq: Every day | ORAL | 3 refills | Status: DC

## 2018-12-06 MED ORDER — METOPROLOL SUCCINATE ER 25 MG PO TB24: 75.0000 mg | ORAL_TABLET | Freq: Every day | ORAL | 6 refills | Status: DC

## 2018-12-06 NOTE — Progress Notes (Signed)
  Echocardiogram 2D Echocardiogram has been performed.  Tracie [name] M 12/06/2018, 8:41 AM

## 2018-12-06 NOTE — Telephone Encounter (Signed)
[facility] 1/21 - moved appointments to 1/22. Spoke with patient.

## 2018-12-06 NOTE — Progress Notes (Signed)
Oncology: Dr. Jana Hakim  67 y.o. with history of HTN, asthma, and hyperlidemia was initially referred by Dr. Jana Hakim for cardio-oncology evaluation.  She was diagnosed with breast cancer on the right in 9/18. ER+/PR-.  Lymph node was HER2+, breast biopsy was HER2-.  She completed neoadjuvant chemotherapy with carboplatin, docetaxel, trastuzumab, pertuzumab x 6 cycles and now is on trastuzumab alone with plan for indefinite treatment.      She returns for cardio-oncology followup.  No chest pain.  She has occasional dyspnea associated with wheezing, and has history of asthma.  Symptoms arise when she is "extremely cold or hot."  She still smokes 1/2 ppd.  No orthopnea/PND.    Echo was done today and reviewed. EF 50-55%, basal anterolateral and inferolateral hypokinesis, basal inferior severe hypokinesis.  GLS low at -13.6%.   ECG (personally reviewed): NSR, LVH, nonspecific lateral TWIs    Labs (3/19): LDL 60 Labs (6/19): K 3.3, creatinine 1.05 Labs (11/19): K 3.9, creatinine 0.86  PMH: 1. Asthma: mild. 2. HTN 3. Hyperlipidemia 4. Hypothyroidism 5. Diverticulitis with h/o colectomy.  6. Breast cancer: On right, diagnosed 9/18.  ER+/PR-.  Lymph node was HER2+, breast biopsy was HER2-.  She is getting neoadjuvant chemotherapy with carboplatin, docetaxel, trastuzumab, pertuzumab x 6 cycles then trastuzumab indefinitely She will have surgery and radiation.  - Echo (9/18): EF 50-55%, restrictive diastolic function, mild AI, mild-moderate MR.  - Echo (12/18): EF 55-60% with mild focal basal septal hypertrophy, GLS -20.5%, normal RV size and systolic function, mild AI, moderate MR.  - Echo (3/19): EF 60-65%, mild LVH, moderate diastolic dysfunction, strain inaccurate, mild AI, moderate MR, normla RV size and systolic function.  - Echo (6/19): EF 60-65%, mild LVH, GLS -16.4%, moderate diastolic dysfunction, normal RV size and systolic function, moderate MR, mild AI.  - Echo (9/19): EF 55-60%,  moderate diastolic dysfunction, moderate MR, mild AI, GLS -13.7% (but using a different machine).  - Echo (12/19): EF 50-55%, basal anterolateral and inferolateral hypokinesis, basal inferior severe hypokinesis.  Grade II diastolic dysfunction, GLS low at -13.6%.  Normal RV size and systolic function.  7. Active smoker  Social History   Socioeconomic History  . Marital status: Divorced    Spouse name: Not on file  . Number of children: Not on file  . Years of education: Not on file  . Highest education level: Not on file  Occupational History  . Not on file  Social Needs  . Financial resource strain: Not on file  . Food insecurity:    Worry: Not on file    Inability: Not on file  . Transportation needs:    Medical: Not on file    Non-medical: Not on file  Tobacco Use  . Smoking status: Current Some Day Smoker    Packs/day: 0.25    Years: 40.00    Pack years: 10.00    Types: Cigarettes  . Smokeless tobacco: Never Used  . Tobacco comment: she is smoking 2 or more daily, she plans to use nicotine patch.   Substance and Sexual Activity  . Alcohol use: Yes    Comment: occasional  . Drug use: No  . Sexual activity: Not on file  Lifestyle  . Physical activity:    Days per week: Not on file    Minutes per session: Not on file  . Stress: Not on file  Relationships  . Social connections:    Talks on phone: Not on file    Gets together: Not on file  Attends religious service: Not on file    Active member of club or organization: Not on file    Attends meetings of clubs or organizations: Not on file    Relationship status: Not on file  . Intimate partner violence:    Fear of current or ex partner: Not on file    Emotionally abused: Not on file    Physically abused: Not on file    Forced sexual activity: Not on file  Other Topics Concern  . Not on file  Social History Narrative  . Not on file   Family History  Problem Relation Age of Onset  . Cancer Mother         rectal  . Hypertension Brother   . Hypertension Brother   . Asthma Son   . Asthma Brother    ROS: All systems reviewed and negative except as per HPI.   Current Outpatient Medications  Medication Sig Dispense Refill  . albuterol (PROAIR HFA) 108 (90 Base) MCG/ACT inhaler INHALE 2 PUFFS BY MOUTH INTO THE LUNGS EVERY 6 HOURS AS NEEDED FOR WHEEZING    . Alirocumab (PRALUENT) 75 MG/ML SOPN Inject 75 mg into the skin every 14 (fourteen) days. 2 pen 11  . amLODipine (NORVASC) 10 MG tablet Take 10 mg by mouth daily.     Marland Kitchen anastrozole (ARIMIDEX) 1 MG tablet Take 1 tablet (1 mg total) by mouth daily. 90 tablet 4  . cetirizine (ZYRTEC) 10 MG tablet Take 10 mg by mouth daily as needed for allergies.   11  . lidocaine-prilocaine (EMLA) cream Apply 1 application topically as needed (port access). 30 g 0  . losartan-hydrochlorothiazide (HYZAAR) 100-12.5 MG tablet Take 1 tablet by mouth daily.   3  . mometasone-formoterol (DULERA) 100-5 MCG/ACT AERO Inhale 2 puffs into the lungs 2 (two) times daily. 3 Inhaler 0  . Naphazoline-Glycerin (REDNESS RELIEF OP) Place 1 drop into both eyes daily as needed (redness).    . potassium chloride (K-DUR) 10 MEQ tablet TAKE 2 TABLETS(20 MEQ) BY MOUTH DAILY 60 tablet 0  . acetaminophen-codeine (TYLENOL #3) 300-30 MG tablet     . levothyroxine (SYNTHROID, LEVOTHROID) 50 MCG tablet Take 50 mcg by mouth daily.    . metoprolol succinate (TOPROL XL) 25 MG 24 hr tablet Take 3 tablets (75 mg total) by mouth daily. 270 tablet 3  . nitroGLYCERIN (NITROSTAT) 0.4 MG SL tablet Place 1 tablet (0.4 mg total) under the tongue every 5 (five) minutes as needed for chest pain. (Patient not taking: Reported on 12/06/2018) 75 tablet 3  . traMADol (ULTRAM) 50 MG tablet Take 1 tablet (50 mg total) by mouth every 12 (twelve) hours as needed (pain). (Patient not taking: Reported on 12/06/2018) 20 tablet 0   No current facility-administered medications for this encounter.    BP 132/74   Pulse 76    Wt 72.3 kg (159 lb 6.4 oz)   SpO2 96%   BMI 28.24 kg/m   General: NAD Neck: No JVD, no thyromegaly or thyroid nodule.  Lungs: Clear to auscultation bilaterally with normal respiratory effort. CV: Nondisplaced PMI.  Heart regular S1/S2, no S3/S4, no murmur.  No peripheral edema.  No carotid bruit.  Normal pedal pulses.  Abdomen: Soft, nontender, no hepatosplenomegaly, no distention.  Skin: Intact without lesions or rashes.  Neurologic: Alert and oriented x 3.  Psych: Normal affect. Extremities: No clubbing or cyanosis.  HEENT: Normal.   Assessment/Plan: 1. Smoking: I encouraged her again to quit.  2. HTN: BP  controlled on current regimen. 3. Hyperlipidemia: Continue Praluent. 4. Cardiomyopathy: Echo was done today and reviewed, EF mildly decreased at 50-55% with wall motion abnormalities as noted above.  This appears new compared to last echo.  This could be related to Herceptin, but given regionality of wall motion abnormalities, CAD has to be a concern, especially given her ongoing smoking.  - I will arrange for coronary CT angiogram to assess for significant CAD.  - Continue losartan 100 mg daily.  - Increase Toprol XL to 75 mg daily.  - Hold Herceptin for now (see below).  5. Breast cancer: Plan now is for indefinite Herceptin. As above, I reviewed today's echo and it does show changes.  Need to rule out CAD, as above (given regionality of wall motion abnormalities).  However, I also think we should hold off on Herceptin for now (would not be due for another dose until 12/23.  As above, I will increase Toprol XL.  Followup in 2-3 weeks (after CTA).   Loralie Champagne 12/06/2018

## 2018-12-06 NOTE — Patient Instructions (Signed)
Your physician requested that you have a Coronary CT of your heart. Take Toprol XL the morning of your scan.  INCREASE Toprol XL to 75mg  daily  Your physician recommends that you schedule a follow-up appointment in: 2-3 weeks with Dr. Aundra Dubin

## 2018-12-07 ENCOUNTER — Other Ambulatory Visit (HOSPITAL_COMMUNITY): Payer: Self-pay | Admitting: *Deleted

## 2018-12-07 DIAGNOSIS — I428 Other cardiomyopathies: Secondary | ICD-10-CM

## 2018-12-07 NOTE — Addendum Note (Signed)
Encounter addended by: Harvie Junior, CMA on: 12/07/2018 8:34 AM  Actions taken: Visit diagnoses modified, Order list changed, Diagnosis association updated

## 2018-12-08 ENCOUNTER — Other Ambulatory Visit (HOSPITAL_COMMUNITY): Payer: Self-pay

## 2018-12-08 MED ORDER — METOPROLOL SUCCINATE ER 25 MG PO TB24: 75.0000 mg | ORAL_TABLET | Freq: Every day | ORAL | 3 refills | Status: DC

## 2018-12-16 ENCOUNTER — Other Ambulatory Visit: Payer: Self-pay | Admitting: *Deleted

## 2018-12-16 ENCOUNTER — Encounter: Payer: Self-pay | Admitting: Adult Health

## 2018-12-16 DIAGNOSIS — Z170 Estrogen receptor positive status [ER+]: Principal | ICD-10-CM

## 2018-12-16 DIAGNOSIS — C7951 Secondary malignant neoplasm of bone: Secondary | ICD-10-CM

## 2018-12-16 DIAGNOSIS — C50411 Malignant neoplasm of upper-outer quadrant of right female breast: Secondary | ICD-10-CM

## 2018-12-20 ENCOUNTER — Inpatient Hospital Stay: Payer: 59

## 2018-12-20 ENCOUNTER — Inpatient Hospital Stay: Payer: 59 | Attending: Oncology

## 2018-12-20 VITALS — BP 144/81 | HR 79 | Temp 98.0°F | Resp 16

## 2018-12-20 DIAGNOSIS — C50411 Malignant neoplasm of upper-outer quadrant of right female breast: Secondary | ICD-10-CM

## 2018-12-20 DIAGNOSIS — Z170 Estrogen receptor positive status [ER+]: Secondary | ICD-10-CM

## 2018-12-20 DIAGNOSIS — Z95828 Presence of other vascular implants and grafts: Secondary | ICD-10-CM

## 2018-12-20 DIAGNOSIS — Z5112 Encounter for antineoplastic immunotherapy: Secondary | ICD-10-CM | POA: Insufficient documentation

## 2018-12-20 DIAGNOSIS — C7951 Secondary malignant neoplasm of bone: Secondary | ICD-10-CM

## 2018-12-20 LAB — CMP (CANCER CENTER ONLY)
ALT: 12 U/L (ref 0–44)
AST: 13 U/L — ABNORMAL LOW (ref 15–41)
Albumin: 3.3 g/dL — ABNORMAL LOW (ref 3.5–5.0)
Alkaline Phosphatase: 88 U/L (ref 38–126)
Anion gap: 8 (ref 5–15)
BUN: 11 mg/dL (ref 8–23)
CHLORIDE: 106 mmol/L (ref 98–111)
CO2: 26 mmol/L (ref 22–32)
Calcium: 9.4 mg/dL (ref 8.9–10.3)
Creatinine: 0.89 mg/dL (ref 0.44–1.00)
Glucose, Bld: 101 mg/dL — ABNORMAL HIGH (ref 70–99)
Potassium: 3.5 mmol/L (ref 3.5–5.1)
Sodium: 140 mmol/L (ref 135–145)
Total Bilirubin: <0.2 mg/dL — ABNORMAL LOW (ref 0.3–1.2)
Total Protein: 6.5 g/dL (ref 6.5–8.1)

## 2018-12-20 LAB — CBC WITH DIFFERENTIAL (CANCER CENTER ONLY)
Abs Immature Granulocytes: 0.02 10*3/uL (ref 0.00–0.07)
Basophils Absolute: 0 10*3/uL (ref 0.0–0.1)
Basophils Relative: 0 %
Eosinophils Absolute: 0.2 10*3/uL (ref 0.0–0.5)
Eosinophils Relative: 7 %
HCT: 35.1 % — ABNORMAL LOW (ref 36.0–46.0)
Hemoglobin: 11.1 g/dL — ABNORMAL LOW (ref 12.0–15.0)
Immature Granulocytes: 1 %
Lymphocytes Relative: 23 %
Lymphs Abs: 0.8 10*3/uL (ref 0.7–4.0)
MCH: 28.8 pg (ref 26.0–34.0)
MCHC: 31.6 g/dL (ref 30.0–36.0)
MCV: 90.9 fL (ref 80.0–100.0)
Monocytes Absolute: 0.4 10*3/uL (ref 0.1–1.0)
Monocytes Relative: 13 %
NRBC: 0 % (ref 0.0–0.2)
Neutro Abs: 1.8 10*3/uL (ref 1.7–7.7)
Neutrophils Relative %: 56 %
Platelet Count: 267 10*3/uL (ref 150–400)
RBC: 3.86 MIL/uL — ABNORMAL LOW (ref 3.87–5.11)
RDW: 17.1 % — ABNORMAL HIGH (ref 11.5–15.5)
WBC Count: 3.3 10*3/uL — ABNORMAL LOW (ref 4.0–10.5)

## 2018-12-20 MED ORDER — ACETAMINOPHEN 325 MG PO TABS
ORAL_TABLET | ORAL | Status: AC
  Filled 2018-12-20: qty 2

## 2018-12-20 MED ORDER — SODIUM CHLORIDE 0.9 % IV SOLN
Freq: Once | INTRAVENOUS | Status: AC
  Administered 2018-12-20: 09:00:00 via INTRAVENOUS
  Filled 2018-12-20: qty 250

## 2018-12-20 MED ORDER — TRASTUZUMAB CHEMO 150 MG IV SOLR
450.0000 mg | Freq: Once | INTRAVENOUS | Status: AC
  Administered 2018-12-20: 450 mg via INTRAVENOUS
  Filled 2018-12-20: qty 21.43

## 2018-12-20 MED ORDER — SODIUM CHLORIDE 0.9% FLUSH
10.0000 mL | Freq: Once | INTRAVENOUS | Status: AC
  Administered 2018-12-20: 10 mL
  Filled 2018-12-20: qty 10

## 2018-12-20 MED ORDER — DIPHENHYDRAMINE HCL 25 MG PO CAPS
ORAL_CAPSULE | ORAL | Status: AC
  Filled 2018-12-20: qty 1

## 2018-12-20 MED ORDER — HEPARIN SOD (PORK) LOCK FLUSH 100 UNIT/ML IV SOLN
500.0000 [IU] | Freq: Once | INTRAVENOUS | Status: AC
  Administered 2018-12-20: 500 [IU]
  Filled 2018-12-20: qty 5

## 2018-12-20 MED ORDER — DIPHENHYDRAMINE HCL 25 MG PO CAPS
25.0000 mg | ORAL_CAPSULE | Freq: Once | ORAL | Status: AC
  Administered 2018-12-20: 25 mg via ORAL

## 2018-12-20 MED ORDER — ACETAMINOPHEN 325 MG PO TABS
650.0000 mg | ORAL_TABLET | Freq: Once | ORAL | Status: AC
  Administered 2018-12-20: 650 mg via ORAL

## 2018-12-20 NOTE — Patient Instructions (Signed)
[facility] Discharge Instructions for Patients Receiving Chemotherapy  Today you received the following chemotherapy agents:  trastuzumab (Herceptin).  To help prevent nausea and vomiting after your treatment, we encourage you to take your nausea medication as directed.   If you develop nausea and vomiting that is not controlled by your nausea medication, call the clinic.   BELOW ARE SYMPTOMS THAT SHOULD BE REPORTED IMMEDIATELY:  *FEVER GREATER THAN 100.5 F  *CHILLS WITH OR WITHOUT FEVER  NAUSEA AND VOMITING THAT IS NOT CONTROLLED WITH YOUR NAUSEA MEDICATION  *UNUSUAL SHORTNESS OF BREATH  *UNUSUAL BRUISING OR BLEEDING  TENDERNESS IN MOUTH AND THROAT WITH OR WITHOUT PRESENCE OF ULCERS  *URINARY PROBLEMS  *BOWEL PROBLEMS  UNUSUAL RASH Items with * indicate a potential emergency and should be followed up as soon as possible.  Feel free to call the clinic should you have any questions or concerns. The clinic phone number is [phone].  Please show the [facility] at check-in to the Emergency Department and triage nurse.  Trastuzumab injection for infusion What is this medicine? TRASTUZUMAB (tras TOO zoo mab) is a monoclonal antibody. It is used to treat breast cancer and stomach cancer. This medicine may be used for other purposes; ask your health care provider or pharmacist if you have questions. COMMON BRAND NAME(S): Herceptin What should I tell my health care provider before I take this medicine? They need to know if you have any of these conditions: -heart disease -heart failure -lung or breathing disease, like asthma -an unusual or allergic reaction to trastuzumab, benzyl alcohol, or other medications, foods, dyes, or preservatives -pregnant or trying to get pregnant -breast-feeding How should I use this medicine? This drug is given as an infusion into a vein. It is administered in a hospital or clinic by a specially trained health care  professional. Talk to your pediatrician regarding the use of this medicine in children. This medicine is not approved for use in children. Overdosage: If you think you have taken too much of this medicine contact a poison control center or emergency room at once. NOTE: This medicine is only for you. Do not share this medicine with others. What if I miss a dose? It is important not to miss a dose. Call your doctor or health care professional if you are unable to keep an appointment. What may interact with this medicine? This medicine may interact with the following medications: -certain types of chemotherapy, such as daunorubicin, doxorubicin, epirubicin, and idarubicin This list may not describe all possible interactions. Give your health care provider a list of all the medicines, herbs, non-prescription drugs, or dietary supplements you use. Also tell them if you smoke, drink alcohol, or use illegal drugs. Some items may interact with your medicine. What should I watch for while using this medicine? Visit your doctor for checks on your progress. Report any side effects. Continue your course of treatment even though you feel ill unless your doctor tells you to stop. Call your doctor or health care professional for advice if you get a fever, chills or sore throat, or other symptoms of a cold or flu. Do not treat yourself. Try to avoid being around people who are sick. You may experience fever, chills and shaking during your first infusion. These effects are usually mild and can be treated with other medicines. Report any side effects during the infusion to your health care professional. Fever and chills usually do not happen with later infusions. Do not become  pregnant while taking this medicine or for 7 months after stopping it. Women should inform their doctor if they wish to become pregnant or think they might be pregnant. Women of child-bearing potential will need to have a negative pregnancy test  before starting this medicine. There is a potential for serious side effects to an unborn child. Talk to your health care professional or pharmacist for more information. Do not breast-feed an infant while taking this medicine or for 7 months after stopping it. Women must use effective birth control with this medicine. What side effects may I notice from receiving this medicine? Side effects that you should report to your doctor or health care professional as soon as possible: -allergic reactions like skin rash, itching or hives, swelling of the face, lips, or tongue -chest pain or palpitations -cough -dizziness -feeling faint or lightheaded, falls -fever -general ill feeling or flu-like symptoms -signs of worsening heart failure like breathing problems; swelling in your legs and feet -unusually weak or tired Side effects that usually do not require medical attention (report to your doctor or health care professional if they continue or are bothersome): -bone pain -changes in taste -diarrhea -joint pain -nausea/vomiting -weight loss This list may not describe all possible side effects. Call your doctor for medical advice about side effects. You may report side effects to FDA at 1-800-FDA-1088. Where should I keep my medicine? This drug is given in a hospital or clinic and will not be stored at home. NOTE: This sheet is a summary. It may not cover all possible information. If you have questions about this medicine, talk to your doctor, pharmacist, or health care provider.  2018 Elsevier/Gold Standard (2016-12-09 14:37:52)

## 2018-12-27 ENCOUNTER — Telehealth (HOSPITAL_COMMUNITY): Payer: Self-pay | Admitting: Emergency Medicine

## 2018-12-27 ENCOUNTER — Encounter (HOSPITAL_COMMUNITY): Payer: 59 | Admitting: Cardiology

## 2018-12-27 NOTE — Telephone Encounter (Signed)
Reaching out to patient to offer assistance regarding upcoming cardiac imaging study; pt verbalizes understanding of appt date/time, parking situation and where to check in, pre-test NPO status and medications ordered, and verified current allergies; name and call back number provided for further questions should they arise Pierson Vantol RN Navigator Cardiac Imaging [phone] 

## 2018-12-28 ENCOUNTER — Ambulatory Visit (HOSPITAL_COMMUNITY)
Admission: RE | Admit: 2018-12-28 | Discharge: 2018-12-28 | Disposition: A | Discharge status: Home / Self Care | Payer: 59 | Source: Ambulatory Visit | Attending: Cardiology | Admitting: Cardiology

## 2018-12-28 DIAGNOSIS — I428 Other cardiomyopathies: Secondary | ICD-10-CM | POA: Insufficient documentation

## 2018-12-28 MED ORDER — NITROGLYCERIN 0.4 MG SL SUBL
0.8000 mg | SUBLINGUAL_TABLET | Freq: Once | SUBLINGUAL | Status: AC
  Administered 2018-12-28: 0.8 mg via SUBLINGUAL
  Filled 2018-12-28: qty 25

## 2018-12-28 MED ORDER — NITROGLYCERIN 0.4 MG SL SUBL
SUBLINGUAL_TABLET | SUBLINGUAL | Status: AC
  Filled 2018-12-28: qty 2

## 2018-12-28 MED ORDER — IOPAMIDOL (ISOVUE-370) INJECTION 76%
100.0000 mL | Freq: Once | INTRAVENOUS | Status: AC | PRN
  Administered 2018-12-28: 100 mL via INTRAVENOUS

## 2018-12-28 MED ORDER — METOPROLOL TARTRATE 5 MG/5ML IV SOLN
5.0000 mg | INTRAVENOUS | Status: DC | PRN
  Filled 2018-12-28: qty 5

## 2018-12-28 MED ORDER — HEPARIN SOD (PORK) LOCK FLUSH 100 UNIT/ML IV SOLN
500.0000 [IU] | INTRAVENOUS | Status: AC | PRN
  Administered 2018-12-28: 500 [IU]

## 2019-01-04 ENCOUNTER — Ambulatory Visit (HOSPITAL_COMMUNITY)
Admission: RE | Admit: 2019-01-04 | Discharge: 2019-01-04 | Disposition: A | Discharge status: Home / Self Care | Payer: Managed Care, Other (non HMO) | Source: Ambulatory Visit | Attending: Cardiology | Admitting: Cardiology

## 2019-01-04 ENCOUNTER — Encounter (HOSPITAL_COMMUNITY): Payer: Self-pay | Admitting: Cardiology

## 2019-01-04 VITALS — BP 130/80 | HR 89 | Wt 158.4 lb

## 2019-01-04 DIAGNOSIS — J45909 Unspecified asthma, uncomplicated: Secondary | ICD-10-CM | POA: Insufficient documentation

## 2019-01-04 DIAGNOSIS — Z853 Personal history of malignant neoplasm of breast: Secondary | ICD-10-CM | POA: Diagnosis not present

## 2019-01-04 DIAGNOSIS — F17210 Nicotine dependence, cigarettes, uncomplicated: Secondary | ICD-10-CM | POA: Diagnosis not present

## 2019-01-04 DIAGNOSIS — Z79890 Hormone replacement therapy (postmenopausal): Secondary | ICD-10-CM | POA: Diagnosis not present

## 2019-01-04 DIAGNOSIS — I255 Ischemic cardiomyopathy: Secondary | ICD-10-CM

## 2019-01-04 DIAGNOSIS — I10 Essential (primary) hypertension: Secondary | ICD-10-CM | POA: Insufficient documentation

## 2019-01-04 DIAGNOSIS — F17200 Nicotine dependence, unspecified, uncomplicated: Secondary | ICD-10-CM

## 2019-01-04 DIAGNOSIS — Z79899 Other long term (current) drug therapy: Secondary | ICD-10-CM | POA: Insufficient documentation

## 2019-01-04 DIAGNOSIS — C50411 Malignant neoplasm of upper-outer quadrant of right female breast: Secondary | ICD-10-CM | POA: Diagnosis not present

## 2019-01-04 DIAGNOSIS — E039 Hypothyroidism, unspecified: Secondary | ICD-10-CM | POA: Diagnosis not present

## 2019-01-04 DIAGNOSIS — E785 Hyperlipidemia, unspecified: Secondary | ICD-10-CM | POA: Insufficient documentation

## 2019-01-04 DIAGNOSIS — I428 Other cardiomyopathies: Secondary | ICD-10-CM

## 2019-01-04 DIAGNOSIS — Z9221 Personal history of antineoplastic chemotherapy: Secondary | ICD-10-CM | POA: Insufficient documentation

## 2019-01-04 DIAGNOSIS — I2510 Atherosclerotic heart disease of native coronary artery without angina pectoris: Secondary | ICD-10-CM | POA: Insufficient documentation

## 2019-01-04 DIAGNOSIS — I429 Cardiomyopathy, unspecified: Secondary | ICD-10-CM | POA: Diagnosis not present

## 2019-01-04 DIAGNOSIS — Z170 Estrogen receptor positive status [ER+]: Secondary | ICD-10-CM

## 2019-01-04 DIAGNOSIS — Z7982 Long term (current) use of aspirin: Secondary | ICD-10-CM | POA: Diagnosis not present

## 2019-01-04 DIAGNOSIS — R911 Solitary pulmonary nodule: Secondary | ICD-10-CM

## 2019-01-04 DIAGNOSIS — Z8249 Family history of ischemic heart disease and other diseases of the circulatory system: Secondary | ICD-10-CM | POA: Diagnosis not present

## 2019-01-04 MED ORDER — NICOTINE 14 MG/24HR TD PT24: 14.0000 mg | MEDICATED_PATCH | Freq: Every day | TRANSDERMAL | 0 refills | Status: AC

## 2019-01-04 MED ORDER — DOXYCYCLINE HYCLATE 100 MG PO CAPS: 100.0000 mg | ORAL_CAPSULE | Freq: Two times a day (BID) | ORAL | 0 refills | Status: AC

## 2019-01-04 MED ORDER — ASPIRIN EC 81 MG PO TBEC: 81.0000 mg | DELAYED_RELEASE_TABLET | Freq: Every day | ORAL | 3 refills | Status: AC

## 2019-01-04 NOTE — Progress Notes (Signed)
Oncology: Dr. Jana Hakim  67 y.o. with history of HTN, asthma, and hyperlidemia was initially referred by Dr. Jana Hakim for cardio-oncology evaluation.  She was diagnosed with breast cancer on the right in 9/18. ER+/PR-.  Lymph node was HER2+, breast biopsy was HER2-.  She completed neoadjuvant chemotherapy with carboplatin, docetaxel, trastuzumab, pertuzumab x 6 cycles and now is on trastuzumab alone with plan for indefinite treatment.      Last echo in 12/19 showed. EF 50-55%, basal anterolateral and inferolateral hypokinesis, basal inferior severe hypokinesis.  GLS low at -13.6%.  Given fall in EF with regional wall motion abnormalities, coronary CTA was done, showing occluded mid LCx and possible severe stenosis in the proximal PDA.    She returns for followup of CAD and cardiomyopathy. She continues to smoke 1/2 ppd.  No chest pain.  She has occasional episodes of dyspnea but attributes this to asthma.  She can go up and down a flight of stairs without problems.  She continues to get Herceptin.  Also of note, on the lung fields of her coronary CT, there was right lung airspace disease thought to be possibly infectious in etiology.  She has had a cough for 8-9 days now.    ECG (personally reviewed): NSR, septal Qs  Labs (3/19): LDL 60 Labs (6/19): K 3.3, creatinine 1.05 Labs (11/19): K 3.9, creatinine 0.86 Labs (12/19): K 3.5, creatinine 0.89  PMH: 1. Asthma: mild. 2. HTN 3. Hyperlipidemia 4. Hypothyroidism 5. Diverticulitis with h/o colectomy.  6. Breast cancer: On right, diagnosed 9/18.  ER+/PR-.  Lymph node was HER2+, breast biopsy was HER2-.  She is getting neoadjuvant chemotherapy with carboplatin, docetaxel, trastuzumab, pertuzumab x 6 cycles then trastuzumab indefinitely She will have surgery and radiation.  - Echo (9/18): EF 50-55%, restrictive diastolic function, mild AI, mild-moderate MR.  - Echo (12/18): EF 55-60% with mild focal basal septal hypertrophy, GLS -20.5%, normal RV size  and systolic function, mild AI, moderate MR.  - Echo (3/19): EF 60-65%, mild LVH, moderate diastolic dysfunction, strain inaccurate, mild AI, moderate MR, normla RV size and systolic function.  - Echo (6/19): EF 60-65%, mild LVH, GLS -16.4%, moderate diastolic dysfunction, normal RV size and systolic function, moderate MR, mild AI.  - Echo (9/19): EF 55-60%, moderate diastolic dysfunction, moderate MR, mild AI, GLS -13.7% (but using a different machine).  - Echo (12/19): EF 50-55%, basal anterolateral and inferolateral hypokinesis, basal inferior severe hypokinesis.  Grade II diastolic dysfunction, GLS low at -13.6%.  Normal RV size and systolic function.  7. Active smoker 8. CAD: Coronary CTA (12/19) with possible severe PDA stenosis (unable to assess by FFR), mid LAD non-significant stenosis (FFR 0.81), and occluded mid-distal LCx.   Social History   Socioeconomic History  . Marital status: Divorced    Spouse name: Not on file  . Number of children: Not on file  . Years of education: Not on file  . Highest education level: Not on file  Occupational History  . Not on file  Social Needs  . Financial resource strain: Not on file  . Food insecurity:    Worry: Not on file    Inability: Not on file  . Transportation needs:    Medical: Not on file    Non-medical: Not on file  Tobacco Use  . Smoking status: Current Some Day Smoker    Packs/day: 0.25    Years: 40.00    Pack years: 10.00    Types: Cigarettes  . Smokeless tobacco: Never Used  .  Tobacco comment: she is smoking 2 or more daily, she plans to use nicotine patch.   Substance and Sexual Activity  . Alcohol use: Yes    Comment: occasional  . Drug use: No  . Sexual activity: Not on file  Lifestyle  . Physical activity:    Days per week: Not on file    Minutes per session: Not on file  . Stress: Not on file  Relationships  . Social connections:    Talks on phone: Not on file    Gets together: Not on file    Attends  religious service: Not on file    Active member of club or organization: Not on file    Attends meetings of clubs or organizations: Not on file    Relationship status: Not on file  . Intimate partner violence:    Fear of current or ex partner: Not on file    Emotionally abused: Not on file    Physically abused: Not on file    Forced sexual activity: Not on file  Other Topics Concern  . Not on file  Social History Narrative  . Not on file   Family History  Problem Relation Age of Onset  . Cancer Mother        rectal  . Hypertension Brother   . Hypertension Brother   . Asthma Son   . Asthma Brother    ROS: All systems reviewed and negative except as per HPI.   Current Outpatient Medications  Medication Sig Dispense Refill  . albuterol (PROAIR HFA) 108 (90 Base) MCG/ACT inhaler INHALE 2 PUFFS BY MOUTH INTO THE LUNGS EVERY 6 HOURS AS NEEDED FOR WHEEZING    . Alirocumab (PRALUENT) 75 MG/ML SOPN Inject 75 mg into the skin every 14 (fourteen) days. 2 pen 11  . amLODipine (NORVASC) 10 MG tablet Take 10 mg by mouth daily.     Marland Kitchen anastrozole (ARIMIDEX) 1 MG tablet Take 1 tablet (1 mg total) by mouth daily. 90 tablet 4  . cetirizine (ZYRTEC) 10 MG tablet Take 10 mg by mouth daily as needed for allergies.   11  . levothyroxine (SYNTHROID, LEVOTHROID) 50 MCG tablet Take 50 mcg by mouth daily.    Marland Kitchen lidocaine-prilocaine (EMLA) cream Apply 1 application topically as needed (port access). 30 g 0  . losartan-hydrochlorothiazide (HYZAAR) 100-12.5 MG tablet Take 1 tablet by mouth daily.   3  . metoprolol succinate (TOPROL XL) 25 MG 24 hr tablet Take 3 tablets (75 mg total) by mouth daily. 270 tablet 3  . mometasone-formoterol (DULERA) 100-5 MCG/ACT AERO Inhale 2 puffs into the lungs 2 (two) times daily. 3 Inhaler 0  . nitroGLYCERIN (NITROSTAT) 0.4 MG SL tablet Place 1 tablet (0.4 mg total) under the tongue every 5 (five) minutes as needed for chest pain. 75 tablet 3  . potassium chloride (K-DUR) 10  MEQ tablet TAKE 2 TABLETS(20 MEQ) BY MOUTH DAILY 60 tablet 0  . aspirin EC 81 MG tablet Take 1 tablet (81 mg total) by mouth daily. 90 tablet 3  . doxycycline (VIBRAMYCIN) 100 MG capsule Take 1 capsule (100 mg total) by mouth 2 (two) times daily for 7 days. 14 capsule 0  . nicotine (NICODERM CQ - DOSED IN MG/24 HOURS) 14 mg/24hr patch Place 1 patch (14 mg total) onto the skin daily. 28 patch 0   No current facility-administered medications for this encounter.    BP 130/80   Pulse 89   Wt 71.8 kg (158 lb 6.4 oz)  SpO2 96%   BMI 28.06 kg/m   General: NAD Neck: No JVD, no thyromegaly or thyroid nodule.  Lungs: Distant BS.  CV: Nondisplaced PMI.  Heart regular S1/S2, no S3/S4, no murmur.  No peripheral edema.  No carotid bruit.  Normal pedal pulses.  Abdomen: Soft, nontender, no hepatosplenomegaly, no distention.  Skin: Intact without lesions or rashes.  Neurologic: Alert and oriented x 3.  Psych: Normal affect. Extremities: No clubbing or cyanosis.  HEENT: Normal.   Assessment/Plan: 1. Smoking: I encouraged her again to quit.  - She will try nicotine patches.  I told her that if this did not work, I would give her a prescription for Chantix or bupropion.  2. HTN: BP controlled on current regimen. 3. Hyperlipidemia: Continue Praluent. 4. Cardiomyopathy: Last echo in 12/19 showed EF mildly decreased at 50-55% with lateral wall motion abnormalities.  I do no think that this is due to Herceptin, think this is likely an ischemic cardiomyopathy related to circumflex coronary disease.   - Continue losartan 100 mg daily.  - Continue Toprol XL 75 mg daily.  - I think that she can continue Herceptin.  5. Breast cancer: Plan now is for indefinite Herceptin. As above, suspect decreased EF is due to CAD and not Herceptin.  Ok to continue Herceptin.  6. CAD: Significant CAD on coronary CTA in 12/19.  The mid LCx appears occluded and there appears to be a severe stenosis in the PDA (unable to assess  PDA by FFR).  She does not have any chest pain though she does have dyspnea at times.  I suspect she had an MI causing the lateral wall motion abnormalities on echo (occlusion of LCx).   - Continue Praluent.  - She needs to start ASA 81 daily.  - Stop smoking as above.   - I offered her cardiac cath for coronary evaluation and potential PCI.  She is nervous about a cath and wants to think about it.  I do not think it is urgent.  I will see her back in a month.  If she wants the cath prior to then, she will call. If symptoms worsen, she knows to call.  She has NTG at home.  7. Pulmonary: Lung fields on coronary CT showed possible right lung infiltrate.  She has been coughing for 8-9 days.   - Doxycycline 100 mg bid x 7 days.  - Repeat CT chest in 3 months to make sure the right lung findings have resolved.   Followup in 1 month.   Loralie Champagne 01/04/2019

## 2019-01-04 NOTE — Patient Instructions (Signed)
START Aspirin 81 mg daily  START doxycyline 100mg  twice a day for 7 days  START Nicotine patch as ordered.   Your physician recommends that you schedule a follow-up appointment in: 1 month with ECHO  Your physician has requested that you have an echocardiogram. Echocardiography is a painless test that uses sound waves to create images of your heart. It provides your doctor with information about the size and shape of your heart and how well your heart's chambers and valves are working. This procedure takes approximately one hour. There are no restrictions for this procedure.  Your physician recommends a CT scan of chest in 3 months.  Test have been ordered. They will call you for an appointment.

## 2019-01-07 ENCOUNTER — Telehealth (HOSPITAL_COMMUNITY): Payer: Self-pay

## 2019-01-07 NOTE — Telephone Encounter (Signed)
-----   Message from [name], MD sent at 12/29/2018  8:26 PM EST ----- There appears to be significant coronary disease, suspect this explains low EF on echo.  Would recommend coronary angiography to assess.  We can go ahead and arrange for next week or can see her in office soon to discuss.

## 2019-01-07 NOTE — Telephone Encounter (Signed)
Notes recorded by Valeda Malm, RN on 01/07/2019 at 9:42 AM EST Pt was seen in office 01/04/19. Results and plan discussed during visit. ------  Notes recorded by Valeda Malm, RN on 12/30/2018 at 4:35 PM EST LM at patients home to return call to office regarding CT results.

## 2019-01-12 NOTE — Progress Notes (Deleted)
[place]  [phone] [phone]     ID: Tracie [name] DOB: [birth year]  MR#: [id number]  OXB#:[id number]  Patient Care Team: Bartholome Bill, MD as PCP - General (Family Medicine) [name], MD as Consulting Physician (General Surgery) Magrinat, [name], MD as Consulting Physician (Oncology) [name], MD as Attending Physician (Radiation Oncology) Mottinger, [place] DDS (Physical Therapy) [name], MD as Consulting Physician (Cardiology) [name], [name], NP as Nurse Practitioner (Hematology and Oncology) OTHER MD:  CHIEF COMPLAINT: Triple positive breast cancer  CURRENT TREATMENT: trastuzumab; anastrozole, (zolendronate)   HISTORY OF CURRENT ILLNESS: From the original intake note:  Tracie [name] noted a change in her right breast sometime in July 2018. [facility] to report that she had found a "kernel" in her breast. She was advised to see her primary physician which she did. The patient was then set up for bilateral diagnostic mammography with tomography and right breast ultrasonography at [facility] 09/03/2017. The breast density was category I a. In the upper outer quadrant of the right breast there was a 2.1 cm high density mass, which was palpable. Also in the right breast more posteriorly there was a 1.5 cm high density mass which was felt to be a suspicious lymph node. Ultrasound of the right breast confirmed a 2.1 centimeter lobulated solid mass in the right breast upper outer quadrant 15.8 cm from the nipple in the 10:00 radiant. There was a 1.5 cm oval mass in the right axillary tail with a small rounded masses also suspicious for lymph node involvement. A total of 3 suspicious lymph nodes were identified.  On 09/03/2017 the patient underwent biopsy of the breast mass and the suspicious right axillary lymph node. The final pathology ([id number]) found invasive ductal carcinoma, grade 3, in both.  Both tumors were estrogen receptor positive at 70-75%, and both were progesterone receptor negative. Both had an elevated proliferation marker at 90%. The mass in the breast was HER-2 negative, with a signals ratio of 1.25 and the number per cell 1.88. The lymph node mass however was HER-2 positive with a signals ratio of 2.57, and the number per cell 3.60.  The patient's subsequent history is as detailed below.  INTERVAL HISTORY: Tracie [name] returns today for follow-up and treatment of her triple positive breast cancer. She is accompanied by ***.  She continues on trastuzumab. ***  The patient also continues on anastrozole. ***  The patient also continues on zolendronate, repeated every 12 weeks. Her last dose was received on     She has not undergone a bone density screening.    Tracie [name]'s last echocardiogram on 12/06/2018, showed an ejection fraction in the 50% - 55% range.   Her last mammography was on *** at ***  Since *** last visit here, ***   {Check current treatment & assessment - Images, Labs, Media - Pathology, PET, CT, MRI, etc - Restaging Studies for Stage IV pts}  {Tracie [name] returns today for follow-up and treatment of her triple positive breast cancer. She receives trastuzumab every 21 days, with a dose due today. Her most recent echo was on 09/06/2018 and demonstrated a LVEF of 55-60%.    She continues on anastrozole, with good tolerance. She denies any hot flashes, vaginal dryness.  She has occasional arthralgias, and these are manageable for her.  }  REVIEW OF SYSTEMS: Tracie [name] ***. ***. ***.  The patient denies unusual headaches, visual changes, nausea, vomiting, or dizziness. There has been no  unusual cough, phlegm production, or pleurisy. This been no change in bowel or bladder habits. The patient denies unexplained fatigue or unexplained weight loss, bleeding, rash, or fever. A detailed review of systems was otherwise noncontributory.   {Tracie [name] recently underwent dental  surgery and is healing well.  She notes the last time she had a xray of the area it didn't appear to have healed.  She needs the bone in her mouth shaved, and says she cannot afford it at this point.    She notes that she has not been taking the Losartan HCT and wonders if we need to send in a refill.  Tracie [name] wants more information about her cancer and what type she has/had.  She denies any issues today with unusual headaches, vision changes, chest pain, palpitations, shortness of breath, cough, bowel bladder changes or any other issues.  A detailed ROS was otherwise non contributory.   }  PAST MEDICAL HISTORY: Past Medical History:  Diagnosis Date   Angina pectoris ([place])    history of    Arthritis    Asthma    Cancer ([place])    right breast   Constipation    Diverticulitis    History of radiation therapy 04/07/18- 05/11/18   50 Gy in 25 fractions to right breast and regional nodes with four fields (no boost)   History of radiation therapy 04/21/2018   T8 spine, 18 Gy in 1 fraction for a total dose of 18 Gy.    HPV in female    Hyperlipidemia    Hypertension    Hypothyroidism    Wears dentures    Wears glasses     PAST SURGICAL HISTORY: Past Surgical History:  Procedure Laterality Date   BREAST LUMPECTOMY WITH RADIOACTIVE SEED AND SENTINEL LYMPH NODE BIOPSY Right 03/08/2018   Procedure: RIGHT BREAST RADIOACTIVE SEED GUIDED LUMPECTOMY WITH RIGHT AXILLARY RADIOACTIVE SEED TARGETED LYMPH NODE EXCISION AND SENTINEL LYMPH NODE BIOPSY, INJECT BLUE DYE RIGHT BREAST;  Surgeon: [name], MD;  Location: [facility];  Service: General;  Laterality: Right;   CESAREAN SECTION     COLON SURGERY  2012   sigmoidectomy   COLOSTOMY     COLOSTOMY TAKEDOWN  10/06/11   IR BONE TUMOR(S)RF ABLATION  11/02/2017   IR KYPHO THORACIC WITH BONE BIOPSY  11/02/2017   IR RADIOLOGIST EVAL & MGMT  10/07/2017   IR RADIOLOGIST EVAL & MGMT  11/25/2017   PORTACATH PLACEMENT N/A 09/25/2017    Procedure: INSERTION PORT-A-CATH WITH ULTRA SOUND ERAS PATHWAY;  Surgeon: [name], MD;  Location: [place];  Service: General;  Laterality: N/A;   TONSILLECTOMY     TUBAL LIGATION      FAMILY HISTORY Family History  Problem Relation Age of Onset   Cancer Mother        rectal   Hypertension Brother    Hypertension Brother    Asthma Son    Asthma Brother    The patient has little information regarding her father. Her mother died at age 83 from a strange cancer--the patient does not know what it was, it may well have been cervical cancer from her description. The patient has one brother, no sisters. There is no history of breast or ovarian cancer in the family to the patient's knowledge   GYNECOLOGIC HISTORY:  No LMP recorded. Patient is postmenopausal.  menarche age 71, first live birth age 89 the patient is Tracie [name] P3. She stopped having periods at age 79. She never used oral contraceptives or  hormone replacement.   SOCIAL HISTORY:  Works as a Scientist, product/process development. She is divorced. Currently her son Tracie [name] lives with her. He is a Art gallery manager. The 2 other children are Tracie [name], who lives in [place] and works in the theater and media, and Tracie [name] lives in [place], and is vice president of a [employer]. The patient has 5 grandchildren. She is a Psychologist, forensic.    ADVANCED DIRECTIVES: Not in place    HEALTH MAINTENANCE: Social History   Tobacco Use   Smoking status: Current Some Day Smoker    Packs/day: 0.25    Years: 40.00    Pack years: 10.00    Types: Cigarettes   Smokeless tobacco: Never Used   Tobacco comment: she is smoking 2 or more daily, she plans to use nicotine patch.   Substance Use Topics   Alcohol use: Yes    Comment: occasional   Drug use: No     Colonoscopy: September 2012   PAP:  Bone density:09/03/2017    Allergies  Allergen Reactions   Fish Allergy Anaphylaxis and Swelling    Swelling of hands and  feet., non shellfish allergy   Other Anaphylaxis    Tree nuts    Ace Inhibitors Other (See Comments) and Cough    Cold like symptoms    Tylenol [Acetaminophen] Other (See Comments)    Headache     Current Outpatient Medications  Medication Sig Dispense Refill   albuterol (PROAIR HFA) 108 (90 Base) MCG/ACT inhaler INHALE 2 PUFFS BY MOUTH INTO THE LUNGS EVERY 6 HOURS AS NEEDED FOR WHEEZING     Alirocumab (PRALUENT) 75 MG/ML SOPN Inject 75 mg into the skin every 14 (fourteen) days. 2 pen 11   amLODipine (NORVASC) 10 MG tablet Take 10 mg by mouth daily.      anastrozole (ARIMIDEX) 1 MG tablet Take 1 tablet (1 mg total) by mouth daily. 90 tablet 4   aspirin EC 81 MG tablet Take 1 tablet (81 mg total) by mouth daily. 90 tablet 3   cetirizine (ZYRTEC) 10 MG tablet Take 10 mg by mouth daily as needed for allergies.   11   levothyroxine (SYNTHROID, LEVOTHROID) 50 MCG tablet Take 50 mcg by mouth daily.     lidocaine-prilocaine (EMLA) cream Apply 1 application topically as needed (port access). 30 g 0   losartan-hydrochlorothiazide (HYZAAR) 100-12.5 MG tablet Take 1 tablet by mouth daily.   3   metoprolol succinate (TOPROL XL) 25 MG 24 hr tablet Take 3 tablets (75 mg total) by mouth daily. 270 tablet 3   mometasone-formoterol (DULERA) 100-5 MCG/ACT AERO Inhale 2 puffs into the lungs 2 (two) times daily. 3 Inhaler 0   nicotine (NICODERM CQ - DOSED IN MG/24 HOURS) 14 mg/24hr patch Place 1 patch (14 mg total) onto the skin daily. 28 patch 0   nitroGLYCERIN (NITROSTAT) 0.4 MG SL tablet Place 1 tablet (0.4 mg total) under the tongue every 5 (five) minutes as needed for chest pain. 75 tablet 3   potassium chloride (K-DUR) 10 MEQ tablet TAKE 2 TABLETS(20 MEQ) BY MOUTH DAILY 60 tablet 0   No current facility-administered medications for this visit.     OBJECTIVE: Middle-aged African-American woman who appears well There were no vitals filed for this visit.   There is no height or weight  on file to calculate BMI.   Wt Readings from Last 3 Encounters:  01/04/19 158 lb 6.4 oz (71.8 kg)  12/06/18 159 lb 6.4  oz (72.3 kg)  10/26/18 158 lb 12.8 oz (72 kg)   ECOG FS:1  GENERAL: Patient is a well appearing female in no acute distress HEENT:  Sclerae anicteric.  Oropharynx clear and moist. No ulcerations or evidence of oropharyngeal candidiasis. Neck is supple.  NODES:  No cervical, supraclavicular, or axillary lymphadenopathy palpated.  BREAST EXAM:  Right breast s/p lumpectomy, radiation changes are noted, no nodules or masses, left breast without nodules, masses, or any other concerns.   LUNGS:  Clear to auscultation bilaterally.  No wheezes or rhonchi. HEART:  Regular rate and rhythm. No murmur appreciated. ABDOMEN:  Soft, nontender.  Positive, normoactive bowel sounds. No organomegaly palpated. MSK:  No focal spinal tenderness to palpation. Full range of motion bilaterally in the upper extremities. EXTREMITIES:  No peripheral edema.   SKIN:  Clear with no obvious rashes or skin changes. No nail dyscrasia. NEURO:  Nonfocal. Well oriented.  Appropriate affect.       LAB RESULTS:  CMP     Component Value Date/Time   NA 140 12/20/2018 0805   NA 138 12/24/2017 0752   K 3.5 12/20/2018 0805   K 3.3 (L) 12/24/2017 0752   CL 106 12/20/2018 0805   CO2 26 12/20/2018 0805   CO2 25 12/24/2017 0752   GLUCOSE 101 (H) 12/20/2018 0805   GLUCOSE 99 12/24/2017 0752   BUN 11 12/20/2018 0805   BUN 7.9 12/24/2017 0752   CREATININE 0.89 12/20/2018 0805   CREATININE 0.7 12/24/2017 0752   CALCIUM 9.4 12/20/2018 0805   CALCIUM 9.2 12/24/2017 0752   PROT 6.5 12/20/2018 0805   PROT 6.3 (L) 12/24/2017 0752   ALBUMIN 3.3 (L) 12/20/2018 0805   ALBUMIN 3.4 (L) 12/24/2017 0752   AST 13 (L) 12/20/2018 0805   AST 15 12/24/2017 0752   ALT 12 12/20/2018 0805   ALT 18 12/24/2017 0752   ALKPHOS 88 12/20/2018 0805   ALKPHOS 93 12/24/2017 0752   BILITOT <0.2 (L) 12/20/2018 0805   BILITOT  <0.22 12/24/2017 0752   GFRNONAA >60 12/20/2018 0805   GFRAA >60 12/20/2018 0805    No results found for: Ronnald Ramp, A1GS, A2GS, BETS, BETA2SER, GAMS, MSPIKE, SPEI  No results found for: Nils Pyle, [facility]  Lab Results  Component Value Date   WBC 3.3 (L) 12/20/2018   NEUTROABS 1.8 12/20/2018   HGB 11.1 (L) 12/20/2018   HCT 35.1 (L) 12/20/2018   MCV 90.9 12/20/2018   PLT 267 12/20/2018      Chemistry      Component Value Date/Time   NA 140 12/20/2018 0805   NA 138 12/24/2017 0752   K 3.5 12/20/2018 0805   K 3.3 (L) 12/24/2017 0752   CL 106 12/20/2018 0805   CO2 26 12/20/2018 0805   CO2 25 12/24/2017 0752   BUN 11 12/20/2018 0805   BUN 7.9 12/24/2017 0752   CREATININE 0.89 12/20/2018 0805   CREATININE 0.7 12/24/2017 0752      Component Value Date/Time   CALCIUM 9.4 12/20/2018 0805   CALCIUM 9.2 12/24/2017 0752   ALKPHOS 88 12/20/2018 0805   ALKPHOS 93 12/24/2017 0752   AST 13 (L) 12/20/2018 0805   AST 15 12/24/2017 0752   ALT 12 12/20/2018 0805   ALT 18 12/24/2017 0752   BILITOT <0.2 (L) 12/20/2018 0805   BILITOT <0.22 12/24/2017 0752       No results found for: LABCA2  No components found for: [id number]    No visits with results within 3  Day(s) from this visit.  Latest known visit with results is:  Appointment on 12/20/2018  Component Date Value Ref Range Status   Sodium 12/20/2018 140  135 - 145 mmol/L Final   Potassium 12/20/2018 3.5  3.5 - 5.1 mmol/L Final   Chloride 12/20/2018 106  98 - 111 mmol/L Final   CO2 12/20/2018 26  22 - 32 mmol/L Final   Glucose, Bld 12/20/2018 101* 70 - 99 mg/dL Final   BUN 12/20/2018 11  8 - 23 mg/dL Final   Creatinine 12/20/2018 0.89  0.44 - 1.00 mg/dL Final   Calcium 12/20/2018 9.4  8.9 - 10.3 mg/dL Final   Total Protein 12/20/2018 6.5  6.5 - 8.1 g/dL Final   Albumin 12/20/2018 3.3* 3.5 - 5.0 g/dL Final   AST 12/20/2018 13* 15 - 41 U/L Final   ALT 12/20/2018 12  0 - 44  U/L Final   Alkaline Phosphatase 12/20/2018 88  38 - 126 U/L Final   Total Bilirubin 12/20/2018 <0.2* 0.3 - 1.2 mg/dL Final   GFR, Est Non Af Am 12/20/2018 >60  >60 mL/min Final   GFR, Est AFR Am 12/20/2018 >60  >60 mL/min Final   Anion gap 12/20/2018 8  5 - 15 Final   Performed at [facility], [place] [address], [place], [place] [address]   WBC Count 12/20/2018 3.3* 4.0 - 10.5 K/uL Final   RBC 12/20/2018 3.86* 3.87 - 5.11 MIL/uL Final   Hemoglobin 12/20/2018 11.1* 12.0 - 15.0 g/dL Final   HCT 12/20/2018 35.1* 36.0 - 46.0 % Final   MCV 12/20/2018 90.9  80.0 - 100.0 fL Final   MCH 12/20/2018 28.8  26.0 - 34.0 pg Final   MCHC 12/20/2018 31.6  30.0 - 36.0 g/dL Final   RDW 12/20/2018 17.1* 11.5 - 15.5 % Final   Platelet Count 12/20/2018 267  150 - 400 K/uL Final   nRBC 12/20/2018 0.0  0.0 - 0.2 % Final   Neutrophils Relative % 12/20/2018 56  % Final   Neutro Abs 12/20/2018 1.8  1.7 - 7.7 K/uL Final   Lymphocytes Relative 12/20/2018 23  % Final   Lymphs Abs 12/20/2018 0.8  0.7 - 4.0 K/uL Final   Monocytes Relative 12/20/2018 13  % Final   Monocytes Absolute 12/20/2018 0.4  0.1 - 1.0 K/uL Final   Eosinophils Relative 12/20/2018 7  % Final   Eosinophils Absolute 12/20/2018 0.2  0.0 - 0.5 K/uL Final   Basophils Relative 12/20/2018 0  % Final   Basophils Absolute 12/20/2018 0.0  0.0 - 0.1 K/uL Final   Immature Granulocytes 12/20/2018 1  % Final   Abs Immature Granulocytes 12/20/2018 0.02  0.00 - 0.07 K/uL Final   Performed at [facility], [place] [address] [place],  [address]    (this displays the last labs from the last 3 days)  No results found for: TOTALPROTELP, ALBUMINELP, A1GS, A2GS, BETS, BETA2SER, GAMS, MSPIKE, SPEI (this displays SPEP labs)  No results found for: KPAFRELGTCHN, LAMBDASER, KAPLAMBRATIO (kappa/lambda light chains)  No results found for: HGBA, HGBA2QUANT, HGBFQUANT,  HGBSQUAN (Hemoglobinopathy evaluation)   No results found for: LDH  No results found for: IRON, TIBC, IRONPCTSAT (Iron and TIBC)  No results found for: FERRITIN  Urinalysis    Component Value Date/Time   LABSPEC 1.020 02/10/2011 2058   PHURINE 6.0 02/10/2011 2058   [place] (A) 02/10/2011 2058   BILIRUBINUR MODERATE (A) 02/10/2011 2058   KETONESUR 15 (A) 02/10/2011 5465  PROTEINUR >=300 (A) 02/10/2011 2058   UROBILINOGEN >=8.0 02/10/2011 2058   NITRITE NEGATIVE 02/10/2011 2058   LEUKOCYTESUR  02/10/2011 2058    NEGATIVE Biochemical Testing Only. Please order routine urinalysis from main lab if confirmatory testing is needed.     STUDIES: Ct Coronary Morph W/cta Cor W/score W/ca W/cm &/or Wo/cm  Addendum Date: 12/28/2018   ADDENDUM REPORT: 12/28/2018 16:21 CLINICAL DATA:  Chest pain EXAM: Cardiac CTA MEDICATIONS: Sub lingual nitro. 40m x 2 TECHNIQUE: The patient was scanned on a Siemens 1161slice scanner. Gantry rotation speed was 250 msecs. Collimation was 0.6 mm. A 100 kV prospective scan was triggered in the ascending thoracic aorta at 35-75% of the R-R interval. Average HR during the scan was 60 bpm. The 3D data set was interpreted on a dedicated work station using MPR, MIP and VRT modes. A total of 80cc of contrast was used. FINDINGS: Non-cardiac: [facility]. Pulmonary veins drain normally to the left atrium. Calcium Score: 128 Agatston units. Coronary Arteries: Right dominant with no anomalies LM: Calcified plaque at ostial left main without significant stenosis. LAD system: Possible severe (70-90%) stenosis in the mid LAD just proximal to take-off of D1. Moderate D1 with possible moderate (51-69%) mid vessel stenosis. Circumflex system: Small to moderate ramus without significant disease. The LCx is occluded at the mid-vessel. The distal LCx appears to fill by collaterals. RCA system: Mixed plaque proximal RCA and mid RCA, suspect mild (<50%)  stenosis. The ostial PDA appears subtotally occluded. IMPRESSION: 1. Coronary artery calcium score 128 Agatston units, placing the patient in the 82nd percentile for age and gender. This suggests high risk for future cardiac events. 2.  The ostial PDA appears subtotally occluded. 3. The mid LCx is occluded, there appear to be collaterals to distal LCx. 2.  Possible severe mid LAD and moderate mid D1 stenosis. Will send for FFR. Dalton Mclean Electronically Signed   By: DLoralie ChampagneM.D.   On: 12/28/2018 16:21   Result Date: 12/28/2018 EXAM: OVER-READ INTERPRETATION  CT CHEST The following report is an over-read performed by radiologist Dr. KRolm Baptiseof [facility], P[place]ryon 12/28/2018. This over-read does not include interpretation of cardiac or coronary anatomy or pathology. The coronary CTA interpretation by the cardiologist is attached. COMPARISON:  07/30/2018 FINDINGS: Vascular: Heart is borderline in size. Visualized aorta normal caliber. Atherosclerotic calcifications in the descending thoracic aorta. Mediastinum/Nodes: No adenopathy in the lower mediastinum or hila. Lungs/Pleura: New somewhat nodular appearing airspace disease in the right lung along the right minor fissure, new since 07/30/2018. No effusions. Upper Abdomen: Imaging into the upper abdomen shows no acute findings. Musculoskeletal: Chest wall soft tissues are unremarkable. No acute bony abnormality. IMPRESSION: New nodular appearing airspace disease in the right lung along the minor fissure. Given the relative rapid appearance, favor an infectious or inflammatory process. This could be followed with repeat CT in 3 months or following antibiotic treatment. Electronically Signed: By: KRolm BaptiseM.D. On: 12/28/2018 11:37   Ct Coronary Fractional Flow Reserve Fluid Analysis  Result Date: 12/29/2018 CLINICAL DATA:  Chest pain EXAM: CT FFR MEDICATIONS: No additional medications. TECHNIQUE: The coronary CT angiogram was sent for FFR.  FINDINGS: 0.88 distal RCA.  PDA was not evaluated. 0.81 mid LAD after D1. 0.82 D1 Occluded mid-distal LCx. IMPRESSION: 1. PDA not evaluated (appeared to have severe stenosis on CT angiogram. 2.  Occluded mid-distal LCx. 3. The LAD does not appear to have hemodynamically significant stenosis. Dalton [employer]  Electronically Signed   By: Loralie Champagne M.D.   On: 12/29/2018 20:24    ELIGIBLE FOR AVAILABLE RESEARCH PROTOCOL: no  ASSESSMENT: 68 y.o. [place] woman status post right breast upper outer quadrant and right axillary lymph node biopsy 09/03/2017, both positive for invasive ductal carcinoma, grade 3, estrogen receptor positive, progesterone receptor negative, with an MIB-1 of 90%, the lymph node being HER-2 positive, the breast mass HER-2 negative  (a) staging studies September 24, 2017 show a lytic lesion in T8, possible areas of concern at L1-L2  (b) biopsy/kyphoplasty/osteocool of T8 lesion 11/02/2017 confirms metastatic adenocarcinoma, 20% estrogen receptor positive, estrogen receptor negative  (c) PET scan 11/30/2017 shows no visceral disease, minimal metabolic activity at T8, no other bone lesions  (1) neoadjuvant chemotherapy with carboplatin, docetaxel, trastuzumab, and pertuzumab started 10/01/2017, completed 6 cycles 01/26/2018  (a) Docetaxel dropped after three cycles and Gemcitabine/Carbo started on 12/15/2017.   (2) trastuzumab to continue indefinitely  (a) echocardiogram 11/38/2018 shows an ejection fraction in the 55-60% range  (b) echocardiogram 03/01/2018 shows an ejection fraction in the 60-65% range  (c) echocardiogram 06/04/2018 shows an EF of 60-65%  (d) Echocardiogram on 09/06/18 shows EF of 55-60%  (3) right lumpectomy and sentinel lymph node sampling 03/08/2018  Showed a pT1(mic) pN0 invasive ductal carcinoma, grade 2, with negative margins.  (4) adjuvant radiation  04-07-18 to 05-11-18                                              (a) SITE/DOSE:  Right Breast and  Axilla, SCV nodes / 50 Gy in 25 fx                (b) radiation to her oligo metastatic disease at T8 (18 Gy) in 1 fraction, 04/21/2018  (5) anastrozole started 05/29/2018  (a) bone density test not yet done  (6) zolendronate to start NOV 2019, to be repeated every 12 weeks (delayed due to dental extractions/work)  PLAN:  Tracie [name] is doing well today.  I reviewed with her breast cancer with her in detail and gave her info in her AVS about her triple positivity.  She will continue taking Anastrozole and will continue on Trastuzumab every 3 weeks until her appointment on 10/26/18.  At that time, I will go through a [place] with her which will give her more details about her breast cancer.  She will also transition to Trastuzumab every 4 weeks at that point.    We also reviewed her labs today which for the most part are stable.  I did note her kidney function is slightly elevated so I recommended increased fluid intake.  She agrees she could do better with drinking water. I recommended that if she has not been taking the Losartan HCT for the past few months, and she has remained normotensive, then she should stop taking the medication.  She has been normotensive in our office, and has had no issues with this. We reviewed this in detail.    Tracie [name] understands the above plan and is in agreement with this.  We will continue to hold the zolendronate until she has had a little bit more time to heal from her dental work, and will readdress at her 10/26/18 visit.  Tracie [name] knows to call for any questions or concerns prior to her next appointment with [place].    A total  of (30) minutes of face-to-face time was spent with this patient with greater than 50% of that time in counseling and care-coordination.    Magrinat, [name], MD  01/12/19 3:29 PM Medical Oncology and Hematology [facility] [address] [place],  [address] Tel. [phone]    Fax.  [phone]  I, Jacqualyn Posey am acting as a Education administrator for Chauncey Cruel, MD.   {Add scribe attestation statement}

## 2019-01-18 ENCOUNTER — Ambulatory Visit: Payer: 59 | Admitting: Oncology

## 2019-01-18 ENCOUNTER — Other Ambulatory Visit: Payer: 59

## 2019-01-18 ENCOUNTER — Ambulatory Visit: Payer: 59

## 2019-01-18 NOTE — Progress Notes (Addendum)
[place]  [phone] [phone]     ID: Tracie [name] DOB: Nov 15, 1951  MR#: [id number]  KCM#:[id number]  Patient Care Team: Bartholome Bill, MD as PCP - General (Family Medicine) [name], MD as Consulting Physician (General Surgery) Magrinat, [name], MD as Consulting Physician (Oncology) [name], MD as Attending Physician (Radiation Oncology) Mottinger, [place] DDS (Physical Therapy) [name], MD as Consulting Physician (Cardiology) [name], [name], NP as Nurse Practitioner (Hematology and Oncology) OTHER MD:   CHIEF COMPLAINT: Triple positive breast cancer  CURRENT TREATMENT: trastuzumab; anastrozole, (zolendronate)   HISTORY OF CURRENT ILLNESS: From the original intake note:  Tracie [name] noted a change in her right breast sometime in July 2018. [facility] called the [facility] to report that she had found a "kernel" in her breast. She was advised to see her primary physician which she did. The patient was then set up for bilateral diagnostic mammography with tomography and right breast ultrasonography at [facility] 09/03/2017. The breast density was category I a. In the upper outer quadrant of the right breast there was a 2.1 cm high density mass, which was palpable. Also in the right breast more posteriorly there was a 1.5 cm high density mass which was felt to be a suspicious lymph node. Ultrasound of the right breast confirmed a 2.1 centimeter lobulated solid mass in the right breast upper outer quadrant 15.8 cm from the nipple in the 10:00 radiant. There was a 1.5 cm oval mass in the right axillary tail with a small rounded masses also suspicious for lymph node involvement. A total of 3 suspicious lymph nodes were identified.  On 09/03/2017 the patient underwent biopsy of the breast mass and the suspicious right axillary lymph node. The final pathology ([id number]) found invasive ductal carcinoma, grade 3, in  both. Both tumors were estrogen receptor positive at 70-75%, and both were progesterone receptor negative. Both had an elevated proliferation marker at 90%. The mass in the breast was HER-2 negative, with a signals ratio of 1.25 and the number per cell 1.88. The lymph node mass however was HER-2 positive with a signals ratio of 2.57, and the number per cell 3.60.  The patient's subsequent history is as detailed below.  INTERVAL HISTORY: Tracie [name] returns today for follow-up and treatment of her triple positive breast cancer.   She continues on trastuzumab, which she receives every 4 weeks. She tolerates this well and without any noticeable side effects.    The patient also continues on anastrozole.    Finally, the patient's zoledronate has been held because of her dental work. She last had two teeth pulled for than 6 months ago.  She is not anticipating any further dental work for another 6 months at least.  She will receive a dose of zoledronate today.   Dontasia's last echocardiogram on 12/06/2018, showed an ejection fraction in the 50% - 55% range.  She is already scheduled for her next echocardiogram, 02/02/2019.  She was last staged in August 2019 with a CT of the chest, bone scan, and brain MRI, all showing stable/no active disease.   REVIEW OF SYSTEMS: Corlis states that she has had watery eyes for a while. She recently completed a week regimen of doxycycline for sinus issues. She has an echocardiogram scheduled for 02/03/2019. She has cataracts surgery in February 2020. She has had no issus with her port. The patient denies unusual headaches, visual changes, nausea, vomiting, or dizziness. There has been no unusual cough,  phlegm production, or pleurisy. This been no change in bowel or bladder habits. The patient denies unexplained fatigue or unexplained weight loss, bleeding, rash, or fever. A detailed review of systems was otherwise noncontributory.    PAST MEDICAL HISTORY: Past Medical  History:  Diagnosis Date  . Angina pectoris ([place])    history of   . Arthritis   . Asthma   . Cancer ([place])    right breast  . Constipation   . Diverticulitis   . History of radiation therapy 04/07/18- 05/11/18   50 Gy in 25 fractions to right breast and regional nodes with four fields (no boost)  . History of radiation therapy 04/21/2018   T8 spine, 18 Gy in 1 fraction for a total dose of 18 Gy.   Marland Kitchen HPV in female   . Hyperlipidemia   . Hypertension   . Hypothyroidism   . Wears dentures   . Wears glasses     PAST SURGICAL HISTORY: Past Surgical History:  Procedure Laterality Date  . BREAST LUMPECTOMY WITH RADIOACTIVE SEED AND SENTINEL LYMPH NODE BIOPSY Right 03/08/2018   Procedure: RIGHT BREAST RADIOACTIVE SEED GUIDED LUMPECTOMY WITH RIGHT AXILLARY RADIOACTIVE SEED TARGETED LYMPH NODE EXCISION AND SENTINEL LYMPH NODE BIOPSY, INJECT BLUE DYE RIGHT BREAST;  Surgeon: [name], MD;  Location: [facility];  Service: General;  Laterality: Right;  . CESAREAN SECTION    . COLON SURGERY  2012   sigmoidectomy  . COLOSTOMY    . COLOSTOMY TAKEDOWN  10/06/11  . IR BONE TUMOR(S)RF ABLATION  11/02/2017  . IR KYPHO THORACIC WITH BONE BIOPSY  11/02/2017  . IR RADIOLOGIST EVAL & MGMT  10/07/2017  . IR RADIOLOGIST EVAL & MGMT  11/25/2017  . PORTACATH PLACEMENT N/A 09/25/2017   Procedure: INSERTION PORT-A-CATH WITH ULTRA SOUND ERAS PATHWAY;  Surgeon: [name], MD;  Location: [facility];  Service: General;  Laterality: N/A;  . TONSILLECTOMY    . TUBAL LIGATION      FAMILY HISTORY Family History  Problem Relation Age of Onset  . Cancer Mother        rectal  . Hypertension Brother   . Hypertension Brother   . Asthma Son   . Asthma Brother    The patient has little information regarding her father. Her mother died at age 60 from a strange cancer--the patient does not know what it was, it may well have been cervical cancer from her description. The patient has one brother, no sisters. There is  no history of breast or ovarian cancer in the family to the patient's knowledge    GYNECOLOGIC HISTORY:  No LMP recorded. Patient is postmenopausal.  menarche age 50, first live birth age 19 the patient is [place] P3. She stopped having periods at age 16. She never used oral contraceptives or hormone replacement.    SOCIAL HISTORY:  Works as a Scientist, product/process development. She is divorced. Currently her son [name] lives with her. He is a Art gallery manager. The 2 other children are TENICIA GURAL, who lives in [place] and works in the theater and media, and Elease Swarm lives in [place], and is vice president of a [employer]. The patient has 5 grandchildren. She is a Psychologist, forensic.   ADVANCED DIRECTIVES: Not in place    HEALTH MAINTENANCE: Social History   Tobacco Use  . Smoking status: Current Some Day Smoker    Packs/day: 0.25    Years: 40.00    Pack years: 10.00    Types:  Cigarettes  . Smokeless tobacco: Never Used  . Tobacco comment: she is smoking 2 or more daily, she plans to use nicotine patch.   Substance Use Topics  . Alcohol use: Yes    Comment: occasional  . Drug use: No    Colonoscopy: September 2012   PAP:  Bone density:09/03/2017    Allergies  Allergen Reactions  . Fish Allergy Anaphylaxis and Swelling    Swelling of hands and feet., non shellfish allergy  . Other Anaphylaxis    Tree nuts   . Ace Inhibitors Other (See Comments) and Cough    Cold like symptoms   . Tylenol [Acetaminophen] Other (See Comments)    Headache     Current Outpatient Medications  Medication Sig Dispense Refill  . albuterol (PROAIR HFA) 108 (90 Base) MCG/ACT inhaler INHALE 2 PUFFS BY MOUTH INTO THE LUNGS EVERY 6 HOURS AS NEEDED FOR WHEEZING    . Alirocumab (PRALUENT) 75 MG/ML SOPN Inject 75 mg into the skin every 14 (fourteen) days. 2 pen 11  . amLODipine (NORVASC) 10 MG tablet Take 10 mg by mouth daily.     Marland Kitchen anastrozole (ARIMIDEX) 1 MG tablet Take 1 tablet (1 mg total)  by mouth daily. 90 tablet 4  . aspirin EC 81 MG tablet Take 1 tablet (81 mg total) by mouth daily. 90 tablet 3  . cetirizine (ZYRTEC) 10 MG tablet Take 10 mg by mouth daily as needed for allergies.   11  . levothyroxine (SYNTHROID, LEVOTHROID) 50 MCG tablet Take 50 mcg by mouth daily.    Marland Kitchen lidocaine-prilocaine (EMLA) cream Apply 1 application topically as needed (port access). 30 g 0  . losartan-hydrochlorothiazide (HYZAAR) 100-12.5 MG tablet Take 1 tablet by mouth daily.   3  . metoprolol succinate (TOPROL XL) 25 MG 24 hr tablet Take 3 tablets (75 mg total) by mouth daily. 270 tablet 3  . mometasone-formoterol (DULERA) 100-5 MCG/ACT AERO Inhale 2 puffs into the lungs 2 (two) times daily. 3 Inhaler 0  . nicotine (NICODERM CQ - DOSED IN MG/24 HOURS) 14 mg/24hr patch Place 1 patch (14 mg total) onto the skin daily. 28 patch 0  . nitroGLYCERIN (NITROSTAT) 0.4 MG SL tablet Place 1 tablet (0.4 mg total) under the tongue every 5 (five) minutes as needed for chest pain. 75 tablet 3  . potassium chloride (K-DUR) 10 MEQ tablet TAKE 2 TABLETS(20 MEQ) BY MOUTH DAILY 60 tablet 0   No current facility-administered medications for this visit.     OBJECTIVE: Middle-aged African-American woman no acute distress  Vitals:   01/19/19 0957  BP: (!) 113/56  Pulse: 76  Resp: 18  Temp: 98.1 F (36.7 C)  SpO2: 98%     Body mass index is 27.99 kg/m.   Wt Readings from Last 3 Encounters:  01/19/19 158 lb (71.7 kg)  01/04/19 158 lb 6.4 oz (71.8 kg)  12/06/18 159 lb 6.4 oz (72.3 kg)   ECOG FS:1  Sclerae unicteric, EOMs intact Oropharynx clear and moist, no loose teeth noted No cervical or supraclavicular adenopathy Lungs no rales or rhonchi Heart regular rate and rhythm Abd soft, nontender, positive bowel sounds MSK no focal spinal tenderness, no upper extremity lymphedema Neuro: nonfocal, well oriented, appropriate affect Breasts: I do not palpate any suspicious masses in either breast; the right  breast is status post radiation and lumpectomy, with some hyperpigmentation and skin coarsening as a result.  Both axillae are benign.    LAB RESULTS:  CMP  Component Value Date/Time   NA 140 01/19/2019 0933   NA 138 12/24/2017 0752   K 3.3 (L) 01/19/2019 0933   K 3.3 (L) 12/24/2017 0752   CL 102 01/19/2019 0933   CO2 28 01/19/2019 0933   CO2 25 12/24/2017 0752   GLUCOSE 108 (H) 01/19/2019 0933   GLUCOSE 99 12/24/2017 0752   BUN 13 01/19/2019 0933   BUN 7.9 12/24/2017 0752   CREATININE 0.86 01/19/2019 0933   CREATININE 0.7 12/24/2017 0752   CALCIUM 10.0 01/19/2019 0933   CALCIUM 9.2 12/24/2017 0752   PROT 7.0 01/19/2019 0933   PROT 6.3 (L) 12/24/2017 0752   ALBUMIN 3.5 01/19/2019 0933   ALBUMIN 3.4 (L) 12/24/2017 0752   AST 43 (H) 01/19/2019 0933   AST 15 12/24/2017 0752   ALT 91 (H) 01/19/2019 0933   ALT 18 12/24/2017 0752   ALKPHOS 94 01/19/2019 0933   ALKPHOS 93 12/24/2017 0752   BILITOT <0.2 (L) 01/19/2019 0933   BILITOT <0.22 12/24/2017 0752   GFRNONAA >60 01/19/2019 0933   GFRAA >60 01/19/2019 0933    No results found for: Ronnald Ramp, A1GS, A2GS, BETS, BETA2SER, GAMS, MSPIKE, SPEI  No results found for: Nils Pyle, [facility]  Lab Results  Component Value Date   WBC 2.9 (L) 01/19/2019   NEUTROABS 1.4 (L) 01/19/2019   HGB 12.0 01/19/2019   HCT 36.9 01/19/2019   MCV 89.3 01/19/2019   PLT 260 01/19/2019      Chemistry      Component Value Date/Time   NA 140 01/19/2019 0933   NA 138 12/24/2017 0752   K 3.3 (L) 01/19/2019 0933   K 3.3 (L) 12/24/2017 0752   CL 102 01/19/2019 0933   CO2 28 01/19/2019 0933   CO2 25 12/24/2017 0752   BUN 13 01/19/2019 0933   BUN 7.9 12/24/2017 0752   CREATININE 0.86 01/19/2019 0933   CREATININE 0.7 12/24/2017 0752      Component Value Date/Time   CALCIUM 10.0 01/19/2019 0933   CALCIUM 9.2 12/24/2017 0752   ALKPHOS 94 01/19/2019 0933   ALKPHOS 93 12/24/2017 0752   AST 43 (H)  01/19/2019 0933   AST 15 12/24/2017 0752   ALT 91 (H) 01/19/2019 0933   ALT 18 12/24/2017 0752   BILITOT <0.2 (L) 01/19/2019 0933   BILITOT <0.22 12/24/2017 0752       No results found for: LABCA2  No components found for: [id number]    Appointment on 01/19/2019  Component Date Value Ref Range Status  . WBC Count 01/19/2019 2.9* 4.0 - 10.5 K/uL Final  . RBC 01/19/2019 4.13  3.87 - 5.11 MIL/uL Final  . Hemoglobin 01/19/2019 12.0  12.0 - 15.0 g/dL Final  . HCT 01/19/2019 36.9  36.0 - 46.0 % Final  . MCV 01/19/2019 89.3  80.0 - 100.0 fL Final  . MCH 01/19/2019 29.1  26.0 - 34.0 pg Final  . MCHC 01/19/2019 32.5  30.0 - 36.0 g/dL Final  . RDW 01/19/2019 17.2* 11.5 - 15.5 % Final  . Platelet Count 01/19/2019 260  150 - 400 K/uL Final  . nRBC 01/19/2019 0.0  0.0 - 0.2 % Final  . Neutrophils Relative % 01/19/2019 46  % Final  . Neutro Abs 01/19/2019 1.4* 1.7 - 7.7 K/uL Final  . Lymphocytes Relative 01/19/2019 29  % Final  . Lymphs Abs 01/19/2019 0.8  0.7 - 4.0 K/uL Final  . Monocytes Relative 01/19/2019 13  % Final  . Monocytes Absolute 01/19/2019 0.4  0.1 - 1.0 K/uL Final  . Eosinophils Relative 01/19/2019 10  % Final  . Eosinophils Absolute 01/19/2019 0.3  0.0 - 0.5 K/uL Final  . Basophils Relative 01/19/2019 1  % Final  . Basophils Absolute 01/19/2019 0.0  0.0 - 0.1 K/uL Final  . Immature Granulocytes 01/19/2019 1  % Final  . Abs Immature Granulocytes 01/19/2019 0.02  0.00 - 0.07 K/uL Final   Performed at [facility], [place] [address], [place], [place] [address]  . Sodium 01/19/2019 140  135 - 145 mmol/L Final  . Potassium 01/19/2019 3.3* 3.5 - 5.1 mmol/L Final  . Chloride 01/19/2019 102  98 - 111 mmol/L Final  . CO2 01/19/2019 28  22 - 32 mmol/L Final  . Glucose, Bld 01/19/2019 108* 70 - 99 mg/dL Final  . BUN 01/19/2019 13  8 - 23 mg/dL Final  . Creatinine 01/19/2019 0.86  0.44 - 1.00 mg/dL Final  . Calcium 01/19/2019 10.0  8.9 - 10.3 mg/dL Final  .  Total Protein 01/19/2019 7.0  6.5 - 8.1 g/dL Final  . Albumin 01/19/2019 3.5  3.5 - 5.0 g/dL Final  . AST 01/19/2019 43* 15 - 41 U/L Final  . ALT 01/19/2019 91* 0 - 44 U/L Final  . Alkaline Phosphatase 01/19/2019 94  38 - 126 U/L Final  . Total Bilirubin 01/19/2019 <0.2* 0.3 - 1.2 mg/dL Final  . GFR, Est Non Af Am 01/19/2019 >60  >60 mL/min Final  . GFR, Est AFR Am 01/19/2019 >60  >60 mL/min Final  . Anion gap 01/19/2019 10  5 - 15 Final   Performed at [facility], [address] [place], [place] [address]    (this displays the last labs from the last 3 days)  No results found for: TOTALPROTELP, ALBUMINELP, A1GS, A2GS, BETS, BETA2SER, GAMS, MSPIKE, SPEI (this displays SPEP labs)  No results found for: KPAFRELGTCHN, LAMBDASER, KAPLAMBRATIO (kappa/lambda light chains)  No results found for: HGBA, HGBA2QUANT, HGBFQUANT, HGBSQUAN (Hemoglobinopathy evaluation)   No results found for: LDH  No results found for: IRON, TIBC, IRONPCTSAT (Iron and TIBC)  No results found for: FERRITIN  Urinalysis    Component Value Date/Time   LABSPEC 1.020 02/10/2011 2058   PHURINE 6.0 02/10/2011 2058   HGBUR LARGE (A) 02/10/2011 2058   BILIRUBINUR MODERATE (A) 02/10/2011 2058   KETONESUR 15 (A) 02/10/2011 2058   PROTEINUR >=300 (A) 02/10/2011 2058   UROBILINOGEN >=8.0 02/10/2011 2058   NITRITE NEGATIVE 02/10/2011 2058   LEUKOCYTESUR  02/10/2011 2058    NEGATIVE Biochemical Testing Only. Please order routine urinalysis from main lab if confirmatory testing is needed.     STUDIES:  Most recent mammogram at [facility] was 12/16/2018, showing a breast density of category A.  There was no evidence of malignancy.  Ct Coronary Morph W/cta Cor W/score W/ca W/cm &/or Wo/cm  Addendum Date: 12/28/2018   ADDENDUM REPORT: 12/28/2018 16:21 CLINICAL DATA:  Chest pain EXAM: Cardiac CTA MEDICATIONS: Sub lingual nitro. 62m x 2 TECHNIQUE: The patient was scanned on a Siemens 1465slice  scanner. Gantry rotation speed was 250 msecs. Collimation was 0.6 mm. A 100 kV prospective scan was triggered in the ascending thoracic aorta at 35-75% of the R-R interval. Average HR during the scan was 60 bpm. The 3D data set was interpreted on a dedicated work station using MPR, MIP and VRT modes. A total of 80cc of contrast was used. FINDINGS: [facility]-cardiac: [facility] Pulmonary veins drain  normally to the left atrium. Calcium Score: 128 Agatston units. Coronary Arteries: Right dominant with no anomalies LM: Calcified plaque at ostial left main without significant stenosis. LAD system: Possible severe (70-90%) stenosis in the mid LAD just proximal to take-off of D1. Moderate D1 with possible moderate (51-69%) mid vessel stenosis. Circumflex system: Small to moderate ramus without significant disease. The LCx is occluded at the mid-vessel. The distal LCx appears to fill by collaterals. RCA system: Mixed plaque proximal RCA and mid RCA, suspect mild (<50%) stenosis. The ostial PDA appears subtotally occluded. IMPRESSION: 1. Coronary artery calcium score 128 Agatston units, placing the patient in the 82nd percentile for age and gender. This suggests high risk for future cardiac events. 2.  The ostial PDA appears subtotally occluded. 3. The mid LCx is occluded, there appear to be collaterals to distal LCx. 2.  Possible severe mid LAD and moderate mid D1 stenosis. Will send for FFR. Dalton Mclean Electronically Signed   By: Loralie Champagne M.D.   On: 12/28/2018 16:21   Result Date: 12/28/2018 EXAM: OVER-READ INTERPRETATION  CT CHEST The following report is an over-read performed by radiologist Dr. Rolm Baptise of [facility], [place] on 12/28/2018. This over-read does not include interpretation of cardiac or coronary anatomy or pathology. The coronary CTA interpretation by the cardiologist is attached. COMPARISON:  07/30/2018 FINDINGS: Vascular: Heart is borderline in size.  Visualized aorta normal caliber. Atherosclerotic calcifications in the descending thoracic aorta. Mediastinum/Nodes: No adenopathy in the lower mediastinum or hila. Lungs/Pleura: New somewhat nodular appearing airspace disease in the right lung along the right minor fissure, new since 07/30/2018. No effusions. Upper Abdomen: Imaging into the upper abdomen shows no acute findings. Musculoskeletal: Chest wall soft tissues are unremarkable. No acute bony abnormality. IMPRESSION: New nodular appearing airspace disease in the right lung along the minor fissure. Given the relative rapid appearance, favor an infectious or inflammatory process. This could be followed with repeat CT in 3 months or following antibiotic treatment. Electronically Signed: By: Rolm Baptise M.D. On: 12/28/2018 11:37   Ct Coronary Fractional Flow Reserve Fluid Analysis  Result Date: 12/29/2018 CLINICAL DATA:  Chest pain EXAM: CT FFR MEDICATIONS: No additional medications. TECHNIQUE: The coronary CT angiogram was sent for FFR. FINDINGS: 0.88 distal RCA.  PDA was not evaluated. 0.81 mid LAD after D1. 0.82 D1 Occluded mid-distal LCx. IMPRESSION: 1. PDA not evaluated (appeared to have severe stenosis on CT angiogram. 2.  Occluded mid-distal LCx. 3. The LAD does not appear to have hemodynamically significant stenosis. Dalton Mclean Electronically Signed   By: Loralie Champagne M.D.   On: 12/29/2018 20:24    ELIGIBLE FOR AVAILABLE RESEARCH PROTOCOL: no  ASSESSMENT: 68 y.o.  woman status post right breast upper outer quadrant and right axillary lymph node biopsy 09/03/2017, both positive for invasive ductal carcinoma, grade 3, estrogen receptor positive, progesterone receptor negative, with an MIB-1 of 90%, the lymph node being HER-2 positive, the breast mass HER-2 negative  (a) staging studies September 24, 2017 show a lytic lesion in T8, possible areas of concern at L1-L2  (b) biopsy/kyphoplasty/osteocool of T8 lesion 11/02/2017  confirms metastatic adenocarcinoma, 20% estrogen receptor positive, estrogen receptor negative  (c) PET scan 11/30/2017 shows no visceral disease, minimal metabolic activity at T8, no other bone lesions  (1) neoadjuvant chemotherapy with carboplatin, docetaxel, trastuzumab, and pertuzumab started 10/01/2017, completed 6 cycles 01/26/2018  (a) Docetaxel dropped after three cycles and Gemcitabine/Carbo started on 12/15/2017.   (2) trastuzumab to continue indefinitely  (a) echocardiogram  11/38/2018 shows an ejection fraction in the 55-60% range  (b) echocardiogram 03/01/2018 shows an ejection fraction in the 60-65% range  (c) echocardiogram 06/04/2018 shows an EF of 60-65%  (d) Echocardiogram on 09/06/18 shows EF of 55-60%  (3) right lumpectomy and sentinel lymph node sampling 03/08/2018  Showed a pT1(mic) pN0 invasive ductal carcinoma, grade 2, with negative margins.  (4) adjuvant radiation  04-07-18 to 05-11-18                                              (a) SITE/DOSE:  Right Breast and Axilla, SCV nodes / 50 Gy in 25 fx                (b) radiation to her oligo metastatic disease at T8 (18 Gy) in 1 fraction, 04/21/2018  (5) anastrozole started 05/29/2018  (a) bone density test not yet done  (6) zolendronate started 01/19/2019, to be repeated every 12 weeks as allowed by her dental work   PLAN:  Tracie [name] is now a little over a year out from definitive diagnosis of metastatic breast cancer, with no clinical evidence of disease activity.  This is very favorable.  She is tolerating the trastuzumab well and the plan will be to continue that indefinitely.  She is followed closely by our cardio oncologist group.  She is having more problems from the anastrozole, chiefly because of arthralgias and myalgias but she tells me she can live with this.  We are going to give her her first zoledronate dose today.  I have asked her to take additional calcium so that she does not become hypocalcemic.   We would like to repeat this every 12 weeks but we have been holding out because of dental concerns.  We will continue to follow her closely for this but she tells me she has no dental work planned for roughly 6 months.  I am restaging her in April and seeing her shortly after that.  She knows to call for any other issues that may develop before that visit.    Magrinat, [name], MD  01/19/19 10:28 AM Medical Oncology and Hematology [facility] [address] [place], [place] [address] Tel. [phone]    Fax. [phone]   I, Jacqualyn Posey am acting as a Education administrator for Chauncey Cruel, MD.    I, Lurline Del MD, have reviewed the above documentation for accuracy and completeness, and I agree with the above.  ADDENDUM:  Phyllis's transaminases are mildly elevated today.  She is on multiple medications that could possibly cause this.  We are going to follow her trend and continue to watch, with labs monthly as planned.

## 2019-01-19 ENCOUNTER — Inpatient Hospital Stay: Payer: Managed Care, Other (non HMO)

## 2019-01-19 ENCOUNTER — Inpatient Hospital Stay: Payer: Managed Care, Other (non HMO) | Attending: Oncology

## 2019-01-19 ENCOUNTER — Inpatient Hospital Stay (HOSPITAL_BASED_OUTPATIENT_CLINIC_OR_DEPARTMENT_OTHER): Payer: Managed Care, Other (non HMO) | Admitting: Oncology

## 2019-01-19 ENCOUNTER — Telehealth: Payer: Self-pay | Admitting: Oncology

## 2019-01-19 VITALS — BP 113/56 | HR 76 | Temp 98.1°F | Resp 18 | Ht 63.0 in | Wt 158.0 lb

## 2019-01-19 DIAGNOSIS — C50411 Malignant neoplasm of upper-outer quadrant of right female breast: Secondary | ICD-10-CM

## 2019-01-19 DIAGNOSIS — Z170 Estrogen receptor positive status [ER+]: Secondary | ICD-10-CM

## 2019-01-19 DIAGNOSIS — Z95828 Presence of other vascular implants and grafts: Secondary | ICD-10-CM

## 2019-01-19 DIAGNOSIS — C7951 Secondary malignant neoplasm of bone: Secondary | ICD-10-CM | POA: Diagnosis not present

## 2019-01-19 DIAGNOSIS — R740 Nonspecific elevation of levels of transaminase and lactic acid dehydrogenase [LDH]: Secondary | ICD-10-CM

## 2019-01-19 DIAGNOSIS — M791 Myalgia, unspecified site: Secondary | ICD-10-CM

## 2019-01-19 DIAGNOSIS — C773 Secondary and unspecified malignant neoplasm of axilla and upper limb lymph nodes: Secondary | ICD-10-CM | POA: Insufficient documentation

## 2019-01-19 DIAGNOSIS — Z79811 Long term (current) use of aromatase inhibitors: Secondary | ICD-10-CM

## 2019-01-19 DIAGNOSIS — Z5112 Encounter for antineoplastic immunotherapy: Secondary | ICD-10-CM | POA: Diagnosis present

## 2019-01-19 DIAGNOSIS — F17210 Nicotine dependence, cigarettes, uncomplicated: Secondary | ICD-10-CM

## 2019-01-19 DIAGNOSIS — M2550 Pain in unspecified joint: Secondary | ICD-10-CM

## 2019-01-19 LAB — CMP (CANCER CENTER ONLY)
ALT: 91 U/L — ABNORMAL HIGH (ref 0–44)
AST: 43 U/L — ABNORMAL HIGH (ref 15–41)
Albumin: 3.5 g/dL (ref 3.5–5.0)
Alkaline Phosphatase: 94 U/L (ref 38–126)
Anion gap: 10 (ref 5–15)
BUN: 13 mg/dL (ref 8–23)
CO2: 28 mmol/L (ref 22–32)
Calcium: 10 mg/dL (ref 8.9–10.3)
Chloride: 102 mmol/L (ref 98–111)
Creatinine: 0.86 mg/dL (ref 0.44–1.00)
GFR, Est AFR Am: >60 mL/min (ref >60)
GFR, Estimated: >60 mL/min (ref >60)
Glucose, Bld: 108 mg/dL — ABNORMAL HIGH (ref 70–99)
Potassium: 3.3 mmol/L — ABNORMAL LOW (ref 3.5–5.1)
Sodium: 140 mmol/L (ref 135–145)
TOTAL PROTEIN: 7 g/dL (ref 6.5–8.1)

## 2019-01-19 LAB — CBC WITH DIFFERENTIAL (CANCER CENTER ONLY)
Abs Immature Granulocytes: 0.02 10*3/uL (ref 0.00–0.07)
Basophils Absolute: 0 10*3/uL (ref 0.0–0.1)
Basophils Relative: 1 %
EOS ABS: 0.3 10*3/uL (ref 0.0–0.5)
Eosinophils Relative: 10 %
HCT: 36.9 % (ref 36.0–46.0)
Hemoglobin: 12 g/dL (ref 12.0–15.0)
IMMATURE GRANULOCYTES: 1 %
Lymphocytes Relative: 29 %
Lymphs Abs: 0.8 10*3/uL (ref 0.7–4.0)
MCH: 29.1 pg (ref 26.0–34.0)
MCHC: 32.5 g/dL (ref 30.0–36.0)
MCV: 89.3 fL (ref 80.0–100.0)
Monocytes Absolute: 0.4 10*3/uL (ref 0.1–1.0)
Monocytes Relative: 13 %
NEUTROS PCT: 46 %
NRBC: 0 % (ref 0.0–0.2)
Neutro Abs: 1.4 10*3/uL — ABNORMAL LOW (ref 1.7–7.7)
Platelet Count: 260 10*3/uL (ref 150–400)
RBC: 4.13 MIL/uL (ref 3.87–5.11)
RDW: 17.2 % — ABNORMAL HIGH (ref 11.5–15.5)
WBC Count: 2.9 10*3/uL — ABNORMAL LOW (ref 4.0–10.5)

## 2019-01-19 MED ORDER — SODIUM CHLORIDE 0.9% FLUSH
10.0000 mL | Freq: Once | INTRAVENOUS | Status: AC
  Administered 2019-01-19: 10 mL
  Filled 2019-01-19: qty 10

## 2019-01-19 MED ORDER — ZOLEDRONIC ACID 4 MG/100ML IV SOLN
4.0000 mg | Freq: Once | INTRAVENOUS | Status: AC
  Administered 2019-01-19: 4 mg via INTRAVENOUS
  Filled 2019-01-19: qty 100

## 2019-01-19 MED ORDER — ACETAMINOPHEN 325 MG PO TABS
650.0000 mg | ORAL_TABLET | Freq: Once | ORAL | Status: AC
  Administered 2019-01-19: 650 mg via ORAL

## 2019-01-19 MED ORDER — DIPHENHYDRAMINE HCL 25 MG PO CAPS
25.0000 mg | ORAL_CAPSULE | Freq: Once | ORAL | Status: AC
  Administered 2019-01-19: 25 mg via ORAL

## 2019-01-19 MED ORDER — SODIUM CHLORIDE 0.9% FLUSH
10.0000 mL | INTRAVENOUS | Status: DC | PRN
  Administered 2019-01-19: 10 mL
  Filled 2019-01-19: qty 10

## 2019-01-19 MED ORDER — DIPHENHYDRAMINE HCL 25 MG PO CAPS
ORAL_CAPSULE | ORAL | Status: AC
  Filled 2019-01-19: qty 1

## 2019-01-19 MED ORDER — ACETAMINOPHEN 325 MG PO TABS
ORAL_TABLET | ORAL | Status: AC
  Filled 2019-01-19: qty 2

## 2019-01-19 MED ORDER — HEPARIN SOD (PORK) LOCK FLUSH 100 UNIT/ML IV SOLN
500.0000 [IU] | Freq: Once | INTRAVENOUS | Status: AC | PRN
  Administered 2019-01-19: 500 [IU]
  Filled 2019-01-19: qty 5

## 2019-01-19 MED ORDER — TRASTUZUMAB CHEMO 150 MG IV SOLR
450.0000 mg | Freq: Once | INTRAVENOUS | Status: AC
  Administered 2019-01-19: 450 mg via INTRAVENOUS
  Filled 2019-01-19: qty 21.43

## 2019-01-19 MED ORDER — SODIUM CHLORIDE 0.9 % IV SOLN
Freq: Once | INTRAVENOUS | Status: DC
  Filled 2019-01-19: qty 250

## 2019-01-19 NOTE — Progress Notes (Signed)
Okay to Treat today with elevated ALT 91, per Dr. Jana Hakim.

## 2019-01-19 NOTE — Telephone Encounter (Signed)
Gave calendar  °

## 2019-01-19 NOTE — Patient Instructions (Signed)
[facility] Discharge Instructions for Patients Receiving Chemotherapy  Today you received the following chemotherapy agents:  trastuzumab (Herceptin).  To help prevent nausea and vomiting after your treatment, we encourage you to take your nausea medication as directed.   If you develop nausea and vomiting that is not controlled by your nausea medication, call the clinic.   BELOW ARE SYMPTOMS THAT SHOULD BE REPORTED IMMEDIATELY:  *FEVER GREATER THAN 100.5 F  *CHILLS WITH OR WITHOUT FEVER  NAUSEA AND VOMITING THAT IS NOT CONTROLLED WITH YOUR NAUSEA MEDICATION  *UNUSUAL SHORTNESS OF BREATH  *UNUSUAL BRUISING OR BLEEDING  TENDERNESS IN MOUTH AND THROAT WITH OR WITHOUT PRESENCE OF ULCERS  *URINARY PROBLEMS  *BOWEL PROBLEMS  UNUSUAL RASH Items with * indicate a potential emergency and should be followed up as soon as possible.  Feel free to call the clinic should you have any questions or concerns. The clinic phone number is [phone].  Please show the [facility] at check-in to the Emergency Department and triage nurse.  Trastuzumab injection for infusion What is this medicine? TRASTUZUMAB (tras TOO zoo mab) is a monoclonal antibody. It is used to treat breast cancer and stomach cancer. This medicine may be used for other purposes; ask your health care provider or pharmacist if you have questions. COMMON BRAND NAME(S): Herceptin What should I tell my health care provider before I take this medicine? They need to know if you have any of these conditions: -heart disease -heart failure -lung or breathing disease, like asthma -an unusual or allergic reaction to trastuzumab, benzyl alcohol, or other medications, foods, dyes, or preservatives -pregnant or trying to get pregnant -breast-feeding How should I use this medicine? This drug is given as an infusion into a vein. It is administered in a hospital or clinic by a specially trained health care  professional. Talk to your pediatrician regarding the use of this medicine in children. This medicine is not approved for use in children. Overdosage: If you think you have taken too much of this medicine contact a poison control center or emergency room at once. NOTE: This medicine is only for you. Do not share this medicine with others. What if I miss a dose? It is important not to miss a dose. Call your doctor or health care professional if you are unable to keep an appointment. What may interact with this medicine? This medicine may interact with the following medications: -certain types of chemotherapy, such as daunorubicin, doxorubicin, epirubicin, and idarubicin This list may not describe all possible interactions. Give your health care provider a list of all the medicines, herbs, non-prescription drugs, or dietary supplements you use. Also tell them if you smoke, drink alcohol, or use illegal drugs. Some items may interact with your medicine. What should I watch for while using this medicine? Visit your doctor for checks on your progress. Report any side effects. Continue your course of treatment even though you feel ill unless your doctor tells you to stop. Call your doctor or health care professional for advice if you get a fever, chills or sore throat, or other symptoms of a cold or flu. Do not treat yourself. Try to avoid being around people who are sick. You may experience fever, chills and shaking during your first infusion. These effects are usually mild and can be treated with other medicines. Report any side effects during the infusion to your health care professional. Fever and chills usually do not happen with later infusions. Do not become  pregnant while taking this medicine or for 7 months after stopping it. Women should inform their doctor if they wish to become pregnant or think they might be pregnant. Women of child-bearing potential will need to have a negative pregnancy test  before starting this medicine. There is a potential for serious side effects to an unborn child. Talk to your health care professional or pharmacist for more information. Do not breast-feed an infant while taking this medicine or for 7 months after stopping it. Women must use effective birth control with this medicine. What side effects may I notice from receiving this medicine? Side effects that you should report to your doctor or health care professional as soon as possible: -allergic reactions like skin rash, itching or hives, swelling of the face, lips, or tongue -chest pain or palpitations -cough -dizziness -feeling faint or lightheaded, falls -fever -general ill feeling or flu-like symptoms -signs of worsening heart failure like breathing problems; swelling in your legs and feet -unusually weak or tired Side effects that usually do not require medical attention (report to your doctor or health care professional if they continue or are bothersome): -bone pain -changes in taste -diarrhea -joint pain -nausea/vomiting -weight loss This list may not describe all possible side effects. Call your doctor for medical advice about side effects. You may report side effects to FDA at 1-800-FDA-1088. Where should I keep my medicine? This drug is given in a hospital or clinic and will not be stored at home. NOTE: This sheet is a summary. It may not cover all possible information. If you have questions about this medicine, talk to your doctor, pharmacist, or health care provider.  2018 Elsevier/Gold Standard (2016-12-09 14:37:52)

## 2019-02-02 ENCOUNTER — Ambulatory Visit (HOSPITAL_COMMUNITY)
Admission: RE | Admit: 2019-02-02 | Discharge: 2019-02-02 | Disposition: A | Discharge status: Home / Self Care | Payer: Managed Care, Other (non HMO) | Source: Ambulatory Visit | Attending: Cardiology | Admitting: Cardiology

## 2019-02-02 ENCOUNTER — Ambulatory Visit (HOSPITAL_BASED_OUTPATIENT_CLINIC_OR_DEPARTMENT_OTHER)
Admission: RE | Admit: 2019-02-02 | Discharge: 2019-02-02 | Disposition: A | Discharge status: Home / Self Care | Payer: Managed Care, Other (non HMO) | Source: Ambulatory Visit | Attending: Cardiology | Admitting: Cardiology

## 2019-02-02 VITALS — BP 122/70 | HR 64 | Wt 162.6 lb

## 2019-02-02 DIAGNOSIS — Z800 Family history of malignant neoplasm of digestive organs: Secondary | ICD-10-CM | POA: Diagnosis not present

## 2019-02-02 DIAGNOSIS — J45909 Unspecified asthma, uncomplicated: Secondary | ICD-10-CM | POA: Insufficient documentation

## 2019-02-02 DIAGNOSIS — C50919 Malignant neoplasm of unspecified site of unspecified female breast: Secondary | ICD-10-CM | POA: Insufficient documentation

## 2019-02-02 DIAGNOSIS — I255 Ischemic cardiomyopathy: Secondary | ICD-10-CM

## 2019-02-02 DIAGNOSIS — Z170 Estrogen receptor positive status [ER+]: Secondary | ICD-10-CM | POA: Diagnosis not present

## 2019-02-02 DIAGNOSIS — F17210 Nicotine dependence, cigarettes, uncomplicated: Secondary | ICD-10-CM | POA: Diagnosis present

## 2019-02-02 DIAGNOSIS — Z8249 Family history of ischemic heart disease and other diseases of the circulatory system: Secondary | ICD-10-CM | POA: Diagnosis not present

## 2019-02-02 DIAGNOSIS — Z825 Family history of asthma and other chronic lower respiratory diseases: Secondary | ICD-10-CM | POA: Diagnosis not present

## 2019-02-02 DIAGNOSIS — I2510 Atherosclerotic heart disease of native coronary artery without angina pectoris: Secondary | ICD-10-CM | POA: Insufficient documentation

## 2019-02-02 DIAGNOSIS — E039 Hypothyroidism, unspecified: Secondary | ICD-10-CM | POA: Diagnosis not present

## 2019-02-02 DIAGNOSIS — Z79899 Other long term (current) drug therapy: Secondary | ICD-10-CM | POA: Diagnosis not present

## 2019-02-02 DIAGNOSIS — E785 Hyperlipidemia, unspecified: Secondary | ICD-10-CM | POA: Insufficient documentation

## 2019-02-02 DIAGNOSIS — I110 Hypertensive heart disease with heart failure: Secondary | ICD-10-CM | POA: Insufficient documentation

## 2019-02-02 DIAGNOSIS — I509 Heart failure, unspecified: Secondary | ICD-10-CM | POA: Insufficient documentation

## 2019-02-02 DIAGNOSIS — Z79890 Hormone replacement therapy (postmenopausal): Secondary | ICD-10-CM | POA: Diagnosis not present

## 2019-02-02 DIAGNOSIS — I080 Rheumatic disorders of both mitral and aortic valves: Secondary | ICD-10-CM | POA: Diagnosis not present

## 2019-02-02 DIAGNOSIS — C50411 Malignant neoplasm of upper-outer quadrant of right female breast: Secondary | ICD-10-CM | POA: Diagnosis not present

## 2019-02-02 DIAGNOSIS — I428 Other cardiomyopathies: Secondary | ICD-10-CM

## 2019-02-02 DIAGNOSIS — Z9049 Acquired absence of other specified parts of digestive tract: Secondary | ICD-10-CM | POA: Diagnosis not present

## 2019-02-02 DIAGNOSIS — Z7982 Long term (current) use of aspirin: Secondary | ICD-10-CM | POA: Insufficient documentation

## 2019-02-02 DIAGNOSIS — I429 Cardiomyopathy, unspecified: Secondary | ICD-10-CM | POA: Diagnosis not present

## 2019-02-02 NOTE — Progress Notes (Signed)
Oncology: Dr. Jana Hakim  68 y.o. with history of HTN, asthma, and hyperlidemia was initially referred by Dr. Jana Hakim for cardio-oncology evaluation.  She was diagnosed with breast cancer on the right in 9/18. ER+/PR-.  Lymph node was HER2+, breast biopsy was HER2-.  She completed neoadjuvant chemotherapy with carboplatin, docetaxel, trastuzumab, pertuzumab x 6 cycles and now is on trastuzumab alone with plan for indefinite treatment.      Echo in 12/19 showed EF 50-55%, basal anterolateral and inferolateral hypokinesis, basal inferior severe hypokinesis.  GLS low at -13.6%.  Given fall in EF with regional wall motion abnormalities, coronary CTA was done, showing occluded mid LCx and possible severe stenosis in the proximal PDA.    Echo was done today and reviewed, EF remains in the 50-55% range with basal inferolateral hypokinesis.   She returns for followup of CAD and cardiomyopathy. She continues to smoke 1/2 ppd but has a plan to quit using nicotine patches and gum, to quit after today (her birthday).  She continues to get Herceptin.  She has occasional chest tightness, not clearly exertional.  She has thought in the past that this was due to asthma. No significant exertional dyspnea . No orthopnea/PND.      Labs (3/19): LDL 60 Labs (6/19): K 3.3, creatinine 1.05 Labs (11/19): K 3.9, creatinine 0.86 Labs (12/19): K 3.5, creatinine 0.89 Labs (1/20): K 3.3, creatinine 0.86  PMH: 1. Asthma: mild. 2. HTN 3. Hyperlipidemia 4. Hypothyroidism 5. Diverticulitis with h/o colectomy.  6. Breast cancer: On right, diagnosed 9/18.  ER+/PR-.  Lymph node was HER2+, breast biopsy was HER2-.  She is getting neoadjuvant chemotherapy with carboplatin, docetaxel, trastuzumab, pertuzumab x 6 cycles then trastuzumab indefinitely She will have surgery and radiation.  - Echo (9/18): EF 50-55%, restrictive diastolic function, mild AI, mild-moderate MR.  - Echo (12/18): EF 55-60% with mild focal basal septal  hypertrophy, GLS -20.5%, normal RV size and systolic function, mild AI, moderate MR.  - Echo (3/19): EF 60-65%, mild LVH, moderate diastolic dysfunction, strain inaccurate, mild AI, moderate MR, normla RV size and systolic function.  - Echo (6/19): EF 60-65%, mild LVH, GLS -16.4%, moderate diastolic dysfunction, normal RV size and systolic function, moderate MR, mild AI.  - Echo (9/19): EF 55-60%, moderate diastolic dysfunction, moderate MR, mild AI, GLS -13.7% (but using a different machine).  - Echo (12/19): EF 50-55%, basal anterolateral and inferolateral hypokinesis, basal inferior severe hypokinesis.  Grade II diastolic dysfunction, GLS low at -13.6%.  Normal RV size and systolic function.  - Echo (2/20): EF 50-55%, mild LVH, GLS -12.2%, basal inferolateral hypokinesis, normal RV size and systolic function, moderate mitral regurgitation (?ischemic MR).  7. Active smoker 8. CAD: Coronary CTA (12/19) with possible severe PDA stenosis (unable to assess by FFR), mid LAD non-significant stenosis (FFR 0.81), and occluded mid-distal LCx.   Social History   Socioeconomic History  . Marital status: Divorced    Spouse name: Not on file  . Number of children: Not on file  . Years of education: Not on file  . Highest education level: Not on file  Occupational History  . Not on file  Social Needs  . Financial resource strain: Not on file  . Food insecurity:    Worry: Not on file    Inability: Not on file  . Transportation needs:    Medical: Not on file    Non-medical: Not on file  Tobacco Use  . Smoking status: Current Some Day Smoker    Packs/day:  0.25    Years: 40.00    Pack years: 10.00    Types: Cigarettes  . Smokeless tobacco: Never Used  . Tobacco comment: she is smoking 2 or more daily, she plans to use nicotine patch.   Substance and Sexual Activity  . Alcohol use: Yes    Comment: occasional  . Drug use: No  . Sexual activity: Not on file  Lifestyle  . Physical activity:     Days per week: Not on file    Minutes per session: Not on file  . Stress: Not on file  Relationships  . Social connections:    Talks on phone: Not on file    Gets together: Not on file    Attends religious service: Not on file    Active member of club or organization: Not on file    Attends meetings of clubs or organizations: Not on file    Relationship status: Not on file  . Intimate partner violence:    Fear of current or ex partner: Not on file    Emotionally abused: Not on file    Physically abused: Not on file    Forced sexual activity: Not on file  Other Topics Concern  . Not on file  Social History Narrative  . Not on file   Family History  Problem Relation Age of Onset  . Cancer Mother        rectal  . Hypertension Brother   . Hypertension Brother   . Asthma Son   . Asthma Brother    ROS: All systems reviewed and negative except as per HPI.   Current Outpatient Medications  Medication Sig Dispense Refill  . albuterol (PROAIR HFA) 108 (90 Base) MCG/ACT inhaler INHALE 2 PUFFS BY MOUTH INTO THE LUNGS EVERY 6 HOURS AS NEEDED FOR WHEEZING    . Alirocumab (PRALUENT) 75 MG/ML SOPN Inject 75 mg into the skin every 14 (fourteen) days. 2 pen 11  . amLODipine (NORVASC) 10 MG tablet Take 10 mg by mouth daily.     Marland Kitchen anastrozole (ARIMIDEX) 1 MG tablet Take 1 tablet (1 mg total) by mouth daily. 90 tablet 4  . aspirin EC 81 MG tablet Take 1 tablet (81 mg total) by mouth daily. 90 tablet 3  . cetirizine (ZYRTEC) 10 MG tablet Take 10 mg by mouth daily as needed for allergies.   11  . levothyroxine (SYNTHROID, LEVOTHROID) 50 MCG tablet Take 50 mcg by mouth daily.    Marland Kitchen lidocaine-prilocaine (EMLA) cream Apply 1 application topically as needed (port access). 30 g 0  . losartan-hydrochlorothiazide (HYZAAR) 100-12.5 MG tablet Take 1 tablet by mouth daily.   3  . metoprolol succinate (TOPROL XL) 25 MG 24 hr tablet Take 3 tablets (75 mg total) by mouth daily. 270 tablet 3  .  mometasone-formoterol (DULERA) 100-5 MCG/ACT AERO Inhale 2 puffs into the lungs 2 (two) times daily. 3 Inhaler 0  . nicotine (NICODERM CQ - DOSED IN MG/24 HOURS) 14 mg/24hr patch Place 1 patch (14 mg total) onto the skin daily. 28 patch 0  . nitroGLYCERIN (NITROSTAT) 0.4 MG SL tablet Place 1 tablet (0.4 mg total) under the tongue every 5 (five) minutes as needed for chest pain. 75 tablet 3  . potassium chloride (K-DUR) 10 MEQ tablet TAKE 2 TABLETS(20 MEQ) BY MOUTH DAILY 60 tablet 0   No current facility-administered medications for this encounter.    BP 122/70   Pulse 64   Wt 73.8 kg (162 lb 9.6 oz)  SpO2 96%   BMI 28.80 kg/m   General: NAD Neck: No JVD, no thyromegaly or thyroid nodule.  Lungs: Clear to auscultation bilaterally with normal respiratory effort. CV: Nondisplaced PMI.  Heart regular S1/S2, no S3/S4, no murmur.  No peripheral edema.  No carotid bruit.  Normal pedal pulses.  Abdomen: Soft, nontender, no hepatosplenomegaly, no distention.  Skin: Intact without lesions or rashes.  Neurologic: Alert and oriented x 3.  Psych: Normal affect. Extremities: No clubbing or cyanosis.  HEENT: Normal.   Assessment/Plan: 1. Smoking: I encouraged her again to quit. She has a plan to quit after today using nicotine patches and gum.   - I told her that if this did not work, I would give her a prescription for Chantix or bupropion.  2. HTN: BP controlled on current regimen. 3. Hyperlipidemia: Continue Praluent. Check lipids.  4. Cardiomyopathy: Echo was done today and reviewed, showing EF mildly decreased at 50-55% with lateral wall motion abnormalities.  I do no think that this is due to Herceptin, think this is likely an ischemic cardiomyopathy related to circumflex coronary disease.   - Continue losartan 100 mg daily.  - Continue Toprol XL 75 mg daily.  - I think that she can continue Herceptin, will repeat echo in 3-4 months.  5. Breast cancer: Plan now is for indefinite Herceptin.  As above, suspect decreased EF is due to CAD and not Herceptin.  Ok to continue Herceptin.  6. CAD: Significant CAD on coronary CTA in 12/19.  The mid LCx appears occluded and there appears to be a severe stenosis in the PDA (unable to assess PDA by FFR).  I suspect she had an MI causing the lateral wall motion abnormalities on echo (occlusion of LCx).  She reports atypical chest pain.  She used to think this was asthma but is now concerned that this is angina.   - Continue Praluent.  - Continue ASA 81 daily.  - Stop smoking as above.   - We discussed cardiac cath today.  Given the ongoing atypical chest pain, she is anxious to have a cath done for definitive diagnosis and possible treatment.  We discussed risks/benefits of procedure and she is agreeable to having it done.  I will arrange for cath in the next couple of weeks.  7. Pulmonary: Lung fields on coronary CT showed possible right lung infiltrate.  Repeat chest CT in 3/20 to make sure infiltrate has cleared.   Followup in 3-4 months with echo.   Loralie Champagne 02/02/2019

## 2019-02-02 NOTE — Progress Notes (Signed)
Echocardiogram 2D Echocardiogram has been performed.  Tracie [name] 02/02/2019, 8:43 AM

## 2019-02-02 NOTE — Patient Instructions (Signed)
Please call our office at [phone] when you ae ready to schedule your Left Heart Cath.  Follow up and Echo in 4 months.

## 2019-02-04 ENCOUNTER — Encounter (HOSPITAL_COMMUNITY): Payer: Managed Care, Other (non HMO) | Admitting: Cardiology

## 2019-02-04 ENCOUNTER — Other Ambulatory Visit (HOSPITAL_COMMUNITY): Payer: Managed Care, Other (non HMO)

## 2019-02-07 NOTE — Progress Notes (Signed)
Its no pre cert reqd. I will ask Arbutus Leas to schedule.

## 2019-02-13 ENCOUNTER — Encounter: Payer: Self-pay | Admitting: Oncology

## 2019-02-15 ENCOUNTER — Inpatient Hospital Stay: Payer: Managed Care, Other (non HMO)

## 2019-02-15 ENCOUNTER — Inpatient Hospital Stay: Payer: Managed Care, Other (non HMO) | Attending: Oncology

## 2019-02-15 VITALS — BP 145/68 | HR 73 | Temp 98.6°F | Resp 16

## 2019-02-15 DIAGNOSIS — C50411 Malignant neoplasm of upper-outer quadrant of right female breast: Secondary | ICD-10-CM

## 2019-02-15 DIAGNOSIS — Z170 Estrogen receptor positive status [ER+]: Secondary | ICD-10-CM

## 2019-02-15 DIAGNOSIS — Z5112 Encounter for antineoplastic immunotherapy: Secondary | ICD-10-CM | POA: Insufficient documentation

## 2019-02-15 DIAGNOSIS — C7951 Secondary malignant neoplasm of bone: Secondary | ICD-10-CM | POA: Diagnosis not present

## 2019-02-15 DIAGNOSIS — Z95828 Presence of other vascular implants and grafts: Secondary | ICD-10-CM

## 2019-02-15 DIAGNOSIS — C773 Secondary and unspecified malignant neoplasm of axilla and upper limb lymph nodes: Secondary | ICD-10-CM | POA: Diagnosis not present

## 2019-02-15 LAB — CMP (CANCER CENTER ONLY)
ALT: 12 U/L (ref 0–44)
AST: 13 U/L — ABNORMAL LOW (ref 15–41)
Albumin: 3.4 g/dL — ABNORMAL LOW (ref 3.5–5.0)
Alkaline Phosphatase: 84 U/L (ref 38–126)
Anion gap: 7 (ref 5–15)
BUN: 16 mg/dL (ref 8–23)
CO2: 28 mmol/L (ref 22–32)
Calcium: 9.8 mg/dL (ref 8.9–10.3)
Chloride: 109 mmol/L (ref 98–111)
Creatinine: 1 mg/dL (ref 0.44–1.00)
GFR, Est AFR Am: >60 mL/min (ref >60)
GFR, Estimated: 57 mL/min — ABNORMAL LOW (ref >60)
Glucose, Bld: 110 mg/dL — ABNORMAL HIGH (ref 70–99)
Potassium: 3.8 mmol/L (ref 3.5–5.1)
Sodium: 144 mmol/L (ref 135–145)
TOTAL PROTEIN: 6.5 g/dL (ref 6.5–8.1)

## 2019-02-15 LAB — CBC WITH DIFFERENTIAL (CANCER CENTER ONLY)
Abs Immature Granulocytes: 0.02 10*3/uL (ref 0.00–0.07)
Basophils Absolute: 0 10*3/uL (ref 0.0–0.1)
Basophils Relative: 1 %
Eosinophils Absolute: 0.2 10*3/uL (ref 0.0–0.5)
Eosinophils Relative: 5 %
HCT: 34.9 % — ABNORMAL LOW (ref 36.0–46.0)
Hemoglobin: 11 g/dL — ABNORMAL LOW (ref 12.0–15.0)
Immature Granulocytes: 1 %
Lymphocytes Relative: 23 %
Lymphs Abs: 0.9 10*3/uL (ref 0.7–4.0)
MCH: 28.9 pg (ref 26.0–34.0)
MCHC: 31.5 g/dL (ref 30.0–36.0)
MCV: 91.6 fL (ref 80.0–100.0)
MONO ABS: 0.5 10*3/uL (ref 0.1–1.0)
MONOS PCT: 13 %
Neutro Abs: 2.3 10*3/uL (ref 1.7–7.7)
Neutrophils Relative %: 57 %
Platelet Count: 285 10*3/uL (ref 150–400)
RBC: 3.81 MIL/uL — ABNORMAL LOW (ref 3.87–5.11)
RDW: 18.8 % — ABNORMAL HIGH (ref 11.5–15.5)
WBC Count: 3.9 10*3/uL — ABNORMAL LOW (ref 4.0–10.5)
nRBC: 0 % (ref 0.0–0.2)

## 2019-02-15 MED ORDER — SODIUM CHLORIDE 0.9% FLUSH
10.0000 mL | INTRAVENOUS | Status: DC | PRN
  Administered 2019-02-15: 10 mL
  Filled 2019-02-15: qty 10

## 2019-02-15 MED ORDER — DIPHENHYDRAMINE HCL 25 MG PO CAPS
25.0000 mg | ORAL_CAPSULE | Freq: Once | ORAL | Status: AC
  Administered 2019-02-15: 25 mg via ORAL

## 2019-02-15 MED ORDER — HEPARIN SOD (PORK) LOCK FLUSH 100 UNIT/ML IV SOLN
500.0000 [IU] | Freq: Once | INTRAVENOUS | Status: AC
  Administered 2019-02-15: 500 [IU]
  Filled 2019-02-15: qty 5

## 2019-02-15 MED ORDER — DIPHENHYDRAMINE HCL 25 MG PO CAPS
ORAL_CAPSULE | ORAL | Status: AC
  Filled 2019-02-15: qty 1

## 2019-02-15 MED ORDER — SODIUM CHLORIDE 0.9% FLUSH
10.0000 mL | Freq: Once | INTRAVENOUS | Status: DC
  Filled 2019-02-15: qty 10

## 2019-02-15 MED ORDER — SODIUM CHLORIDE 0.9 % IV SOLN
Freq: Once | INTRAVENOUS | Status: AC
  Administered 2019-02-15: 10:00:00 via INTRAVENOUS
  Filled 2019-02-15: qty 250

## 2019-02-15 MED ORDER — HEPARIN SOD (PORK) LOCK FLUSH 100 UNIT/ML IV SOLN
500.0000 [IU] | Freq: Once | INTRAVENOUS | Status: DC | PRN
  Filled 2019-02-15: qty 5

## 2019-02-15 MED ORDER — ACETAMINOPHEN 325 MG PO TABS
650.0000 mg | ORAL_TABLET | Freq: Once | ORAL | Status: AC
  Administered 2019-02-15: 650 mg via ORAL

## 2019-02-15 MED ORDER — SODIUM CHLORIDE 0.9% FLUSH
10.0000 mL | Freq: Once | INTRAVENOUS | Status: AC
  Administered 2019-02-15: 10 mL
  Filled 2019-02-15: qty 10

## 2019-02-15 MED ORDER — TRASTUZUMAB CHEMO 150 MG IV SOLR
450.0000 mg | Freq: Once | INTRAVENOUS | Status: AC
  Administered 2019-02-15: 450 mg via INTRAVENOUS
  Filled 2019-02-15: qty 21.43

## 2019-02-15 MED ORDER — ACETAMINOPHEN 325 MG PO TABS
ORAL_TABLET | ORAL | Status: AC
  Filled 2019-02-15: qty 2

## 2019-02-15 NOTE — Patient Instructions (Signed)
[facility] Discharge Instructions for Patients Receiving Chemotherapy  Today you received the following chemotherapy agents Herceptin  To help prevent nausea and vomiting after your treatment, we encourage you to take your nausea medication as directed   If you develop nausea and vomiting that is not controlled by your nausea medication, call the clinic.   BELOW ARE SYMPTOMS THAT SHOULD BE REPORTED IMMEDIATELY:  *FEVER GREATER THAN 100.5 F  *CHILLS WITH OR WITHOUT FEVER  NAUSEA AND VOMITING THAT IS NOT CONTROLLED WITH YOUR NAUSEA MEDICATION  *UNUSUAL SHORTNESS OF BREATH  *UNUSUAL BRUISING OR BLEEDING  TENDERNESS IN MOUTH AND THROAT WITH OR WITHOUT PRESENCE OF ULCERS  *URINARY PROBLEMS  *BOWEL PROBLEMS  UNUSUAL RASH Items with * indicate a potential emergency and should be followed up as soon as possible.  Feel free to call the clinic should you have any questions or concerns. The clinic phone number is [phone].  Please show the CHEMO ALERT CARD at check-in to the Emergency Department and triage nurse.   

## 2019-02-16 ENCOUNTER — Ambulatory Visit (HOSPITAL_COMMUNITY): Payer: Managed Care, Other (non HMO)

## 2019-02-16 LAB — CANCER ANTIGEN 27.29: CA 27.29: 29.7 U/mL (ref 0.0–38.6)

## 2019-03-15 ENCOUNTER — Inpatient Hospital Stay: Payer: Managed Care, Other (non HMO) | Attending: Oncology

## 2019-03-15 ENCOUNTER — Other Ambulatory Visit: Payer: Self-pay

## 2019-03-15 ENCOUNTER — Inpatient Hospital Stay: Payer: Managed Care, Other (non HMO)

## 2019-03-15 VITALS — BP 149/82 | HR 77 | Temp 97.9°F | Resp 18

## 2019-03-15 DIAGNOSIS — C50411 Malignant neoplasm of upper-outer quadrant of right female breast: Secondary | ICD-10-CM | POA: Insufficient documentation

## 2019-03-15 DIAGNOSIS — Z170 Estrogen receptor positive status [ER+]: Principal | ICD-10-CM

## 2019-03-15 DIAGNOSIS — C773 Secondary and unspecified malignant neoplasm of axilla and upper limb lymph nodes: Secondary | ICD-10-CM | POA: Diagnosis not present

## 2019-03-15 DIAGNOSIS — Z5112 Encounter for antineoplastic immunotherapy: Secondary | ICD-10-CM | POA: Diagnosis not present

## 2019-03-15 DIAGNOSIS — C7951 Secondary malignant neoplasm of bone: Secondary | ICD-10-CM | POA: Diagnosis not present

## 2019-03-15 DIAGNOSIS — Z95828 Presence of other vascular implants and grafts: Secondary | ICD-10-CM

## 2019-03-15 LAB — CMP (CANCER CENTER ONLY)
ALT: 14 U/L (ref 0–44)
AST: 15 U/L (ref 15–41)
Albumin: 3.5 g/dL (ref 3.5–5.0)
Alkaline Phosphatase: 84 U/L (ref 38–126)
Anion gap: 11 (ref 5–15)
BUN: 10 mg/dL (ref 8–23)
CO2: 24 mmol/L (ref 22–32)
Calcium: 9 mg/dL (ref 8.9–10.3)
Chloride: 107 mmol/L (ref 98–111)
Creatinine: 0.83 mg/dL (ref 0.44–1.00)
GFR, Est AFR Am: >60 mL/min (ref >60)
GFR, Estimated: >60 mL/min (ref >60)
Glucose, Bld: 100 mg/dL — ABNORMAL HIGH (ref 70–99)
Potassium: 3.5 mmol/L (ref 3.5–5.1)
Sodium: 142 mmol/L (ref 135–145)
Total Bilirubin: 0.2 mg/dL — ABNORMAL LOW (ref 0.3–1.2)
Total Protein: 7 g/dL (ref 6.5–8.1)

## 2019-03-15 LAB — CBC WITH DIFFERENTIAL (CANCER CENTER ONLY)
Abs Immature Granulocytes: 0.02 10*3/uL (ref 0.00–0.07)
BASOS ABS: 0 10*3/uL (ref 0.0–0.1)
Basophils Relative: 1 %
Eosinophils Absolute: 0.3 10*3/uL (ref 0.0–0.5)
Eosinophils Relative: 7 %
HCT: 35.8 % — ABNORMAL LOW (ref 36.0–46.0)
HEMOGLOBIN: 11.4 g/dL — AB (ref 12.0–15.0)
Immature Granulocytes: 1 %
LYMPHS PCT: 25 %
Lymphs Abs: 1 10*3/uL (ref 0.7–4.0)
MCH: 29.2 pg (ref 26.0–34.0)
MCHC: 31.8 g/dL (ref 30.0–36.0)
MCV: 91.6 fL (ref 80.0–100.0)
Monocytes Absolute: 0.6 10*3/uL (ref 0.1–1.0)
Monocytes Relative: 15 %
Neutro Abs: 2.1 10*3/uL (ref 1.7–7.7)
Neutrophils Relative %: 51 %
Platelet Count: 344 10*3/uL (ref 150–400)
RBC: 3.91 MIL/uL (ref 3.87–5.11)
RDW: 19.2 % — ABNORMAL HIGH (ref 11.5–15.5)
WBC Count: 4 10*3/uL (ref 4.0–10.5)
nRBC: 0 % (ref 0.0–0.2)

## 2019-03-15 MED ORDER — DIPHENHYDRAMINE HCL 25 MG PO CAPS
25.0000 mg | ORAL_CAPSULE | Freq: Once | ORAL | Status: AC
  Administered 2019-03-15: 25 mg via ORAL

## 2019-03-15 MED ORDER — LIDOCAINE-PRILOCAINE 2.5-2.5 % EX CREA: 1.0000 "application " | TOPICAL_CREAM | CUTANEOUS | 0 refills | Status: DC | PRN

## 2019-03-15 MED ORDER — HEPARIN SOD (PORK) LOCK FLUSH 100 UNIT/ML IV SOLN
500.0000 [IU] | Freq: Once | INTRAVENOUS | Status: AC | PRN
  Administered 2019-03-15: 500 [IU]
  Filled 2019-03-15: qty 5

## 2019-03-15 MED ORDER — SODIUM CHLORIDE 0.9% FLUSH
10.0000 mL | Freq: Once | INTRAVENOUS | Status: AC
  Administered 2019-03-15: 10 mL
  Filled 2019-03-15: qty 10

## 2019-03-15 MED ORDER — TRASTUZUMAB CHEMO 150 MG IV SOLR
450.0000 mg | Freq: Once | INTRAVENOUS | Status: AC
  Administered 2019-03-15: 450 mg via INTRAVENOUS
  Filled 2019-03-15: qty 21.43

## 2019-03-15 MED ORDER — ACETAMINOPHEN 325 MG PO TABS
ORAL_TABLET | ORAL | Status: AC
  Filled 2019-03-15: qty 2

## 2019-03-15 MED ORDER — DIPHENHYDRAMINE HCL 25 MG PO CAPS
ORAL_CAPSULE | ORAL | Status: AC
  Filled 2019-03-15: qty 1

## 2019-03-15 MED ORDER — ACETAMINOPHEN 325 MG PO TABS
650.0000 mg | ORAL_TABLET | Freq: Once | ORAL | Status: AC
  Administered 2019-03-15: 650 mg via ORAL

## 2019-03-15 MED ORDER — SODIUM CHLORIDE 0.9% FLUSH
10.0000 mL | INTRAVENOUS | Status: DC | PRN
  Administered 2019-03-15: 10 mL
  Filled 2019-03-15: qty 10

## 2019-03-15 MED ORDER — SODIUM CHLORIDE 0.9 % IV SOLN
Freq: Once | INTRAVENOUS | Status: AC
  Administered 2019-03-15: 09:00:00 via INTRAVENOUS
  Filled 2019-03-15: qty 250

## 2019-03-15 NOTE — Patient Instructions (Signed)
[facility] Discharge Instructions for Patients Receiving Chemotherapy  Today you received the following chemotherapy agents Herceptin  To help prevent nausea and vomiting after your treatment, we encourage you to take your nausea medication as directed   If you develop nausea and vomiting that is not controlled by your nausea medication, call the clinic.   BELOW ARE SYMPTOMS THAT SHOULD BE REPORTED IMMEDIATELY:  *FEVER GREATER THAN 100.5 F  *CHILLS WITH OR WITHOUT FEVER  NAUSEA AND VOMITING THAT IS NOT CONTROLLED WITH YOUR NAUSEA MEDICATION  *UNUSUAL SHORTNESS OF BREATH  *UNUSUAL BRUISING OR BLEEDING  TENDERNESS IN MOUTH AND THROAT WITH OR WITHOUT PRESENCE OF ULCERS  *URINARY PROBLEMS  *BOWEL PROBLEMS  UNUSUAL RASH Items with * indicate a potential emergency and should be followed up as soon as possible.  Feel free to call the clinic should you have any questions or concerns. The clinic phone number is [phone].  Please show the CHEMO ALERT CARD at check-in to the Emergency Department and triage nurse.   

## 2019-03-22 ENCOUNTER — Other Ambulatory Visit: Payer: Self-pay | Admitting: *Deleted

## 2019-03-23 ENCOUNTER — Telehealth: Payer: Self-pay | Admitting: *Deleted

## 2019-03-23 NOTE — Telephone Encounter (Signed)
Discontinue my CT order and let them go with Dr. [name] CT order.

## 2019-03-23 NOTE — Telephone Encounter (Signed)
Per Darlena in [place]- CT of chest with contrast is under review per insurance pre auth due to similar study of CT w/o contrast is ordered and scheduled by Dr Aundra Dubin.  Current order would need to be withdrawn or changed to above by Dr Claris Gladden office.  If order with drawn then order per this office can receive authorization.  This RN called to Dr Claris Gladden office and left message on VM for Triage with this RN desk number for return call.  This note will also be sent to MD for communication of request.

## 2019-03-24 NOTE — Telephone Encounter (Signed)
Cancelled Dr Claris Gladden order for CT

## 2019-03-25 NOTE — Telephone Encounter (Signed)
FYI Darlena / Velna Hatchet - proceed with getting DR GM's order pre authorized -Val

## 2019-04-05 ENCOUNTER — Ambulatory Visit (HOSPITAL_COMMUNITY): Admission: RE | Admit: 2019-04-05 | Payer: Managed Care, Other (non HMO) | Source: Ambulatory Visit

## 2019-04-07 ENCOUNTER — Encounter (HOSPITAL_COMMUNITY)
Admission: RE | Admit: 2019-04-07 | Discharge: 2019-04-07 | Disposition: A | Discharge status: Home / Self Care | Payer: Managed Care, Other (non HMO) | Source: Ambulatory Visit | Attending: Oncology | Admitting: Oncology

## 2019-04-07 ENCOUNTER — Ambulatory Visit (HOSPITAL_COMMUNITY)
Admission: RE | Admit: 2019-04-07 | Discharge: 2019-04-07 | Disposition: A | Discharge status: Home / Self Care | Payer: Managed Care, Other (non HMO) | Source: Ambulatory Visit | Attending: Oncology | Admitting: Oncology

## 2019-04-07 ENCOUNTER — Other Ambulatory Visit: Payer: Self-pay

## 2019-04-07 DIAGNOSIS — Z170 Estrogen receptor positive status [ER+]: Secondary | ICD-10-CM | POA: Insufficient documentation

## 2019-04-07 DIAGNOSIS — C7951 Secondary malignant neoplasm of bone: Secondary | ICD-10-CM

## 2019-04-07 DIAGNOSIS — C50411 Malignant neoplasm of upper-outer quadrant of right female breast: Secondary | ICD-10-CM | POA: Diagnosis present

## 2019-04-07 MED ORDER — SODIUM CHLORIDE (PF) 0.9 % IJ SOLN
INTRAMUSCULAR | Status: AC
  Filled 2019-04-07: qty 50

## 2019-04-07 MED ORDER — HEPARIN SOD (PORK) LOCK FLUSH 100 UNIT/ML IV SOLN
500.0000 [IU] | Freq: Once | INTRAVENOUS | Status: AC
  Administered 2019-04-07: 11:00:00 500 [IU] via INTRAVENOUS

## 2019-04-07 MED ORDER — TECHNETIUM TC 99M MEDRONATE IV KIT
19.0000 | PACK | Freq: Once | INTRAVENOUS | Status: AC | PRN
  Administered 2019-04-07: 19 via INTRAVENOUS

## 2019-04-07 MED ORDER — IOHEXOL 300 MG/ML  SOLN
75.0000 mL | Freq: Once | INTRAMUSCULAR | Status: AC | PRN
  Administered 2019-04-07: 75 mL via INTRAVENOUS

## 2019-04-07 MED ORDER — HEPARIN SOD (PORK) LOCK FLUSH 100 UNIT/ML IV SOLN
INTRAVENOUS | Status: AC
  Administered 2019-04-07: 11:00:00 500 [IU] via INTRAVENOUS
  Filled 2019-04-07: qty 5

## 2019-04-11 NOTE — Progress Notes (Signed)
Tracie [name]  [phone]) [phone] [phone]) [phone]     ID: CHRISTIEN BERTHELOT DOB: April 26, 1951  MR#: [id number]  NAT#:[id number]  Patient Care Team: Bartholome Bill, MD as PCP - General (Family Medicine) [name], MD as Consulting Physician (General Surgery) Kellis Mcadam, [name], MD as Consulting Physician (Oncology) [name], MD as Attending Physician (Radiation Oncology) Mottinger, [place] DDS (Physical Therapy) [name], MD as Consulting Physician (Cardiology) [name], [name], NP as Nurse Practitioner (Hematology and Oncology) OTHER MD:    CHIEF COMPLAINT: Triple positive breast cancer  CURRENT TREATMENT: trastuzumab; anastrozole, (zolendronate)   HISTORY OF CURRENT ILLNESS: From the original intake note:  Tracie [name] noted a change in her right breast sometime in July 2018. [facility] to report that she had found a "kernel" in her breast. She was advised to see her primary physician which she did. The patient was then set up for bilateral diagnostic mammography with tomography and right breast ultrasonography at [facility] 09/03/2017. The breast density was category I a. In the upper outer quadrant of the right breast there was a 2.1 cm high density mass, which was palpable. Also in the right breast more posteriorly there was a 1.5 cm high density mass which was felt to be a suspicious lymph node. Ultrasound of the right breast confirmed a 2.1 centimeter lobulated solid mass in the right breast upper outer quadrant 15.8 cm from the nipple in the 10:00 radiant. There was a 1.5 cm oval mass in the right axillary tail with a small rounded masses also suspicious for lymph node involvement. A total of 3 suspicious lymph nodes were identified.  On 09/03/2017 the patient underwent biopsy of the breast mass and the suspicious right axillary lymph node. The final pathology ([id number]) found invasive ductal carcinoma, grade 3, in  both. Both tumors were estrogen receptor positive at 70-75%, and both were progesterone receptor negative. Both had an elevated proliferation marker at 90%. The mass in the breast was HER-2 negative, with a signals ratio of 1.25 and the number per cell 1.88. The lymph node mass however was HER-2 positive with a signals ratio of 2.57, and the number per cell 3.60.  The patient's subsequent history is as detailed below.   INTERVAL HISTORY: Tracie [name] returns today for follow-up and treatment of her triple positive breast cancer.  She continues on anastrozole. She tolerates this well and without any noticeable side effects.    She also continues on trastuzumab.  She notes that she feels some bone pain, mostly in her fingers or in her knees. She takes Tums to treat.  Analeese's last echocardiogram on 02/02/2019, showed an ejection fraction in the 50% - 55% range.   Since her last visit here, she underwent a chest CT with contrast on 04/07/2019 showing: Post radiation change anterior right lung. No evidence for metastatic disease in the thorax. Aortic Atherosclerois (ICD10-170.0) Emphysema. ([id number])  She also underwent a whole body bone scan on 04/07/2019 showing: There are foci of increased uptake in the T8, T9, T10, and L2 vertebral bodies, stable. There is symmetric increased uptake in the sacroiliac joint regions bilaterally and in each acetabular region. There is increased uptake in each knee and shoulder region, likely of arthropathic etiology.  There is no evidence of disease progression and some of the previously noted lesions are probably arthritis.  Results for Russum, Verlin B  Ref. Range 06/01/2018 08:04 08/24/2018 08:47 11/23/2018 12:04 02/15/2019 08:20  CA 27.29 Latest Ref Range:  0.0 - 38.6 U/mL 24.8 29.9 25.4 29.7    REVIEW OF SYSTEMS: Tracie [name] continues to work, but she is now working from home via computer due to COVID-19 concerns. Her son is currently not working due to. She  continues to shop for her own groceries, but she is taking adequate precautions. She does not have a formal exercise routine, but she frequently goes up and down stairs at home. She has been having cramps in her legs. She reports a general blood pressure of 970 systolic and 76 diastolic. Her blood sugar has also increased since being at home more due to COVID-19. She notes that her breast feels like leather. The patient denies unusual headaches, visual changes, nausea, vomiting, or dizziness. There has been no unusual cough, phlegm production, or pleurisy. This been no change in bowel or bladder habits. The patient denies unexplained fatigue or unexplained weight loss, bleeding, rash, or fever. A detailed review of systems was otherwise noncontributory.    PAST MEDICAL HISTORY: Past Medical History:  Diagnosis Date   Angina pectoris ([place])    history of    Arthritis    Asthma    Cancer ([place])    right breast   Constipation    Diverticulitis    History of radiation therapy 04/07/18- 05/11/18   50 Gy in 25 fractions to right breast and regional nodes with four fields (no boost)   History of radiation therapy 04/21/2018   T8 spine, 18 Gy in 1 fraction for a total dose of 18 Gy.    HPV in female    Hyperlipidemia    Hypertension    Hypothyroidism    Wears dentures    Wears glasses     PAST SURGICAL HISTORY: Past Surgical History:  Procedure Laterality Date   BREAST LUMPECTOMY WITH RADIOACTIVE SEED AND SENTINEL LYMPH NODE BIOPSY Right 03/08/2018   Procedure: RIGHT BREAST RADIOACTIVE SEED GUIDED LUMPECTOMY WITH RIGHT AXILLARY RADIOACTIVE SEED TARGETED LYMPH NODE EXCISION AND SENTINEL LYMPH NODE BIOPSY, INJECT BLUE DYE RIGHT BREAST;  Surgeon: [name], MD;  Location: [facility];  Service: General;  Laterality: Right;   CESAREAN SECTION     COLON SURGERY  2012   sigmoidectomy   COLOSTOMY     COLOSTOMY TAKEDOWN  10/06/11   IR BONE TUMOR(S)RF ABLATION  11/02/2017   IR KYPHO  THORACIC WITH BONE BIOPSY  11/02/2017   IR RADIOLOGIST EVAL & MGMT  10/07/2017   IR RADIOLOGIST EVAL & MGMT  11/25/2017   PORTACATH PLACEMENT N/A 09/25/2017   Procedure: INSERTION PORT-A-CATH WITH ULTRA SOUND ERAS PATHWAY;  Surgeon: [name], MD;  Location: [facility];  Service: General;  Laterality: N/A;   TONSILLECTOMY     TUBAL LIGATION      FAMILY HISTORY: Family History  Problem Relation Age of Onset   Cancer Mother        rectal   Hypertension Brother    Hypertension Brother    Asthma Son    Asthma Brother    The patient has little information regarding her father. Her mother died at age 73 from a strange cancer--the patient does not know what it was, it may well have been cervical cancer from her description. The patient has one brother, no sisters. There is no history of breast or ovarian cancer in the family to the patient's knowledge    GYNECOLOGIC HISTORY:  No LMP recorded. Patient is postmenopausal.  menarche age 7, first live birth age 45 the patient is Bliss P3. She stopped having  periods at age 48. She never used oral contraceptives or hormone replacement.    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  She is currently working out of her home.  She is divorced. Currently her son [name] lives with her. He is a Art gallery manager and therefore not working during the pandemic. The 2 other children are ANGINETTE ESPEJO, who lives in [place] and works in the theater and media, and Dekota Kirlin lives in [place], and is vice president of a [employer]. The patient has 5 grandchildren. She is a Psychologist, forensic.   ADVANCED DIRECTIVES: Not in place    HEALTH MAINTENANCE: Social History   Tobacco Use   Smoking status: Current Some Day Smoker    Packs/day: 0.25    Years: 40.00    Pack years: 10.00    Types: Cigarettes   Smokeless tobacco: Never Used   Tobacco comment: she is smoking 2 or more daily, she plans to use  nicotine patch.   Substance Use Topics   Alcohol use: Yes    Comment: occasional   Drug use: No    Colonoscopy: September 2012   PAP:  Bone density:09/03/2017    Allergies  Allergen Reactions   Fish Allergy Anaphylaxis and Swelling    Swelling of hands and feet., non shellfish allergy   Other Anaphylaxis    Tree nuts    Ace Inhibitors Other (See Comments) and Cough    Cold like symptoms    Tylenol [Acetaminophen] Other (See Comments)    Headache     Current Outpatient Medications  Medication Sig Dispense Refill   albuterol (PROAIR HFA) 108 (90 Base) MCG/ACT inhaler INHALE 2 PUFFS BY MOUTH INTO THE LUNGS EVERY 6 HOURS AS NEEDED FOR WHEEZING     Alirocumab (PRALUENT) 75 MG/ML SOPN Inject 75 mg into the skin every 14 (fourteen) days. 2 pen 11   amLODipine (NORVASC) 10 MG tablet Take 10 mg by mouth daily.      anastrozole (ARIMIDEX) 1 MG tablet Take 1 tablet (1 mg total) by mouth daily. 90 tablet 4   aspirin EC 81 MG tablet Take 1 tablet (81 mg total) by mouth daily. 90 tablet 3   cetirizine (ZYRTEC) 10 MG tablet Take 10 mg by mouth daily as needed for allergies.   11   levothyroxine (SYNTHROID, LEVOTHROID) 50 MCG tablet Take 50 mcg by mouth daily.     lidocaine-prilocaine (EMLA) cream Apply 1 application topically as needed (port access). 30 g 0   losartan-hydrochlorothiazide (HYZAAR) 100-12.5 MG tablet Take 1 tablet by mouth daily.   3   metoprolol succinate (TOPROL XL) 25 MG 24 hr tablet Take 3 tablets (75 mg total) by mouth daily. 270 tablet 3   mometasone-formoterol (DULERA) 100-5 MCG/ACT AERO Inhale 2 puffs into the lungs 2 (two) times daily. 3 Inhaler 0   nicotine (NICODERM CQ - DOSED IN MG/24 HOURS) 14 mg/24hr patch Place 1 patch (14 mg total) onto the skin daily. 28 patch 0   nitroGLYCERIN (NITROSTAT) 0.4 MG SL tablet Place 1 tablet (0.4 mg total) under the tongue every 5 (five) minutes as needed for chest pain. 75 tablet 3   potassium chloride (K-DUR) 10  MEQ tablet TAKE 2 TABLETS(20 MEQ) BY MOUTH DAILY 60 tablet 0   No current facility-administered medications for this visit.     OBJECTIVE: Middle-aged African-American woman in no acute distress  Vitals:   04/12/19 1038  BP: (!) 146/72  Pulse: 93  Resp: 18  Temp: 98.6 F (37 C)  SpO2: 97%     Body mass index is 30.66 kg/m.   Wt Readings from Last 3 Encounters:  04/12/19 173 lb 1.6 oz (78.5 kg)  02/02/19 162 lb 9.6 oz (73.8 kg)  01/19/19 158 lb (71.7 kg)   ECOG FS:1   Sclerae unicteric, pupils round and equal Oropharynx clear and moist No cervical or supraclavicular adenopathy Lungs no rales or rhonchi Heart regular rate and rhythm Abd soft, nontender, positive bowel sounds MSK no focal spinal tenderness, no upper extremity lymphedema Neuro: nonfocal, well oriented, appropriate affect Breasts: The right breast is status post lumpectomy and radiation.  There are the expected changes due to radiation including thickening of the skin and hyperpigmentation.  There is no evidence of disease recurrence.  Left breast is benign.  Both axillae are benign.  LAB RESULTS:  CMP     Component Value Date/Time   NA 142 04/12/2019 0947   NA 138 12/24/2017 0752   K 3.6 04/12/2019 0947   K 3.3 (L) 12/24/2017 0752   CL 108 04/12/2019 0947   CO2 24 04/12/2019 0947   CO2 25 12/24/2017 0752   GLUCOSE 121 (H) 04/12/2019 0947   GLUCOSE 99 12/24/2017 0752   BUN 15 04/12/2019 0947   BUN 7.9 12/24/2017 0752   CREATININE 0.88 04/12/2019 0947   CREATININE 0.7 12/24/2017 0752   CALCIUM 9.3 04/12/2019 0947   CALCIUM 9.2 12/24/2017 0752   PROT 6.7 04/12/2019 0947   PROT 6.3 (L) 12/24/2017 0752   ALBUMIN 3.3 (L) 04/12/2019 0947   ALBUMIN 3.4 (L) 12/24/2017 0752   AST 17 04/12/2019 0947   AST 15 12/24/2017 0752   ALT 15 04/12/2019 0947   ALT 18 12/24/2017 0752   ALKPHOS 87 04/12/2019 0947   ALKPHOS 93 12/24/2017 0752   BILITOT <0.2 (L) 04/12/2019 0947   BILITOT <0.22 12/24/2017 0752    GFRNONAA >60 04/12/2019 0947   GFRAA >60 04/12/2019 0947    No results found for: Ronnald Ramp, A1GS, A2GS, BETS, BETA2SER, GAMS, MSPIKE, SPEI  No results found for: Nils Pyle, [facility]  Lab Results  Component Value Date   WBC 4.0 04/12/2019   NEUTROABS 2.7 04/12/2019   HGB 10.9 (L) 04/12/2019   HCT 34.3 (L) 04/12/2019   MCV 92.5 04/12/2019   PLT 273 04/12/2019      Chemistry      Component Value Date/Time   NA 142 04/12/2019 0947   NA 138 12/24/2017 0752   K 3.6 04/12/2019 0947   K 3.3 (L) 12/24/2017 0752   CL 108 04/12/2019 0947   CO2 24 04/12/2019 0947   CO2 25 12/24/2017 0752   BUN 15 04/12/2019 0947   BUN 7.9 12/24/2017 0752   CREATININE 0.88 04/12/2019 0947   CREATININE 0.7 12/24/2017 0752      Component Value Date/Time   CALCIUM 9.3 04/12/2019 0947   CALCIUM 9.2 12/24/2017 0752   ALKPHOS 87 04/12/2019 0947   ALKPHOS 93 12/24/2017 0752   AST 17 04/12/2019 0947   AST 15 12/24/2017 0752   ALT 15 04/12/2019 0947   ALT 18 12/24/2017 0752   BILITOT <0.2 (L) 04/12/2019 0947   BILITOT <0.22 12/24/2017 0752       No results found for: LABCA2  No components found for: [id number]    Appointment on 04/12/2019  Component Date Value Ref Range Status   WBC Count 04/12/2019 4.0  4.0 - 10.5 K/uL Final   RBC 04/12/2019 3.71*  3.87 - 5.11 MIL/uL Final   Hemoglobin 04/12/2019 10.9* 12.0 - 15.0 g/dL Final   HCT 04/12/2019 34.3* 36.0 - 46.0 % Final   MCV 04/12/2019 92.5  80.0 - 100.0 fL Final   MCH 04/12/2019 29.4  26.0 - 34.0 pg Final   MCHC 04/12/2019 31.8  30.0 - 36.0 g/dL Final   RDW 04/12/2019 18.6* 11.5 - 15.5 % Final   Platelet Count 04/12/2019 273  150 - 400 K/uL Final   nRBC 04/12/2019 0.0  0.0 - 0.2 % Final   Neutrophils Relative % 04/12/2019 65  % Final   Neutro Abs 04/12/2019 2.7  1.7 - 7.7 K/uL Final   Lymphocytes Relative 04/12/2019 21  % Final   Lymphs Abs 04/12/2019 0.8  0.7 - 4.0 K/uL Final   Monocytes  Relative 04/12/2019 10  % Final   Monocytes Absolute 04/12/2019 0.4  0.1 - 1.0 K/uL Final   Eosinophils Relative 04/12/2019 3  % Final   Eosinophils Absolute 04/12/2019 0.1  0.0 - 0.5 K/uL Final   Basophils Relative 04/12/2019 0  % Final   Basophils Absolute 04/12/2019 0.0  0.0 - 0.1 K/uL Final   Immature Granulocytes 04/12/2019 1  % Final   Abs Immature Granulocytes 04/12/2019 0.02  0.00 - 0.07 K/uL Final   Performed at [facility], [place] [address] [place], [place] [address]   Sodium 04/12/2019 142  135 - 145 mmol/L Final   Potassium 04/12/2019 3.6  3.5 - 5.1 mmol/L Final   Chloride 04/12/2019 108  98 - 111 mmol/L Final   CO2 04/12/2019 24  22 - 32 mmol/L Final   Glucose, Bld 04/12/2019 121* 70 - 99 mg/dL Final   BUN 04/12/2019 15  8 - 23 mg/dL Final   Creatinine 04/12/2019 0.88  0.44 - 1.00 mg/dL Final   Calcium 04/12/2019 9.3  8.9 - 10.3 mg/dL Final   Total Protein 04/12/2019 6.7  6.5 - 8.1 g/dL Final   Albumin 04/12/2019 3.3* 3.5 - 5.0 g/dL Final   AST 04/12/2019 17  15 - 41 U/L Final   ALT 04/12/2019 15  0 - 44 U/L Final   Alkaline Phosphatase 04/12/2019 87  38 - 126 U/L Final   Total Bilirubin 04/12/2019 <0.2* 0.3 - 1.2 mg/dL Final   GFR, Est Non Af Am 04/12/2019 >60  >60 mL/min Final   GFR, Est AFR Am 04/12/2019 >60  >60 mL/min Final   Anion gap 04/12/2019 10  5 - 15 Final   Performed at [facility], [address] [place], [place] [address]    (this displays the last labs from the last 3 days)  No results found for: TOTALPROTELP, ALBUMINELP, A1GS, A2GS, BETS, BETA2SER, GAMS, MSPIKE, SPEI (this displays SPEP labs)  No results found for: KPAFRELGTCHN, LAMBDASER, KAPLAMBRATIO (kappa/lambda light chains)  No results found for: HGBA, HGBA2QUANT, HGBFQUANT, HGBSQUAN (Hemoglobinopathy evaluation)   No results found for: LDH  No results found for: IRON, TIBC, IRONPCTSAT (Iron and TIBC)  No results  found for: FERRITIN  Urinalysis    Component Value Date/Time   LABSPEC 1.020 02/10/2011 2058   PHURINE 6.0 02/10/2011 2058   HGBUR LARGE (A) 02/10/2011 2058   BILIRUBINUR MODERATE (A) 02/10/2011 2058   KETONESUR 15 (A) 02/10/2011 2058   PROTEINUR >=300 (A) 02/10/2011 2058   UROBILINOGEN >=8.0 02/10/2011 2058   NITRITE NEGATIVE 02/10/2011 2058   LEUKOCYTESUR  02/10/2011 2058    NEGATIVE Biochemical Testing Only. Please order routine urinalysis from main  lab if confirmatory testing is needed.     STUDIES: Ct Chest W Contrast  Result Date: 04/07/2019 CLINICAL DATA:  Triple positive breast cancer. EXAM: CT CHEST WITH CONTRAST TECHNIQUE: Multidetector CT imaging of the chest was performed during intravenous contrast administration. CONTRAST:  52m OMNIPAQUE IOHEXOL 300 MG/ML  SOLN COMPARISON:  07/30/2018 FINDINGS: Cardiovascular: Heart size upper normal. Coronary artery calcification is evident. Atherosclerotic calcification is noted in the wall of the thoracic aorta. Right Port-A-Cath tip is positioned in the distal SVC. Mediastinum/Nodes: No mediastinal lymphadenopathy. There is no hilar lymphadenopathy. The esophagus has normal imaging features. There is no axillary lymphadenopathy. Lungs/Pleura: The central tracheobronchial airways are patent. Centrilobular emphsyema noted. Subpleural reticulation anterior right upper lobe is compatible with radiation fibrosis no suspicious pulmonary nodule or mass. No pleural effusion. Upper Abdomen: Stable thickening of both adrenal glands. Musculoskeletal: No worrisome lytic or sclerotic osseous abnormality. Evidence of prior thoracic vertebral augmentation. Skin thickening noted right breast. IMPRESSION: 1. Post radiation change anterior right lung. No evidence for metastatic disease in the thorax. 2.  Aortic Atherosclerois (ICD10-170.0) 3.  Emphysema. (([id number]) Electronically Signed   By: EMisty StanleyM.D.   On: 04/07/2019 16:27   Nm Bone Scan Whole  Body  Result Date: 04/07/2019 CLINICAL DATA:  Breast carcinoma EXAM: NUCLEAR MEDICINE WHOLE BODY BONE SCAN TECHNIQUE: Whole body anterior and posterior images were obtained approximately 3 hours after intravenous injection of radiopharmaceutical. RADIOPHARMACEUTICALS:  19.0 mCi Technetium-928mDP IV COMPARISON:  Chest CT April 07, 2019; prior bone scans July 30, 2018 and September 24, 2017; chest CT October 13, 2017 FINDINGS: There are foci of increased uptake in the T8, T9, T10, and L2 vertebral bodies, stable. There is symmetric increased uptake in the sacroiliac joint regions bilaterally and in each acetabular region. There is increased uptake in each knee and shoulder region, likely of arthropathic etiology. Kidneys are noted in the flank positions bilaterally. Increased uptake is noted in the mid face, likely of paranasal sinus inflammatory etiology. No new foci of abnormal radiotracer uptake noted. IMPRESSION: Areas of increased radiotracer uptake in the thoracic and upper lumbar spine remains stable. There is been previous kyphoplasty procedure at T8. A previous lytic lesion was demonstrated in this area on CT examination from 2018. Other areas of abnormal uptake may well have arthropathic etiology. There has been no demonstrable progression of metastatic disease compared to prior study. Electronically Signed   By: WiLowella GripII M.D.   On: 04/07/2019 16:30    ELIGIBLE FOR AVAILABLE RESEARCH PROTOCOL: no   ASSESSMENT: [age].o. Guy woman status post right breast upper outer quadrant and right axillary lymph node biopsy 09/03/2017, both positive for invasive ductal carcinoma, grade 3, estrogen receptor positive, progesterone receptor negative, with an MIB-1 of 90%, the lymph node being HER-2 positive, the breast mass HER-2 negative  (a) staging studies September 24, 2017 show a lytic lesion in T8, possible areas of concern at L1-L2  (b) biopsy/kyphoplasty/osteocool of T8 lesion 11/02/2017  confirms metastatic adenocarcinoma, 20% estrogen receptor positive, estrogen receptor negative  (c) PET scan 11/30/2017 shows no visceral disease, minimal metabolic activity at T8, no other bone lesions  (1) neoadjuvant chemotherapy with carboplatin, docetaxel, trastuzumab, and pertuzumab started 10/01/2017, completed 6 cycles 01/26/2018  (a) Docetaxel dropped after three cycles and Gemcitabine/Carbo started on 12/15/2017.   (2) trastuzumab to continue indefinitely  (a) echocardiogram 11/38/2018 shows an ejection fraction in the 55-60% range  (b) echocardiogram 03/01/2018 shows an ejection fraction in the 60-65% range  (  c) echocardiogram 06/04/2018 shows an EF of 60-65%  (d) Echocardiogram on 09/06/18 shows EF of 55-60%  (3) right lumpectomy and sentinel lymph node sampling 03/08/2018  Showed a pT1(mic) pN0 invasive ductal carcinoma, grade 2, with negative margins.  (4) adjuvant radiation  04-07-18 to 05-11-18                                              (a) SITE/DOSE:  Right Breast and Axilla, SCV nodes / 50 Gy in 25 fx                (b) radiation to her oligo metastatic disease at T8 (18 Gy) in 1 fraction, 04/21/2018  (5) anastrozole started 05/29/2018  (a) bone density test pending  (6) zolendronate started 01/19/2019, to be repeated every 12 weeks   PLAN:  Tracie [name] is now a year and a half out from definitive diagnosis of metastatic breast cancer, with very well-controlled disease.  She has no symptoms related to her tumor and minimal symptoms related to her treatment.  This is very favorable.  Accordingly we are continuing the anastrozole and trastuzumab indefinitely, until there is evidence of disease progression.  She is also receiving zoledronate every 12 weeks, with good tolerance.  She will have an echocardiogram likely sometime in May.  She is going to see me in 9 weeks.  She will be restaged in 18 weeks  We reviewed pandemic precautions and she is being very  appropriate  She knows to call for any other issue that may develop before the next visit.    Diasia Henken, [name], MD  04/12/19 10:54 AM Medical Oncology and Hematology [facility] [address] [place], [place] [address] Tel. [phone]    Fax. [phone]   I, Jacqualyn Posey am acting as a Education administrator for Chauncey Cruel, MD.   I, Lurline Del MD, have reviewed the above documentation for accuracy and completeness, and I agree with the above.

## 2019-04-12 ENCOUNTER — Inpatient Hospital Stay: Payer: Managed Care, Other (non HMO)

## 2019-04-12 ENCOUNTER — Inpatient Hospital Stay (HOSPITAL_BASED_OUTPATIENT_CLINIC_OR_DEPARTMENT_OTHER): Payer: Managed Care, Other (non HMO) | Admitting: Oncology

## 2019-04-12 ENCOUNTER — Other Ambulatory Visit: Payer: Self-pay

## 2019-04-12 ENCOUNTER — Inpatient Hospital Stay: Payer: Managed Care, Other (non HMO) | Attending: Oncology

## 2019-04-12 VITALS — BP 146/72 | HR 93 | Temp 98.6°F | Resp 18 | Ht 63.0 in | Wt 173.1 lb

## 2019-04-12 DIAGNOSIS — Z923 Personal history of irradiation: Secondary | ICD-10-CM

## 2019-04-12 DIAGNOSIS — Z170 Estrogen receptor positive status [ER+]: Secondary | ICD-10-CM | POA: Diagnosis not present

## 2019-04-12 DIAGNOSIS — Z79811 Long term (current) use of aromatase inhibitors: Secondary | ICD-10-CM | POA: Diagnosis not present

## 2019-04-12 DIAGNOSIS — C7951 Secondary malignant neoplasm of bone: Secondary | ICD-10-CM | POA: Insufficient documentation

## 2019-04-12 DIAGNOSIS — E039 Hypothyroidism, unspecified: Secondary | ICD-10-CM

## 2019-04-12 DIAGNOSIS — Z95828 Presence of other vascular implants and grafts: Secondary | ICD-10-CM

## 2019-04-12 DIAGNOSIS — F17210 Nicotine dependence, cigarettes, uncomplicated: Secondary | ICD-10-CM

## 2019-04-12 DIAGNOSIS — I10 Essential (primary) hypertension: Secondary | ICD-10-CM | POA: Insufficient documentation

## 2019-04-12 DIAGNOSIS — C50411 Malignant neoplasm of upper-outer quadrant of right female breast: Secondary | ICD-10-CM

## 2019-04-12 DIAGNOSIS — E785 Hyperlipidemia, unspecified: Secondary | ICD-10-CM | POA: Diagnosis not present

## 2019-04-12 DIAGNOSIS — Z79899 Other long term (current) drug therapy: Secondary | ICD-10-CM | POA: Diagnosis not present

## 2019-04-12 DIAGNOSIS — Z5112 Encounter for antineoplastic immunotherapy: Secondary | ICD-10-CM | POA: Insufficient documentation

## 2019-04-12 LAB — CBC WITH DIFFERENTIAL (CANCER CENTER ONLY)
Abs Immature Granulocytes: 0.02 10*3/uL (ref 0.00–0.07)
Basophils Absolute: 0 10*3/uL (ref 0.0–0.1)
Basophils Relative: 0 %
Eosinophils Absolute: 0.1 10*3/uL (ref 0.0–0.5)
Eosinophils Relative: 3 %
HCT: 34.3 % — ABNORMAL LOW (ref 36.0–46.0)
Hemoglobin: 10.9 g/dL — ABNORMAL LOW (ref 12.0–15.0)
Immature Granulocytes: 1 %
Lymphocytes Relative: 21 %
Lymphs Abs: 0.8 10*3/uL (ref 0.7–4.0)
MCH: 29.4 pg (ref 26.0–34.0)
MCHC: 31.8 g/dL (ref 30.0–36.0)
MCV: 92.5 fL (ref 80.0–100.0)
Monocytes Absolute: 0.4 10*3/uL (ref 0.1–1.0)
Monocytes Relative: 10 %
Neutro Abs: 2.7 10*3/uL (ref 1.7–7.7)
Neutrophils Relative %: 65 %
Platelet Count: 273 10*3/uL (ref 150–400)
RBC: 3.71 MIL/uL — ABNORMAL LOW (ref 3.87–5.11)
RDW: 18.6 % — ABNORMAL HIGH (ref 11.5–15.5)
WBC Count: 4 10*3/uL (ref 4.0–10.5)
nRBC: 0 % (ref 0.0–0.2)

## 2019-04-12 LAB — CMP (CANCER CENTER ONLY)
ALT: 15 U/L (ref 0–44)
AST: 17 U/L (ref 15–41)
Albumin: 3.3 g/dL — ABNORMAL LOW (ref 3.5–5.0)
Alkaline Phosphatase: 87 U/L (ref 38–126)
Anion gap: 10 (ref 5–15)
BUN: 15 mg/dL (ref 8–23)
CO2: 24 mmol/L (ref 22–32)
Calcium: 9.3 mg/dL (ref 8.9–10.3)
Chloride: 108 mmol/L (ref 98–111)
Creatinine: 0.88 mg/dL (ref 0.44–1.00)
GFR, Est AFR Am: >60 mL/min (ref >60)
GFR, Estimated: >60 mL/min (ref >60)
Glucose, Bld: 121 mg/dL — ABNORMAL HIGH (ref 70–99)
Potassium: 3.6 mmol/L (ref 3.5–5.1)
Sodium: 142 mmol/L (ref 135–145)
Total Bilirubin: <0.2 mg/dL — ABNORMAL LOW (ref 0.3–1.2)
Total Protein: 6.7 g/dL (ref 6.5–8.1)

## 2019-04-12 MED ORDER — TRASTUZUMAB CHEMO 150 MG IV SOLR
450.0000 mg | Freq: Once | INTRAVENOUS | Status: AC
  Administered 2019-04-12: 450 mg via INTRAVENOUS
  Filled 2019-04-12: qty 21.43

## 2019-04-12 MED ORDER — ACETAMINOPHEN 325 MG PO TABS
ORAL_TABLET | ORAL | Status: AC
  Filled 2019-04-12: qty 2

## 2019-04-12 MED ORDER — HEPARIN SOD (PORK) LOCK FLUSH 100 UNIT/ML IV SOLN
500.0000 [IU] | Freq: Once | INTRAVENOUS | Status: AC | PRN
  Administered 2019-04-12: 500 [IU]
  Filled 2019-04-12: qty 5

## 2019-04-12 MED ORDER — SODIUM CHLORIDE 0.9% FLUSH
10.0000 mL | INTRAVENOUS | Status: DC | PRN
  Administered 2019-04-12: 10 mL
  Filled 2019-04-12: qty 10

## 2019-04-12 MED ORDER — DIPHENHYDRAMINE HCL 25 MG PO CAPS
ORAL_CAPSULE | ORAL | Status: AC
  Filled 2019-04-12: qty 1

## 2019-04-12 MED ORDER — SODIUM CHLORIDE 0.9 % IV SOLN
Freq: Once | INTRAVENOUS | Status: AC
  Administered 2019-04-12: 11:00:00 via INTRAVENOUS
  Filled 2019-04-12: qty 250

## 2019-04-12 MED ORDER — ZOLEDRONIC ACID 4 MG/100ML IV SOLN
4.0000 mg | Freq: Once | INTRAVENOUS | Status: AC
  Administered 2019-04-12: 4 mg via INTRAVENOUS
  Filled 2019-04-12: qty 100

## 2019-04-12 MED ORDER — DIPHENHYDRAMINE HCL 25 MG PO CAPS
25.0000 mg | ORAL_CAPSULE | Freq: Once | ORAL | Status: AC
  Administered 2019-04-12: 25 mg via ORAL

## 2019-04-12 MED ORDER — ACETAMINOPHEN 325 MG PO TABS
650.0000 mg | ORAL_TABLET | Freq: Once | ORAL | Status: AC
  Administered 2019-04-12: 650 mg via ORAL

## 2019-04-12 MED ORDER — SODIUM CHLORIDE 0.9% FLUSH
10.0000 mL | Freq: Once | INTRAVENOUS | Status: AC
  Administered 2019-04-12: 10 mL
  Filled 2019-04-12: qty 10

## 2019-04-12 NOTE — Patient Instructions (Signed)
[facility] Discharge Instructions for Patients Receiving Chemotherapy  Today you received the following chemotherapy agents: Herceptin, zometa  To help prevent nausea and vomiting after your treatment, we encourage you to take your nausea medication as directed.   If you develop nausea and vomiting that is not controlled by your nausea medication, call the clinic.   BELOW ARE SYMPTOMS THAT SHOULD BE REPORTED IMMEDIATELY:  *FEVER GREATER THAN 100.5 F  *CHILLS WITH OR WITHOUT FEVER  NAUSEA AND VOMITING THAT IS NOT CONTROLLED WITH YOUR NAUSEA MEDICATION  *UNUSUAL SHORTNESS OF BREATH  *UNUSUAL BRUISING OR BLEEDING  TENDERNESS IN MOUTH AND THROAT WITH OR WITHOUT PRESENCE OF ULCERS  *URINARY PROBLEMS  *BOWEL PROBLEMS  UNUSUAL RASH Items with * indicate a potential emergency and should be followed up as soon as possible.  Feel free to call the clinic should you have any questions or concerns. The clinic phone number is [phone].  Please show the CHEMO ALERT CARD at check-in to the Emergency Department and triage nurse.   

## 2019-04-13 ENCOUNTER — Other Ambulatory Visit: Payer: Managed Care, Other (non HMO)

## 2019-04-13 ENCOUNTER — Ambulatory Visit: Payer: Managed Care, Other (non HMO)

## 2019-04-13 ENCOUNTER — Telehealth: Payer: Self-pay | Admitting: Oncology

## 2019-04-13 ENCOUNTER — Ambulatory Visit: Payer: Managed Care, Other (non HMO) | Admitting: Oncology

## 2019-04-13 NOTE — Telephone Encounter (Signed)
Tried to reach °

## 2019-04-20 ENCOUNTER — Ambulatory Visit (HOSPITAL_COMMUNITY): Payer: Medicare Other

## 2019-04-22 ENCOUNTER — Telehealth: Payer: Self-pay | Admitting: Oncology

## 2019-04-22 NOTE — Telephone Encounter (Signed)
R/s appt per 4/23 sch message - unable to reach patient . Unable to leave message - sent reminder letter in the mail.

## 2019-04-26 ENCOUNTER — Encounter: Payer: Self-pay | Admitting: Oncology

## 2019-05-04 ENCOUNTER — Ambulatory Visit: Payer: Managed Care, Other (non HMO)

## 2019-05-04 ENCOUNTER — Other Ambulatory Visit: Payer: Managed Care, Other (non HMO)

## 2019-05-11 ENCOUNTER — Inpatient Hospital Stay: Payer: Managed Care, Other (non HMO)

## 2019-05-11 ENCOUNTER — Other Ambulatory Visit: Payer: Self-pay

## 2019-05-11 ENCOUNTER — Inpatient Hospital Stay: Payer: Managed Care, Other (non HMO) | Attending: Oncology

## 2019-05-11 VITALS — BP 154/76 | HR 74 | Temp 98.3°F | Resp 18

## 2019-05-11 DIAGNOSIS — C50411 Malignant neoplasm of upper-outer quadrant of right female breast: Secondary | ICD-10-CM | POA: Insufficient documentation

## 2019-05-11 DIAGNOSIS — Z923 Personal history of irradiation: Secondary | ICD-10-CM | POA: Insufficient documentation

## 2019-05-11 DIAGNOSIS — Z5112 Encounter for antineoplastic immunotherapy: Secondary | ICD-10-CM | POA: Diagnosis present

## 2019-05-11 DIAGNOSIS — C773 Secondary and unspecified malignant neoplasm of axilla and upper limb lymph nodes: Secondary | ICD-10-CM | POA: Diagnosis not present

## 2019-05-11 DIAGNOSIS — Z170 Estrogen receptor positive status [ER+]: Secondary | ICD-10-CM

## 2019-05-11 DIAGNOSIS — Z95828 Presence of other vascular implants and grafts: Secondary | ICD-10-CM

## 2019-05-11 DIAGNOSIS — C7951 Secondary malignant neoplasm of bone: Secondary | ICD-10-CM

## 2019-05-11 DIAGNOSIS — M898X0 Other specified disorders of bone, multiple sites: Secondary | ICD-10-CM | POA: Diagnosis not present

## 2019-05-11 LAB — CMP (CANCER CENTER ONLY)
ALT: 16 U/L (ref 0–44)
AST: 14 U/L — ABNORMAL LOW (ref 15–41)
Albumin: 3.4 g/dL — ABNORMAL LOW (ref 3.5–5.0)
Alkaline Phosphatase: 90 U/L (ref 38–126)
Anion gap: 8 (ref 5–15)
BUN: 12 mg/dL (ref 8–23)
CO2: 27 mmol/L (ref 22–32)
Calcium: 9.3 mg/dL (ref 8.9–10.3)
Chloride: 106 mmol/L (ref 98–111)
Creatinine: 0.82 mg/dL (ref 0.44–1.00)
GFR, Est AFR Am: >60 mL/min (ref >60)
GFR, Estimated: >60 mL/min (ref >60)
Glucose, Bld: 94 mg/dL (ref 70–99)
Potassium: 3.7 mmol/L (ref 3.5–5.1)
Sodium: 141 mmol/L (ref 135–145)
Total Bilirubin: <0.2 mg/dL — ABNORMAL LOW (ref 0.3–1.2)
Total Protein: 7 g/dL (ref 6.5–8.1)

## 2019-05-11 LAB — CBC WITH DIFFERENTIAL (CANCER CENTER ONLY)
Abs Immature Granulocytes: 0.02 10*3/uL (ref 0.00–0.07)
Basophils Absolute: 0 10*3/uL (ref 0.0–0.1)
Basophils Relative: 1 %
Eosinophils Absolute: 0.1 10*3/uL (ref 0.0–0.5)
Eosinophils Relative: 3 %
HCT: 36.4 % (ref 36.0–46.0)
Hemoglobin: 11.7 g/dL — ABNORMAL LOW (ref 12.0–15.0)
Immature Granulocytes: 1 %
Lymphocytes Relative: 25 %
Lymphs Abs: 1.1 10*3/uL (ref 0.7–4.0)
MCH: 29.4 pg (ref 26.0–34.0)
MCHC: 32.1 g/dL (ref 30.0–36.0)
MCV: 91.5 fL (ref 80.0–100.0)
Monocytes Absolute: 0.5 10*3/uL (ref 0.1–1.0)
Monocytes Relative: 12 %
Neutro Abs: 2.6 10*3/uL (ref 1.7–7.7)
Neutrophils Relative %: 58 %
Platelet Count: 313 10*3/uL (ref 150–400)
RBC: 3.98 MIL/uL (ref 3.87–5.11)
RDW: 17.5 % — ABNORMAL HIGH (ref 11.5–15.5)
WBC Count: 4.3 10*3/uL (ref 4.0–10.5)
nRBC: 0 % (ref 0.0–0.2)

## 2019-05-11 MED ORDER — ACETAMINOPHEN 325 MG PO TABS
ORAL_TABLET | ORAL | Status: AC
  Filled 2019-05-11: qty 2

## 2019-05-11 MED ORDER — SODIUM CHLORIDE 0.9% FLUSH
10.0000 mL | INTRAVENOUS | Status: DC | PRN
  Administered 2019-05-11: 10 mL
  Filled 2019-05-11: qty 10

## 2019-05-11 MED ORDER — DIPHENHYDRAMINE HCL 25 MG PO CAPS
25.0000 mg | ORAL_CAPSULE | Freq: Once | ORAL | Status: AC
  Administered 2019-05-11: 25 mg via ORAL

## 2019-05-11 MED ORDER — HEPARIN SOD (PORK) LOCK FLUSH 100 UNIT/ML IV SOLN
500.0000 [IU] | Freq: Once | INTRAVENOUS | Status: AC | PRN
  Administered 2019-05-11: 500 [IU]
  Filled 2019-05-11: qty 5

## 2019-05-11 MED ORDER — SODIUM CHLORIDE 0.9% FLUSH
10.0000 mL | Freq: Once | INTRAVENOUS | Status: AC
  Administered 2019-05-11: 10 mL
  Filled 2019-05-11: qty 10

## 2019-05-11 MED ORDER — ACETAMINOPHEN 325 MG PO TABS
650.0000 mg | ORAL_TABLET | Freq: Once | ORAL | Status: AC
  Administered 2019-05-11: 650 mg via ORAL

## 2019-05-11 MED ORDER — DIPHENHYDRAMINE HCL 25 MG PO CAPS
ORAL_CAPSULE | ORAL | Status: AC
  Filled 2019-05-11: qty 1

## 2019-05-11 MED ORDER — SODIUM CHLORIDE 0.9 % IV SOLN
Freq: Once | INTRAVENOUS | Status: AC
  Administered 2019-05-11: 14:00:00 via INTRAVENOUS
  Filled 2019-05-11: qty 250

## 2019-05-11 MED ORDER — TRASTUZUMAB CHEMO 150 MG IV SOLR
450.0000 mg | Freq: Once | INTRAVENOUS | Status: AC
  Administered 2019-05-11: 450 mg via INTRAVENOUS
  Filled 2019-05-11: qty 21.43

## 2019-05-11 NOTE — Patient Instructions (Signed)
[facility] Discharge Instructions for Patients Receiving Chemotherapy  Today you received the following chemotherapy agents Herceptin  To help prevent nausea and vomiting after your treatment, we encourage you to take your nausea medication as directed   If you develop nausea and vomiting that is not controlled by your nausea medication, call the clinic.   BELOW ARE SYMPTOMS THAT SHOULD BE REPORTED IMMEDIATELY:  *FEVER GREATER THAN 100.5 F  *CHILLS WITH OR WITHOUT FEVER  NAUSEA AND VOMITING THAT IS NOT CONTROLLED WITH YOUR NAUSEA MEDICATION  *UNUSUAL SHORTNESS OF BREATH  *UNUSUAL BRUISING OR BLEEDING  TENDERNESS IN MOUTH AND THROAT WITH OR WITHOUT PRESENCE OF ULCERS  *URINARY PROBLEMS  *BOWEL PROBLEMS  UNUSUAL RASH Items with * indicate a potential emergency and should be followed up as soon as possible.  Feel free to call the clinic should you have any questions or concerns. The clinic phone number is [phone].  Please show the CHEMO ALERT CARD at check-in to the Emergency Department and triage nurse.   

## 2019-05-11 NOTE — Patient Instructions (Signed)

## 2019-05-12 LAB — CANCER ANTIGEN 27.29: CA 27.29: 39.2 U/mL — ABNORMAL HIGH (ref 0.0–38.6)

## 2019-05-18 ENCOUNTER — Encounter: Payer: Self-pay | Admitting: Oncology

## 2019-05-18 ENCOUNTER — Encounter: Payer: Self-pay | Admitting: *Deleted

## 2019-05-24 ENCOUNTER — Other Ambulatory Visit: Payer: Managed Care, Other (non HMO)

## 2019-05-24 ENCOUNTER — Ambulatory Visit: Payer: Managed Care, Other (non HMO)

## 2019-05-31 ENCOUNTER — Ambulatory Visit: Payer: Managed Care, Other (non HMO)

## 2019-05-31 ENCOUNTER — Other Ambulatory Visit: Payer: Managed Care, Other (non HMO)

## 2019-06-08 ENCOUNTER — Inpatient Hospital Stay: Payer: Managed Care, Other (non HMO)

## 2019-06-08 ENCOUNTER — Inpatient Hospital Stay: Payer: Managed Care, Other (non HMO) | Attending: Oncology

## 2019-06-08 ENCOUNTER — Other Ambulatory Visit: Payer: Self-pay

## 2019-06-08 VITALS — BP 145/77 | HR 69 | Temp 98.5°F | Resp 18

## 2019-06-08 DIAGNOSIS — C50411 Malignant neoplasm of upper-outer quadrant of right female breast: Secondary | ICD-10-CM

## 2019-06-08 DIAGNOSIS — Z170 Estrogen receptor positive status [ER+]: Secondary | ICD-10-CM

## 2019-06-08 DIAGNOSIS — Z95828 Presence of other vascular implants and grafts: Secondary | ICD-10-CM

## 2019-06-08 DIAGNOSIS — C7951 Secondary malignant neoplasm of bone: Secondary | ICD-10-CM | POA: Insufficient documentation

## 2019-06-08 DIAGNOSIS — Z5112 Encounter for antineoplastic immunotherapy: Secondary | ICD-10-CM | POA: Diagnosis not present

## 2019-06-08 LAB — CBC WITH DIFFERENTIAL (CANCER CENTER ONLY)
Abs Immature Granulocytes: 0.02 K/uL (ref 0.00–0.07)
Basophils Absolute: 0 K/uL (ref 0.0–0.1)
Basophils Relative: 0 %
Eosinophils Absolute: 0.2 K/uL (ref 0.0–0.5)
Eosinophils Relative: 5 %
HCT: 38 % (ref 36.0–46.0)
Hemoglobin: 12.1 g/dL (ref 12.0–15.0)
Immature Granulocytes: 0 %
Lymphocytes Relative: 25 %
Lymphs Abs: 1.1 K/uL (ref 0.7–4.0)
MCH: 29 pg (ref 26.0–34.0)
MCHC: 31.8 g/dL (ref 30.0–36.0)
MCV: 91.1 fL (ref 80.0–100.0)
Monocytes Absolute: 0.6 K/uL (ref 0.1–1.0)
Monocytes Relative: 13 %
Neutro Abs: 2.5 K/uL (ref 1.7–7.7)
Neutrophils Relative %: 57 %
Platelet Count: 320 K/uL (ref 150–400)
RBC: 4.17 MIL/uL (ref 3.87–5.11)
RDW: 17.1 % — ABNORMAL HIGH (ref 11.5–15.5)
WBC Count: 4.5 K/uL (ref 4.0–10.5)
nRBC: 0 % (ref 0.0–0.2)

## 2019-06-08 LAB — CMP (CANCER CENTER ONLY)
ALT: 16 U/L (ref 0–44)
AST: 16 U/L (ref 15–41)
Albumin: 3.6 g/dL (ref 3.5–5.0)
Alkaline Phosphatase: 85 U/L (ref 38–126)
Anion gap: 11 (ref 5–15)
BUN: 12 mg/dL (ref 8–23)
CO2: 25 mmol/L (ref 22–32)
Calcium: 10.2 mg/dL (ref 8.9–10.3)
Chloride: 102 mmol/L (ref 98–111)
Creatinine: 0.83 mg/dL (ref 0.44–1.00)
GFR, Est AFR Am: >60 mL/min (ref >60)
GFR, Estimated: >60 mL/min (ref >60)
Glucose, Bld: 97 mg/dL (ref 70–99)
Potassium: 3.8 mmol/L (ref 3.5–5.1)
Sodium: 138 mmol/L (ref 135–145)
Total Bilirubin: <0.2 mg/dL — ABNORMAL LOW (ref 0.3–1.2)
Total Protein: 7.4 g/dL (ref 6.5–8.1)

## 2019-06-08 MED ORDER — SODIUM CHLORIDE 0.9 % IV SOLN
Freq: Once | INTRAVENOUS | Status: AC
  Administered 2019-06-08: 15:00:00 via INTRAVENOUS
  Filled 2019-06-08: qty 250

## 2019-06-08 MED ORDER — TRASTUZUMAB CHEMO 150 MG IV SOLR
450.0000 mg | Freq: Once | INTRAVENOUS | Status: AC
  Administered 2019-06-08: 450 mg via INTRAVENOUS
  Filled 2019-06-08: qty 21.43

## 2019-06-08 MED ORDER — DIPHENHYDRAMINE HCL 25 MG PO CAPS
25.0000 mg | ORAL_CAPSULE | Freq: Once | ORAL | Status: AC
  Administered 2019-06-08: 25 mg via ORAL

## 2019-06-08 MED ORDER — SODIUM CHLORIDE 0.9% FLUSH
10.0000 mL | Freq: Once | INTRAVENOUS | Status: AC
  Administered 2019-06-08: 10 mL
  Filled 2019-06-08: qty 10

## 2019-06-08 MED ORDER — ACETAMINOPHEN 325 MG PO TABS
ORAL_TABLET | ORAL | Status: AC
  Filled 2019-06-08: qty 2

## 2019-06-08 MED ORDER — SODIUM CHLORIDE 0.9% FLUSH
10.0000 mL | INTRAVENOUS | Status: DC | PRN
  Administered 2019-06-08: 10 mL
  Filled 2019-06-08: qty 10

## 2019-06-08 MED ORDER — HEPARIN SOD (PORK) LOCK FLUSH 100 UNIT/ML IV SOLN
500.0000 [IU] | Freq: Once | INTRAVENOUS | Status: AC | PRN
  Administered 2019-06-08: 500 [IU]
  Filled 2019-06-08: qty 5

## 2019-06-08 MED ORDER — ACETAMINOPHEN 325 MG PO TABS
650.0000 mg | ORAL_TABLET | Freq: Once | ORAL | Status: AC
  Administered 2019-06-08: 650 mg via ORAL

## 2019-06-08 MED ORDER — DIPHENHYDRAMINE HCL 25 MG PO CAPS
ORAL_CAPSULE | ORAL | Status: AC
  Filled 2019-06-08: qty 1

## 2019-06-08 NOTE — Progress Notes (Signed)
Spoke w/ Dr. Jana Hakim to let him know pt's ECHO wasn't scheduled that was supposed to take place in May. He is going to make sure that she gets one before her next treatment. Okay to treat today.   Demetrius Charity, PharmD, [facility] Pharmacist Pharmacy Phone: [phone] 06/08/2019

## 2019-06-08 NOTE — Progress Notes (Signed)
Okay to treat with Echo from 01/2019 per Dr. Jana Hakim - Infusion nurse obtained appointment for 6/19.

## 2019-06-08 NOTE — Patient Instructions (Signed)
[facility] Discharge Instructions for Patients Receiving Chemotherapy  Today you received the following chemotherapy agents Herceptin  To help prevent nausea and vomiting after your treatment, we encourage you to take your nausea medication as directed   If you develop nausea and vomiting that is not controlled by your nausea medication, call the clinic.   BELOW ARE SYMPTOMS THAT SHOULD BE REPORTED IMMEDIATELY:  *FEVER GREATER THAN 100.5 F  *CHILLS WITH OR WITHOUT FEVER  NAUSEA AND VOMITING THAT IS NOT CONTROLLED WITH YOUR NAUSEA MEDICATION  *UNUSUAL SHORTNESS OF BREATH  *UNUSUAL BRUISING OR BLEEDING  TENDERNESS IN MOUTH AND THROAT WITH OR WITHOUT PRESENCE OF ULCERS  *URINARY PROBLEMS  *BOWEL PROBLEMS  UNUSUAL RASH Items with * indicate a potential emergency and should be followed up as soon as possible.  Feel free to call the clinic should you have any questions or concerns. The clinic phone number is [phone].  Please show the CHEMO ALERT CARD at check-in to the Emergency Department and triage nurse.   

## 2019-06-14 ENCOUNTER — Other Ambulatory Visit: Payer: Managed Care, Other (non HMO)

## 2019-06-14 ENCOUNTER — Ambulatory Visit: Payer: Managed Care, Other (non HMO)

## 2019-06-14 ENCOUNTER — Ambulatory Visit: Payer: Managed Care, Other (non HMO) | Admitting: Oncology

## 2019-06-17 ENCOUNTER — Other Ambulatory Visit: Payer: Self-pay

## 2019-06-17 ENCOUNTER — Ambulatory Visit (HOSPITAL_COMMUNITY)
Admission: RE | Admit: 2019-06-17 | Discharge: 2019-06-17 | Disposition: A | Discharge status: Home / Self Care | Payer: Managed Care, Other (non HMO) | Source: Ambulatory Visit | Attending: Cardiology | Admitting: Cardiology

## 2019-06-17 DIAGNOSIS — I5021 Acute systolic (congestive) heart failure: Secondary | ICD-10-CM | POA: Insufficient documentation

## 2019-06-17 DIAGNOSIS — Z170 Estrogen receptor positive status [ER+]: Secondary | ICD-10-CM | POA: Diagnosis not present

## 2019-06-17 DIAGNOSIS — C50411 Malignant neoplasm of upper-outer quadrant of right female breast: Secondary | ICD-10-CM | POA: Diagnosis not present

## 2019-06-17 NOTE — Progress Notes (Signed)
  Echocardiogram 2D Echocardiogram has been performed.  Johny Chess 06/17/2019, 11:16 AM

## 2019-06-20 ENCOUNTER — Other Ambulatory Visit (HOSPITAL_COMMUNITY): Payer: Self-pay

## 2019-06-20 DIAGNOSIS — C50411 Malignant neoplasm of upper-outer quadrant of right female breast: Secondary | ICD-10-CM

## 2019-06-21 ENCOUNTER — Ambulatory Visit: Payer: Managed Care, Other (non HMO) | Admitting: Oncology

## 2019-06-21 ENCOUNTER — Other Ambulatory Visit: Payer: Managed Care, Other (non HMO)

## 2019-06-21 ENCOUNTER — Ambulatory Visit: Payer: Managed Care, Other (non HMO)

## 2019-06-29 ENCOUNTER — Telehealth (HOSPITAL_COMMUNITY): Payer: Self-pay | Admitting: Cardiology

## 2019-06-29 NOTE — Telephone Encounter (Signed)
Called and left message for pt to call back.  Need to schedule pt for Echo in 4 months per staff message from Jovita Kussmaul, RN.

## 2019-06-29 NOTE — Telephone Encounter (Signed)
Correction..the patient needs f/u with Dr. Aundra Dubin and Echo in 4 mos.  Will advise pt when she calls back and have added recall to pt.

## 2019-07-06 ENCOUNTER — Inpatient Hospital Stay: Payer: Managed Care, Other (non HMO)

## 2019-07-06 ENCOUNTER — Inpatient Hospital Stay: Payer: Managed Care, Other (non HMO) | Attending: Oncology

## 2019-07-06 ENCOUNTER — Inpatient Hospital Stay (HOSPITAL_BASED_OUTPATIENT_CLINIC_OR_DEPARTMENT_OTHER): Payer: Managed Care, Other (non HMO) | Admitting: Oncology

## 2019-07-06 ENCOUNTER — Ambulatory Visit (HOSPITAL_COMMUNITY)
Admission: RE | Admit: 2019-07-06 | Discharge: 2019-07-06 | Disposition: A | Discharge status: Home / Self Care | Payer: Managed Care, Other (non HMO) | Source: Ambulatory Visit | Attending: Oncology | Admitting: Oncology

## 2019-07-06 ENCOUNTER — Other Ambulatory Visit: Payer: Self-pay

## 2019-07-06 VITALS — BP 156/80 | HR 85 | Temp 98.7°F | Resp 18 | Ht 63.0 in | Wt 176.4 lb

## 2019-07-06 DIAGNOSIS — C50411 Malignant neoplasm of upper-outer quadrant of right female breast: Secondary | ICD-10-CM

## 2019-07-06 DIAGNOSIS — M25551 Pain in right hip: Secondary | ICD-10-CM

## 2019-07-06 DIAGNOSIS — Z170 Estrogen receptor positive status [ER+]: Secondary | ICD-10-CM | POA: Insufficient documentation

## 2019-07-06 DIAGNOSIS — Z95828 Presence of other vascular implants and grafts: Secondary | ICD-10-CM

## 2019-07-06 DIAGNOSIS — Z79811 Long term (current) use of aromatase inhibitors: Secondary | ICD-10-CM

## 2019-07-06 DIAGNOSIS — C7951 Secondary malignant neoplasm of bone: Secondary | ICD-10-CM

## 2019-07-06 DIAGNOSIS — Z79899 Other long term (current) drug therapy: Secondary | ICD-10-CM

## 2019-07-06 LAB — CBC WITH DIFFERENTIAL (CANCER CENTER ONLY)
Abs Immature Granulocytes: 0.02 10*3/uL (ref 0.00–0.07)
Basophils Absolute: 0 10*3/uL (ref 0.0–0.1)
Basophils Relative: 1 %
Eosinophils Absolute: 0.2 10*3/uL (ref 0.0–0.5)
Eosinophils Relative: 4 %
HCT: 36.4 % (ref 36.0–46.0)
Hemoglobin: 11.9 g/dL — ABNORMAL LOW (ref 12.0–15.0)
Immature Granulocytes: 0 %
Lymphocytes Relative: 23 %
Lymphs Abs: 1.1 10*3/uL (ref 0.7–4.0)
MCH: 29 pg (ref 26.0–34.0)
MCHC: 32.7 g/dL (ref 30.0–36.0)
MCV: 88.8 fL (ref 80.0–100.0)
Monocytes Absolute: 0.5 10*3/uL (ref 0.1–1.0)
Monocytes Relative: 12 %
Neutro Abs: 2.8 10*3/uL (ref 1.7–7.7)
Neutrophils Relative %: 60 %
Platelet Count: 297 10*3/uL (ref 150–400)
RBC: 4.1 MIL/uL (ref 3.87–5.11)
RDW: 17.6 % — ABNORMAL HIGH (ref 11.5–15.5)
WBC Count: 4.7 10*3/uL (ref 4.0–10.5)
nRBC: 0 % (ref 0.0–0.2)

## 2019-07-06 LAB — CMP (CANCER CENTER ONLY)
ALT: 19 U/L (ref 0–44)
AST: 19 U/L (ref 15–41)
Albumin: 3.7 g/dL (ref 3.5–5.0)
Alkaline Phosphatase: 95 U/L (ref 38–126)
Anion gap: 9 (ref 5–15)
BUN: 11 mg/dL (ref 8–23)
CO2: 26 mmol/L (ref 22–32)
Calcium: 9.2 mg/dL (ref 8.9–10.3)
Chloride: 105 mmol/L (ref 98–111)
Creatinine: 0.84 mg/dL (ref 0.44–1.00)
GFR, Est AFR Am: >60 mL/min (ref >60)
GFR, Estimated: >60 mL/min (ref >60)
Glucose, Bld: 110 mg/dL — ABNORMAL HIGH (ref 70–99)
Potassium: 3.4 mmol/L — ABNORMAL LOW (ref 3.5–5.1)
Sodium: 140 mmol/L (ref 135–145)
Total Bilirubin: <0.2 mg/dL — ABNORMAL LOW (ref 0.3–1.2)
Total Protein: 7.3 g/dL (ref 6.5–8.1)

## 2019-07-06 MED ORDER — DIPHENHYDRAMINE HCL 25 MG PO CAPS
ORAL_CAPSULE | ORAL | Status: AC
  Filled 2019-07-06: qty 1

## 2019-07-06 MED ORDER — SODIUM CHLORIDE 0.9% FLUSH
10.0000 mL | INTRAVENOUS | Status: DC | PRN
  Administered 2019-07-06: 10 mL
  Filled 2019-07-06: qty 10

## 2019-07-06 MED ORDER — DIPHENHYDRAMINE HCL 25 MG PO CAPS
25.0000 mg | ORAL_CAPSULE | Freq: Once | ORAL | Status: AC
  Administered 2019-07-06: 25 mg via ORAL

## 2019-07-06 MED ORDER — SODIUM CHLORIDE 0.9 % IV SOLN
Freq: Once | INTRAVENOUS | Status: AC
  Administered 2019-07-06: 14:00:00 via INTRAVENOUS
  Filled 2019-07-06: qty 250

## 2019-07-06 MED ORDER — ZOLEDRONIC ACID 4 MG/100ML IV SOLN
4.0000 mg | Freq: Once | INTRAVENOUS | Status: AC
  Administered 2019-07-06: 16:00:00 4 mg via INTRAVENOUS
  Filled 2019-07-06: qty 100

## 2019-07-06 MED ORDER — ZOLEDRONIC ACID 4 MG/5ML IV CONC
4.0000 mg | Freq: Once | INTRAVENOUS | Status: DC
  Filled 2019-07-06: qty 5

## 2019-07-06 MED ORDER — HEPARIN SOD (PORK) LOCK FLUSH 100 UNIT/ML IV SOLN
500.0000 [IU] | Freq: Once | INTRAVENOUS | Status: AC | PRN
  Administered 2019-07-06: 500 [IU]
  Filled 2019-07-06: qty 5

## 2019-07-06 MED ORDER — ACETAMINOPHEN 325 MG PO TABS
ORAL_TABLET | ORAL | Status: AC
  Filled 2019-07-06: qty 2

## 2019-07-06 MED ORDER — TRASTUZUMAB CHEMO 150 MG IV SOLR
450.0000 mg | Freq: Once | INTRAVENOUS | Status: AC
  Administered 2019-07-06: 450 mg via INTRAVENOUS
  Filled 2019-07-06: qty 21.43

## 2019-07-06 MED ORDER — ACETAMINOPHEN 325 MG PO TABS
650.0000 mg | ORAL_TABLET | Freq: Once | ORAL | Status: AC
  Administered 2019-07-06: 650 mg via ORAL

## 2019-07-06 MED ORDER — SODIUM CHLORIDE 0.9% FLUSH
10.0000 mL | Freq: Once | INTRAVENOUS | Status: AC
  Administered 2019-07-06: 10 mL
  Filled 2019-07-06: qty 10

## 2019-07-06 NOTE — Patient Instructions (Addendum)
[facility] Discharge Instructions for Patients Receiving Chemotherapy  Today you received the following chemotherapy agents: Trastuzumab (Herceptin)  To help prevent nausea and vomiting after your treatment, we encourage you to take your nausea medication as directed.   If you develop nausea and vomiting that is not controlled by your nausea medication, call the clinic.   BELOW ARE SYMPTOMS THAT SHOULD BE REPORTED IMMEDIATELY:  *FEVER GREATER THAN 100.5 F  *CHILLS WITH OR WITHOUT FEVER  NAUSEA AND VOMITING THAT IS NOT CONTROLLED WITH YOUR NAUSEA MEDICATION  *UNUSUAL SHORTNESS OF BREATH  *UNUSUAL BRUISING OR BLEEDING  TENDERNESS IN MOUTH AND THROAT WITH OR WITHOUT PRESENCE OF ULCERS  *URINARY PROBLEMS  *BOWEL PROBLEMS  UNUSUAL RASH Items with * indicate a potential emergency and should be followed up as soon as possible.  Feel free to call the clinic should you have any questions or concerns. The clinic phone number is [phone].  Please show the [place] at check-in to the Emergency Department and triage nurse.  Zoledronic Acid injection (Hypercalcemia, Oncology) What is this medicine? ZOLEDRONIC ACID (ZOE le dron ik AS id) lowers the amount of calcium loss from bone. It is used to treat too much calcium in your blood from cancer. It is also used to prevent complications of cancer that has spread to the bone. This medicine may be used for other purposes; ask your health care provider or pharmacist if you have questions. COMMON BRAND NAME(S): Zometa What should I tell my health care provider before I take this medicine? They need to know if you have any of these conditions:  aspirin-sensitive asthma  cancer, especially if you are receiving medicines used to treat cancer  dental disease or wear dentures  infection  kidney disease  receiving corticosteroids like dexamethasone or prednisone  an unusual or allergic reaction to zoledronic  acid, other medicines, foods, dyes, or preservatives  pregnant or trying to get pregnant  breast-feeding How should I use this medicine? This medicine is for infusion into a vein. It is given by a health care professional in a hospital or clinic setting. Talk to your pediatrician regarding the use of this medicine in children. Special care may be needed. Overdosage: If you think you have taken too much of this medicine contact a poison control center or emergency room at once. NOTE: This medicine is only for you. Do not share this medicine with others. What if I miss a dose? It is important not to miss your dose. Call your doctor or health care professional if you are unable to keep an appointment. What may interact with this medicine?  certain antibiotics given by injection  NSAIDs, medicines for pain and inflammation, like ibuprofen or naproxen  some diuretics like bumetanide, furosemide  teriparatide  thalidomide This list may not describe all possible interactions. Give your health care provider a list of all the medicines, herbs, non-prescription drugs, or dietary supplements you use. Also tell them if you smoke, drink alcohol, or use illegal drugs. Some items may interact with your medicine. What should I watch for while using this medicine? Visit your doctor or health care professional for regular checkups. It may be some time before you see the benefit from this medicine. Do not stop taking your medicine unless your doctor tells you to. Your doctor may order blood tests or other tests to see how you are doing. Women should inform their doctor if they wish to become pregnant or think they might be  pregnant. There is a potential for serious side effects to an unborn child. Talk to your health care professional or pharmacist for more information. You should make sure that you get enough calcium and vitamin D while you are taking this medicine. Discuss the foods you eat and the  vitamins you take with your health care professional. Some people who take this medicine have severe bone, joint, and/or muscle pain. This medicine may also increase your risk for jaw problems or a broken thigh bone. Tell your doctor right away if you have severe pain in your jaw, bones, joints, or muscles. Tell your doctor if you have any pain that does not go away or that gets worse. Tell your dentist and dental surgeon that you are taking this medicine. You should not have major dental surgery while on this medicine. See your dentist to have a dental exam and fix any dental problems before starting this medicine. Take good care of your teeth while on this medicine. Make sure you see your dentist for regular follow-up appointments. What side effects may I notice from receiving this medicine? Side effects that you should report to your doctor or health care professional as soon as possible:  allergic reactions like skin rash, itching or hives, swelling of the face, lips, or tongue  anxiety, confusion, or depression  breathing problems  changes in vision  eye pain  feeling faint or lightheaded, falls  jaw pain, especially after dental work  mouth sores  muscle cramps, stiffness, or weakness  redness, blistering, peeling or loosening of the skin, including inside the mouth  trouble passing urine or change in the amount of urine Side effects that usually do not require medical attention (report to your doctor or health care professional if they continue or are bothersome):  bone, joint, or muscle pain  constipation  diarrhea  fever  hair loss  irritation at site where injected  loss of appetite  nausea, vomiting  stomach upset  trouble sleeping  trouble swallowing  weak or tired This list may not describe all possible side effects. Call your doctor for medical advice about side effects. You may report side effects to FDA at 1-800-FDA-1088. Where should I keep my  medicine? This drug is given in a hospital or clinic and will not be stored at home. NOTE: This sheet is a summary. It may not cover all possible information. If you have questions about this medicine, talk to your doctor, pharmacist, or health care provider.  2020 Elsevier/Gold Standard (2014-05-13 14:19:39)  Coronavirus (COVID-19) Are you at risk?  Are you at risk for the Coronavirus (COVID-19)?  To be considered HIGH RISK for Coronavirus (COVID-19), you have to meet the following criteria:  . Traveled to [place], [place], [place], [place] or [place]; or in the [place] to [place], [place], [place], or [place]; and have fever, cough, and shortness of breath within the last 2 weeks of travel OR . Been in close contact with a person diagnosed with COVID-19 within the last 2 weeks and have fever, cough, and shortness of breath . IF YOU DO NOT MEET THESE CRITERIA, YOU ARE CONSIDERED LOW RISK FOR COVID-19.  What to do if you are HIGH RISK for COVID-19?  Marland Kitchen If you are having a medical emergency, call 911. . Seek medical care right away. Before you go to a doctor's office, urgent care or emergency department, call ahead and tell them about your recent travel, contact with someone diagnosed with COVID-19,  and your symptoms. You should receive instructions from your physician's office regarding next steps of care.  . When you arrive at healthcare provider, tell the healthcare staff immediately you have returned from visiting [place], [place], [place], [place] or [place]; or traveled in the [place] to [place], [place], [place], or [place]; in the last two weeks or you have been in close contact with a person diagnosed with COVID-19 in the last 2 weeks.   . Tell the health care staff about your symptoms: fever, cough and shortness of breath. . After you have been seen by a medical provider, you will be either: o Tested for (COVID-19) and discharged home on quarantine except to  seek medical care if symptoms worsen, and asked to  - Stay home and avoid contact with others until you get your results (4-5 days)  - Avoid travel on public transportation if possible (such as bus, train, or airplane) or o Sent to the Emergency Department by EMS for evaluation, COVID-19 testing, and possible admission depending on your condition and test results.  What to do if you are LOW RISK for COVID-19?  Reduce your risk of any infection by using the same precautions used for avoiding the common cold or flu:  Marland Kitchen Wash your hands often with soap and warm water for at least 20 seconds.  If soap and water are not readily available, use an alcohol-based hand sanitizer with at least 60% alcohol.  . If coughing or sneezing, cover your mouth and nose by coughing or sneezing into the elbow areas of your shirt or coat, into a tissue or into your sleeve (not your hands). . Avoid shaking hands with others and consider head nods or verbal greetings only. . Avoid touching your eyes, nose, or mouth with unwashed hands.  . Avoid close contact with people who are sick. . Avoid places or events with large numbers of people in one location, like concerts or sporting events. . Carefully consider travel plans you have or are making. . If you are planning any travel outside or inside the [place], visit the CDC's Travelers' Health webpage for the latest health notices. . If you have some symptoms but not all symptoms, continue to monitor at home and seek medical attention if your symptoms worsen. . If you are having a medical emergency, call 911.   [place] / e-Visit: [email or web address]         [facility]: [facility]: [phone]                   [facility]: [place] Discharge Instructions for Patients Receiving  Chemotherapy  Today you received the following chemotherapy agents:  Herceptin, zometa  To help prevent nausea and vomiting after your treatment, we encourage you to take your nausea medication as directed.   If you develop nausea and vomiting that is not controlled by your nausea medication, call the clinic.   BELOW ARE SYMPTOMS THAT SHOULD BE REPORTED IMMEDIATELY:  *FEVER GREATER THAN 100.5 F  *CHILLS WITH OR WITHOUT FEVER  NAUSEA AND VOMITING THAT IS NOT CONTROLLED WITH YOUR NAUSEA MEDICATION  *UNUSUAL SHORTNESS OF BREATH  *UNUSUAL BRUISING OR BLEEDING  TENDERNESS IN MOUTH AND THROAT WITH OR WITHOUT PRESENCE OF ULCERS  *URINARY PROBLEMS  *BOWEL PROBLEMS  UNUSUAL RASH Items with * indicate a potential emergency and should be followed up as  soon as possible.  Feel free to call the clinic should you have any questions or concerns. The clinic phone number is [phone].  Please show the [place] at check-in to the Emergency Department and triage nurse.

## 2019-07-06 NOTE — Progress Notes (Signed)
[place]  [phone]) [phone] Fax:(336) [phone]     ID: Tracie [name] DOB: 26-Jul-1951  MR#: [id number]  JSH#:[id number]  Patient Care Team: Bartholome Bill, MD as PCP - General (Family Medicine) [name], MD as Consulting Physician (General Surgery) Magrinat, [name], MD as Consulting Physician (Oncology) [name], MD as Attending Physician (Radiation Oncology) Mottinger, Jackson DDS (Physical Therapy) [name], MD as Consulting Physician (Cardiology) [name], [name], NP as Nurse Practitioner (Hematology and Oncology) OTHER MD:    CHIEF COMPLAINT: Triple positive breast cancer  CURRENT TREATMENT: trastuzumab; anastrozole, zoledronate   INTERVAL HISTORY: Tracie [name] returns today for follow-up and treatment of her triple positive breast cancer.  She continues on anastrozole. She tolerates this well and without any noticeable side effects. She denies hot flashes, noting she is cold even in the hot weather.  She also continues on trastuzumab.  Since her last visit, she underwent repeat echocardiogram on 06/17/2019. This showed an ejection fraction of 50-55%, which is stable from 01/2019.  We continue to follow her tumor marker: Lab Results  Component Value Date   CA2729 39.2 (H) 05/11/2019   CA2729 29.7 02/15/2019   CA2729 25.4 11/23/2018   CA2729 29.9 08/24/2018   CA2729 24.8 06/01/2018    REVIEW OF SYSTEMS: Alianis reports pain in her right hip that goes down her right leg. She states the pain is spontaneous-- it may happen one day then not again for a week. She does not exercise aside from walking around the grocery store, her house, and her yard. Her son used to live with her. He is a Art gallery manager, but due to the virus, he is picking up other odd jobs in the meantime. A detailed review of systems was otherwise noncontributory.     HISTORY OF CURRENT ILLNESS: From the original intake note:  Tracie [name] noted a change in her  right breast sometime in July 2018. [facility] to report that she had found a "kernel" in her breast. She was advised to see her primary physician which she did. The patient was then set up for bilateral diagnostic mammography with tomography and right breast ultrasonography at [facility] 09/03/2017. The breast density was category I a. In the upper outer quadrant of the right breast there was a 2.1 cm high density mass, which was palpable. Also in the right breast more posteriorly there was a 1.5 cm high density mass which was felt to be a suspicious lymph node. Ultrasound of the right breast confirmed a 2.1 centimeter lobulated solid mass in the right breast upper outer quadrant 15.8 cm from the nipple in the 10:00 radiant. There was a 1.5 cm oval mass in the right axillary tail with a small rounded masses also suspicious for lymph node involvement. A total of 3 suspicious lymph nodes were identified.  On 09/03/2017 the patient underwent biopsy of the breast mass and the suspicious right axillary lymph node. The final pathology ([id number]) found invasive ductal carcinoma, grade 3, in both. Both tumors were estrogen receptor positive at 70-75%, and both were progesterone receptor negative. Both had an elevated proliferation marker at 90%. The mass in the breast was HER-2 negative, with a signals ratio of 1.25 and the number per cell 1.88. The lymph node mass however was HER-2 positive with a signals ratio of 2.57, and the number per cell 3.60.  The patient's subsequent history is as detailed below.   PAST MEDICAL HISTORY: Past Medical History:  Diagnosis Date  .  Angina pectoris ([place])    history of   . Arthritis   . Asthma   . Cancer ([place])    right breast  . Constipation   . Diverticulitis   . History of radiation therapy 04/07/18- 05/11/18   50 Gy in 25 fractions to right breast and regional nodes with four fields (no boost)  . History of radiation therapy 04/21/2018   T8 spine,  18 Gy in 1 fraction for a total dose of 18 Gy.   Marland Kitchen HPV in female   . Hyperlipidemia   . Hypertension   . Hypothyroidism   . Wears dentures   . Wears glasses     PAST SURGICAL HISTORY: Past Surgical History:  Procedure Laterality Date  . BREAST LUMPECTOMY WITH RADIOACTIVE SEED AND SENTINEL LYMPH NODE BIOPSY Right 03/08/2018   Procedure: RIGHT BREAST RADIOACTIVE SEED GUIDED LUMPECTOMY WITH RIGHT AXILLARY RADIOACTIVE SEED TARGETED LYMPH NODE EXCISION AND SENTINEL LYMPH NODE BIOPSY, INJECT BLUE DYE RIGHT BREAST;  Surgeon: [name], MD;  Location: [facility];  Service: General;  Laterality: Right;  . CESAREAN SECTION    . COLON SURGERY  2012   sigmoidectomy  . COLOSTOMY    . COLOSTOMY TAKEDOWN  10/06/11  . IR BONE TUMOR(S)RF ABLATION  11/02/2017  . IR KYPHO THORACIC WITH BONE BIOPSY  11/02/2017  . IR RADIOLOGIST EVAL & MGMT  10/07/2017  . IR RADIOLOGIST EVAL & MGMT  11/25/2017  . PORTACATH PLACEMENT N/A 09/25/2017   Procedure: INSERTION PORT-A-CATH WITH ULTRA SOUND ERAS PATHWAY;  Surgeon: [name], MD;  Location: [facility];  Service: General;  Laterality: N/A;  . TONSILLECTOMY    . TUBAL LIGATION      FAMILY HISTORY: Family History  Problem Relation Age of Onset  . Cancer Mother        rectal  . Hypertension Brother   . Hypertension Brother   . Asthma Son   . Asthma Brother    The patient has little information regarding her father. Her mother died at age 91 from a strange cancer--the patient does not know what it was, it may well have been cervical cancer from her description. The patient has one brother, no sisters. There is no history of breast or ovarian cancer in the family to the patient's knowledge    GYNECOLOGIC HISTORY:  No LMP recorded. Patient is postmenopausal.  menarche age 1, first live birth age 55 the patient is [place] P3. She stopped having periods at age 36. She never used oral contraceptives or hormone replacement.    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  She is currently working out of her home.  She is hoping to retire October 2020 she is divorced. Currently her son [name] stays with her sometimes. He is a Art gallery manager and therefore not working during the pandemic. The 2 other children are KEONTA MONCEAUX, who lives in [place] and works in the theater and media, and Rebeca Valdivia lives in [place], and is vice president of a [employer]. The patient has 5 grandchildren. She is a Psychologist, forensic.   ADVANCED DIRECTIVES: Not in place    HEALTH MAINTENANCE: Social History   Tobacco Use  . Smoking status: Current Some Day Smoker    Packs/day: 0.25    Years: 40.00    Pack years: 10.00    Types: Cigarettes  . Smokeless tobacco: Never Used  . Tobacco comment: she is smoking 2 or more daily, she plans to use  nicotine patch.   Substance Use Topics  . Alcohol use: Yes    Comment: occasional  . Drug use: No    Colonoscopy: September 2012   PAP:  Bone density:09/03/2017    Allergies  Allergen Reactions  . Fish Allergy Anaphylaxis and Swelling    Swelling of hands and feet., non shellfish allergy  . Other Anaphylaxis    Tree nuts   . Ace Inhibitors Other (See Comments) and Cough    Cold like symptoms   . Tylenol [Acetaminophen] Other (See Comments)    Headache     Current Outpatient Medications  Medication Sig Dispense Refill  . albuterol (PROAIR HFA) 108 (90 Base) MCG/ACT inhaler INHALE 2 PUFFS BY MOUTH INTO THE LUNGS EVERY 6 HOURS AS NEEDED FOR WHEEZING    . Alirocumab (PRALUENT) 75 MG/ML SOPN Inject 75 mg into the skin every 14 (fourteen) days. 2 pen 11  . amLODipine (NORVASC) 10 MG tablet Take 10 mg by mouth daily.     Marland Kitchen anastrozole (ARIMIDEX) 1 MG tablet Take 1 tablet (1 mg total) by mouth daily. 90 tablet 4  . aspirin EC 81 MG tablet Take 1 tablet (81 mg total) by mouth daily. 90 tablet 3  . cetirizine (ZYRTEC) 10 MG tablet Take 10 mg by mouth daily as needed for allergies.    11  . levothyroxine (SYNTHROID, LEVOTHROID) 50 MCG tablet Take 50 mcg by mouth daily.    Marland Kitchen lidocaine-prilocaine (EMLA) cream Apply 1 application topically as needed (port access). 30 g 0  . losartan-hydrochlorothiazide (HYZAAR) 100-12.5 MG tablet Take 1 tablet by mouth daily.   3  . metoprolol succinate (TOPROL XL) 25 MG 24 hr tablet Take 3 tablets (75 mg total) by mouth daily. 270 tablet 3  . mometasone-formoterol (DULERA) 100-5 MCG/ACT AERO Inhale 2 puffs into the lungs 2 (two) times daily. 3 Inhaler 0  . nicotine (NICODERM CQ - DOSED IN MG/24 HOURS) 14 mg/24hr patch Place 1 patch (14 mg total) onto the skin daily. 28 patch 0  . nitroGLYCERIN (NITROSTAT) 0.4 MG SL tablet Place 1 tablet (0.4 mg total) under the tongue every 5 (five) minutes as needed for chest pain. 75 tablet 3  . potassium chloride (K-DUR) 10 MEQ tablet TAKE 2 TABLETS(20 MEQ) BY MOUTH DAILY 60 tablet 0   No current facility-administered medications for this visit.     OBJECTIVE: Middle-aged African-American woman in no acute distress  Vitals:   07/06/19 1330  BP: (!) 156/80  Pulse: 85  Resp: 18  Temp: 98.7 F (37.1 C)  SpO2: 97%     Body mass index is 31.25 kg/m.   Wt Readings from Last 3 Encounters:  07/06/19 176 lb 6.4 oz (80 kg)  04/12/19 173 lb 1.6 oz (78.5 kg)  02/02/19 162 lb 9.6 oz (73.8 kg)   ECOG FS:1   Sclerae unicteric, EOMs intact Wearing a mask No cervical or supraclavicular adenopathy Lungs no rales or rhonchi Heart regular rate and rhythm Abd soft, nontender, positive bowel sounds MSK no focal spinal tenderness, no upper extremity lymphedema Neuro: nonfocal, well oriented, appropriate affect Breasts: The right breast is status post lumpectomy and radiation.  There is some coarsening of the skin as expected but no evidence of local recurrence.  The left breast is unremarkable.  Both axillae are benign.  LAB RESULTS:  CMP     Component Value Date/Time   NA 138 06/08/2019 1400   NA 138  12/24/2017 0752   K 3.8 06/08/2019 1400  K 3.3 (L) 12/24/2017 0752   CL 102 06/08/2019 1400   CO2 25 06/08/2019 1400   CO2 25 12/24/2017 0752   GLUCOSE 97 06/08/2019 1400   GLUCOSE 99 12/24/2017 0752   BUN 12 06/08/2019 1400   BUN 7.9 12/24/2017 0752   CREATININE 0.83 06/08/2019 1400   CREATININE 0.7 12/24/2017 0752   CALCIUM 10.2 06/08/2019 1400   CALCIUM 9.2 12/24/2017 0752   PROT 7.4 06/08/2019 1400   PROT 6.3 (L) 12/24/2017 0752   ALBUMIN 3.6 06/08/2019 1400   ALBUMIN 3.4 (L) 12/24/2017 0752   AST 16 06/08/2019 1400   AST 15 12/24/2017 0752   ALT 16 06/08/2019 1400   ALT 18 12/24/2017 0752   ALKPHOS 85 06/08/2019 1400   ALKPHOS 93 12/24/2017 0752   BILITOT <0.2 (L) 06/08/2019 1400   BILITOT <0.22 12/24/2017 0752   GFRNONAA >60 06/08/2019 1400   GFRAA >60 06/08/2019 1400    No results found for: Ronnald Ramp, A1GS, A2GS, BETS, BETA2SER, GAMS, MSPIKE, SPEI  No results found for: Nils Pyle, [facility]  Lab Results  Component Value Date   WBC 4.7 07/06/2019   NEUTROABS 2.8 07/06/2019   HGB 11.9 (L) 07/06/2019   HCT 36.4 07/06/2019   MCV 88.8 07/06/2019   PLT 297 07/06/2019      Chemistry      Component Value Date/Time   NA 138 06/08/2019 1400   NA 138 12/24/2017 0752   K 3.8 06/08/2019 1400   K 3.3 (L) 12/24/2017 0752   CL 102 06/08/2019 1400   CO2 25 06/08/2019 1400   CO2 25 12/24/2017 0752   BUN 12 06/08/2019 1400   BUN 7.9 12/24/2017 0752   CREATININE 0.83 06/08/2019 1400   CREATININE 0.7 12/24/2017 0752      Component Value Date/Time   CALCIUM 10.2 06/08/2019 1400   CALCIUM 9.2 12/24/2017 0752   ALKPHOS 85 06/08/2019 1400   ALKPHOS 93 12/24/2017 0752   AST 16 06/08/2019 1400   AST 15 12/24/2017 0752   ALT 16 06/08/2019 1400   ALT 18 12/24/2017 0752   BILITOT <0.2 (L) 06/08/2019 1400   BILITOT <0.22 12/24/2017 0752       No results found for: LABCA2  No components found for: [id number]    Appointment on  07/06/2019  Component Date Value Ref Range Status  . WBC Count 07/06/2019 4.7  4.0 - 10.5 K/uL Final  . RBC 07/06/2019 4.10  3.87 - 5.11 MIL/uL Final  . Hemoglobin 07/06/2019 11.9* 12.0 - 15.0 g/dL Final  . HCT 07/06/2019 36.4  36.0 - 46.0 % Final  . MCV 07/06/2019 88.8  80.0 - 100.0 fL Final  . MCH 07/06/2019 29.0  26.0 - 34.0 pg Final  . MCHC 07/06/2019 32.7  30.0 - 36.0 g/dL Final  . RDW 07/06/2019 17.6* 11.5 - 15.5 % Final  . Platelet Count 07/06/2019 297  150 - 400 K/uL Final  . nRBC 07/06/2019 0.0  0.0 - 0.2 % Final  . Neutrophils Relative % 07/06/2019 60  % Final  . Neutro Abs 07/06/2019 2.8  1.7 - 7.7 K/uL Final  . Lymphocytes Relative 07/06/2019 23  % Final  . Lymphs Abs 07/06/2019 1.1  0.7 - 4.0 K/uL Final  . Monocytes Relative 07/06/2019 12  % Final  . Monocytes Absolute 07/06/2019 0.5  0.1 - 1.0 K/uL Final  . Eosinophils Relative 07/06/2019 4  % Final  . Eosinophils Absolute 07/06/2019 0.2  0.0 - 0.5 K/uL Final  . Basophils Relative  07/06/2019 1  % Final  . Basophils Absolute 07/06/2019 0.0  0.0 - 0.1 K/uL Final  . Immature Granulocytes 07/06/2019 0  % Final  . Abs Immature Granulocytes 07/06/2019 0.02  0.00 - 0.07 K/uL Final   Performed at [facility], [address] [place], [place] [address]    (this displays the last labs from the last 3 days)  No results found for: TOTALPROTELP, ALBUMINELP, A1GS, A2GS, BETS, BETA2SER, GAMS, MSPIKE, SPEI (this displays SPEP labs)  No results found for: KPAFRELGTCHN, LAMBDASER, KAPLAMBRATIO (kappa/lambda light chains)  No results found for: HGBA, HGBA2QUANT, HGBFQUANT, HGBSQUAN (Hemoglobinopathy evaluation)   No results found for: LDH  No results found for: IRON, TIBC, IRONPCTSAT (Iron and TIBC)  No results found for: FERRITIN  Urinalysis    Component Value Date/Time   LABSPEC 1.020 02/10/2011 2058   PHURINE 6.0 02/10/2011 2058   HGBUR LARGE (A) 02/10/2011 2058   BILIRUBINUR MODERATE (A)  02/10/2011 2058   KETONESUR 15 (A) 02/10/2011 2058   PROTEINUR >=300 (A) 02/10/2011 2058   UROBILINOGEN >=8.0 02/10/2011 2058   NITRITE NEGATIVE 02/10/2011 2058   LEUKOCYTESUR  02/10/2011 2058    NEGATIVE Biochemical Testing Only. Please order routine urinalysis from main lab if confirmatory testing is needed.     STUDIES: No results found.  ELIGIBLE FOR AVAILABLE RESEARCH PROTOCOL: no   ASSESSMENT: 68 y.o. [place] woman status post right breast upper outer quadrant and right axillary lymph node biopsy 09/03/2017, both positive for invasive ductal carcinoma, grade 3, estrogen receptor positive, progesterone receptor negative, with an MIB-1 of 90%, the lymph node being HER-2 positive, the breast mass HER-2 negative  (a) staging studies September 24, 2017 show a lytic lesion in T8, possible areas of concern at L1-L2  (b) biopsy/kyphoplasty/osteocool of T8 lesion 11/02/2017 confirms metastatic adenocarcinoma, 20% estrogen receptor positive, estrogen receptor negative  (c) PET scan 11/30/2017 shows no visceral disease, minimal metabolic activity at T8, no other bone lesions  (1) neoadjuvant chemotherapy with carboplatin, docetaxel, trastuzumab, and pertuzumab started 10/01/2017, completed 6 cycles 01/26/2018  (a) Docetaxel dropped after three cycles and Gemcitabine/Carbo started on 12/15/2017.   (2) trastuzumab to continue indefinitely  (a) echocardiogram 11/38/2018 shows an ejection fraction in the 55-60% range  (b) echocardiogram 06/17/2019 shows an ejection fraction in the 50-55% range  (3) right lumpectomy and sentinel lymph node sampling 03/08/2018  Showed a pT1(mic) pN0 invasive ductal carcinoma, grade 2, with negative margins.  (4) adjuvant radiation  04-07-18 to 05-11-18                                              (a) SITE/DOSE:  Right Breast and Axilla, SCV nodes / 50 Gy in 25 fx               (b) radiation to her oligo metastatic disease at T8 (18 Gy) in 1 fraction,  04/21/2018  (5) anastrozole started 05/29/2018  (a) bone density test pending  (6) zolendronate started 01/19/2019, repeated every 12 weeks   PLAN:  Tracie [name] is now a year and a half out from definitive surgery for her breast cancer with no evidence of disease recurrence.  This is favorable.  She is tolerating trastuzumab well, with no significant drop in her ejection fraction.  We are following that every 3 months.  She is also doing well on her anastrozole.  I am adding a bone density to her next mammogram, which will be in December  She is having some discomfort in the right hip area.  I will obtain plain right hip films today.  Otherwise we will continue to see her every 3 months and every time she receives zoledronate.  She will have lab work each 1 of those locations as well  She knows to call for any other issue that may develop before the next visit.Jana Hakim, [name], MD  07/06/19 1:52 PM Medical Oncology and Hematology [facility] [address] [place], [place] [address] Tel. [phone]    Fax. [phone]    I, Wilburn Mylar, am acting as scribe for Dr. [name]. Magrinat.  I, Lurline Del MD, have reviewed the above documentation for accuracy and completeness, and I agree with the above.

## 2019-07-07 ENCOUNTER — Telehealth: Payer: Self-pay | Admitting: Oncology

## 2019-07-07 NOTE — Telephone Encounter (Signed)
I could not reach patient regarding schedule  °

## 2019-07-12 ENCOUNTER — Encounter: Payer: Self-pay | Admitting: Oncology

## 2019-07-19 ENCOUNTER — Ambulatory Visit
Admission: RE | Admit: 2019-07-19 | Discharge: 2019-07-19 | Disposition: A | Discharge status: Home / Self Care | Payer: Managed Care, Other (non HMO) | Source: Ambulatory Visit | Attending: Oncology | Admitting: Oncology

## 2019-07-19 ENCOUNTER — Telehealth: Payer: Self-pay

## 2019-07-19 ENCOUNTER — Other Ambulatory Visit: Payer: Self-pay

## 2019-07-19 DIAGNOSIS — Z170 Estrogen receptor positive status [ER+]: Secondary | ICD-10-CM

## 2019-07-19 DIAGNOSIS — C50411 Malignant neoplasm of upper-outer quadrant of right female breast: Secondary | ICD-10-CM

## 2019-07-19 NOTE — Telephone Encounter (Signed)
Left VM for patient to call back

## 2019-07-19 NOTE — Telephone Encounter (Signed)
Patient returned nurse call.  Informed her that bone density is normal.  Patient voiced understanding and thanks.

## 2019-07-19 NOTE — Telephone Encounter (Signed)
-----   Message from Gardenia Phlegm, NP sent at 07/19/2019  1:47 PM EDT ----- Please call patient and let her know that her bone density is normal ----- Message ----- From: Interface, Rad Results In Sent: 07/19/2019  12:47 PM EDT To: Chauncey Cruel, MD

## 2019-07-24 ENCOUNTER — Other Ambulatory Visit: Payer: Self-pay | Admitting: Oncology

## 2019-07-27 ENCOUNTER — Inpatient Hospital Stay: Payer: Managed Care, Other (non HMO)

## 2019-07-27 ENCOUNTER — Other Ambulatory Visit: Payer: Self-pay

## 2019-07-27 ENCOUNTER — Telehealth: Payer: Self-pay

## 2019-07-27 ENCOUNTER — Other Ambulatory Visit: Payer: Self-pay | Admitting: Hematology

## 2019-07-27 ENCOUNTER — Other Ambulatory Visit: Payer: Self-pay | Admitting: Oncology

## 2019-07-27 ENCOUNTER — Ambulatory Visit (HOSPITAL_COMMUNITY)
Admission: RE | Admit: 2019-07-27 | Discharge: 2019-07-27 | Disposition: A | Discharge status: Home / Self Care | Payer: Managed Care, Other (non HMO) | Source: Ambulatory Visit | Attending: Oncology | Admitting: Oncology

## 2019-07-27 VITALS — BP 154/86 | HR 78 | Temp 98.9°F | Resp 16 | Ht 63.0 in

## 2019-07-27 DIAGNOSIS — C7951 Secondary malignant neoplasm of bone: Secondary | ICD-10-CM

## 2019-07-27 DIAGNOSIS — C50411 Malignant neoplasm of upper-outer quadrant of right female breast: Secondary | ICD-10-CM | POA: Diagnosis present

## 2019-07-27 DIAGNOSIS — Z170 Estrogen receptor positive status [ER+]: Secondary | ICD-10-CM | POA: Insufficient documentation

## 2019-07-27 DIAGNOSIS — Z95828 Presence of other vascular implants and grafts: Secondary | ICD-10-CM

## 2019-07-27 LAB — CBC WITH DIFFERENTIAL (CANCER CENTER ONLY)
Abs Immature Granulocytes: 0.03 10*3/uL (ref 0.00–0.07)
Basophils Absolute: 0 10*3/uL (ref 0.0–0.1)
Basophils Relative: 0 %
Eosinophils Absolute: 0.2 10*3/uL (ref 0.0–0.5)
Eosinophils Relative: 4 %
HCT: 36.8 % (ref 36.0–46.0)
Hemoglobin: 11.9 g/dL — ABNORMAL LOW (ref 12.0–15.0)
Immature Granulocytes: 1 %
Lymphocytes Relative: 22 %
Lymphs Abs: 0.9 10*3/uL (ref 0.7–4.0)
MCH: 28.7 pg (ref 26.0–34.0)
MCHC: 32.3 g/dL (ref 30.0–36.0)
MCV: 88.9 fL (ref 80.0–100.0)
Monocytes Absolute: 0.5 10*3/uL (ref 0.1–1.0)
Monocytes Relative: 12 %
Neutro Abs: 2.6 10*3/uL (ref 1.7–7.7)
Neutrophils Relative %: 61 %
Platelet Count: 309 10*3/uL (ref 150–400)
RBC: 4.14 MIL/uL (ref 3.87–5.11)
RDW: 18.2 % — ABNORMAL HIGH (ref 11.5–15.5)
WBC Count: 4.2 10*3/uL (ref 4.0–10.5)
nRBC: 0 % (ref 0.0–0.2)

## 2019-07-27 LAB — CMP (CANCER CENTER ONLY)
ALT: 16 U/L (ref 0–44)
AST: 14 U/L — ABNORMAL LOW (ref 15–41)
Albumin: 3.4 g/dL — ABNORMAL LOW (ref 3.5–5.0)
Alkaline Phosphatase: 91 U/L (ref 38–126)
Anion gap: 8 (ref 5–15)
BUN: 12 mg/dL (ref 8–23)
CO2: 28 mmol/L (ref 22–32)
Calcium: 9.1 mg/dL (ref 8.9–10.3)
Chloride: 104 mmol/L (ref 98–111)
Creatinine: 0.87 mg/dL (ref 0.44–1.00)
GFR, Est AFR Am: >60 mL/min (ref >60)
GFR, Estimated: >60 mL/min (ref >60)
Glucose, Bld: 104 mg/dL — ABNORMAL HIGH (ref 70–99)
Potassium: 3.7 mmol/L (ref 3.5–5.1)
Sodium: 140 mmol/L (ref 135–145)
Total Bilirubin: <0.2 mg/dL — ABNORMAL LOW (ref 0.3–1.2)
Total Protein: 6.9 g/dL (ref 6.5–8.1)

## 2019-07-27 MED ORDER — ACETAMINOPHEN 325 MG PO TABS
ORAL_TABLET | ORAL | Status: AC
  Filled 2019-07-27: qty 2

## 2019-07-27 MED ORDER — TRASTUZUMAB CHEMO 150 MG IV SOLR
450.0000 mg | Freq: Once | INTRAVENOUS | Status: AC
  Administered 2019-07-27: 450 mg via INTRAVENOUS
  Filled 2019-07-27: qty 21.43

## 2019-07-27 MED ORDER — SODIUM CHLORIDE 0.9% FLUSH
10.0000 mL | INTRAVENOUS | Status: DC | PRN
  Administered 2019-07-27: 10 mL
  Filled 2019-07-27: qty 10

## 2019-07-27 MED ORDER — DIPHENHYDRAMINE HCL 25 MG PO CAPS
25.0000 mg | ORAL_CAPSULE | Freq: Once | ORAL | Status: AC
  Administered 2019-07-27: 25 mg via ORAL

## 2019-07-27 MED ORDER — SODIUM CHLORIDE 0.9% FLUSH
10.0000 mL | Freq: Once | INTRAVENOUS | Status: AC
  Administered 2019-07-27: 10 mL
  Filled 2019-07-27: qty 10

## 2019-07-27 MED ORDER — SODIUM CHLORIDE 0.9 % IV SOLN
Freq: Once | INTRAVENOUS | Status: AC
  Administered 2019-07-27: 14:00:00 via INTRAVENOUS
  Filled 2019-07-27: qty 250

## 2019-07-27 MED ORDER — DIPHENHYDRAMINE HCL 25 MG PO CAPS
ORAL_CAPSULE | ORAL | Status: AC
  Filled 2019-07-27: qty 1

## 2019-07-27 MED ORDER — HEPARIN SOD (PORK) LOCK FLUSH 100 UNIT/ML IV SOLN
500.0000 [IU] | Freq: Once | INTRAVENOUS | Status: AC | PRN
  Administered 2019-07-27: 500 [IU]
  Filled 2019-07-27: qty 5

## 2019-07-27 MED ORDER — ACETAMINOPHEN 325 MG PO TABS
650.0000 mg | ORAL_TABLET | Freq: Once | ORAL | Status: AC
  Administered 2019-07-27: 15:00:00 650 mg via ORAL

## 2019-07-27 NOTE — Telephone Encounter (Signed)
Spoke with pt in lobby regarding vascular ultrasound of right upper extremity.   Pt informed no thrombus was seen. Pt instructed to continue to monitor for any worsening edema, redness or warm to touch, any swelling in the arm or hand and any numbness or tingling in that extremity. Pt verbalizes understanding of instructions.

## 2019-07-27 NOTE — Progress Notes (Unsigned)
[name] complains of right upper extremity swelling and discomfort, which is now less apparent.  We are concerned about the possibility of a DVT there and we will try to obtain a Doppler today.  If we cannot obtain it today she will receive Lovenox 1.5 mg/kg today and then it would be done tomorrow.

## 2019-07-27 NOTE — Progress Notes (Signed)
Right upper extremity venous duplex completed. Preliminary results n Chart review CV Proc [place] Robyn Nohr,RVS 07/27/2019 4:26 PM

## 2019-07-27 NOTE — Progress Notes (Signed)
Patient presents to infusion room with c/o right-sided supraclavicular "swelling" x 3 days. Denies injury, pain, or dyspnea. Area is soft to touch. No obvious swelling noted on visual assessment. Patient denies right arm swelling or facial pain/swelling. Notified Rogue Jury, RN who will notify Dr. Jana Hakim. Spoke with Dr. Burr Medico about patient complaint. Dr. Burr Medico came to treatment room to evaluate and advised ok to proceed with treatment. Will schedule patient for doppler. Patient aware of plan of care and agrees.

## 2019-08-15 ENCOUNTER — Other Ambulatory Visit: Payer: Self-pay | Admitting: Oncology

## 2019-08-17 ENCOUNTER — Other Ambulatory Visit: Payer: Self-pay

## 2019-08-17 ENCOUNTER — Inpatient Hospital Stay: Payer: Managed Care, Other (non HMO) | Attending: Oncology

## 2019-08-17 ENCOUNTER — Inpatient Hospital Stay: Payer: Managed Care, Other (non HMO)

## 2019-08-17 VITALS — BP 126/78 | HR 79 | Temp 99.1°F | Resp 18

## 2019-08-17 DIAGNOSIS — Z79811 Long term (current) use of aromatase inhibitors: Secondary | ICD-10-CM | POA: Insufficient documentation

## 2019-08-17 DIAGNOSIS — I10 Essential (primary) hypertension: Secondary | ICD-10-CM | POA: Insufficient documentation

## 2019-08-17 DIAGNOSIS — Z95828 Presence of other vascular implants and grafts: Secondary | ICD-10-CM

## 2019-08-17 DIAGNOSIS — C7951 Secondary malignant neoplasm of bone: Secondary | ICD-10-CM

## 2019-08-17 DIAGNOSIS — C50411 Malignant neoplasm of upper-outer quadrant of right female breast: Secondary | ICD-10-CM

## 2019-08-17 DIAGNOSIS — Z5112 Encounter for antineoplastic immunotherapy: Secondary | ICD-10-CM | POA: Diagnosis not present

## 2019-08-17 DIAGNOSIS — Z170 Estrogen receptor positive status [ER+]: Secondary | ICD-10-CM | POA: Insufficient documentation

## 2019-08-17 DIAGNOSIS — Z79899 Other long term (current) drug therapy: Secondary | ICD-10-CM | POA: Insufficient documentation

## 2019-08-17 DIAGNOSIS — E860 Dehydration: Secondary | ICD-10-CM

## 2019-08-17 LAB — CMP (CANCER CENTER ONLY)
ALT: 14 U/L (ref 0–44)
AST: 12 U/L — ABNORMAL LOW (ref 15–41)
Albumin: 3.5 g/dL (ref 3.5–5.0)
Alkaline Phosphatase: 84 U/L (ref 38–126)
Anion gap: 10 (ref 5–15)
BUN: 16 mg/dL (ref 8–23)
CO2: 25 mmol/L (ref 22–32)
Calcium: 9 mg/dL (ref 8.9–10.3)
Chloride: 106 mmol/L (ref 98–111)
Creatinine: 0.81 mg/dL (ref 0.44–1.00)
GFR, Est AFR Am: >60 mL/min (ref >60)
GFR, Estimated: >60 mL/min (ref >60)
Glucose, Bld: 98 mg/dL (ref 70–99)
Potassium: 3.5 mmol/L (ref 3.5–5.1)
Sodium: 141 mmol/L (ref 135–145)
Total Bilirubin: <0.2 mg/dL — ABNORMAL LOW (ref 0.3–1.2)
Total Protein: 7 g/dL (ref 6.5–8.1)

## 2019-08-17 LAB — CBC WITH DIFFERENTIAL (CANCER CENTER ONLY)
Abs Immature Granulocytes: 0.01 10*3/uL (ref 0.00–0.07)
Basophils Absolute: 0 10*3/uL (ref 0.0–0.1)
Basophils Relative: 1 %
Eosinophils Absolute: 0.2 10*3/uL (ref 0.0–0.5)
Eosinophils Relative: 4 %
HCT: 36.7 % (ref 36.0–46.0)
Hemoglobin: 11.8 g/dL — ABNORMAL LOW (ref 12.0–15.0)
Immature Granulocytes: 0 %
Lymphocytes Relative: 26 %
Lymphs Abs: 1.1 10*3/uL (ref 0.7–4.0)
MCH: 29.3 pg (ref 26.0–34.0)
MCHC: 32.2 g/dL (ref 30.0–36.0)
MCV: 91.1 fL (ref 80.0–100.0)
Monocytes Absolute: 0.5 10*3/uL (ref 0.1–1.0)
Monocytes Relative: 12 %
Neutro Abs: 2.6 10*3/uL (ref 1.7–7.7)
Neutrophils Relative %: 57 %
Platelet Count: 332 10*3/uL (ref 150–400)
RBC: 4.03 MIL/uL (ref 3.87–5.11)
RDW: 18.1 % — ABNORMAL HIGH (ref 11.5–15.5)
WBC Count: 4.4 10*3/uL (ref 4.0–10.5)
nRBC: 0 % (ref 0.0–0.2)

## 2019-08-17 MED ORDER — HEPARIN SOD (PORK) LOCK FLUSH 100 UNIT/ML IV SOLN
500.0000 [IU] | Freq: Once | INTRAVENOUS | Status: AC | PRN
  Administered 2019-08-17: 500 [IU]
  Filled 2019-08-17: qty 5

## 2019-08-17 MED ORDER — SODIUM CHLORIDE 0.9 % IV SOLN
Freq: Once | INTRAVENOUS | Status: AC
  Administered 2019-08-17: 14:00:00 via INTRAVENOUS
  Filled 2019-08-17: qty 250

## 2019-08-17 MED ORDER — DIPHENHYDRAMINE HCL 25 MG PO CAPS
25.0000 mg | ORAL_CAPSULE | Freq: Once | ORAL | Status: AC
  Administered 2019-08-17: 14:00:00 25 mg via ORAL

## 2019-08-17 MED ORDER — SODIUM CHLORIDE 0.9% FLUSH
10.0000 mL | Freq: Once | INTRAVENOUS | Status: AC
  Administered 2019-08-17: 10 mL
  Filled 2019-08-17: qty 10

## 2019-08-17 MED ORDER — TRASTUZUMAB CHEMO 150 MG IV SOLR
450.0000 mg | Freq: Once | INTRAVENOUS | Status: AC
  Administered 2019-08-17: 450 mg via INTRAVENOUS
  Filled 2019-08-17: qty 21.43

## 2019-08-17 MED ORDER — SODIUM CHLORIDE 0.9% FLUSH
10.0000 mL | INTRAVENOUS | Status: DC | PRN
  Administered 2019-08-17: 10 mL
  Filled 2019-08-17: qty 10

## 2019-08-17 MED ORDER — ACETAMINOPHEN 325 MG PO TABS
650.0000 mg | ORAL_TABLET | Freq: Once | ORAL | Status: AC
  Administered 2019-08-17: 650 mg via ORAL

## 2019-08-17 MED ORDER — SODIUM CHLORIDE 0.9 % IV SOLN
INTRAVENOUS | Status: AC
  Administered 2019-08-17: 14:00:00 via INTRAVENOUS
  Filled 2019-08-17 (×2): qty 250

## 2019-08-17 MED ORDER — DIPHENHYDRAMINE HCL 25 MG PO CAPS
ORAL_CAPSULE | ORAL | Status: AC
  Filled 2019-08-17: qty 1

## 2019-08-17 MED ORDER — ACETAMINOPHEN 325 MG PO TABS
ORAL_TABLET | ORAL | Status: AC
  Filled 2019-08-17: qty 2

## 2019-08-17 NOTE — Patient Instructions (Signed)
[facility] Discharge Instructions for Patients Receiving Chemotherapy  Today you received the following chemotherapy agents: Trastuzumab (Herceptin)  To help prevent nausea and vomiting after your treatment, we encourage you to take your nausea medication as directed.    If you develop nausea and vomiting that is not controlled by your nausea medication, call the clinic.   BELOW ARE SYMPTOMS THAT SHOULD BE REPORTED IMMEDIATELY:  *FEVER GREATER THAN 100.5 F  *CHILLS WITH OR WITHOUT FEVER  NAUSEA AND VOMITING THAT IS NOT CONTROLLED WITH YOUR NAUSEA MEDICATION  *UNUSUAL SHORTNESS OF BREATH  *UNUSUAL BRUISING OR BLEEDING  TENDERNESS IN MOUTH AND THROAT WITH OR WITHOUT PRESENCE OF ULCERS  *URINARY PROBLEMS  *BOWEL PROBLEMS  UNUSUAL RASH Items with * indicate a potential emergency and should be followed up as soon as possible.  Feel free to call the clinic should you have any questions or concerns. The clinic phone number is [phone].  Please show the CHEMO ALERT CARD at check-in to the Emergency Department and triage nurse.    

## 2019-08-17 NOTE — Patient Instructions (Signed)

## 2019-08-25 ENCOUNTER — Telehealth: Payer: Self-pay | Admitting: Oncology

## 2019-08-25 NOTE — Telephone Encounter (Signed)
Called patient regarding infusion log, informed patient that 09/09 appointment time has changed.

## 2019-09-07 ENCOUNTER — Other Ambulatory Visit: Payer: Managed Care, Other (non HMO)

## 2019-09-07 ENCOUNTER — Inpatient Hospital Stay: Payer: Managed Care, Other (non HMO)

## 2019-09-07 ENCOUNTER — Inpatient Hospital Stay: Payer: Managed Care, Other (non HMO) | Attending: Oncology

## 2019-09-07 ENCOUNTER — Other Ambulatory Visit: Payer: Self-pay

## 2019-09-07 ENCOUNTER — Other Ambulatory Visit: Payer: Self-pay | Admitting: *Deleted

## 2019-09-07 ENCOUNTER — Ambulatory Visit: Payer: Managed Care, Other (non HMO)

## 2019-09-07 VITALS — BP 172/79 | HR 87 | Temp 98.5°F | Resp 16 | Wt 175.5 lb

## 2019-09-07 DIAGNOSIS — Z170 Estrogen receptor positive status [ER+]: Secondary | ICD-10-CM | POA: Diagnosis not present

## 2019-09-07 DIAGNOSIS — Z5112 Encounter for antineoplastic immunotherapy: Secondary | ICD-10-CM | POA: Diagnosis not present

## 2019-09-07 DIAGNOSIS — C50411 Malignant neoplasm of upper-outer quadrant of right female breast: Secondary | ICD-10-CM

## 2019-09-07 DIAGNOSIS — C7951 Secondary malignant neoplasm of bone: Secondary | ICD-10-CM | POA: Diagnosis not present

## 2019-09-07 DIAGNOSIS — Z79899 Other long term (current) drug therapy: Secondary | ICD-10-CM | POA: Insufficient documentation

## 2019-09-07 DIAGNOSIS — Z95828 Presence of other vascular implants and grafts: Secondary | ICD-10-CM

## 2019-09-07 LAB — CMP (CANCER CENTER ONLY)
ALT: 11 U/L (ref 0–44)
AST: 13 U/L — ABNORMAL LOW (ref 15–41)
Albumin: 3.4 g/dL — ABNORMAL LOW (ref 3.5–5.0)
Alkaline Phosphatase: 96 U/L (ref 38–126)
Anion gap: 9 (ref 5–15)
BUN: 17 mg/dL (ref 8–23)
CO2: 27 mmol/L (ref 22–32)
Calcium: 9 mg/dL (ref 8.9–10.3)
Chloride: 108 mmol/L (ref 98–111)
Creatinine: 0.95 mg/dL (ref 0.44–1.00)
GFR, Est AFR Am: >60 mL/min (ref >60)
GFR, Estimated: >60 mL/min (ref >60)
Glucose, Bld: 118 mg/dL — ABNORMAL HIGH (ref 70–99)
Potassium: 3.6 mmol/L (ref 3.5–5.1)
Sodium: 144 mmol/L (ref 135–145)
Total Bilirubin: <0.2 mg/dL — ABNORMAL LOW (ref 0.3–1.2)
Total Protein: 6.8 g/dL (ref 6.5–8.1)

## 2019-09-07 LAB — CBC WITH DIFFERENTIAL (CANCER CENTER ONLY)
Abs Immature Granulocytes: 0.02 10*3/uL (ref 0.00–0.07)
Basophils Absolute: 0 10*3/uL (ref 0.0–0.1)
Basophils Relative: 1 %
Eosinophils Absolute: 0.2 10*3/uL (ref 0.0–0.5)
Eosinophils Relative: 6 %
HCT: 36.2 % (ref 36.0–46.0)
Hemoglobin: 11.5 g/dL — ABNORMAL LOW (ref 12.0–15.0)
Immature Granulocytes: 1 %
Lymphocytes Relative: 25 %
Lymphs Abs: 1.1 10*3/uL (ref 0.7–4.0)
MCH: 29.3 pg (ref 26.0–34.0)
MCHC: 31.8 g/dL (ref 30.0–36.0)
MCV: 92.3 fL (ref 80.0–100.0)
Monocytes Absolute: 0.5 10*3/uL (ref 0.1–1.0)
Monocytes Relative: 12 %
Neutro Abs: 2.4 10*3/uL (ref 1.7–7.7)
Neutrophils Relative %: 55 %
Platelet Count: 293 10*3/uL (ref 150–400)
RBC: 3.92 MIL/uL (ref 3.87–5.11)
RDW: 18.1 % — ABNORMAL HIGH (ref 11.5–15.5)
WBC Count: 4.2 10*3/uL (ref 4.0–10.5)
nRBC: 0 % (ref 0.0–0.2)

## 2019-09-07 MED ORDER — LIDOCAINE-PRILOCAINE 2.5-2.5 % EX CREA: 1.0000 "application " | TOPICAL_CREAM | CUTANEOUS | 0 refills | Status: DC | PRN

## 2019-09-07 MED ORDER — HEPARIN SOD (PORK) LOCK FLUSH 100 UNIT/ML IV SOLN
500.0000 [IU] | Freq: Once | INTRAVENOUS | Status: AC | PRN
  Administered 2019-09-07: 500 [IU]
  Filled 2019-09-07: qty 5

## 2019-09-07 MED ORDER — SODIUM CHLORIDE 0.9 % IV SOLN
Freq: Once | INTRAVENOUS | Status: AC
  Administered 2019-09-07: 15:00:00 via INTRAVENOUS
  Filled 2019-09-07: qty 250

## 2019-09-07 MED ORDER — TRASTUZUMAB CHEMO 150 MG IV SOLR
450.0000 mg | Freq: Once | INTRAVENOUS | Status: AC
  Administered 2019-09-07: 450 mg via INTRAVENOUS
  Filled 2019-09-07: qty 21.43

## 2019-09-07 MED ORDER — SODIUM CHLORIDE 0.9% FLUSH
10.0000 mL | INTRAVENOUS | Status: DC | PRN
  Administered 2019-09-07: 10 mL
  Filled 2019-09-07: qty 10

## 2019-09-07 MED ORDER — ACETAMINOPHEN 325 MG PO TABS
ORAL_TABLET | ORAL | Status: AC
  Filled 2019-09-07: qty 2

## 2019-09-07 MED ORDER — SODIUM CHLORIDE 0.9% FLUSH
10.0000 mL | Freq: Once | INTRAVENOUS | Status: AC
  Administered 2019-09-07: 10 mL
  Filled 2019-09-07: qty 10

## 2019-09-07 MED ORDER — ACETAMINOPHEN 325 MG PO TABS
650.0000 mg | ORAL_TABLET | Freq: Once | ORAL | Status: AC
  Administered 2019-09-07: 650 mg via ORAL

## 2019-09-07 MED ORDER — DIPHENHYDRAMINE HCL 25 MG PO CAPS
25.0000 mg | ORAL_CAPSULE | Freq: Once | ORAL | Status: AC
  Administered 2019-09-07: 25 mg via ORAL

## 2019-09-07 MED ORDER — DIPHENHYDRAMINE HCL 25 MG PO CAPS
ORAL_CAPSULE | ORAL | Status: AC
  Filled 2019-09-07: qty 1

## 2019-09-07 NOTE — Progress Notes (Signed)
Biosimilar change from herceptin to ogivri

## 2019-09-19 ENCOUNTER — Telehealth: Payer: Self-pay | Admitting: *Deleted

## 2019-09-19 NOTE — Telephone Encounter (Signed)
Pt will have next Echo and visit with Dr Aundra Dubin in November for continued monitoring per trastuzumab therapy.  MD aware and approves of above plan

## 2019-09-27 ENCOUNTER — Telehealth: Payer: Self-pay | Admitting: *Deleted

## 2019-09-27 NOTE — Telephone Encounter (Signed)
This RN spoke with pt per her VM stating she would like to not come in tomorrow due to recovering from a cold.   She states congestion and cough now with clear production.  She denies any fevers.  Tracie [name] states " I just do not want to come in coughing to the office ".  Per MD review of above - request for appointments to be rescheduled to next week and then reschedule following appointments accordingly for treatment and visit every 21 days.  Above discussed with patient and appointment request sent to scheduling per Urgent request.

## 2019-09-28 ENCOUNTER — Inpatient Hospital Stay: Payer: Managed Care, Other (non HMO)

## 2019-09-28 ENCOUNTER — Telehealth: Payer: Self-pay | Admitting: Oncology

## 2019-09-28 ENCOUNTER — Inpatient Hospital Stay: Payer: Managed Care, Other (non HMO) | Admitting: Adult Health

## 2019-09-28 NOTE — Telephone Encounter (Signed)
R/s appt per 9/29 sch message - pt aware of new appt date and time

## 2019-09-30 NOTE — Progress Notes (Signed)
Note: pt will continue to receive Herceptin, not the biosimilar.  Insurance had approved Herceptin. She never got any doses of biosimilar.  Kennith Center, Pharm.D., CPP 09/30/2019@9 :12 AM

## 2019-10-06 ENCOUNTER — Inpatient Hospital Stay: Payer: Medicare Other

## 2019-10-06 ENCOUNTER — Encounter: Payer: Self-pay | Admitting: Adult Health

## 2019-10-06 ENCOUNTER — Inpatient Hospital Stay: Payer: Medicare Other | Attending: Adult Health

## 2019-10-06 ENCOUNTER — Inpatient Hospital Stay (HOSPITAL_BASED_OUTPATIENT_CLINIC_OR_DEPARTMENT_OTHER): Payer: Medicare Other | Admitting: Adult Health

## 2019-10-06 ENCOUNTER — Other Ambulatory Visit: Payer: Self-pay

## 2019-10-06 VITALS — BP 143/75 | HR 88 | Temp 98.9°F | Resp 18 | Ht 63.0 in | Wt 172.9 lb

## 2019-10-06 DIAGNOSIS — Z170 Estrogen receptor positive status [ER+]: Secondary | ICD-10-CM

## 2019-10-06 DIAGNOSIS — Z79899 Other long term (current) drug therapy: Secondary | ICD-10-CM | POA: Diagnosis not present

## 2019-10-06 DIAGNOSIS — C773 Secondary and unspecified malignant neoplasm of axilla and upper limb lymph nodes: Secondary | ICD-10-CM | POA: Insufficient documentation

## 2019-10-06 DIAGNOSIS — C50411 Malignant neoplasm of upper-outer quadrant of right female breast: Secondary | ICD-10-CM

## 2019-10-06 DIAGNOSIS — C7951 Secondary malignant neoplasm of bone: Secondary | ICD-10-CM | POA: Diagnosis not present

## 2019-10-06 DIAGNOSIS — Z5112 Encounter for antineoplastic immunotherapy: Secondary | ICD-10-CM | POA: Insufficient documentation

## 2019-10-06 DIAGNOSIS — Z95828 Presence of other vascular implants and grafts: Secondary | ICD-10-CM

## 2019-10-06 DIAGNOSIS — Z79811 Long term (current) use of aromatase inhibitors: Secondary | ICD-10-CM | POA: Diagnosis not present

## 2019-10-06 DIAGNOSIS — Z23 Encounter for immunization: Secondary | ICD-10-CM

## 2019-10-06 LAB — CBC WITH DIFFERENTIAL (CANCER CENTER ONLY)
Abs Immature Granulocytes: 0.02 10*3/uL (ref 0.00–0.07)
Basophils Absolute: 0 10*3/uL (ref 0.0–0.1)
Basophils Relative: 0 %
Eosinophils Absolute: 0.2 10*3/uL (ref 0.0–0.5)
Eosinophils Relative: 5 %
HCT: 35.6 % — ABNORMAL LOW (ref 36.0–46.0)
Hemoglobin: 11.5 g/dL — ABNORMAL LOW (ref 12.0–15.0)
Immature Granulocytes: 0 %
Lymphocytes Relative: 26 %
Lymphs Abs: 1.2 10*3/uL (ref 0.7–4.0)
MCH: 28.9 pg (ref 26.0–34.0)
MCHC: 32.3 g/dL (ref 30.0–36.0)
MCV: 89.4 fL (ref 80.0–100.0)
Monocytes Absolute: 0.5 10*3/uL (ref 0.1–1.0)
Monocytes Relative: 11 %
Neutro Abs: 2.6 10*3/uL (ref 1.7–7.7)
Neutrophils Relative %: 58 %
Platelet Count: 384 10*3/uL (ref 150–400)
RBC: 3.98 MIL/uL (ref 3.87–5.11)
RDW: 17.9 % — ABNORMAL HIGH (ref 11.5–15.5)
WBC Count: 4.6 10*3/uL (ref 4.0–10.5)
nRBC: 0 % (ref 0.0–0.2)

## 2019-10-06 LAB — CMP (CANCER CENTER ONLY)
ALT: 13 U/L (ref 0–44)
AST: 13 U/L — ABNORMAL LOW (ref 15–41)
Albumin: 3.4 g/dL — ABNORMAL LOW (ref 3.5–5.0)
Alkaline Phosphatase: 93 U/L (ref 38–126)
Anion gap: 10 (ref 5–15)
BUN: 19 mg/dL (ref 8–23)
CO2: 26 mmol/L (ref 22–32)
Calcium: 9.7 mg/dL (ref 8.9–10.3)
Chloride: 106 mmol/L (ref 98–111)
Creatinine: 1.14 mg/dL — ABNORMAL HIGH (ref 0.44–1.00)
GFR, Est AFR Am: 56 mL/min — ABNORMAL LOW (ref >60)
GFR, Estimated: 49 mL/min — ABNORMAL LOW (ref >60)
Glucose, Bld: 108 mg/dL — ABNORMAL HIGH (ref 70–99)
Potassium: 3.7 mmol/L (ref 3.5–5.1)
Sodium: 142 mmol/L (ref 135–145)
Total Bilirubin: <0.2 mg/dL — ABNORMAL LOW (ref 0.3–1.2)
Total Protein: 7 g/dL (ref 6.5–8.1)

## 2019-10-06 MED ORDER — ZOLEDRONIC ACID 4 MG/5ML IV CONC: 4.0000 mg | Freq: Once | INTRAVENOUS | Status: DC

## 2019-10-06 MED ORDER — ACETAMINOPHEN 325 MG PO TABS
650.0000 mg | ORAL_TABLET | Freq: Once | ORAL | Status: AC
  Administered 2019-10-06: 16:00:00 650 mg via ORAL

## 2019-10-06 MED ORDER — TRASTUZUMAB CHEMO 150 MG IV SOLR
450.0000 mg | Freq: Once | INTRAVENOUS | Status: AC
  Administered 2019-10-06: 16:00:00 450 mg via INTRAVENOUS
  Filled 2019-10-06: qty 21.43

## 2019-10-06 MED ORDER — INFLUENZA VAC A&B SA ADJ QUAD 0.5 ML IM PRSY
0.5000 mL | PREFILLED_SYRINGE | Freq: Once | INTRAMUSCULAR | Status: AC
  Administered 2019-10-06: 16:00:00 0.5 mL via INTRAMUSCULAR

## 2019-10-06 MED ORDER — SODIUM CHLORIDE 0.9% FLUSH
10.0000 mL | Freq: Once | INTRAVENOUS | Status: AC
  Administered 2019-10-06: 10 mL
  Filled 2019-10-06: qty 10

## 2019-10-06 MED ORDER — ACETAMINOPHEN 325 MG PO TABS
ORAL_TABLET | ORAL | Status: AC
  Filled 2019-10-06: qty 2

## 2019-10-06 MED ORDER — DIPHENHYDRAMINE HCL 25 MG PO CAPS
ORAL_CAPSULE | ORAL | Status: AC
  Filled 2019-10-06: qty 1

## 2019-10-06 MED ORDER — DIPHENHYDRAMINE HCL 25 MG PO CAPS
25.0000 mg | ORAL_CAPSULE | Freq: Once | ORAL | Status: AC
  Administered 2019-10-06: 25 mg via ORAL

## 2019-10-06 MED ORDER — INFLUENZA VAC A&B SA ADJ QUAD 0.5 ML IM PRSY
PREFILLED_SYRINGE | INTRAMUSCULAR | Status: AC
  Filled 2019-10-06: qty 0.5

## 2019-10-06 MED ORDER — HEPARIN SOD (PORK) LOCK FLUSH 100 UNIT/ML IV SOLN
500.0000 [IU] | Freq: Once | INTRAVENOUS | Status: AC | PRN
  Administered 2019-10-06: 17:00:00 500 [IU]
  Filled 2019-10-06: qty 5

## 2019-10-06 MED ORDER — SODIUM CHLORIDE 0.9 % IV SOLN
Freq: Once | INTRAVENOUS | Status: AC
  Administered 2019-10-06: 15:00:00 via INTRAVENOUS
  Filled 2019-10-06: qty 250

## 2019-10-06 MED ORDER — SODIUM CHLORIDE 0.9% FLUSH
10.0000 mL | INTRAVENOUS | Status: DC | PRN
  Administered 2019-10-06: 17:00:00 10 mL
  Filled 2019-10-06: qty 10

## 2019-10-06 NOTE — Patient Instructions (Signed)
Coronavirus (COVID-19) Are you at risk?  Are you at risk for the Coronavirus (COVID-19)?  To be considered HIGH RISK for Coronavirus (COVID-19), you have to meet the following criteria:  . Traveled to [place], [place], [place], [place] or [place]; or in the [place] to [place], [place], [place], or [place]; and have fever, cough, and shortness of breath within the last 2 weeks of travel OR . Been in close contact with a person diagnosed with COVID-19 within the last 2 weeks and have fever, cough, and shortness of breath . IF YOU DO NOT MEET THESE CRITERIA, YOU ARE CONSIDERED LOW RISK FOR COVID-19.  What to do if you are HIGH RISK for COVID-19?  . If you are having a medical emergency, call 911. . Seek medical care right away. Before you go to a doctor's office, urgent care or emergency department, call ahead and tell them about your recent travel, contact with someone diagnosed with COVID-19, and your symptoms. You should receive instructions from your physician's office regarding next steps of care.  . When you arrive at healthcare provider, tell the healthcare staff immediately you have returned from visiting [place], [place], [place], [place] or [place]; or traveled in the [place] to [place], [place], [place], or [place]; in the last two weeks or you have been in close contact with a person diagnosed with COVID-19 in the last 2 weeks.   . Tell the health care staff about your symptoms: fever, cough and shortness of breath. . After you have been seen by a medical provider, you will be either: o Tested for (COVID-19) and discharged home on quarantine except to seek medical care if symptoms worsen, and asked to  - Stay home and avoid contact with others until you get your results (4-5 days)  - Avoid travel on public transportation if possible (such as bus, train, or airplane) or o Sent to the Emergency Department by EMS for evaluation, COVID-19 testing, and possible  admission depending on your condition and test results.  What to do if you are LOW RISK for COVID-19?  Reduce your risk of any infection by using the same precautions used for avoiding the common cold or flu:  . Wash your hands often with soap and warm water for at least 20 seconds.  If soap and water are not readily available, use an alcohol-based hand sanitizer with at least 60% alcohol.  . If coughing or sneezing, cover your mouth and nose by coughing or sneezing into the elbow areas of your shirt or coat, into a tissue or into your sleeve (not your hands). . Avoid shaking hands with others and consider head nods or verbal greetings only. . Avoid touching your eyes, nose, or mouth with unwashed hands.  . Avoid close contact with people who are sick. . Avoid places or events with large numbers of people in one location, like concerts or sporting events. . Carefully consider travel plans you have or are making. . If you are planning any travel outside or inside the US, visit the CDC's Travelers' Health webpage for the latest health notices. . If you have some symptoms but not all symptoms, continue to monitor at home and seek medical attention if your symptoms worsen. . If you are having a medical emergency, call 911.   ADDITIONAL HEALTHCARE OPTIONS FOR PATIENTS  [facility] / e-Visit: [email or web address]         [facility]: [phone]  [facility]: [phone]                   [facility]: [phone]    [facility] Discharge Instructions for Patients Receiving Chemotherapy  Today you received the following chemotherapy agents Herceptin   To help prevent nausea and vomiting after your treatment, we encourage you to take your nausea medication as directed.    If you develop nausea and vomiting that is not controlled by your nausea medication, call the clinic.   BELOW  ARE SYMPTOMS THAT SHOULD BE REPORTED IMMEDIATELY:  *FEVER GREATER THAN 100.5 F  *CHILLS WITH OR WITHOUT FEVER  NAUSEA AND VOMITING THAT IS NOT CONTROLLED WITH YOUR NAUSEA MEDICATION  *UNUSUAL SHORTNESS OF BREATH  *UNUSUAL BRUISING OR BLEEDING  TENDERNESS IN MOUTH AND THROAT WITH OR WITHOUT PRESENCE OF ULCERS  *URINARY PROBLEMS  *BOWEL PROBLEMS  UNUSUAL RASH Items with * indicate a potential emergency and should be followed up as soon as possible.  Feel free to call the clinic should you have any questions or concerns. The clinic phone number is [phone].  Please show the CHEMO ALERT CARD at check-in to the Emergency Department and triage nurse.   

## 2019-10-06 NOTE — Progress Notes (Signed)
Tracie [name]  [phone] [phone]) [phone]     ID: Tracie [name] DOB: 26-Mar-1951  MR#: [id number]  KGU#:[id number]  Patient Care Team: Bartholome Bill, MD as PCP - General (Family Medicine) [name], MD as Consulting Physician (General Surgery) Magrinat, [name], MD as Consulting Physician (Oncology) [name], MD as Attending Physician (Radiation Oncology) Mottinger, Henderson DDS (Physical Therapy) [name], MD as Consulting Physician (Cardiology) [name], [name], NP as Nurse Practitioner (Hematology and Oncology) OTHER MD:    CHIEF COMPLAINT: Triple positive breast cancer  CURRENT TREATMENT: trastuzumab; anastrozole, zoledronate   INTERVAL HISTORY: Tracie [name] returns today for follow-up and treatment of her triple positive breast cancer.  She continues on anastrozole. She tolerates this well and without any noticeable side effects. She denies hot flashes, noting she is cold even in the hot weather.  She also continues on trastuzumab.  She tolerates this well.  She has repeat echocardiogram scheduled with Dr. Aundra Dubin in 10/2019.  REVIEW OF SYSTEMS: Tracie [name] is doing well today.  She had left hip pain at her last appointment and xray was negative.  She notes that her pain is improved.  She denies any new issues.  She does want me to write out her cancer biopsy results for her today.    Philis has no cough, shortness of breath, chest pain, or palpitations.  She is without fever, chills, nausea, vomiting, bowel/bladder changes.  A detailed ROS Was otherwise non contributory.    HISTORY OF CURRENT ILLNESS: From the original intake note:  Tracie [name] noted a change in her right breast sometime in July 2018. [facility] to report that she had found a "kernel" in her breast. She was advised to see her primary physician which she did. The patient was then set up for bilateral diagnostic mammography with  tomography and right breast ultrasonography at [facility] 09/03/2017. The breast density was category I a. In the upper outer quadrant of the right breast there was a 2.1 cm high density mass, which was palpable. Also in the right breast more posteriorly there was a 1.5 cm high density mass which was felt to be a suspicious lymph node. Ultrasound of the right breast confirmed a 2.1 centimeter lobulated solid mass in the right breast upper outer quadrant 15.8 cm from the nipple in the 10:00 radiant. There was a 1.5 cm oval mass in the right axillary tail with a small rounded masses also suspicious for lymph node involvement. A total of 3 suspicious lymph nodes were identified.  On 09/03/2017 the patient underwent biopsy of the breast mass and the suspicious right axillary lymph node. The final pathology ([id number]) found invasive ductal carcinoma, grade 3, in both. Both tumors were estrogen receptor positive at 70-75%, and both were progesterone receptor negative. Both had an elevated proliferation marker at 90%. The mass in the breast was HER-2 negative, with a signals ratio of 1.25 and the number per cell 1.88. The lymph node mass however was HER-2 positive with a signals ratio of 2.57, and the number per cell 3.60.  The patient's subsequent history is as detailed below.   PAST MEDICAL HISTORY: Past Medical History:  Diagnosis Date  . Angina pectoris ([place])    history of   . Arthritis   . Asthma   . Cancer ([place])    right breast  . Constipation   . Diverticulitis   . History of radiation therapy 04/07/18- 05/11/18   50 Gy in 25  fractions to right breast and regional nodes with four fields (no boost)  . History of radiation therapy 04/21/2018   T8 spine, 18 Gy in 1 fraction for a total dose of 18 Gy.   Marland Kitchen HPV in female   . Hyperlipidemia   . Hypertension   . Hypothyroidism   . Wears dentures   . Wears glasses     PAST SURGICAL HISTORY: Past Surgical History:  Procedure Laterality Date  .  BREAST LUMPECTOMY WITH RADIOACTIVE SEED AND SENTINEL LYMPH NODE BIOPSY Right 03/08/2018   Procedure: RIGHT BREAST RADIOACTIVE SEED GUIDED LUMPECTOMY WITH RIGHT AXILLARY RADIOACTIVE SEED TARGETED LYMPH NODE EXCISION AND SENTINEL LYMPH NODE BIOPSY, INJECT BLUE DYE RIGHT BREAST;  Surgeon: [name], MD;  Location: [facility];  Service: General;  Laterality: Right;  . CESAREAN SECTION    . COLON SURGERY  2012   sigmoidectomy  . COLOSTOMY    . COLOSTOMY TAKEDOWN  10/06/11  . IR BONE TUMOR(S)RF ABLATION  11/02/2017  . IR KYPHO THORACIC WITH BONE BIOPSY  11/02/2017  . IR RADIOLOGIST EVAL & MGMT  10/07/2017  . IR RADIOLOGIST EVAL & MGMT  11/25/2017  . PORTACATH PLACEMENT N/A 09/25/2017   Procedure: INSERTION PORT-A-CATH WITH ULTRA SOUND ERAS PATHWAY;  Surgeon: [name], MD;  Location: [facility];  Service: General;  Laterality: N/A;  . TONSILLECTOMY    . TUBAL LIGATION      FAMILY HISTORY: Family History  Problem Relation Age of Onset  . Cancer Mother        rectal  . Hypertension Brother   . Hypertension Brother   . Asthma Son   . Asthma Brother    The patient has little information regarding her father. Her mother died at age 83 from a strange cancer--the patient does not know what it was, it may well have been cervical cancer from her description. The patient has one brother, no sisters. There is no history of breast or ovarian cancer in the family to the patient's knowledge    GYNECOLOGIC HISTORY:  No LMP recorded. Patient is postmenopausal.  menarche age 68, first live birth age 49 the patient is [place] P3. She stopped having periods at age 13. She never used oral contraceptives or hormone replacement.    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  She is currently working out of her home.  She is hoping to retire October 2020 she is divorced. Currently her son [name] stays with her sometimes. He is a Art gallery manager and therefore not working during the  pandemic. The 2 other children are SHILAH HEFEL, who lives in [place] and works in the theater and media, and Texas Oborn lives in [place], and is vice president of a [employer]. The patient has 5 grandchildren. She is a Psychologist, forensic.   ADVANCED DIRECTIVES: Not in place    HEALTH MAINTENANCE: Social History   Tobacco Use  . Smoking status: Current Some Day Smoker    Packs/day: 0.25    Years: 40.00    Pack years: 10.00    Types: Cigarettes  . Smokeless tobacco: Never Used  . Tobacco comment: she is smoking 2 or more daily, she plans to use nicotine patch.   Substance Use Topics  . Alcohol use: Yes    Comment: occasional  . Drug use: No    Colonoscopy: September 2012   PAP:  Bone density:09/03/2017    Allergies  Allergen Reactions  . Fish Allergy Anaphylaxis and Swelling  Swelling of hands and feet., non shellfish allergy  . Other Anaphylaxis    Tree nuts   . Ace Inhibitors Other (See Comments) and Cough    Cold like symptoms   . Tylenol [Acetaminophen] Other (See Comments)    Headache     Current Outpatient Medications  Medication Sig Dispense Refill  . ADVAIR DISKUS 250-50 MCG/DOSE AEPB INL 1 PUFF ITL Q 12 H    . albuterol (PROAIR HFA) 108 (90 Base) MCG/ACT inhaler INHALE 2 PUFFS BY MOUTH INTO THE LUNGS EVERY 6 HOURS AS NEEDED FOR WHEEZING    . Alirocumab (PRALUENT) 75 MG/ML SOPN Inject 75 mg into the skin every 14 (fourteen) days. 2 pen 11  . amLODipine (NORVASC) 10 MG tablet Take 10 mg by mouth daily.     Marland Kitchen anastrozole (ARIMIDEX) 1 MG tablet TAKE 1 TABLET(1 MG) BY MOUTH DAILY 90 tablet 4  . aspirin EC 81 MG tablet Take 1 tablet (81 mg total) by mouth daily. 90 tablet 3  . cetirizine (ZYRTEC) 10 MG tablet Take 10 mg by mouth daily as needed for allergies.   11  . levothyroxine (SYNTHROID, LEVOTHROID) 50 MCG tablet Take 50 mcg by mouth daily.    Marland Kitchen lidocaine-prilocaine (EMLA) cream Apply 1 application topically as needed (port access). 30 g 0  .  losartan-hydrochlorothiazide (HYZAAR) 100-12.5 MG tablet Take 1 tablet by mouth daily.   3  . metoprolol succinate (TOPROL XL) 25 MG 24 hr tablet Take 3 tablets (75 mg total) by mouth daily. 270 tablet 3  . nicotine (NICODERM CQ - DOSED IN MG/24 HOURS) 14 mg/24hr patch Place 1 patch (14 mg total) onto the skin daily. 28 patch 0  . nitroGLYCERIN (NITROSTAT) 0.4 MG SL tablet Place 1 tablet (0.4 mg total) under the tongue every 5 (five) minutes as needed for chest pain. 75 tablet 3  . potassium chloride (K-DUR) 10 MEQ tablet TAKE 2 TABLETS(20 MEQ) BY MOUTH DAILY 60 tablet 0   No current facility-administered medications for this visit.     OBJECTIVE: Middle-aged African-American woman in no acute distress  Vitals:   10/06/19 1411 10/06/19 1418  BP: (!) 143/75 (!) 143/75  Pulse: 88 88  Resp: 18 18  Temp: 98.9 F (37.2 C) 98.9 F (37.2 C)  SpO2: 97% 97%     Body mass index is 30.63 kg/m.   Wt Readings from Last 3 Encounters:  10/06/19 172 lb 14.4 oz (78.4 kg)  09/07/19 175 lb 8 oz (79.6 kg)  07/06/19 176 lb 6.4 oz (80 kg)   ECOG FS:1  GENERAL: Patient is a well appearing female in no acute distress HEENT:  Sclerae anicteric.  Oropharynx clear and moist. No ulcerations or evidence of oropharyngeal candidiasis. Neck is supple.  NODES:  No cervical, supraclavicular, or axillary lymphadenopathy palpated.  BREAST EXAM:  Right breast s/p lumpectomy and radiation, left breast benign LUNGS:  Clear to auscultation bilaterally.  No wheezes or rhonchi. HEART:  Regular rate and rhythm. No murmur appreciated. ABDOMEN:  Soft, nontender.  Positive, normoactive bowel sounds. No organomegaly palpated. MSK:  No focal spinal tenderness to palpation. Full range of motion bilaterally in the upper extremities. EXTREMITIES:  No peripheral edema.   SKIN:  Clear with no obvious rashes or skin changes. No nail dyscrasia. NEURO:  Nonfocal. Well oriented.  Appropriate affect.   LAB RESULTS:  CMP      Component Value Date/Time   NA 142 10/06/2019 1351   NA 138 12/24/2017 0752   K  3.7 10/06/2019 1351   K 3.3 (L) 12/24/2017 0752   CL 106 10/06/2019 1351   CO2 26 10/06/2019 1351   CO2 25 12/24/2017 0752   GLUCOSE 108 (H) 10/06/2019 1351   GLUCOSE 99 12/24/2017 0752   BUN 19 10/06/2019 1351   BUN 7.9 12/24/2017 0752   CREATININE 1.14 (H) 10/06/2019 1351   CREATININE 0.7 12/24/2017 0752   CALCIUM 9.7 10/06/2019 1351   CALCIUM 9.2 12/24/2017 0752   PROT 7.0 10/06/2019 1351   PROT 6.3 (L) 12/24/2017 0752   ALBUMIN 3.4 (L) 10/06/2019 1351   ALBUMIN 3.4 (L) 12/24/2017 0752   AST 13 (L) 10/06/2019 1351   AST 15 12/24/2017 0752   ALT 13 10/06/2019 1351   ALT 18 12/24/2017 0752   ALKPHOS 93 10/06/2019 1351   ALKPHOS 93 12/24/2017 0752   BILITOT <0.2 (L) 10/06/2019 1351   BILITOT <0.22 12/24/2017 0752   GFRNONAA 49 (L) 10/06/2019 1351   GFRAA 56 (L) 10/06/2019 1351    No results found for: Ronnald Ramp, A1GS, A2GS, BETS, BETA2SER, GAMS, MSPIKE, SPEI  No results found for: KPAFRELGTCHN, LAMBDASER, [facility]  Lab Results  Component Value Date   WBC 4.6 10/06/2019   NEUTROABS 2.6 10/06/2019   HGB 11.5 (L) 10/06/2019   HCT 35.6 (L) 10/06/2019   MCV 89.4 10/06/2019   PLT 384 10/06/2019      Chemistry      Component Value Date/Time   NA 142 10/06/2019 1351   NA 138 12/24/2017 0752   K 3.7 10/06/2019 1351   K 3.3 (L) 12/24/2017 0752   CL 106 10/06/2019 1351   CO2 26 10/06/2019 1351   CO2 25 12/24/2017 0752   BUN 19 10/06/2019 1351   BUN 7.9 12/24/2017 0752   CREATININE 1.14 (H) 10/06/2019 1351   CREATININE 0.7 12/24/2017 0752      Component Value Date/Time   CALCIUM 9.7 10/06/2019 1351   CALCIUM 9.2 12/24/2017 0752   ALKPHOS 93 10/06/2019 1351   ALKPHOS 93 12/24/2017 0752   AST 13 (L) 10/06/2019 1351   AST 15 12/24/2017 0752   ALT 13 10/06/2019 1351   ALT 18 12/24/2017 0752   BILITOT <0.2 (L) 10/06/2019 1351   BILITOT <0.22 12/24/2017 0752        No results found for: LABCA2  No components found for: [id number]    Appointment on 10/06/2019  Component Date Value Ref Range Status  . WBC Count 10/06/2019 4.6  4.0 - 10.5 K/uL Final  . RBC 10/06/2019 3.98  3.87 - 5.11 MIL/uL Final  . Hemoglobin 10/06/2019 11.5* 12.0 - 15.0 g/dL Final  . HCT 10/06/2019 35.6* 36.0 - 46.0 % Final  . MCV 10/06/2019 89.4  80.0 - 100.0 fL Final  . MCH 10/06/2019 28.9  26.0 - 34.0 pg Final  . MCHC 10/06/2019 32.3  30.0 - 36.0 g/dL Final  . RDW 10/06/2019 17.9* 11.5 - 15.5 % Final  . Platelet Count 10/06/2019 384  150 - 400 K/uL Final  . nRBC 10/06/2019 0.0  0.0 - 0.2 % Final  . Neutrophils Relative % 10/06/2019 58  % Final  . Neutro Abs 10/06/2019 2.6  1.7 - 7.7 K/uL Final  . Lymphocytes Relative 10/06/2019 26  % Final  . Lymphs Abs 10/06/2019 1.2  0.7 - 4.0 K/uL Final  . Monocytes Relative 10/06/2019 11  % Final  . Monocytes Absolute 10/06/2019 0.5  0.1 - 1.0 K/uL Final  . Eosinophils Relative 10/06/2019 5  % Final  . Eosinophils  Absolute 10/06/2019 0.2  0.0 - 0.5 K/uL Final  . Basophils Relative 10/06/2019 0  % Final  . Basophils Absolute 10/06/2019 0.0  0.0 - 0.1 K/uL Final  . Immature Granulocytes 10/06/2019 0  % Final  . Abs Immature Granulocytes 10/06/2019 0.02  0.00 - 0.07 K/uL Final   Performed at [facility], [place] [address], [place], [place] [address]  . Sodium 10/06/2019 142  135 - 145 mmol/L Final  . Potassium 10/06/2019 3.7  3.5 - 5.1 mmol/L Final  . Chloride 10/06/2019 106  98 - 111 mmol/L Final  . CO2 10/06/2019 26  22 - 32 mmol/L Final  . Glucose, Bld 10/06/2019 108* 70 - 99 mg/dL Final  . BUN 10/06/2019 19  8 - 23 mg/dL Final  . Creatinine 10/06/2019 1.14* 0.44 - 1.00 mg/dL Final  . Calcium 10/06/2019 9.7  8.9 - 10.3 mg/dL Final  . Total Protein 10/06/2019 7.0  6.5 - 8.1 g/dL Final  . Albumin 10/06/2019 3.4* 3.5 - 5.0 g/dL Final  . AST 10/06/2019 13* 15 - 41 U/L Final  . ALT 10/06/2019 13  0 - 44 U/L  Final  . Alkaline Phosphatase 10/06/2019 93  38 - 126 U/L Final  . Total Bilirubin 10/06/2019 <0.2* 0.3 - 1.2 mg/dL Final  . GFR, Est Non Af Am 10/06/2019 49* >60 mL/min Final  . GFR, Est AFR Am 10/06/2019 56* >60 mL/min Final  . Anion gap 10/06/2019 10  5 - 15 Final   Performed at [facility], [address] [place], [place] [address]    (this displays the last labs from the last 3 days)  No results found for: TOTALPROTELP, ALBUMINELP, A1GS, A2GS, BETS, BETA2SER, GAMS, MSPIKE, SPEI (this displays SPEP labs)  No results found for: KPAFRELGTCHN, LAMBDASER, KAPLAMBRATIO (kappa/lambda light chains)  No results found for: HGBA, HGBA2QUANT, HGBFQUANT, HGBSQUAN (Hemoglobinopathy evaluation)   No results found for: LDH  No results found for: IRON, TIBC, IRONPCTSAT (Iron and TIBC)  No results found for: FERRITIN  Urinalysis    Component Value Date/Time   LABSPEC 1.020 02/10/2011 2058   PHURINE 6.0 02/10/2011 2058   HGBUR LARGE (A) 02/10/2011 2058   BILIRUBINUR MODERATE (A) 02/10/2011 2058   KETONESUR 15 (A) 02/10/2011 2058   PROTEINUR >=300 (A) 02/10/2011 2058   UROBILINOGEN >=8.0 02/10/2011 2058   NITRITE NEGATIVE 02/10/2011 2058   LEUKOCYTESUR  02/10/2011 2058    NEGATIVE Biochemical Testing Only. Please order routine urinalysis from main lab if confirmatory testing is needed.     STUDIES: No results found.  ELIGIBLE FOR AVAILABLE RESEARCH PROTOCOL: no   ASSESSMENT: 68 y.o. [place] woman status post right breast upper outer quadrant and right axillary lymph node biopsy 09/03/2017, both positive for invasive ductal carcinoma, grade 3, estrogen receptor positive, progesterone receptor negative, with an MIB-1 of 90%, the lymph node being HER-2 positive, the breast mass HER-2 negative  (a) staging studies September 24, 2017 show a lytic lesion in T8, possible areas of concern at L1-L2  (b) biopsy/kyphoplasty/osteocool of T8 lesion 11/02/2017  confirms metastatic adenocarcinoma, 20% estrogen receptor positive, estrogen receptor negative  (c) PET scan 11/30/2017 shows no visceral disease, minimal metabolic activity at T8, no other bone lesions  (1) neoadjuvant chemotherapy with carboplatin, docetaxel, trastuzumab, and pertuzumab started 10/01/2017, completed 6 cycles 01/26/2018  (a) Docetaxel dropped after three cycles and Gemcitabine/Carbo started on 12/15/2017.   (2) trastuzumab to continue indefinitely  (a) echocardiogram 11/38/2018 shows an ejection fraction in the  55-60% range  (b) echocardiogram 06/17/2019 shows an ejection fraction in the 50-55% range  (3) right lumpectomy and sentinel lymph node sampling 03/08/2018  Showed a pT1(mic) pN0 invasive ductal carcinoma, grade 2, with negative margins.  (4) adjuvant radiation  04-07-18 to 05-11-18                                              (a) SITE/DOSE:  Right Breast and Axilla, SCV nodes / 50 Gy in 25 fx               (b) radiation to her oligo metastatic disease at T8 (18 Gy) in 1 fraction, 04/21/2018  (5) anastrozole started 05/29/2018  (a) bone density test pending  (6) zolendronate started 01/19/2019, repeated every 12 weeks   PLAN:  Tracie [name] is doing well today.  She has no clinical signs of progression.  She is due for restaging, and for repeat tumor marker labs.  I have placed orders for both of these.  Should her tumor markers continue to increase, I will have her go ahead and get scans, if not, she will get them in December and we will see her afterwards.    Tracie [name] doesn't want to receive the Zolendronate today, so I moved it to three weeks from now.  I spent some time with Tracie [name] today reviewing her tumor markers and answering her questions about those labs.    Tracie [name] will continue on Trastuzumab every three weeks and will continue taking the anastrozole daily.  She is tolerating both of these well.  We will see her back after her restaging scans.  She was  recommended to continue with the appropriate pandemic precautions. She knows to call for any questions that may arise between now and her next appointment.  We are happy to see her sooner if needed.  A total of (30) minutes of face-to-face time was spent with this patient with greater than 50% of that time in counseling and care-coordination.    Wilber Bihari, NP  10/06/19 2:58 PM Medical Oncology and Hematology [facility] [address] [place], [place] [address] Tel. [phone]    Fax. [phone]

## 2019-10-06 NOTE — Progress Notes (Signed)
Patient wants to wait until her next Herceptin infusion (10/26/19) for her Zometa infusion

## 2019-10-15 DIAGNOSIS — C50411 Malignant neoplasm of upper-outer quadrant of right female breast: Secondary | ICD-10-CM | POA: Diagnosis present

## 2019-10-15 DIAGNOSIS — Z23 Encounter for immunization: Secondary | ICD-10-CM | POA: Diagnosis not present

## 2019-10-15 DIAGNOSIS — Z170 Estrogen receptor positive status [ER+]: Secondary | ICD-10-CM | POA: Diagnosis not present

## 2019-10-15 DIAGNOSIS — Z79899 Other long term (current) drug therapy: Secondary | ICD-10-CM | POA: Diagnosis not present

## 2019-10-15 DIAGNOSIS — Z79811 Long term (current) use of aromatase inhibitors: Secondary | ICD-10-CM | POA: Diagnosis not present

## 2019-10-15 DIAGNOSIS — Z5112 Encounter for antineoplastic immunotherapy: Secondary | ICD-10-CM | POA: Diagnosis present

## 2019-10-15 DIAGNOSIS — C773 Secondary and unspecified malignant neoplasm of axilla and upper limb lymph nodes: Secondary | ICD-10-CM | POA: Diagnosis not present

## 2019-10-19 ENCOUNTER — Other Ambulatory Visit: Payer: Managed Care, Other (non HMO)

## 2019-10-19 ENCOUNTER — Ambulatory Visit: Payer: Managed Care, Other (non HMO)

## 2019-10-26 ENCOUNTER — Inpatient Hospital Stay: Payer: Medicare Other

## 2019-10-26 ENCOUNTER — Other Ambulatory Visit: Payer: Self-pay

## 2019-10-26 VITALS — BP 161/87 | HR 88 | Temp 98.9°F | Resp 17 | Wt 177.0 lb

## 2019-10-26 DIAGNOSIS — Z5112 Encounter for antineoplastic immunotherapy: Secondary | ICD-10-CM | POA: Diagnosis not present

## 2019-10-26 DIAGNOSIS — C50411 Malignant neoplasm of upper-outer quadrant of right female breast: Secondary | ICD-10-CM

## 2019-10-26 DIAGNOSIS — Z170 Estrogen receptor positive status [ER+]: Secondary | ICD-10-CM

## 2019-10-26 DIAGNOSIS — Z95828 Presence of other vascular implants and grafts: Secondary | ICD-10-CM

## 2019-10-26 DIAGNOSIS — C7951 Secondary malignant neoplasm of bone: Secondary | ICD-10-CM

## 2019-10-26 LAB — CBC WITH DIFFERENTIAL (CANCER CENTER ONLY)
Abs Immature Granulocytes: 0.01 10*3/uL (ref 0.00–0.07)
Basophils Absolute: 0 10*3/uL (ref 0.0–0.1)
Basophils Relative: 1 %
Eosinophils Absolute: 0.2 10*3/uL (ref 0.0–0.5)
Eosinophils Relative: 5 %
HCT: 36.5 % (ref 36.0–46.0)
Hemoglobin: 11.7 g/dL — ABNORMAL LOW (ref 12.0–15.0)
Immature Granulocytes: 0 %
Lymphocytes Relative: 22 %
Lymphs Abs: 1 10*3/uL (ref 0.7–4.0)
MCH: 28.5 pg (ref 26.0–34.0)
MCHC: 32.1 g/dL (ref 30.0–36.0)
MCV: 88.8 fL (ref 80.0–100.0)
Monocytes Absolute: 0.5 10*3/uL (ref 0.1–1.0)
Monocytes Relative: 11 %
Neutro Abs: 2.7 10*3/uL (ref 1.7–7.7)
Neutrophils Relative %: 61 %
Platelet Count: 356 10*3/uL (ref 150–400)
RBC: 4.11 MIL/uL (ref 3.87–5.11)
RDW: 17.5 % — ABNORMAL HIGH (ref 11.5–15.5)
WBC Count: 4.5 10*3/uL (ref 4.0–10.5)
nRBC: 0 % (ref 0.0–0.2)

## 2019-10-26 LAB — CMP (CANCER CENTER ONLY)
ALT: 10 U/L (ref 0–44)
AST: 10 U/L — ABNORMAL LOW (ref 15–41)
Albumin: 3.2 g/dL — ABNORMAL LOW (ref 3.5–5.0)
Alkaline Phosphatase: 102 U/L (ref 38–126)
Anion gap: 12 (ref 5–15)
BUN: 13 mg/dL (ref 8–23)
CO2: 26 mmol/L (ref 22–32)
Calcium: 9.4 mg/dL (ref 8.9–10.3)
Chloride: 106 mmol/L (ref 98–111)
Creatinine: 1.03 mg/dL — ABNORMAL HIGH (ref 0.44–1.00)
GFR, Est AFR Am: >60 mL/min (ref >60)
GFR, Estimated: 55 mL/min — ABNORMAL LOW (ref >60)
Glucose, Bld: 115 mg/dL — ABNORMAL HIGH (ref 70–99)
Potassium: 3.5 mmol/L (ref 3.5–5.1)
Sodium: 144 mmol/L (ref 135–145)
Total Bilirubin: <0.2 mg/dL — ABNORMAL LOW (ref 0.3–1.2)
Total Protein: 7 g/dL (ref 6.5–8.1)

## 2019-10-26 MED ORDER — TRASTUZUMAB CHEMO 150 MG IV SOLR
450.0000 mg | Freq: Once | INTRAVENOUS | Status: AC
  Administered 2019-10-26: 450 mg via INTRAVENOUS
  Filled 2019-10-26: qty 21.43

## 2019-10-26 MED ORDER — DIPHENHYDRAMINE HCL 25 MG PO CAPS
ORAL_CAPSULE | ORAL | Status: AC
  Filled 2019-10-26: qty 1

## 2019-10-26 MED ORDER — SODIUM CHLORIDE 0.9% FLUSH
10.0000 mL | INTRAVENOUS | Status: DC | PRN
  Administered 2019-10-26: 10 mL
  Filled 2019-10-26: qty 10

## 2019-10-26 MED ORDER — HEPARIN SOD (PORK) LOCK FLUSH 100 UNIT/ML IV SOLN
500.0000 [IU] | Freq: Once | INTRAVENOUS | Status: AC | PRN
  Administered 2019-10-26: 500 [IU]
  Filled 2019-10-26: qty 5

## 2019-10-26 MED ORDER — ACETAMINOPHEN 325 MG PO TABS
650.0000 mg | ORAL_TABLET | Freq: Once | ORAL | Status: AC
  Administered 2019-10-26: 16:00:00 650 mg via ORAL

## 2019-10-26 MED ORDER — SODIUM CHLORIDE 0.9% FLUSH
10.0000 mL | Freq: Once | INTRAVENOUS | Status: AC
  Administered 2019-10-26: 14:00:00 10 mL
  Filled 2019-10-26: qty 10

## 2019-10-26 MED ORDER — ACETAMINOPHEN 325 MG PO TABS
ORAL_TABLET | ORAL | Status: AC
  Filled 2019-10-26: qty 2

## 2019-10-26 MED ORDER — DIPHENHYDRAMINE HCL 25 MG PO CAPS
25.0000 mg | ORAL_CAPSULE | Freq: Once | ORAL | Status: AC
  Administered 2019-10-26: 25 mg via ORAL

## 2019-10-26 MED ORDER — ZOLEDRONIC ACID 4 MG/100ML IV SOLN
4.0000 mg | Freq: Once | INTRAVENOUS | Status: AC
  Administered 2019-10-26: 4 mg via INTRAVENOUS
  Filled 2019-10-26: qty 100

## 2019-10-26 MED ORDER — SODIUM CHLORIDE 0.9 % IV SOLN
Freq: Once | INTRAVENOUS | Status: AC
  Administered 2019-10-26: 15:00:00 via INTRAVENOUS
  Filled 2019-10-26: qty 250

## 2019-10-26 NOTE — Patient Instructions (Signed)
Trastuzumab injection for infusion What is this medicine? TRASTUZUMAB (tras TOO zoo mab) is a monoclonal antibody. It is used to treat breast cancer and stomach cancer. This medicine may be used for other purposes; ask your health care provider or pharmacist if you have questions. COMMON BRAND NAME(S): Herceptin, Herzuma, KANJINTI, Ogivri, Ontruzant, Trazimera What should I tell my health care provider before I take this medicine? They need to know if you have any of these conditions:  heart disease  heart failure  lung or breathing disease, like asthma  an unusual or allergic reaction to trastuzumab, benzyl alcohol, or other medications, foods, dyes, or preservatives  pregnant or trying to get pregnant  breast-feeding How should I use this medicine? This drug is given as an infusion into a vein. It is administered in a hospital or clinic by a specially trained health care professional. Talk to your pediatrician regarding the use of this medicine in children. This medicine is not approved for use in children. Overdosage: If you think you have taken too much of this medicine contact a poison control center or emergency room at once. NOTE: This medicine is only for you. Do not share this medicine with others. What if I miss a dose? It is important not to miss a dose. Call your doctor or health care professional if you are unable to keep an appointment. What may interact with this medicine? This medicine may interact with the following medications:  certain types of chemotherapy, such as daunorubicin, doxorubicin, epirubicin, and idarubicin This list may not describe all possible interactions. Give your health care provider a list of all the medicines, herbs, non-prescription drugs, or dietary supplements you use. Also tell them if you smoke, drink alcohol, or use illegal drugs. Some items may interact with your medicine. What should I watch for while using this medicine? Visit your  doctor for checks on your progress. Report any side effects. Continue your course of treatment even though you feel ill unless your doctor tells you to stop. Call your doctor or health care professional for advice if you get a fever, chills or sore throat, or other symptoms of a cold or flu. Do not treat yourself. Try to avoid being around people who are sick. You may experience fever, chills and shaking during your first infusion. These effects are usually mild and can be treated with other medicines. Report any side effects during the infusion to your health care professional. Fever and chills usually do not happen with later infusions. Do not become pregnant while taking this medicine or for 7 months after stopping it. Women should inform their doctor if they wish to become pregnant or think they might be pregnant. Women of child-bearing potential will need to have a negative pregnancy test before starting this medicine. There is a potential for serious side effects to an unborn child. Talk to your health care professional or pharmacist for more information. Do not breast-feed an infant while taking this medicine or for 7 months after stopping it. Women must use effective birth control with this medicine. What side effects may I notice from receiving this medicine? Side effects that you should report to your doctor or health care professional as soon as possible:  allergic reactions like skin rash, itching or hives, swelling of the face, lips, or tongue  chest pain or palpitations  cough  dizziness  feeling faint or lightheaded, falls  fever  general ill feeling or flu-like symptoms  signs of worsening heart failure like   breathing problems; swelling in your legs and feet  unusually weak or tired Side effects that usually do not require medical attention (report to your doctor or health care professional if they continue or are bothersome):  bone pain  changes in  taste  diarrhea  joint pain  nausea/vomiting  weight loss This list may not describe all possible side effects. Call your doctor for medical advice about side effects. You may report side effects to FDA at 1-800-FDA-1088. Where should I keep my medicine? This drug is given in a hospital or clinic and will not be stored at home. NOTE: This sheet is a summary. It may not cover all possible information. If you have questions about this medicine, talk to your doctor, pharmacist, or health care provider.  2020 Elsevier/Gold Standard (2016-12-09 14:37:52)  Zoledronic Acid injection (Hypercalcemia, Oncology) What is this medicine? ZOLEDRONIC ACID (ZOE le dron ik AS id) lowers the amount of calcium loss from bone. It is used to treat too much calcium in your blood from cancer. It is also used to prevent complications of cancer that has spread to the bone. This medicine may be used for other purposes; ask your health care provider or pharmacist if you have questions. COMMON BRAND NAME(S): Zometa What should I tell my health care provider before I take this medicine? They need to know if you have any of these conditions:  aspirin-sensitive asthma  cancer, especially if you are receiving medicines used to treat cancer  dental disease or wear dentures  infection  kidney disease  receiving corticosteroids like dexamethasone or prednisone  an unusual or allergic reaction to zoledronic acid, other medicines, foods, dyes, or preservatives  pregnant or trying to get pregnant  breast-feeding How should I use this medicine? This medicine is for infusion into a vein. It is given by a health care professional in a hospital or clinic setting. Talk to your pediatrician regarding the use of this medicine in children. Special care may be needed. Overdosage: If you think you have taken too much of this medicine contact a poison control center or emergency room at once. NOTE: This medicine is only  for you. Do not share this medicine with others. What if I miss a dose? It is important not to miss your dose. Call your doctor or health care professional if you are unable to keep an appointment. What may interact with this medicine?  certain antibiotics given by injection  NSAIDs, medicines for pain and inflammation, like ibuprofen or naproxen  some diuretics like bumetanide, furosemide  teriparatide  thalidomide This list may not describe all possible interactions. Give your health care provider a list of all the medicines, herbs, non-prescription drugs, or dietary supplements you use. Also tell them if you smoke, drink alcohol, or use illegal drugs. Some items may interact with your medicine. What should I watch for while using this medicine? Visit your doctor or health care professional for regular checkups. It may be some time before you see the benefit from this medicine. Do not stop taking your medicine unless your doctor tells you to. Your doctor may order blood tests or other tests to see how you are doing. Women should inform their doctor if they wish to become pregnant or think they might be pregnant. There is a potential for serious side effects to an unborn child. Talk to your health care professional or pharmacist for more information. You should make sure that you get enough calcium and vitamin D while you are   taking this medicine. Discuss the foods you eat and the vitamins you take with your health care professional. Some people who take this medicine have severe bone, joint, and/or muscle pain. This medicine may also increase your risk for jaw problems or a broken thigh bone. Tell your doctor right away if you have severe pain in your jaw, bones, joints, or muscles. Tell your doctor if you have any pain that does not go away or that gets worse. Tell your dentist and dental surgeon that you are taking this medicine. You should not have major dental surgery while on this  medicine. See your dentist to have a dental exam and fix any dental problems before starting this medicine. Take good care of your teeth while on this medicine. Make sure you see your dentist for regular follow-up appointments. What side effects may I notice from receiving this medicine? Side effects that you should report to your doctor or health care professional as soon as possible:  allergic reactions like skin rash, itching or hives, swelling of the face, lips, or tongue  anxiety, confusion, or depression  breathing problems  changes in vision  eye pain  feeling faint or lightheaded, falls  jaw pain, especially after dental work  mouth sores  muscle cramps, stiffness, or weakness  redness, blistering, peeling or loosening of the skin, including inside the mouth  trouble passing urine or change in the amount of urine Side effects that usually do not require medical attention (report to your doctor or health care professional if they continue or are bothersome):  bone, joint, or muscle pain  constipation  diarrhea  fever  hair loss  irritation at site where injected  loss of appetite  nausea, vomiting  stomach upset  trouble sleeping  trouble swallowing  weak or tired This list may not describe all possible side effects. Call your doctor for medical advice about side effects. You may report side effects to FDA at 1-800-FDA-1088. Where should I keep my medicine? This drug is given in a hospital or clinic and will not be stored at home. NOTE: This sheet is a summary. It may not cover all possible information. If you have questions about this medicine, talk to your doctor, pharmacist, or health care provider.  2020 Elsevier/Gold Standard (2014-05-13 14:19:39)   

## 2019-10-27 LAB — CANCER ANTIGEN 27.29: CA 27.29: 95.5 U/mL — ABNORMAL HIGH (ref 0.0–38.6)

## 2019-11-02 ENCOUNTER — Other Ambulatory Visit: Payer: Self-pay | Admitting: Oncology

## 2019-11-02 ENCOUNTER — Telehealth: Payer: Self-pay | Admitting: *Deleted

## 2019-11-02 DIAGNOSIS — C50411 Malignant neoplasm of upper-outer quadrant of right female breast: Secondary | ICD-10-CM

## 2019-11-02 NOTE — Telephone Encounter (Signed)
Pt called to find out meaning of CA 27.29 results

## 2019-11-02 NOTE — Telephone Encounter (Signed)
Explained that CA 27.29 is a trigger that prompts Dr Jana Hakim to repeat scans for follow up. Verbalized understanding

## 2019-11-09 ENCOUNTER — Ambulatory Visit: Payer: Managed Care, Other (non HMO)

## 2019-11-09 ENCOUNTER — Other Ambulatory Visit: Payer: Managed Care, Other (non HMO)

## 2019-11-16 ENCOUNTER — Inpatient Hospital Stay: Payer: Medicare Other | Admitting: Adult Health

## 2019-11-16 ENCOUNTER — Inpatient Hospital Stay: Payer: Medicare Other

## 2019-11-16 ENCOUNTER — Other Ambulatory Visit: Payer: Self-pay

## 2019-11-16 ENCOUNTER — Inpatient Hospital Stay: Payer: Medicare Other | Attending: Adult Health

## 2019-11-16 VITALS — BP 170/92 | HR 86 | Temp 98.3°F | Resp 18 | Ht 63.0 in | Wt 172.6 lb

## 2019-11-16 DIAGNOSIS — C50411 Malignant neoplasm of upper-outer quadrant of right female breast: Secondary | ICD-10-CM

## 2019-11-16 DIAGNOSIS — Z79899 Other long term (current) drug therapy: Secondary | ICD-10-CM | POA: Insufficient documentation

## 2019-11-16 DIAGNOSIS — Z170 Estrogen receptor positive status [ER+]: Secondary | ICD-10-CM

## 2019-11-16 DIAGNOSIS — Z5112 Encounter for antineoplastic immunotherapy: Secondary | ICD-10-CM | POA: Insufficient documentation

## 2019-11-16 DIAGNOSIS — Z95828 Presence of other vascular implants and grafts: Secondary | ICD-10-CM

## 2019-11-16 DIAGNOSIS — C7951 Secondary malignant neoplasm of bone: Secondary | ICD-10-CM

## 2019-11-16 DIAGNOSIS — C773 Secondary and unspecified malignant neoplasm of axilla and upper limb lymph nodes: Secondary | ICD-10-CM | POA: Insufficient documentation

## 2019-11-16 LAB — CMP (CANCER CENTER ONLY)
ALT: 12 U/L (ref 0–44)
AST: 11 U/L — ABNORMAL LOW (ref 15–41)
Albumin: 3.3 g/dL — ABNORMAL LOW (ref 3.5–5.0)
Alkaline Phosphatase: 124 U/L (ref 38–126)
Anion gap: 12 (ref 5–15)
BUN: 11 mg/dL (ref 8–23)
CO2: 26 mmol/L (ref 22–32)
Calcium: 9.5 mg/dL (ref 8.9–10.3)
Chloride: 104 mmol/L (ref 98–111)
Creatinine: 0.84 mg/dL (ref 0.44–1.00)
GFR, Est AFR Am: >60 mL/min (ref >60)
GFR, Estimated: >60 mL/min (ref >60)
Glucose, Bld: 91 mg/dL (ref 70–99)
Potassium: 3.5 mmol/L (ref 3.5–5.1)
Sodium: 142 mmol/L (ref 135–145)
Total Bilirubin: <0.2 mg/dL — ABNORMAL LOW (ref 0.3–1.2)
Total Protein: 7.1 g/dL (ref 6.5–8.1)

## 2019-11-16 LAB — CBC WITH DIFFERENTIAL (CANCER CENTER ONLY)
Abs Immature Granulocytes: 0.03 10*3/uL (ref 0.00–0.07)
Basophils Absolute: 0 10*3/uL (ref 0.0–0.1)
Basophils Relative: 0 %
Eosinophils Absolute: 0.2 10*3/uL (ref 0.0–0.5)
Eosinophils Relative: 5 %
HCT: 35.2 % — ABNORMAL LOW (ref 36.0–46.0)
Hemoglobin: 11.1 g/dL — ABNORMAL LOW (ref 12.0–15.0)
Immature Granulocytes: 1 %
Lymphocytes Relative: 25 %
Lymphs Abs: 1.2 10*3/uL (ref 0.7–4.0)
MCH: 27.8 pg (ref 26.0–34.0)
MCHC: 31.5 g/dL (ref 30.0–36.0)
MCV: 88 fL (ref 80.0–100.0)
Monocytes Absolute: 0.7 10*3/uL (ref 0.1–1.0)
Monocytes Relative: 14 %
Neutro Abs: 2.8 10*3/uL (ref 1.7–7.7)
Neutrophils Relative %: 55 %
Platelet Count: 409 10*3/uL — ABNORMAL HIGH (ref 150–400)
RBC: 4 MIL/uL (ref 3.87–5.11)
RDW: 17.5 % — ABNORMAL HIGH (ref 11.5–15.5)
WBC Count: 5 10*3/uL (ref 4.0–10.5)
nRBC: 0 % (ref 0.0–0.2)

## 2019-11-16 MED ORDER — DIPHENHYDRAMINE HCL 25 MG PO CAPS
25.0000 mg | ORAL_CAPSULE | Freq: Once | ORAL | Status: AC
  Administered 2019-11-16: 25 mg via ORAL

## 2019-11-16 MED ORDER — DIPHENHYDRAMINE HCL 25 MG PO CAPS
ORAL_CAPSULE | ORAL | Status: AC
  Filled 2019-11-16: qty 1

## 2019-11-16 MED ORDER — TRASTUZUMAB CHEMO 150 MG IV SOLR
450.0000 mg | Freq: Once | INTRAVENOUS | Status: AC
  Administered 2019-11-16: 450 mg via INTRAVENOUS
  Filled 2019-11-16: qty 21.43

## 2019-11-16 MED ORDER — SODIUM CHLORIDE 0.9% FLUSH
10.0000 mL | Freq: Once | INTRAVENOUS | Status: AC
  Administered 2019-11-16: 13:00:00 10 mL
  Filled 2019-11-16: qty 10

## 2019-11-16 MED ORDER — SODIUM CHLORIDE 0.9 % IV SOLN
Freq: Once | INTRAVENOUS | Status: AC
  Administered 2019-11-16: 14:00:00 via INTRAVENOUS
  Filled 2019-11-16: qty 250

## 2019-11-16 MED ORDER — ACETAMINOPHEN 325 MG PO TABS
650.0000 mg | ORAL_TABLET | Freq: Once | ORAL | Status: AC
  Administered 2019-11-16: 650 mg via ORAL

## 2019-11-16 MED ORDER — SODIUM CHLORIDE 0.9% FLUSH
10.0000 mL | INTRAVENOUS | Status: DC | PRN
  Administered 2019-11-16: 10 mL
  Filled 2019-11-16: qty 10

## 2019-11-16 MED ORDER — HEPARIN SOD (PORK) LOCK FLUSH 100 UNIT/ML IV SOLN
500.0000 [IU] | Freq: Once | INTRAVENOUS | Status: AC | PRN
  Administered 2019-11-16: 500 [IU]
  Filled 2019-11-16: qty 5

## 2019-11-16 MED ORDER — ACETAMINOPHEN 325 MG PO TABS
ORAL_TABLET | ORAL | Status: AC
  Filled 2019-11-16: qty 2

## 2019-11-16 NOTE — Patient Instructions (Signed)
[facility] Discharge Instructions for Patients Receiving Chemotherapy  Today you received the following chemotherapy agents Trastuzumab (HERCEPTIN).  To help prevent nausea and vomiting after your treatment, we encourage you to take your nausea medication as prescribed.  If you develop nausea and vomiting that is not controlled by your nausea medication, call the clinic.   BELOW ARE SYMPTOMS THAT SHOULD BE REPORTED IMMEDIATELY:  *FEVER GREATER THAN 100.5 F  *CHILLS WITH OR WITHOUT FEVER  NAUSEA AND VOMITING THAT IS NOT CONTROLLED WITH YOUR NAUSEA MEDICATION  *UNUSUAL SHORTNESS OF BREATH  *UNUSUAL BRUISING OR BLEEDING  TENDERNESS IN MOUTH AND THROAT WITH OR WITHOUT PRESENCE OF ULCERS  *URINARY PROBLEMS  *BOWEL PROBLEMS  UNUSUAL RASH Items with * indicate a potential emergency and should be followed up as soon as possible.  Feel free to call the clinic should you have any questions or concerns. The clinic phone number is [phone].  Please show the CHEMO ALERT CARD at check-in to the Emergency Department and triage nurse.  Coronavirus (COVID-19) Are you at risk?  Are you at risk for the Coronavirus (COVID-19)?  To be considered HIGH RISK for Coronavirus (COVID-19), you have to meet the following criteria:  . Traveled to [place], [place], [place], [place] or [place]; or in the [place] to [place], [place], [place], or [place]; and have fever, cough, and shortness of breath within the last 2 weeks of travel OR . Been in close contact with a person diagnosed with COVID-19 within the last 2 weeks and have fever, cough, and shortness of breath . IF YOU DO NOT MEET THESE CRITERIA, YOU ARE CONSIDERED LOW RISK FOR COVID-19.  What to do if you are HIGH RISK for COVID-19?  . If you are having a medical emergency, call 911. . Seek medical care right away. Before you go to a doctor's office, urgent care or emergency department, call ahead and tell them  about your recent travel, contact with someone diagnosed with COVID-19, and your symptoms. You should receive instructions from your physician's office regarding next steps of care.  . When you arrive at healthcare provider, tell the healthcare staff immediately you have returned from visiting [place], [place], [place], [place] or [place]; or traveled in the [place] to [place], [place], [place], or [place]; in the last two weeks or you have been in close contact with a person diagnosed with COVID-19 in the last 2 weeks.   . Tell the health care staff about your symptoms: fever, cough and shortness of breath. . After you have been seen by a medical provider, you will be either: o Tested for (COVID-19) and discharged home on quarantine except to seek medical care if symptoms worsen, and asked to  - Stay home and avoid contact with others until you get your results (4-5 days)  - Avoid travel on public transportation if possible (such as bus, train, or airplane) or o Sent to the Emergency Department by EMS for evaluation, COVID-19 testing, and possible admission depending on your condition and test results.  What to do if you are LOW RISK for COVID-19?  Reduce your risk of any infection by using the same precautions used for avoiding the common cold or flu:  . Wash your hands often with soap and warm water for at least 20 seconds.  If soap and water are not readily available, use an alcohol-based hand sanitizer with at least 60% alcohol.  . If coughing or sneezing,   cover your mouth and nose by coughing or sneezing into the elbow areas of your shirt or coat, into a tissue or into your sleeve (not your hands). . Avoid shaking hands with others and consider head nods or verbal greetings only. . Avoid touching your eyes, nose, or mouth with unwashed hands.  . Avoid close contact with people who are sick. . Avoid places or events with large numbers of people in one location, like concerts or  sporting events. . Carefully consider travel plans you have or are making. . If you are planning any travel outside or inside the [place], visit the CDC's Travelers' Health webpage for the latest health notices. . If you have some symptoms but not all symptoms, continue to monitor at home and seek medical attention if your symptoms worsen. . If you are having a medical emergency, call 911.   ADDITIONAL HEALTHCARE OPTIONS FOR PATIENTS  [facility] / e-Visit: [email or web address]         [facility]: [phone]  [facility]: [phone]                   [facility]: [phone]   

## 2019-11-16 NOTE — Progress Notes (Signed)
[place]  [phone] [phone]     ID: Tracie [name] [name] DOB: [birth year]  MR#: [id number]  Tracie [name]#:[id number]  Patient Care Team: Bartholome Bill, MD as PCP - General (Family Medicine) [name], MD as Consulting Physician (General Surgery) Magrinat, [name], MD as Consulting Physician (Oncology) [name], MD as Attending Physician (Radiation Oncology) Mottinger, [place] DDS (Physical Therapy) [name], MD as Consulting Physician (Cardiology) [name], [name], NP as Nurse Practitioner (Hematology and Oncology) OTHER MD:    CHIEF COMPLAINT: Triple positive breast cancer  CURRENT TREATMENT: trastuzumab; anastrozole, zoledronate   INTERVAL HISTORY: Tracie [name] [name] returns today for follow-up and treatment of her triple positive breast cancer.  She continues on anastrozole. She tolerates this well and without any noticeable side effects. She denies hot flashes, noting she is cold even in the hot weather.  She also continues on trastuzumab.  She tolerates this well.  She has repeat echocardiogram scheduled with Dr. Aundra Dubin in 10/2019.  REVIEW OF SYSTEMS: Tracie [name] [name] is doing well today.  She has had her left hip pain return.  She notes that she has felt it for the past couple of weeks, and is wondering what it could be from.  She has recently underwent plain films of the area that were inconclusive.  She is concerned about the tick up in her tumor markers.    Tracie [name] denies any fever, chills, chest pain, palpitations, cough, shortness of breath, bowel/bladder changes, headaches, nausea, vomiting, skin change, or any other concerns.  A detailed ROS Was otherwise non contributory.    HISTORY OF CURRENT ILLNESS: From the original intake note:  Tracie [name] [name] noted a change in her right breast sometime in July 2018. [facility] to report that she had found a "kernel" in her breast. She was advised to see her primary  physician which she did. The patient was then set up for bilateral diagnostic mammography with tomography and right breast ultrasonography at [facility] 09/03/2017. The breast density was category I a. In the upper outer quadrant of the right breast there was a 2.1 cm high density mass, which was palpable. Also in the right breast more posteriorly there was a 1.5 cm high density mass which was felt to be a suspicious lymph node. Ultrasound of the right breast confirmed a 2.1 centimeter lobulated solid mass in the right breast upper outer quadrant 15.8 cm from the nipple in the 10:00 radiant. There was a 1.5 cm oval mass in the right axillary tail with a small rounded masses also suspicious for lymph node involvement. A total of 3 suspicious lymph nodes were identified.  On 09/03/2017 the patient underwent biopsy of the breast mass and the suspicious right axillary lymph node. The final pathology ([id number]) found invasive ductal carcinoma, grade 3, in both. Both tumors were estrogen receptor positive at 70-75%, and both were progesterone receptor negative. Both had an elevated proliferation marker at 90%. The mass in the breast was HER-2 negative, with a signals ratio of 1.25 and the number per cell 1.88. The lymph node mass however was HER-2 positive with a signals ratio of 2.57, and the number per cell 3.60.  The patient's subsequent history is as detailed below.   PAST MEDICAL HISTORY: Past Medical History:  Diagnosis Date  . Angina pectoris ([place])    history of   . Arthritis   . Asthma   . Cancer ([place])    right breast  . Constipation   . Diverticulitis   .  History of radiation therapy 04/07/18- 05/11/18   50 Gy in 25 fractions to right breast and regional nodes with four fields (no boost)  . History of radiation therapy 04/21/2018   T8 spine, 18 Gy in 1 fraction for a total dose of 18 Gy.   Marland Kitchen HPV in female   . Hyperlipidemia   . Hypertension   . Hypothyroidism   . Wears dentures   . Wears  glasses     PAST SURGICAL HISTORY: Past Surgical History:  Procedure Laterality Date  . BREAST LUMPECTOMY WITH RADIOACTIVE SEED AND SENTINEL LYMPH NODE BIOPSY Right 03/08/2018   Procedure: RIGHT BREAST RADIOACTIVE SEED GUIDED LUMPECTOMY WITH RIGHT AXILLARY RADIOACTIVE SEED TARGETED LYMPH NODE EXCISION AND SENTINEL LYMPH NODE BIOPSY, INJECT BLUE DYE RIGHT BREAST;  Surgeon: [name], MD;  Location: [place];  Service: General;  Laterality: Right;  . CESAREAN SECTION    . COLON SURGERY  2012   sigmoidectomy  . COLOSTOMY    . COLOSTOMY TAKEDOWN  10/06/11  . IR BONE TUMOR(S)RF ABLATION  11/02/2017  . IR KYPHO THORACIC WITH BONE BIOPSY  11/02/2017  . IR RADIOLOGIST EVAL & MGMT  10/07/2017  . IR RADIOLOGIST EVAL & MGMT  11/25/2017  . PORTACATH PLACEMENT N/A 09/25/2017   Procedure: INSERTION PORT-A-CATH WITH ULTRA SOUND ERAS PATHWAY;  Surgeon: [name], MD;  Location: [place];  Service: General;  Laterality: N/A;  . TONSILLECTOMY    . TUBAL LIGATION      FAMILY HISTORY: Family History  Problem Relation Age of Onset  . Cancer Mother        rectal  . Hypertension Brother   . Hypertension Brother   . Asthma Son   . Asthma Brother    The patient has little information regarding her father. Her mother died at age 90 from a strange cancer--the patient does not know what it was, it may well have been cervical cancer from her description. The patient has one brother, no sisters. There is no history of breast or ovarian cancer in the family to the patient's knowledge    GYNECOLOGIC HISTORY:  No LMP recorded. Patient is postmenopausal.  menarche age 24, first live birth age 41 the patient is [name] P3. She stopped having periods at age 38. She never used oral contraceptives or hormone replacement.    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  She is currently working out of her home.  She is hoping to retire October 2020 she is divorced. Currently her  son Tracie [name] [name] stays with her sometimes. He is a Art gallery manager and therefore not working during the pandemic. The 2 other children are Tracie [name] [name], who lives in [place] and works in the theater and media, and Danese Dorsainvil lives in [place], and is vice president of a [employer]. The patient has 5 grandchildren. She is a Psychologist, forensic.   ADVANCED DIRECTIVES: Not in place    HEALTH MAINTENANCE: Social History   Tobacco Use  . Smoking status: Current Some Day Smoker    Packs/day: 0.25    Years: 40.00    Pack years: 10.00    Types: Cigarettes  . Smokeless tobacco: Never Used  . Tobacco comment: she is smoking 2 or more daily, she plans to use nicotine patch.   Substance Use Topics  . Alcohol use: Yes    Comment: occasional  . Drug use: No    Colonoscopy: September 2012   PAP:  Bone density:09/03/2017  Allergies  Allergen Reactions  . Fish Allergy Anaphylaxis and Swelling    Swelling of hands and feet., non shellfish allergy  . Other Anaphylaxis    Tree nuts   . Ace Inhibitors Other (See Comments) and Cough    Cold like symptoms   . Tylenol [Acetaminophen] Other (See Comments)    Headache     Current Outpatient Medications  Medication Sig Dispense Refill  . ADVAIR DISKUS 250-50 MCG/DOSE AEPB INL 1 PUFF ITL Q 12 H    . albuterol (PROAIR HFA) 108 (90 Base) MCG/ACT inhaler INHALE 2 PUFFS BY MOUTH INTO THE LUNGS EVERY 6 HOURS AS NEEDED FOR WHEEZING    . Alirocumab (PRALUENT) 75 MG/ML SOPN Inject 75 mg into the skin every 14 (fourteen) days. 2 pen 11  . amLODipine (NORVASC) 10 MG tablet Take 10 mg by mouth daily.     Marland Kitchen anastrozole (ARIMIDEX) 1 MG tablet TAKE 1 TABLET(1 MG) BY MOUTH DAILY 90 tablet 4  . aspirin EC 81 MG tablet Take 1 tablet (81 mg total) by mouth daily. 90 tablet 3  . cetirizine (ZYRTEC) 10 MG tablet Take 10 mg by mouth daily as needed for allergies.   11  . levothyroxine (SYNTHROID, LEVOTHROID) 50 MCG tablet Take 50 mcg by mouth daily.    Marland Kitchen  lidocaine-prilocaine (EMLA) cream Apply 1 application topically as needed (port access). 30 g 0  . losartan-hydrochlorothiazide (HYZAAR) 100-12.5 MG tablet Take 1 tablet by mouth daily.   3  . metoprolol succinate (TOPROL XL) 25 MG 24 hr tablet Take 3 tablets (75 mg total) by mouth daily. 270 tablet 3  . nicotine (NICODERM CQ - DOSED IN MG/24 HOURS) 14 mg/24hr patch Place 1 patch (14 mg total) onto the skin daily. 28 patch 0  . nitroGLYCERIN (NITROSTAT) 0.4 MG SL tablet Place 1 tablet (0.4 mg total) under the tongue every 5 (five) minutes as needed for chest pain. 75 tablet 3  . potassium chloride (K-DUR) 10 MEQ tablet TAKE 2 TABLETS(20 MEQ) BY MOUTH DAILY 60 tablet 0   No current facility-administered medications for this visit.     OBJECTIVE: Middle-aged African-American woman in no acute distress  Vitals:   11/16/19 1330  BP: (!) 170/92  Pulse: 86  Resp: 18  Temp: 98.3 F (36.8 C)  SpO2: 97%     Body mass index is 30.57 kg/m.   Wt Readings from Last 3 Encounters:  11/16/19 172 lb 9.6 oz (78.3 kg)  10/26/19 177 lb (80.3 kg)  10/06/19 172 lb 14.4 oz (78.4 kg)   ECOG FS:1  GENERAL: Patient is a well appearing female in no acute distress HEENT:  Sclerae anicteric.  Oropharynx clear and moist. No ulcerations or evidence of oropharyngeal candidiasis. Neck is supple.  NODES:  No cervical, supraclavicular, or axillary lymphadenopathy palpated.  BREAST EXAM:  Deferred today LUNGS:  Clear to auscultation bilaterally.  No wheezes or rhonchi. HEART:  Regular rate and rhythm. No murmur appreciated. ABDOMEN:  Soft, nontender.  Positive, normoactive bowel sounds. No organomegaly palpated. MSK:  No focal spinal tenderness to palpation. Full range of motion bilaterally in the upper extremities. EXTREMITIES:  No peripheral edema.   SKIN:  Clear with no obvious rashes or skin changes. No nail dyscrasia. NEURO:  Nonfocal. Well oriented.  Appropriate affect.   LAB RESULTS:  CMP      Component Value Date/Time   NA 142 11/16/2019 1305   NA 138 12/24/2017 0752   K 3.5 11/16/2019 1305   K  3.3 (L) 12/24/2017 0752   CL 104 11/16/2019 1305   CO2 26 11/16/2019 1305   CO2 25 12/24/2017 0752   GLUCOSE 91 11/16/2019 1305   GLUCOSE 99 12/24/2017 0752   BUN 11 11/16/2019 1305   BUN 7.9 12/24/2017 0752   CREATININE 0.84 11/16/2019 1305   CREATININE 0.7 12/24/2017 0752   CALCIUM 9.5 11/16/2019 1305   CALCIUM 9.2 12/24/2017 0752   PROT 7.1 11/16/2019 1305   PROT 6.3 (L) 12/24/2017 0752   ALBUMIN 3.3 (L) 11/16/2019 1305   ALBUMIN 3.4 (L) 12/24/2017 0752   AST 11 (L) 11/16/2019 1305   AST 15 12/24/2017 0752   ALT 12 11/16/2019 1305   ALT 18 12/24/2017 0752   ALKPHOS 124 11/16/2019 1305   ALKPHOS 93 12/24/2017 0752   BILITOT <0.2 (L) 11/16/2019 1305   BILITOT <0.22 12/24/2017 0752   GFRNONAA >60 11/16/2019 1305   GFRAA >60 11/16/2019 1305    No results found for: Ronnald Ramp, A1GS, A2GS, BETS, BETA2SER, GAMS, MSPIKE, SPEI  No results found for: Nils Pyle, [facility]  Lab Results  Component Value Date   WBC 5.0 11/16/2019   NEUTROABS 2.8 11/16/2019   HGB 11.1 (L) 11/16/2019   HCT 35.2 (L) 11/16/2019   MCV 88.0 11/16/2019   PLT 409 (H) 11/16/2019      Chemistry      Component Value Date/Time   NA 142 11/16/2019 1305   NA 138 12/24/2017 0752   K 3.5 11/16/2019 1305   K 3.3 (L) 12/24/2017 0752   CL 104 11/16/2019 1305   CO2 26 11/16/2019 1305   CO2 25 12/24/2017 0752   BUN 11 11/16/2019 1305   BUN 7.9 12/24/2017 0752   CREATININE 0.84 11/16/2019 1305   CREATININE 0.7 12/24/2017 0752      Component Value Date/Time   CALCIUM 9.5 11/16/2019 1305   CALCIUM 9.2 12/24/2017 0752   ALKPHOS 124 11/16/2019 1305   ALKPHOS 93 12/24/2017 0752   AST 11 (L) 11/16/2019 1305   AST 15 12/24/2017 0752   ALT 12 11/16/2019 1305   ALT 18 12/24/2017 0752   BILITOT <0.2 (L) 11/16/2019 1305   BILITOT <0.22 12/24/2017 0752       No  results found for: LABCA2  No components found for: [id number]    Appointment on 11/16/2019  Component Date Value Ref Range Status  . CA 27.29 11/16/2019 154.4* 0.0 - 38.6 U/mL Final   Comment: (NOTE) Siemens Centaur Immunochemiluminometric Methodology [facility]) Values obtained with different assay methods or kits cannot be used interchangeably. Results cannot be interpreted as absolute evidence of the presence or absence of malignant disease. Performed At: [facility] [place], [place] [id number] Rush Farmer MD [id number]   . Sodium 11/16/2019 142  135 - 145 mmol/L Final  . Potassium 11/16/2019 3.5  3.5 - 5.1 mmol/L Final  . Chloride 11/16/2019 104  98 - 111 mmol/L Final  . CO2 11/16/2019 26  22 - 32 mmol/L Final  . Glucose, Bld 11/16/2019 91  70 - 99 mg/dL Final  . BUN 11/16/2019 11  8 - 23 mg/dL Final  . Creatinine 11/16/2019 0.84  0.44 - 1.00 mg/dL Final  . Calcium 11/16/2019 9.5  8.9 - 10.3 mg/dL Final  . Total Protein 11/16/2019 7.1  6.5 - 8.1 g/dL Final  . Albumin 11/16/2019 3.3* 3.5 - 5.0 g/dL Final  . AST 11/16/2019 11* 15 - 41 U/L Final  . ALT 11/16/2019 12  0 - 44 U/L Final  .  Alkaline Phosphatase 11/16/2019 124  38 - 126 U/L Final  . Total Bilirubin 11/16/2019 <0.2* 0.3 - 1.2 mg/dL Final  . GFR, Est Non Af Am 11/16/2019 >60  >60 mL/min Final  . GFR, Est AFR Am 11/16/2019 >60  >60 mL/min Final  . Anion gap 11/16/2019 12  5 - 15 Final   Performed at [facility], [place] [address] [place], [place] [address]  . WBC Count 11/16/2019 5.0  4.0 - 10.5 K/uL Final  . RBC 11/16/2019 4.00  3.87 - 5.11 MIL/uL Final  . Hemoglobin 11/16/2019 11.1* 12.0 - 15.0 g/dL Final  . HCT 11/16/2019 35.2* 36.0 - 46.0 % Final  . MCV 11/16/2019 88.0  80.0 - 100.0 fL Final  . MCH 11/16/2019 27.8  26.0 - 34.0 pg Final  . MCHC 11/16/2019 31.5  30.0 - 36.0 g/dL Final  . RDW 11/16/2019 17.5* 11.5 - 15.5 % Final  . Platelet Count 11/16/2019 409*  150 - 400 K/uL Final  . nRBC 11/16/2019 0.0  0.0 - 0.2 % Final  . Neutrophils Relative % 11/16/2019 55  % Final  . Neutro Abs 11/16/2019 2.8  1.7 - 7.7 K/uL Final  . Lymphocytes Relative 11/16/2019 25  % Final  . Lymphs Abs 11/16/2019 1.2  0.7 - 4.0 K/uL Final  . Monocytes Relative 11/16/2019 14  % Final  . Monocytes Absolute 11/16/2019 0.7  0.1 - 1.0 K/uL Final  . Eosinophils Relative 11/16/2019 5  % Final  . Eosinophils Absolute 11/16/2019 0.2  0.0 - 0.5 K/uL Final  . Basophils Relative 11/16/2019 0  % Final  . Basophils Absolute 11/16/2019 0.0  0.0 - 0.1 K/uL Final  . Immature Granulocytes 11/16/2019 1  % Final  . Abs Immature Granulocytes 11/16/2019 0.03  0.00 - 0.07 K/uL Final   Performed at [facility], [address] [place], [place] [address]    (this displays the last labs from the last 3 days)  No results found for: TOTALPROTELP, ALBUMINELP, A1GS, A2GS, BETS, BETA2SER, GAMS, MSPIKE, SPEI (this displays SPEP labs)  No results found for: KPAFRELGTCHN, LAMBDASER, KAPLAMBRATIO (kappa/lambda light chains)  No results found for: HGBA, HGBA2QUANT, HGBFQUANT, HGBSQUAN (Hemoglobinopathy evaluation)   No results found for: LDH  No results found for: IRON, TIBC, IRONPCTSAT (Iron and TIBC)  No results found for: FERRITIN  Urinalysis    Component Value Date/Time   LABSPEC 1.020 02/10/2011 2058   PHURINE 6.0 02/10/2011 2058   HGBUR LARGE (A) 02/10/2011 2058   BILIRUBINUR MODERATE (A) 02/10/2011 2058   KETONESUR 15 (A) 02/10/2011 2058   PROTEINUR >=300 (A) 02/10/2011 2058   UROBILINOGEN >=8.0 02/10/2011 2058   NITRITE NEGATIVE 02/10/2011 2058   LEUKOCYTESUR  02/10/2011 2058    NEGATIVE Biochemical Testing Only. Please order routine urinalysis from main lab if confirmatory testing is needed.     STUDIES: No results found.  ELIGIBLE FOR AVAILABLE RESEARCH PROTOCOL: no   ASSESSMENT: 68 y.o. Tracie [name] [name] woman status post right breast upper  outer quadrant and right axillary lymph node biopsy 09/03/2017, both positive for invasive ductal carcinoma, grade 3, estrogen receptor positive, progesterone receptor negative, with an MIB-1 of 90%, the lymph node being HER-2 positive, the breast mass HER-2 negative  (a) staging studies September 24, 2017 show a lytic lesion in T8, possible areas of concern at L1-L2  (b) biopsy/kyphoplasty/osteocool of T8 lesion 11/02/2017 confirms metastatic adenocarcinoma, 20% estrogen receptor positive, estrogen receptor negative  (c) PET scan 11/30/2017 shows no visceral disease,  minimal metabolic activity at T8, no other bone lesions  (1) neoadjuvant chemotherapy with carboplatin, docetaxel, trastuzumab, and pertuzumab started 10/01/2017, completed 6 cycles 01/26/2018  (a) Docetaxel dropped after three cycles and Gemcitabine/Carbo started on 12/15/2017.   (2) trastuzumab to continue indefinitely  (a) echocardiogram 11/38/2018 shows an ejection fraction in the 55-60% range  (b) echocardiogram 06/17/2019 shows an ejection fraction in the 50-55% range  (3) right lumpectomy and sentinel lymph node sampling 03/08/2018  Showed a pT1(mic) pN0 invasive ductal carcinoma, grade 2, with negative margins.  (4) adjuvant radiation  04-07-18 to 05-11-18                                              (a) SITE/DOSE:  Right Breast and Axilla, SCV nodes / 50 Gy in 25 fx               (b) radiation to her oligo metastatic disease at T8 (18 Gy) in 1 fraction, 04/21/2018  (5) anastrozole started 05/29/2018  (a) bone density test pending  (6) zolendronate started 01/19/2019, repeated every 12 weeks   PLAN:  Tracie [name] [name] is doing well today.  She continues on Anastrozole and Trastuzumab for her breast cancer and is tolerating this well.  She will continue this.  She is also receiving Zoledronate every 12 weeks (last received 10/26/2019).    Phyllis and I reviewed the increase in her tumor markers.  Due to this increase, I  recommended that we move up her scans, and for her to see Dr. Jana Hakim in 3 weeks with her next treatment.  She understands this.    She continues on tylenol/aleve if needed for her hip.    Tracie [name] [name] will undergo restaging on 12/7 and will see Dr. Jana Hakim on 12/9.  She was recommended to continue with the appropriate pandemic precautions. She knows to call for any questions that may arise between now and her next appointment.  We are happy to see her sooner if needed.  A total of (20) minutes of face-to-face time was spent with this patient with greater than 50% of that time in counseling and care-coordination.    Wilber Bihari, NP  11/18/19 7:55 AM Medical Oncology and Hematology [facility] [address] [place], [place] [address] Tel. [phone]    Fax. [phone]

## 2019-11-17 LAB — CANCER ANTIGEN 27.29: CA 27.29: 154.4 U/mL — ABNORMAL HIGH (ref 0.0–38.6)

## 2019-11-18 ENCOUNTER — Encounter: Payer: Self-pay | Admitting: Adult Health

## 2019-11-21 ENCOUNTER — Other Ambulatory Visit: Payer: Self-pay

## 2019-11-21 ENCOUNTER — Ambulatory Visit (HOSPITAL_COMMUNITY)
Admission: RE | Admit: 2019-11-21 | Discharge: 2019-11-21 | Disposition: A | Discharge status: Home / Self Care | Payer: Medicare Other | Source: Ambulatory Visit | Attending: Cardiology | Admitting: Cardiology

## 2019-11-21 ENCOUNTER — Encounter (HOSPITAL_COMMUNITY): Payer: Managed Care, Other (non HMO) | Admitting: Cardiology

## 2019-11-21 ENCOUNTER — Other Ambulatory Visit (HOSPITAL_COMMUNITY): Payer: Managed Care, Other (non HMO)

## 2019-11-21 ENCOUNTER — Ambulatory Visit (HOSPITAL_BASED_OUTPATIENT_CLINIC_OR_DEPARTMENT_OTHER)
Admission: RE | Admit: 2019-11-21 | Discharge: 2019-11-21 | Disposition: A | Discharge status: Home / Self Care | Payer: Medicare Other | Source: Ambulatory Visit | Attending: Cardiology | Admitting: Cardiology

## 2019-11-21 ENCOUNTER — Encounter (HOSPITAL_COMMUNITY): Payer: Self-pay | Admitting: Cardiology

## 2019-11-21 VITALS — BP 156/87 | HR 79 | Wt 172.0 lb

## 2019-11-21 DIAGNOSIS — F17210 Nicotine dependence, cigarettes, uncomplicated: Secondary | ICD-10-CM | POA: Diagnosis not present

## 2019-11-21 DIAGNOSIS — Z791 Long term (current) use of non-steroidal anti-inflammatories (NSAID): Secondary | ICD-10-CM | POA: Diagnosis not present

## 2019-11-21 DIAGNOSIS — Z79899 Other long term (current) drug therapy: Secondary | ICD-10-CM | POA: Diagnosis not present

## 2019-11-21 DIAGNOSIS — I080 Rheumatic disorders of both mitral and aortic valves: Secondary | ICD-10-CM | POA: Diagnosis not present

## 2019-11-21 DIAGNOSIS — Z9049 Acquired absence of other specified parts of digestive tract: Secondary | ICD-10-CM | POA: Diagnosis not present

## 2019-11-21 DIAGNOSIS — E785 Hyperlipidemia, unspecified: Secondary | ICD-10-CM | POA: Insufficient documentation

## 2019-11-21 DIAGNOSIS — Z170 Estrogen receptor positive status [ER+]: Secondary | ICD-10-CM | POA: Insufficient documentation

## 2019-11-21 DIAGNOSIS — E039 Hypothyroidism, unspecified: Secondary | ICD-10-CM | POA: Diagnosis not present

## 2019-11-21 DIAGNOSIS — Z7951 Long term (current) use of inhaled steroids: Secondary | ICD-10-CM | POA: Diagnosis not present

## 2019-11-21 DIAGNOSIS — C50411 Malignant neoplasm of upper-outer quadrant of right female breast: Secondary | ICD-10-CM

## 2019-11-21 DIAGNOSIS — I255 Ischemic cardiomyopathy: Secondary | ICD-10-CM | POA: Diagnosis not present

## 2019-11-21 DIAGNOSIS — I2510 Atherosclerotic heart disease of native coronary artery without angina pectoris: Secondary | ICD-10-CM | POA: Diagnosis not present

## 2019-11-21 DIAGNOSIS — I10 Essential (primary) hypertension: Secondary | ICD-10-CM | POA: Diagnosis not present

## 2019-11-21 DIAGNOSIS — Z7982 Long term (current) use of aspirin: Secondary | ICD-10-CM | POA: Insufficient documentation

## 2019-11-21 DIAGNOSIS — Z79890 Hormone replacement therapy (postmenopausal): Secondary | ICD-10-CM | POA: Diagnosis not present

## 2019-11-21 DIAGNOSIS — J45909 Unspecified asthma, uncomplicated: Secondary | ICD-10-CM | POA: Insufficient documentation

## 2019-11-21 LAB — LIPID PANEL
Cholesterol: 122 mg/dL (ref 0–200)
HDL: 49 mg/dL (ref >40)
LDL Cholesterol: 62 mg/dL (ref 0–99)
Total CHOL/HDL Ratio: 2.5 RATIO
Triglycerides: 57 mg/dL (ref <150)
VLDL: 11 mg/dL (ref 0–40)

## 2019-11-21 MED ORDER — METOPROLOL SUCCINATE ER 100 MG PO TB24: 100.0000 mg | ORAL_TABLET | Freq: Every day | ORAL | 4 refills | Status: DC

## 2019-11-21 NOTE — Progress Notes (Signed)
  Echocardiogram 2D Echocardiogram has been performed.  Tracie [name] 11/21/2019, 11:58 AM

## 2019-11-21 NOTE — Patient Instructions (Signed)
Labs today We will only contact you if something comes back abnormal or we need to make some changes. Otherwise no news is good news!  INCREASE Toprol XL to 100mg  (1 tab) daily  Your physician has requested that you have an echocardiogram. Echocardiography is a painless test that uses sound waves to create images of your heart. It provides your doctor with information about the size and shape of your heart and how well your heart's chambers and valves are working. This procedure takes approximately one hour. There are no restrictions for this procedure.  Your physician recommends that you schedule a follow-up appointment in: 4 months with Dr Aundra Dubin with an ECHO before visit.   Please call office at [phone] option 2 if you have any questions or concerns.   At the [facility], you and your health needs are our priority. As part of our continuing mission to provide you with exceptional heart care, we have created designated Provider Care Teams. These Care Teams include your primary Cardiologist (physician) and Advanced Practice Providers (APPs- Physician Assistants and Nurse Practitioners) who all work together to provide you with the care you need, when you need it.   You may see any of the following providers on your designated Care Team at your next follow up: Marland Kitchen Dr Glori Bickers . Dr Loralie Champagne . Darrick Grinder, NP . Lyda Jester, PA   Please be sure to bring in all your medications bottles to every appointment.

## 2019-11-21 NOTE — Progress Notes (Signed)
Oncology: Dr. Jana Hakim Cardiology: Dr. Aundra Dubin  68 y.o. with history of HTN, asthma, and hyperlidemia was initially referred by Dr. Jana Hakim for cardio-oncology evaluation.  She was diagnosed with breast cancer on the right in 9/18. ER+/PR-.  Lymph node was HER2+, breast biopsy was HER2-.  She completed neoadjuvant chemotherapy with carboplatin, docetaxel, trastuzumab, pertuzumab x 6 cycles and now is on trastuzumab alone with plan for indefinite treatment.      Echo in 12/19 showed EF 50-55%, basal anterolateral and inferolateral hypokinesis, basal inferior severe hypokinesis.  GLS low at -13.6%.  Given fall in EF with regional wall motion abnormalities, coronary CTA was done, showing occluded mid LCx and possible severe stenosis in the proximal PDA.    Echo in 2/20 showed that EF remained in the 50-55% range with basal inferolateral hypokinesis.   We discussed cardiac cath but she was not having chest pain and decided against it.    Echo was done again today and reviewed, EF 50% with basal inferior and basal-mid inferolateral akinesis, mild-moderate AI, moderate MR (likely infarct-related).   She returns for followup of CAD and cardiomyopathy. She continues to smoke 1/2 ppd but still thinking about using nicotine patches.  She continues to get Herceptin (will be long-term).  No exertional chest pain.  She gets short of breath after going up and down stairs a couple of times.  Main complaint is sciatica-type pain from right lower back down right leg.       Labs (3/19): LDL 60 Labs (6/19): K 3.3, creatinine 1.05 Labs (11/19): K 3.9, creatinine 0.86 Labs (12/19): K 3.5, creatinine 0.89 Labs (1/20): K 3.3, creatinine 0.86 Labs (11/20): hgb 11.1, K 3.5, creatinine 0.84  PMH: 1. Asthma: mild. 2. HTN 3. Hyperlipidemia 4. Hypothyroidism 5. Diverticulitis with h/o colectomy.  6. Breast cancer: On right, diagnosed 9/18.  ER+/PR-.  Lymph node was HER2+, breast biopsy was HER2-.  She is getting  neoadjuvant chemotherapy with carboplatin, docetaxel, trastuzumab, pertuzumab x 6 cycles then trastuzumab indefinitely She will have surgery and radiation.  - Echo (9/18): EF 50-55%, restrictive diastolic function, mild AI, mild-moderate MR.  - Echo (12/18): EF 55-60% with mild focal basal septal hypertrophy, GLS -20.5%, normal RV size and systolic function, mild AI, moderate MR.  - Echo (3/19): EF 60-65%, mild LVH, moderate diastolic dysfunction, strain inaccurate, mild AI, moderate MR, normla RV size and systolic function.  - Echo (6/19): EF 60-65%, mild LVH, GLS -16.4%, moderate diastolic dysfunction, normal RV size and systolic function, moderate MR, mild AI.  - Echo (9/19): EF 55-60%, moderate diastolic dysfunction, moderate MR, mild AI, GLS -13.7% (but using a different machine).  - Echo (12/19): EF 50-55%, basal anterolateral and inferolateral hypokinesis, basal inferior severe hypokinesis.  Grade II diastolic dysfunction, GLS low at -13.6%.  Normal RV size and systolic function.  - Echo (2/20): EF 50-55%, mild LVH, GLS -12.2%, basal inferolateral hypokinesis, normal RV size and systolic function, moderate mitral regurgitation (?ischemic MR).  - Echo (11/20): EF 50% with basal inferior and basal-mid inferolateral akinesis, mild-moderate AI, moderate MR (likely infarct-related).  7. Active smoker 8. CAD: Coronary CTA (12/19) with possible severe PDA stenosis (unable to assess by FFR), mid LAD non-significant stenosis (FFR 0.81), and occluded mid-distal LCx.  9. Mitral regurgitation: Moderate on 11/20 echo, possibly infarct-related MR.   Social History   Socioeconomic History  . Marital status: Divorced    Spouse name: Not on file  . Number of children: Not on file  . Years of education:  Not on file  . Highest education level: Not on file  Occupational History  . Not on file  Social Needs  . Financial resource strain: Not on file  . Food insecurity    Worry: Not on file    Inability:  Not on file  . Transportation needs    Medical: Not on file    Non-medical: Not on file  Tobacco Use  . Smoking status: Current Some Day Smoker    Packs/day: 0.25    Years: 40.00    Pack years: 10.00    Types: Cigarettes  . Smokeless tobacco: Never Used  . Tobacco comment: she is smoking 2 or more daily, she plans to use nicotine patch.   Substance and Sexual Activity  . Alcohol use: Yes    Comment: occasional  . Drug use: No  . Sexual activity: Not on file  Lifestyle  . Physical activity    Days per week: Not on file    Minutes per session: Not on file  . Stress: Not on file  Relationships  . Social Herbalist on phone: Not on file    Gets together: Not on file    Attends religious service: Not on file    Active member of club or organization: Not on file    Attends meetings of clubs or organizations: Not on file    Relationship status: Not on file  . Intimate partner violence    Fear of current or ex partner: Not on file    Emotionally abused: Not on file    Physically abused: Not on file    Forced sexual activity: Not on file  Other Topics Concern  . Not on file  Social History Narrative  . Not on file   Family History  Problem Relation Age of Onset  . Cancer Mother        rectal  . Hypertension Brother   . Hypertension Brother   . Asthma Son   . Asthma Brother    ROS: All systems reviewed and negative except as per HPI.   Current Outpatient Medications  Medication Sig Dispense Refill  . ADVAIR DISKUS 250-50 MCG/DOSE AEPB INL 1 PUFF ITL Q 12 H    . albuterol (PROAIR HFA) 108 (90 Base) MCG/ACT inhaler INHALE 2 PUFFS BY MOUTH INTO THE LUNGS EVERY 6 HOURS AS NEEDED FOR WHEEZING    . Alirocumab (PRALUENT) 75 MG/ML SOPN Inject 75 mg into the skin every 14 (fourteen) days. 2 pen 11  . amLODipine (NORVASC) 10 MG tablet Take 10 mg by mouth daily.     Marland Kitchen anastrozole (ARIMIDEX) 1 MG tablet TAKE 1 TABLET(1 MG) BY MOUTH DAILY 90 tablet 4  . aspirin EC 81 MG  tablet Take 1 tablet (81 mg total) by mouth daily. 90 tablet 3  . cetirizine (ZYRTEC) 10 MG tablet Take 10 mg by mouth daily as needed for allergies.   11  . levothyroxine (SYNTHROID, LEVOTHROID) 50 MCG tablet Take 50 mcg by mouth daily.    Marland Kitchen lidocaine-prilocaine (EMLA) cream Apply 1 application topically as needed (port access). 30 g 0  . losartan-hydrochlorothiazide (HYZAAR) 100-12.5 MG tablet Take 1 tablet by mouth daily.   3  . metoprolol succinate (TOPROL-XL) 100 MG 24 hr tablet Take 1 tablet (100 mg total) by mouth daily. 30 tablet 4  . naproxen (NAPROSYN) 500 MG tablet TAKE 1 TABLET(500 MG) BY MOUTH TWICE DAILY WITH MEALS FOR BACK OR LEG PAIN    .  nicotine (NICODERM CQ - DOSED IN MG/24 HOURS) 14 mg/24hr patch Place 1 patch (14 mg total) onto the skin daily. 28 patch 0  . nitroGLYCERIN (NITROSTAT) 0.4 MG SL tablet Place 1 tablet (0.4 mg total) under the tongue every 5 (five) minutes as needed for chest pain. 75 tablet 3  . potassium chloride (K-DUR) 10 MEQ tablet TAKE 2 TABLETS(20 MEQ) BY MOUTH DAILY 60 tablet 0   No current facility-administered medications for this encounter.    BP (!) 156/87   Pulse 79   Wt 78 kg (172 lb)   SpO2 100%   BMI 30.47 kg/m   General: NAD Neck: No JVD, no thyromegaly or thyroid nodule.  Lungs: Clear to auscultation bilaterally with normal respiratory effort. CV: Nondisplaced PMI.  Heart regular S1/S2, no S3/S4, 2/6 early SEM RUSB.  No peripheral edema.  No carotid bruit.  Normal pedal pulses.  Abdomen: Soft, nontender, no hepatosplenomegaly, no distention.  Skin: Intact without lesions or rashes.  Neurologic: Alert and oriented x 3.  Psych: Normal affect. Extremities: No clubbing or cyanosis.  HEENT: Normal.   Assessment/Plan: 1. Smoking: I encouraged her again to quit. She is still thinking about nicotine patches and does not want Chantix or bupropion.  2. HTN: BP elevated, increase Toprol XL to 100 mg daily.  3. Hyperlipidemia: Continue Praluent.  Check lipids.  4. Ischemic cardiomyopathy: Echo was done today and reviewed, showing EF mildly decreased at 50% with inferolateral akinesis.  I do no think that this is due to Herceptin, think this is likely an ischemic cardiomyopathy related to circumflex coronary disease.  She has not had any chest pain.   - For now, will manage medically (this is also patient's preference after extensive discussions).  - Continue losartan 100 mg daily.  - Increase Toprol XL to 100 mg daily.  - I think that she can continue Herceptin, will repeat echo in 4 months to follow for Herceptin-related cardiotoxicity.  5. Breast cancer: Plan now is for indefinite Herceptin. As above, suspect decreased EF is due to CAD and not Herceptin.  Ok to continue Herceptin.  6. CAD: Significant CAD on coronary CTA in 12/19.  The mid LCx appears occluded and there appears to be a severe stenosis in the PDA (unable to assess PDA by FFR).  I suspect she had an MI causing the inferolateral wall motion abnormalities on echo (occlusion of LCx).  No recent chest pain.  - As above, continue medical management of CAD with minimal symptoms.   - Continue Praluent, check lipids today.  - Continue ASA 81 daily.  - Stop smoking as above.    Followup in 4 months with echo.   Loralie Champagne 11/21/2019

## 2019-11-23 ENCOUNTER — Other Ambulatory Visit (HOSPITAL_COMMUNITY): Payer: Self-pay | Admitting: Cardiology

## 2019-11-28 ENCOUNTER — Other Ambulatory Visit (HOSPITAL_COMMUNITY): Payer: Self-pay

## 2019-11-28 MED ORDER — METOPROLOL SUCCINATE ER 100 MG PO TB24: 100.0000 mg | ORAL_TABLET | Freq: Every day | ORAL | 4 refills | Status: AC

## 2019-11-30 ENCOUNTER — Other Ambulatory Visit: Payer: Managed Care, Other (non HMO)

## 2019-11-30 ENCOUNTER — Ambulatory Visit: Payer: Managed Care, Other (non HMO)

## 2019-12-05 ENCOUNTER — Other Ambulatory Visit: Payer: Self-pay

## 2019-12-05 ENCOUNTER — Encounter (HOSPITAL_COMMUNITY)
Admission: RE | Admit: 2019-12-05 | Discharge: 2019-12-05 | Disposition: A | Discharge status: Home / Self Care | Payer: Medicare Other | Source: Ambulatory Visit | Attending: Oncology | Admitting: Oncology

## 2019-12-05 ENCOUNTER — Ambulatory Visit (HOSPITAL_COMMUNITY)
Admission: RE | Admit: 2019-12-05 | Discharge: 2019-12-05 | Disposition: A | Discharge status: Home / Self Care | Payer: Medicare Other | Source: Ambulatory Visit | Attending: Oncology | Admitting: Oncology

## 2019-12-05 DIAGNOSIS — C50411 Malignant neoplasm of upper-outer quadrant of right female breast: Secondary | ICD-10-CM | POA: Insufficient documentation

## 2019-12-05 DIAGNOSIS — Z170 Estrogen receptor positive status [ER+]: Secondary | ICD-10-CM

## 2019-12-05 MED ORDER — HEPARIN SOD (PORK) LOCK FLUSH 100 UNIT/ML IV SOLN
INTRAVENOUS | Status: AC
  Filled 2019-12-05: qty 5

## 2019-12-05 MED ORDER — IOHEXOL 300 MG/ML  SOLN
75.0000 mL | Freq: Once | INTRAMUSCULAR | Status: AC | PRN
  Administered 2019-12-05: 12:00:00 75 mL via INTRAVENOUS

## 2019-12-05 MED ORDER — SODIUM CHLORIDE (PF) 0.9 % IJ SOLN
INTRAMUSCULAR | Status: AC
  Filled 2019-12-05: qty 50

## 2019-12-05 MED ORDER — TECHNETIUM TC 99M MEDRONATE IV KIT
20.0000 | PACK | Freq: Once | INTRAVENOUS | Status: AC | PRN
  Administered 2019-12-05: 19.5 via INTRAVENOUS

## 2019-12-05 MED ORDER — HEPARIN SOD (PORK) LOCK FLUSH 100 UNIT/ML IV SOLN
500.0000 [IU] | Freq: Once | INTRAVENOUS | Status: AC
  Administered 2019-12-05: 500 [IU] via INTRAVENOUS

## 2019-12-06 NOTE — Progress Notes (Deleted)
[place]  [phone]) [phone] [phone]     ID: Tracie [name] DOB: December 19, 1951  MR#: [id number]  ZHG#:[id number]  Patient Care Team: Bartholome Bill, MD as PCP - General (Family Medicine) [name], MD as Consulting Physician (General Surgery) Magrinat, [name], MD as Consulting Physician (Oncology) [name], MD as Attending Physician (Radiation Oncology) Mottinger, [place] DDS (Physical Therapy) [name], MD as Consulting Physician (Cardiology) [name], [name], NP as Nurse Practitioner (Hematology and Oncology) OTHER MD:   CHIEF COMPLAINT: Triple positive breast cancer  CURRENT TREATMENT: trastuzumab; anastrozole, zoledronate   INTERVAL HISTORY: [name] returns today for follow-up and treatment of her triple positive breast cancer.  She continues on anastrozole. She tolerates this well and without any noticeable side effects. She denies hot flashes, noting she is cold even in the hot weather.  She also continues on trastuzumab.  She tolerates this well.   Since her last visit, she underwent repeat echocardiogram on 11/21/2019, which showed an ejection fraction of 50%.  She also underwent chest CT on 12/05/2019, which showed: interval development of tiny bilateral pulmonary nodules, highly concerning for metastatic disease; interval development of ill-defined hypoattenuating lesions in the liver parenchyma, poorly characterized on today's study; stable nodular thickening of adrenal glands, most likely related to adenoma/hyperplasia.  Bone scan performed the same day showed no abnormal accumulation of radiotracer within the axillary or appendicular skeleton to suggest skeletal metastases.   REVIEW OF SYSTEMS: Madai    HISTORY OF CURRENT ILLNESS: From the original intake note:  Tracie [name] noted a change in her right breast sometime in July 2018. [facility] to report that she had found a  "kernel" in her breast. She was advised to see her primary physician which she did. The patient was then set up for bilateral diagnostic mammography with tomography and right breast ultrasonography at [facility] 09/03/2017. The breast density was category I a. In the upper outer quadrant of the right breast there was a 2.1 cm high density mass, which was palpable. Also in the right breast more posteriorly there was a 1.5 cm high density mass which was felt to be a suspicious lymph node. Ultrasound of the right breast confirmed a 2.1 centimeter lobulated solid mass in the right breast upper outer quadrant 15.8 cm from the nipple in the 10:00 radiant. There was a 1.5 cm oval mass in the right axillary tail with a small rounded masses also suspicious for lymph node involvement. A total of 3 suspicious lymph nodes were identified.  On 09/03/2017 the patient underwent biopsy of the breast mass and the suspicious right axillary lymph node. The final pathology ([id number]) found invasive ductal carcinoma, grade 3, in both. Both tumors were estrogen receptor positive at 70-75%, and both were progesterone receptor negative. Both had an elevated proliferation marker at 90%. The mass in the breast was HER-2 negative, with a signals ratio of 1.25 and the number per cell 1.88. The lymph node mass however was HER-2 positive with a signals ratio of 2.57, and the number per cell 3.60.  The patient's subsequent history is as detailed below.   PAST MEDICAL HISTORY: Past Medical History:  Diagnosis Date   Angina pectoris [facility])    history of    Arthritis    Asthma    Cancer ([place])    right breast   Constipation    Diverticulitis    History of radiation therapy 04/07/18- 05/11/18   50 Gy in 25  fractions to right breast and regional nodes with four fields (no boost)   History of radiation therapy 04/21/2018   T8 spine, 18 Gy in 1 fraction for a total dose of 18 Gy.    HPV in female    Hyperlipidemia     Hypertension    Hypothyroidism    Wears dentures    Wears glasses     PAST SURGICAL HISTORY: Past Surgical History:  Procedure Laterality Date   BREAST LUMPECTOMY WITH RADIOACTIVE SEED AND SENTINEL LYMPH NODE BIOPSY Right 03/08/2018   Procedure: RIGHT BREAST RADIOACTIVE SEED GUIDED LUMPECTOMY WITH RIGHT AXILLARY RADIOACTIVE SEED TARGETED LYMPH NODE EXCISION AND SENTINEL LYMPH NODE BIOPSY, INJECT BLUE DYE RIGHT BREAST;  Surgeon: [name], MD;  Location: [facility];  Service: General;  Laterality: Right;   CESAREAN SECTION     COLON SURGERY  2012   sigmoidectomy   COLOSTOMY     COLOSTOMY TAKEDOWN  10/06/11   IR BONE TUMOR(S)RF ABLATION  11/02/2017   IR KYPHO THORACIC WITH BONE BIOPSY  11/02/2017   IR RADIOLOGIST EVAL & MGMT  10/07/2017   IR RADIOLOGIST EVAL & MGMT  11/25/2017   PORTACATH PLACEMENT N/A 09/25/2017   Procedure: INSERTION PORT-A-CATH WITH ULTRA SOUND ERAS PATHWAY;  Surgeon: [name], MD;  Location: [facility];  Service: General;  Laterality: N/A;   TONSILLECTOMY     TUBAL LIGATION      FAMILY HISTORY: Family History  Problem Relation Age of Onset   Cancer Mother        rectal   Hypertension Brother    Hypertension Brother    Asthma Son    Asthma Brother    The patient has little information regarding her father. Her mother died at age 27 from a strange cancer--the patient does not know what it was, it may well have been cervical cancer from her description. The patient has one brother, no sisters. There is no history of breast or ovarian cancer in the family to the patient's knowledge    GYNECOLOGIC HISTORY:  No LMP recorded. Patient is postmenopausal.  menarche age 83, first live birth age 55 the patient is [place] P3. She stopped having periods at age 71. She never used oral contraceptives or hormone replacement.    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  She is currently working out of her home.  She  is hoping to retire October 2020 she is divorced. Currently her son Tracie [name] stays with her sometimes. He is a Art gallery manager and therefore not working during the pandemic. The 2 other children are Tracie CANAL, who lives in [place] and works in the theater and media, and Lilibeth Opie lives in [place], and is vice president of a [employer]. The patient has 5 grandchildren. She is a Psychologist, forensic.   ADVANCED DIRECTIVES: Not in place    HEALTH MAINTENANCE: Social History   Tobacco Use   Smoking status: Current Some Day Smoker    Packs/day: 0.25    Years: 40.00    Pack years: 10.00    Types: Cigarettes   Smokeless tobacco: Never Used   Tobacco comment: she is smoking 2 or more daily, she plans to use nicotine patch.   Substance Use Topics   Alcohol use: Yes    Comment: occasional   Drug use: No    Colonoscopy: September 2012   PAP:  Bone density:09/03/2017    Allergies  Allergen Reactions   Fish Allergy Anaphylaxis and Swelling  Swelling of hands and feet., non shellfish allergy   Other Anaphylaxis    Tree nuts    Ace Inhibitors Other (See Comments) and Cough    Cold like symptoms    Tylenol [Acetaminophen] Other (See Comments)    Headache     Current Outpatient Medications  Medication Sig Dispense Refill   ADVAIR DISKUS 250-50 MCG/DOSE AEPB INL 1 PUFF ITL Q 12 H     albuterol (PROAIR HFA) 108 (90 Base) MCG/ACT inhaler INHALE 2 PUFFS BY MOUTH INTO THE LUNGS EVERY 6 HOURS AS NEEDED FOR WHEEZING     Alirocumab (PRALUENT) 75 MG/ML SOPN Inject 75 mg into the skin every 14 (fourteen) days. 2 pen 11   amLODipine (NORVASC) 10 MG tablet Take 10 mg by mouth daily.      anastrozole (ARIMIDEX) 1 MG tablet TAKE 1 TABLET(1 MG) BY MOUTH DAILY 90 tablet 4   aspirin EC 81 MG tablet Take 1 tablet (81 mg total) by mouth daily. 90 tablet 3   cetirizine (ZYRTEC) 10 MG tablet Take 10 mg by mouth daily as needed for allergies.   11   levothyroxine (SYNTHROID,  LEVOTHROID) 50 MCG tablet Take 50 mcg by mouth daily.     lidocaine-prilocaine (EMLA) cream Apply 1 application topically as needed (port access). 30 g 0   losartan-hydrochlorothiazide (HYZAAR) 100-12.5 MG tablet Take 1 tablet by mouth daily.   3   metoprolol succinate (TOPROL-XL) 100 MG 24 hr tablet Take 1 tablet (100 mg total) by mouth daily. 30 tablet 4   naproxen (NAPROSYN) 500 MG tablet TAKE 1 TABLET(500 MG) BY MOUTH TWICE DAILY WITH MEALS FOR BACK OR LEG PAIN     nicotine (NICODERM CQ - DOSED IN MG/24 HOURS) 14 mg/24hr patch Place 1 patch (14 mg total) onto the skin daily. 28 patch 0   nitroGLYCERIN (NITROSTAT) 0.4 MG SL tablet Place 1 tablet (0.4 mg total) under the tongue every 5 (five) minutes as needed for chest pain. 75 tablet 3   potassium chloride (K-DUR) 10 MEQ tablet TAKE 2 TABLETS(20 MEQ) BY MOUTH DAILY 60 tablet 0   PRALUENT 75 MG/ML SOAJ INJECT 75 MG INTO THE SKIN EVERY 14 DAYS 2 mL 0   No current facility-administered medications for this visit.     OBJECTIVE: Middle-aged African-American woman in no acute distress  There were no vitals filed for this visit.   There is no height or weight on file to calculate BMI.   Wt Readings from Last 3 Encounters:  11/21/19 172 lb (78 kg)  11/16/19 172 lb 9.6 oz (78.3 kg)  10/26/19 177 lb (80.3 kg)   ECOG FS:1   Sclerae unicteric, EOMs intact Wearing a mask No cervical or supraclavicular adenopathy Lungs no rales or rhonchi Heart regular rate and rhythm Abd soft, nontender, positive bowel sounds MSK no focal spinal tenderness, no upper extremity lymphedema Neuro: nonfocal, well oriented, appropriate affect Breasts:    {GENERAL: Patient is a well appearing female in no acute distress HEENT:  Sclerae anicteric.  Oropharynx clear and moist. No ulcerations or evidence of oropharyngeal candidiasis. Neck is supple.  NODES:  No cervical, supraclavicular, or axillary lymphadenopathy palpated.  BREAST EXAM:  Deferred  today LUNGS:  Clear to auscultation bilaterally.  No wheezes or rhonchi. HEART:  Regular rate and rhythm. No murmur appreciated. ABDOMEN:  Soft, nontender.  Positive, normoactive bowel sounds. No organomegaly palpated. MSK:  No focal spinal tenderness to palpation. Full range of motion bilaterally in the upper extremities. EXTREMITIES:  No peripheral edema.   SKIN:  Clear with no obvious rashes or skin changes. No nail dyscrasia. NEURO:  Nonfocal. Well oriented.  Appropriate affect.}   LAB RESULTS:  CMP     Component Value Date/Time   NA 142 11/16/2019 1305   NA 138 12/24/2017 0752   K 3.5 11/16/2019 1305   K 3.3 (L) 12/24/2017 0752   CL 104 11/16/2019 1305   CO2 26 11/16/2019 1305   CO2 25 12/24/2017 0752   GLUCOSE 91 11/16/2019 1305   GLUCOSE 99 12/24/2017 0752   BUN 11 11/16/2019 1305   BUN 7.9 12/24/2017 0752   CREATININE 0.84 11/16/2019 1305   CREATININE 0.7 12/24/2017 0752   CALCIUM 9.5 11/16/2019 1305   CALCIUM 9.2 12/24/2017 0752   PROT 7.1 11/16/2019 1305   PROT 6.3 (L) 12/24/2017 0752   ALBUMIN 3.3 (L) 11/16/2019 1305   ALBUMIN 3.4 (L) 12/24/2017 0752   AST 11 (L) 11/16/2019 1305   AST 15 12/24/2017 0752   ALT 12 11/16/2019 1305   ALT 18 12/24/2017 0752   ALKPHOS 124 11/16/2019 1305   ALKPHOS 93 12/24/2017 0752   BILITOT <0.2 (L) 11/16/2019 1305   BILITOT <0.22 12/24/2017 0752   GFRNONAA >60 11/16/2019 1305   GFRAA >60 11/16/2019 1305    No results found for: Ronnald Ramp, A1GS, A2GS, BETS, BETA2SER, GAMS, MSPIKE, SPEI  No results found for: Nils Pyle, [facility]  Lab Results  Component Value Date   WBC 5.0 11/16/2019   NEUTROABS 2.8 11/16/2019   HGB 11.1 (L) 11/16/2019   HCT 35.2 (L) 11/16/2019   MCV 88.0 11/16/2019   PLT 409 (H) 11/16/2019      Chemistry      Component Value Date/Time   NA 142 11/16/2019 1305   NA 138 12/24/2017 0752   K 3.5 11/16/2019 1305   K 3.3 (L) 12/24/2017 0752   CL 104 11/16/2019 1305    CO2 26 11/16/2019 1305   CO2 25 12/24/2017 0752   BUN 11 11/16/2019 1305   BUN 7.9 12/24/2017 0752   CREATININE 0.84 11/16/2019 1305   CREATININE 0.7 12/24/2017 0752      Component Value Date/Time   CALCIUM 9.5 11/16/2019 1305   CALCIUM 9.2 12/24/2017 0752   ALKPHOS 124 11/16/2019 1305   ALKPHOS 93 12/24/2017 0752   AST 11 (L) 11/16/2019 1305   AST 15 12/24/2017 0752   ALT 12 11/16/2019 1305   ALT 18 12/24/2017 0752   BILITOT <0.2 (L) 11/16/2019 1305   BILITOT <0.22 12/24/2017 0752       No results found for: LABCA2  No components found for: [id number]    No visits with results within 3 Day(s) from this visit.  Latest known visit with results is:  Hospital Outpatient Visit on 11/21/2019  Component Date Value Ref Range Status   Cholesterol 11/21/2019 122  0 - 200 mg/dL Final   Triglycerides 11/21/2019 57  <150 mg/dL Final   HDL 11/21/2019 49  >40 mg/dL Final   Total CHOL/HDL Ratio 11/21/2019 2.5  RATIO Final   VLDL 11/21/2019 11  0 - 40 mg/dL Final   LDL Cholesterol 11/21/2019 62  0 - 99 mg/dL Final   Comment:        Total Cholesterol/HDL:CHD Risk Coronary Heart Disease Risk Table                     Men   Women  1/2 Average Risk   3.4   3.3  Average Risk       5.0   4.4  2 X Average Risk   9.6   7.1  3 X Average Risk  23.4   11.0        Use the calculated Patient Ratio above and the CHD Risk Table to determine the patient's CHD Risk.        ATP III CLASSIFICATION (LDL):  <100     mg/dL   Optimal  100-129  mg/dL   Near or Above                    Optimal  130-159  mg/dL   Borderline  160-189  mg/dL   High  >190     mg/dL   Very High Performed at [facility] [address] [place],  [address]     (this displays the last labs from the last 3 days)  No results found for: TOTALPROTELP, ALBUMINELP, A1GS, A2GS, BETS, BETA2SER, GAMS, MSPIKE, SPEI (this displays SPEP labs)  No results found for: KPAFRELGTCHN, LAMBDASER,  KAPLAMBRATIO (kappa/lambda light chains)  No results found for: HGBA, HGBA2QUANT, HGBFQUANT, HGBSQUAN (Hemoglobinopathy evaluation)   No results found for: LDH  No results found for: IRON, TIBC, IRONPCTSAT (Iron and TIBC)  No results found for: FERRITIN  Urinalysis    Component Value Date/Time   LABSPEC 1.020 02/10/2011 2058   PHURINE 6.0 02/10/2011 2058   HGBUR LARGE (A) 02/10/2011 2058   BILIRUBINUR MODERATE (A) 02/10/2011 2058   KETONESUR 15 (A) 02/10/2011 2058   PROTEINUR >=300 (A) 02/10/2011 2058   UROBILINOGEN >=8.0 02/10/2011 2058   NITRITE NEGATIVE 02/10/2011 2058   LEUKOCYTESUR  02/10/2011 2058    NEGATIVE Biochemical Testing Only. Please order routine urinalysis from main lab if confirmatory testing is needed.     STUDIES: Ct Chest W Contrast  Result Date: 12/05/2019 CLINICAL DATA:  Breast cancer.  Rising tumor markers. EXAM: CT CHEST WITH CONTRAST TECHNIQUE: Multidetector CT imaging of the chest was performed during intravenous contrast administration. CONTRAST:  89m OMNIPAQUE IOHEXOL 300 MG/ML  SOLN COMPARISON:  04/07/2019 FINDINGS: Cardiovascular: Heart size upper normal to mildly increased. No pericardial effusion. Coronary artery calcification is evident. Atherosclerotic calcification is noted in the wall of the thoracic aorta. Right Port-A-Cath tip is positioned in the distal SVC. Mediastinum/Nodes: No mediastinal lymphadenopathy. There is no hilar lymphadenopathy. The esophagus has normal imaging features. There is no axillary lymphadenopathy. Lungs/Pleura: Centrilobular emphsyema noted. Subpleural reticulation anterior right lung likely fibrosis from radiation therapy. Multiple new bilateral pulmonary nodules are identified. *Lateral left upper lobe on 65/7. *5 mm posterior left upper lobe on 71/7. *4 mm right lower lobe on 89/7. *5 mm anterior right middle lobe on 90/7. *8 mm anterior right middle lobe on 91/7. No focal airspace consolidation. No pulmonary edema or  pleural effusion. Upper Abdomen: Interval development of multiple ill-defined hypoattenuating lesions in the liver, most prominently in the segment IV. Similar appearance low-density right adrenal nodule with low attenuation suggesting adenoma. Nodular thickening of the left adrenal gland is stable. Musculoskeletal: No worrisome lytic or sclerotic osseous abnormality. Status post thoracic vertebral body augmentation. IMPRESSION: 1. Interval development of tiny bilateral pulmonary nodules, highly concerning for metastatic disease. 2. Interval development of ill-defined hypoattenuating lesions in the liver parenchyma. These are poorly characterized on today's study, potentially due to bolus timing. MRI of the abdomen with and without contrast material recommended to further evaluate as metastatic disease is a distinct concern. 3. Stable nodular thickening  of the adrenal glands, most likely related to adenoma/hyperplasia. 4.  Aortic Atherosclerois (ICD10-170.0) 5.  Emphysema. ([id number]-B55.9) Electronically Signed   By: Misty Stanley M.D.   On: 12/05/2019 13:51   Nm Bone Scan Whole Body  Result Date: 12/05/2019 CLINICAL DATA:  Low back pain, history of breast cancer status post right lumpectomy with seed therapy and nodal resection EXAM: NUCLEAR MEDICINE WHOLE BODY BONE SCAN TECHNIQUE: Whole body anterior and posterior images were obtained approximately 3 hours after intravenous injection of radiopharmaceutical. RADIOPHARMACEUTICALS:  19.5 mCi Technetium-66mMDP IV COMPARISON:  Whole-body bone scan dated 04/07/2019. Concurrent CT chest dated 12/05/2019. FINDINGS: Mild uptake involving multiple mid vertebral bodies and L2. When correlating with concurrent CT chest, this is favored to be degenerative. No abnormal accumulation of radiotracer within the axillary or appendicular skeleton to suggest skeletal metastases. IMPRESSION: No abnormal accumulation of radiotracer within the axillary or appendicular skeleton to  suggest skeletal metastases. Electronically Signed   By: SJulian HyM.D.   On: 12/05/2019 19:30    ELIGIBLE FOR AVAILABLE RESEARCH PROTOCOL: no   ASSESSMENT: 68y.o. [place] woman status post right breast upper outer quadrant and right axillary lymph node biopsy 09/03/2017, both positive for invasive ductal carcinoma, grade 3, estrogen receptor positive, progesterone receptor negative, with an MIB-1 of 90%, the lymph node being HER-2 positive, the breast mass HER-2 negative  (a) staging studies September 24, 2017 show a lytic lesion in T8, possible areas of concern at L1-L2  (b) biopsy/kyphoplasty/osteocool of T8 lesion 11/02/2017 confirms metastatic adenocarcinoma, 20% estrogen receptor positive, estrogen receptor negative  (c) PET scan 11/30/2017 shows no visceral disease, minimal metabolic activity at T8, no other bone lesions  (1) neoadjuvant chemotherapy with carboplatin, docetaxel, trastuzumab, and pertuzumab started 10/01/2017, completed 6 cycles 01/26/2018  (a) Docetaxel dropped after three cycles and Gemcitabine/Carbo started on 12/15/2017.   (2) trastuzumab to continue indefinitely  (a) echocardiogram 11/38/2018 shows an ejection fraction in the 55-60% range  (b) echocardiogram 06/17/2019 shows an ejection fraction in the 50-55% range  (3) right lumpectomy and sentinel lymph node sampling 03/08/2018  Showed a pT1(mic) pN0 invasive ductal carcinoma, grade 2, with negative margins.  (4) adjuvant radiation  04-07-18 to 05-11-18                                              (a) SITE/DOSE:  Right Breast and Axilla, SCV nodes / 50 Gy in 25 fx               (b) radiation to her oligo metastatic disease at T8 (18 Gy) in 1 fraction, 04/21/2018  (5) anastrozole started 05/29/2018  (a) bone density test pending  (6) zolendronate started 01/19/2019, repeated every 12 weeks   PLAN:  PSilva Bandyis doing well today.  She continues on Anastrozole and Trastuzumab for her breast cancer  and is tolerating this well.  She will continue this.  She is also receiving Zoledronate every 12 weeks (last received 10/26/2019).    Phyllis and I reviewed the increase in her tumor markers.  Due to this increase, I recommended that we move up her scans, and for her to see Dr. MJana Hakimin 3 weeks with her next treatment.  She understands this.    She continues on tylenol/aleve if needed for her hip.    PSilva Bandywill undergo restaging on 12/7 and will see Dr. MJana Hakim  on 12/9.  She was recommended to continue with the appropriate pandemic precautions. She knows to call for any questions that may arise between now and her next appointment.  We are happy to see her sooner if needed.   [name]. Magrinat, MD  12/06/19 4:16 PM Medical Oncology and Hematology [facility] [place], [place] [address] Tel. [phone]    Fax. [phone]    I, Wilburn Mylar, am acting as scribe for Dr. [name]. Magrinat.  {Add scribe attestation statement}

## 2019-12-07 ENCOUNTER — Inpatient Hospital Stay (HOSPITAL_BASED_OUTPATIENT_CLINIC_OR_DEPARTMENT_OTHER): Payer: Medicare Other | Admitting: Oncology

## 2019-12-07 ENCOUNTER — Inpatient Hospital Stay: Payer: Medicare Other

## 2019-12-07 ENCOUNTER — Other Ambulatory Visit: Payer: Self-pay

## 2019-12-07 ENCOUNTER — Inpatient Hospital Stay: Payer: Medicare Other | Attending: Adult Health

## 2019-12-07 VITALS — BP 152/75 | HR 80 | Temp 98.5°F | Resp 17 | Ht 63.0 in | Wt 169.6 lb

## 2019-12-07 DIAGNOSIS — Z95828 Presence of other vascular implants and grafts: Secondary | ICD-10-CM

## 2019-12-07 DIAGNOSIS — C7800 Secondary malignant neoplasm of unspecified lung: Secondary | ICD-10-CM | POA: Insufficient documentation

## 2019-12-07 DIAGNOSIS — C7951 Secondary malignant neoplasm of bone: Secondary | ICD-10-CM | POA: Diagnosis not present

## 2019-12-07 DIAGNOSIS — Z79810 Long term (current) use of selective estrogen receptor modulators (SERMs): Secondary | ICD-10-CM | POA: Diagnosis not present

## 2019-12-07 DIAGNOSIS — Z170 Estrogen receptor positive status [ER+]: Secondary | ICD-10-CM | POA: Diagnosis not present

## 2019-12-07 DIAGNOSIS — Z923 Personal history of irradiation: Secondary | ICD-10-CM | POA: Diagnosis not present

## 2019-12-07 DIAGNOSIS — C773 Secondary and unspecified malignant neoplasm of axilla and upper limb lymph nodes: Secondary | ICD-10-CM | POA: Insufficient documentation

## 2019-12-07 DIAGNOSIS — C787 Secondary malignant neoplasm of liver and intrahepatic bile duct: Secondary | ICD-10-CM | POA: Insufficient documentation

## 2019-12-07 DIAGNOSIS — Z5112 Encounter for antineoplastic immunotherapy: Secondary | ICD-10-CM | POA: Diagnosis not present

## 2019-12-07 DIAGNOSIS — Z79899 Other long term (current) drug therapy: Secondary | ICD-10-CM | POA: Diagnosis not present

## 2019-12-07 DIAGNOSIS — C50411 Malignant neoplasm of upper-outer quadrant of right female breast: Secondary | ICD-10-CM | POA: Insufficient documentation

## 2019-12-07 LAB — CBC WITH DIFFERENTIAL (CANCER CENTER ONLY)
Abs Immature Granulocytes: 0.02 10*3/uL (ref 0.00–0.07)
Basophils Absolute: 0 10*3/uL (ref 0.0–0.1)
Basophils Relative: 0 %
Eosinophils Absolute: 0.3 10*3/uL (ref 0.0–0.5)
Eosinophils Relative: 5 %
HCT: 34.6 % — ABNORMAL LOW (ref 36.0–46.0)
Hemoglobin: 10.9 g/dL — ABNORMAL LOW (ref 12.0–15.0)
Immature Granulocytes: 0 %
Lymphocytes Relative: 17 %
Lymphs Abs: 0.9 10*3/uL (ref 0.7–4.0)
MCH: 26.5 pg (ref 26.0–34.0)
MCHC: 31.5 g/dL (ref 30.0–36.0)
MCV: 84 fL (ref 80.0–100.0)
Monocytes Absolute: 0.7 10*3/uL (ref 0.1–1.0)
Monocytes Relative: 13 %
Neutro Abs: 3.4 10*3/uL (ref 1.7–7.7)
Neutrophils Relative %: 65 %
Platelet Count: 438 10*3/uL — ABNORMAL HIGH (ref 150–400)
RBC: 4.12 MIL/uL (ref 3.87–5.11)
RDW: 17.3 % — ABNORMAL HIGH (ref 11.5–15.5)
WBC Count: 5.4 10*3/uL (ref 4.0–10.5)
nRBC: 0 % (ref 0.0–0.2)

## 2019-12-07 LAB — CMP (CANCER CENTER ONLY)
ALT: 11 U/L (ref 0–44)
AST: 13 U/L — ABNORMAL LOW (ref 15–41)
Albumin: 3.2 g/dL — ABNORMAL LOW (ref 3.5–5.0)
Alkaline Phosphatase: 156 U/L — ABNORMAL HIGH (ref 38–126)
Anion gap: 10 (ref 5–15)
BUN: 13 mg/dL (ref 8–23)
CO2: 27 mmol/L (ref 22–32)
Calcium: 9.3 mg/dL (ref 8.9–10.3)
Chloride: 104 mmol/L (ref 98–111)
Creatinine: 0.8 mg/dL (ref 0.44–1.00)
GFR, Est AFR Am: >60 mL/min (ref >60)
GFR, Estimated: >60 mL/min (ref >60)
Glucose, Bld: 98 mg/dL (ref 70–99)
Potassium: 4 mmol/L (ref 3.5–5.1)
Sodium: 141 mmol/L (ref 135–145)
Total Bilirubin: <0.2 mg/dL — ABNORMAL LOW (ref 0.3–1.2)
Total Protein: 7.3 g/dL (ref 6.5–8.1)

## 2019-12-07 MED ORDER — SODIUM CHLORIDE 0.9% FLUSH
10.0000 mL | Freq: Once | INTRAVENOUS | Status: AC
  Administered 2019-12-07: 13:00:00 10 mL
  Filled 2019-12-07: qty 10

## 2019-12-07 MED ORDER — SODIUM CHLORIDE 0.9% FLUSH
10.0000 mL | Freq: Once | INTRAVENOUS | Status: AC
  Administered 2019-12-07: 10 mL
  Filled 2019-12-07: qty 10

## 2019-12-07 MED ORDER — HEPARIN SOD (PORK) LOCK FLUSH 100 UNIT/ML IV SOLN
500.0000 [IU] | Freq: Once | INTRAVENOUS | Status: AC
  Administered 2019-12-07: 500 [IU]
  Filled 2019-12-07: qty 5

## 2019-12-07 NOTE — Progress Notes (Signed)
[place]  [phone] [phone]     ID: Tracie [name] DOB: 25-Sep-1951  MR#: [id number]  JSH#:[id number]  Patient Care Team: Bartholome Bill, MD as PCP - General (Family Medicine) [name], MD as Consulting Physician (General Surgery) Sonjia Wilcoxson, [name], MD as Consulting Physician (Oncology) [name], MD as Attending Physician (Radiation Oncology) Mottinger, Falkner DDS (Physical Therapy) [name], MD as Consulting Physician (Cardiology) [name], [name], NP as Nurse Practitioner (Hematology and Oncology) OTHER MD:   CHIEF COMPLAINT: Triple positive breast cancer  CURRENT TREATMENT: trastuzumab; anastrozole, zoledronate   INTERVAL HISTORY: Tracie [name] returns today for follow-up and treatment of her triple positive breast cancer.  She has been treated with anastrozole and trastuzumab with her trastuzumab dose due today.  However her tumor marker has been rising: Results for AYEISHA, LINDENBERGER (MRN [id number]) as of 12/07/2019 13:19  Ref. Range 11/23/2018 12:04 02/15/2019 08:20 05/11/2019 13:30 10/26/2019 13:56 11/16/2019 13:05  CA 27.29 Latest Ref Range: 0.0 - 38.6 U/mL 25.4 29.7 39.2 (H) 95.5 (H) 154.4 (H)   Accordingly she underwent chest CT on 12/05/2019, which showed: interval development of tiny bilateral pulmonary nodules, highly concerning for metastatic disease; interval development of ill-defined hypoattenuating lesions in the liver parenchyma, poorly characterized on today's study; stable nodular thickening of adrenal glands, most likely related to adenoma/hyperplasia.  Bone scan performed the same day showed no abnormal accumulation of radiotracer within the axillary or appendicular skeleton to suggest skeletal metastase She receives zoledronate every 12 weeks, with her most recent dose 10/26/2019  She underwent repeat echocardiogram on 11/21/2019, which showed an ejection fraction of 50%.  This was  reviewed by Dr. Aundra Dubin.  He does not think the echo changes are due to Herceptin but likely to ischemia related to the circumflex.  He is managing this medically with losartan Toprol and suggested he can we continue Herceptin with repeat echo in 4 weeks  REVIEW OF SYSTEMS: Tracie [name] is doing well since retirement.  She has been doing a lot of decorating in the house, reading, and enjoying her grandchildren she has 3 in town (ages 15 and 43).  She feels that they are "safe" as far as the coronavirus is concerned.  She has had no altered taste, no loss of appetite, no nausea or vomiting.  She has had no cough phlegm production or pleurisy no shortness of breath.  Detailed review of systems today was otherwise stable.   HISTORY OF CURRENT ILLNESS: From the original intake note:  Tracie [name] noted a change in her right breast sometime in July 2018. [facility] to report that she had found a "kernel" in her breast. She was advised to see her primary physician which she did. The patient was then set up for bilateral diagnostic mammography with tomography and right breast ultrasonography at [facility] 09/03/2017. The breast density was category I a. In the upper outer quadrant of the right breast there was a 2.1 cm high density mass, which was palpable. Also in the right breast more posteriorly there was a 1.5 cm high density mass which was felt to be a suspicious lymph node. Ultrasound of the right breast confirmed a 2.1 centimeter lobulated solid mass in the right breast upper outer quadrant 15.8 cm from the nipple in the 10:00 radiant. There was a 1.5 cm oval mass in the right axillary tail with a small rounded masses also suspicious for lymph node involvement. A total of 3 suspicious lymph nodes were  identified.  On 09/03/2017 the patient underwent biopsy of the breast mass and the suspicious right axillary lymph node. The final pathology ([id number]) found invasive ductal carcinoma, grade 3, in  both. Both tumors were estrogen receptor positive at 70-75%, and both were progesterone receptor negative. Both had an elevated proliferation marker at 90%. The mass in the breast was HER-2 negative, with a signals ratio of 1.25 and the number per cell 1.88. The lymph node mass however was HER-2 positive with a signals ratio of 2.57, and the number per cell 3.60.  The patient's subsequent history is as detailed below.   PAST MEDICAL HISTORY: Past Medical History:  Diagnosis Date   Angina pectoris ([place])    history of    Arthritis    Asthma    Cancer ([place])    right breast   Constipation    Diverticulitis    History of radiation therapy 04/07/18- 05/11/18   50 Gy in 25 fractions to right breast and regional nodes with four fields (no boost)   History of radiation therapy 04/21/2018   T8 spine, 18 Gy in 1 fraction for a total dose of 18 Gy.    HPV in female    Hyperlipidemia    Hypertension    Hypothyroidism    Wears dentures    Wears glasses     PAST SURGICAL HISTORY: Past Surgical History:  Procedure Laterality Date   BREAST LUMPECTOMY WITH RADIOACTIVE SEED AND SENTINEL LYMPH NODE BIOPSY Right 03/08/2018   Procedure: RIGHT BREAST RADIOACTIVE SEED GUIDED LUMPECTOMY WITH RIGHT AXILLARY RADIOACTIVE SEED TARGETED LYMPH NODE EXCISION AND SENTINEL LYMPH NODE BIOPSY, INJECT BLUE DYE RIGHT BREAST;  Surgeon: [name], MD;  Location: [facility];  Service: General;  Laterality: Right;   CESAREAN SECTION     COLON SURGERY  2012   sigmoidectomy   COLOSTOMY     COLOSTOMY TAKEDOWN  10/06/11   IR BONE TUMOR(S)RF ABLATION  11/02/2017   IR KYPHO THORACIC WITH BONE BIOPSY  11/02/2017   IR RADIOLOGIST EVAL & MGMT  10/07/2017   IR RADIOLOGIST EVAL & MGMT  11/25/2017   PORTACATH PLACEMENT N/A 09/25/2017   Procedure: INSERTION PORT-A-CATH WITH ULTRA SOUND ERAS PATHWAY;  Surgeon: [name], MD;  Location: [place];  Service: General;  Laterality: N/A;   TONSILLECTOMY      TUBAL LIGATION      FAMILY HISTORY: Family History  Problem Relation Age of Onset   Cancer Mother        rectal   Hypertension Brother    Hypertension Brother    Asthma Son    Asthma Brother    The patient has little information regarding her father. Her mother died at age 34 from a strange cancer--the patient does not know what it was, it may well have been cervical cancer from her description. The patient has one brother, no sisters. There is no history of breast or ovarian cancer in the family to the patient's knowledge    GYNECOLOGIC HISTORY:  No LMP recorded. Patient is postmenopausal.  menarche age 59, first live birth age 39 the patient is Tracie [name] P3. She stopped having periods at age 28. She never used oral contraceptives or hormone replacement.    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  She is divorced.  Her son [name] stays with her sometimes. He is a Art gallery manager and therefore not working during the pandemic. The 2 other children are Marrianne Mood  Wescott, who lives in [place] and works in the theater and media, and Tylene Quashie lives in [place], and is Engineer, maintenance of a [employer]. The patient has 5 grandchildren. She is a Psychologist, forensic.   ADVANCED DIRECTIVES: Not in place    HEALTH MAINTENANCE: Social History   Tobacco Use   Smoking status: Current Some Day Smoker    Packs/day: 0.25    Years: 40.00    Pack years: 10.00    Types: Cigarettes   Smokeless tobacco: Never Used   Tobacco comment: she is smoking 2 or more daily, she plans to use nicotine patch.   Substance Use Topics   Alcohol use: Yes    Comment: occasional   Drug use: No    Colonoscopy: September 2012   PAP:  Bone density:09/03/2017    Allergies  Allergen Reactions   Fish Allergy Anaphylaxis and Swelling    Swelling of hands and feet., non shellfish allergy   Other Anaphylaxis    Tree nuts    Ace  Inhibitors Other (See Comments) and Cough    Cold like symptoms    Tylenol [Acetaminophen] Other (See Comments)    Headache     Current Outpatient Medications  Medication Sig Dispense Refill   ADVAIR DISKUS 250-50 MCG/DOSE AEPB INL 1 PUFF ITL Q 12 H     albuterol (PROAIR HFA) 108 (90 Base) MCG/ACT inhaler INHALE 2 PUFFS BY MOUTH INTO THE LUNGS EVERY 6 HOURS AS NEEDED FOR WHEEZING     Alirocumab (PRALUENT) 75 MG/ML SOPN Inject 75 mg into the skin every 14 (fourteen) days. 2 pen 11   amLODipine (NORVASC) 10 MG tablet Take 10 mg by mouth daily.      anastrozole (ARIMIDEX) 1 MG tablet TAKE 1 TABLET(1 MG) BY MOUTH DAILY 90 tablet 4   aspirin EC 81 MG tablet Take 1 tablet (81 mg total) by mouth daily. 90 tablet 3   cetirizine (ZYRTEC) 10 MG tablet Take 10 mg by mouth daily as needed for allergies.   11   levothyroxine (SYNTHROID, LEVOTHROID) 50 MCG tablet Take 50 mcg by mouth daily.     lidocaine-prilocaine (EMLA) cream Apply 1 application topically as needed (port access). 30 g 0   losartan-hydrochlorothiazide (HYZAAR) 100-12.5 MG tablet Take 1 tablet by mouth daily.   3   metoprolol succinate (TOPROL-XL) 100 MG 24 hr tablet Take 1 tablet (100 mg total) by mouth daily. 30 tablet 4   naproxen (NAPROSYN) 500 MG tablet TAKE 1 TABLET(500 MG) BY MOUTH TWICE DAILY WITH MEALS FOR BACK OR LEG PAIN     nicotine (NICODERM CQ - DOSED IN MG/24 HOURS) 14 mg/24hr patch Place 1 patch (14 mg total) onto the skin daily. 28 patch 0   nitroGLYCERIN (NITROSTAT) 0.4 MG SL tablet Place 1 tablet (0.4 mg total) under the tongue every 5 (five) minutes as needed for chest pain. 75 tablet 3   potassium chloride (K-DUR) 10 MEQ tablet TAKE 2 TABLETS(20 MEQ) BY MOUTH DAILY 60 tablet 0   PRALUENT 75 MG/ML SOAJ INJECT 75 MG INTO THE SKIN EVERY 14 DAYS 2 mL 0   No current facility-administered medications for this visit.     OBJECTIVE: Middle-aged African-American woman in no acute distress  Vitals:    12/07/19 1315  BP: (!) 152/75  Pulse: 80  Resp: 17  Temp: 98.5 F (36.9 C)  SpO2: 98%     Body mass index is 30.04 kg/m.   Wt Readings from Last 3 Encounters:  12/07/19 169 lb 9.6 oz (76.9 kg)  11/21/19 172 lb (78 kg)  11/16/19 172 lb 9.6 oz (78.3 kg)   ECOG FS:1   Sclerae unicteric, EOMs intact Wearing a mask No cervical or supraclavicular adenopathy Lungs no rales or rhonchi Heart regular rate and rhythm Abd soft, nontender, positive bowel sounds MSK no focal spinal tenderness, no upper extremity lymphedema Neuro: nonfocal, well oriented, appropriate affect Breasts: Deferred   LAB RESULTS:  CMP     Component Value Date/Time   NA 141 12/07/2019 1237   NA 138 12/24/2017 0752   K 4.0 12/07/2019 1237   K 3.3 (L) 12/24/2017 0752   CL 104 12/07/2019 1237   CO2 27 12/07/2019 1237   CO2 25 12/24/2017 0752   GLUCOSE 98 12/07/2019 1237   GLUCOSE 99 12/24/2017 0752   BUN 13 12/07/2019 1237   BUN 7.9 12/24/2017 0752   CREATININE 0.80 12/07/2019 1237   CREATININE 0.7 12/24/2017 0752   CALCIUM 9.3 12/07/2019 1237   CALCIUM 9.2 12/24/2017 0752   PROT 7.3 12/07/2019 1237   PROT 6.3 (L) 12/24/2017 0752   ALBUMIN 3.2 (L) 12/07/2019 1237   ALBUMIN 3.4 (L) 12/24/2017 0752   AST 13 (L) 12/07/2019 1237   AST 15 12/24/2017 0752   ALT 11 12/07/2019 1237   ALT 18 12/24/2017 0752   ALKPHOS 156 (H) 12/07/2019 1237   ALKPHOS 93 12/24/2017 0752   BILITOT <0.2 (L) 12/07/2019 1237   BILITOT <0.22 12/24/2017 0752   GFRNONAA >60 12/07/2019 1237   GFRAA >60 12/07/2019 1237    No results found for: Ronnald Ramp, A1GS, A2GS, BETS, BETA2SER, GAMS, MSPIKE, SPEI  No results found for: Nils Pyle, [facility]  Lab Results  Component Value Date   WBC 5.4 12/07/2019   NEUTROABS 3.4 12/07/2019   HGB 10.9 (L) 12/07/2019   HCT 34.6 (L) 12/07/2019   MCV 84.0 12/07/2019   PLT 438 (H) 12/07/2019      Chemistry      Component Value Date/Time   NA 141  12/07/2019 1237   NA 138 12/24/2017 0752   K 4.0 12/07/2019 1237   K 3.3 (L) 12/24/2017 0752   CL 104 12/07/2019 1237   CO2 27 12/07/2019 1237   CO2 25 12/24/2017 0752   BUN 13 12/07/2019 1237   BUN 7.9 12/24/2017 0752   CREATININE 0.80 12/07/2019 1237   CREATININE 0.7 12/24/2017 0752      Component Value Date/Time   CALCIUM 9.3 12/07/2019 1237   CALCIUM 9.2 12/24/2017 0752   ALKPHOS 156 (H) 12/07/2019 1237   ALKPHOS 93 12/24/2017 0752   AST 13 (L) 12/07/2019 1237   AST 15 12/24/2017 0752   ALT 11 12/07/2019 1237   ALT 18 12/24/2017 0752   BILITOT <0.2 (L) 12/07/2019 1237   BILITOT <0.22 12/24/2017 0752       No results found for: LABCA2  No components found for: [id number]    Appointment on 12/07/2019  Component Date Value Ref Range Status   Sodium 12/07/2019 141  135 - 145 mmol/L Final   Potassium 12/07/2019 4.0  3.5 - 5.1 mmol/L Final   Chloride 12/07/2019 104  98 - 111 mmol/L Final   CO2 12/07/2019 27  22 - 32 mmol/L Final   Glucose, Bld 12/07/2019 98  70 - 99 mg/dL Final   BUN 12/07/2019 13  8 - 23 mg/dL Final   Creatinine 12/07/2019 0.80  0.44 - 1.00 mg/dL Final   Calcium 12/07/2019 9.3  8.9 - 10.3  mg/dL Final   Total Protein 12/07/2019 7.3  6.5 - 8.1 g/dL Final   Albumin 12/07/2019 3.2* 3.5 - 5.0 g/dL Final   AST 12/07/2019 13* 15 - 41 U/L Final   ALT 12/07/2019 11  0 - 44 U/L Final   Alkaline Phosphatase 12/07/2019 156* 38 - 126 U/L Final   Total Bilirubin 12/07/2019 <0.2* 0.3 - 1.2 mg/dL Final   GFR, Est Non Af Am 12/07/2019 >60  >60 mL/min Final   GFR, Est AFR Am 12/07/2019 >60  >60 mL/min Final   Anion gap 12/07/2019 10  5 - 15 Final   Performed at [facility], [place] [address] [place], [place] [address]   WBC Count 12/07/2019 5.4  4.0 - 10.5 K/uL Final   RBC 12/07/2019 4.12  3.87 - 5.11 MIL/uL Final   Hemoglobin 12/07/2019 10.9* 12.0 - 15.0 g/dL Final   HCT 12/07/2019 34.6* 36.0 - 46.0 % Final   MCV  12/07/2019 84.0  80.0 - 100.0 fL Final   MCH 12/07/2019 26.5  26.0 - 34.0 pg Final   MCHC 12/07/2019 31.5  30.0 - 36.0 g/dL Final   RDW 12/07/2019 17.3* 11.5 - 15.5 % Final   Platelet Count 12/07/2019 438* 150 - 400 K/uL Final   nRBC 12/07/2019 0.0  0.0 - 0.2 % Final   Neutrophils Relative % 12/07/2019 65  % Final   Neutro Abs 12/07/2019 3.4  1.7 - 7.7 K/uL Final   Lymphocytes Relative 12/07/2019 17  % Final   Lymphs Abs 12/07/2019 0.9  0.7 - 4.0 K/uL Final   Monocytes Relative 12/07/2019 13  % Final   Monocytes Absolute 12/07/2019 0.7  0.1 - 1.0 K/uL Final   Eosinophils Relative 12/07/2019 5  % Final   Eosinophils Absolute 12/07/2019 0.3  0.0 - 0.5 K/uL Final   Basophils Relative 12/07/2019 0  % Final   Basophils Absolute 12/07/2019 0.0  0.0 - 0.1 K/uL Final   Immature Granulocytes 12/07/2019 0  % Final   Abs Immature Granulocytes 12/07/2019 0.02  0.00 - 0.07 K/uL Final   Performed at [facility], [address] [place], [place] [address]    (this displays the last labs from the last 3 days)  No results found for: TOTALPROTELP, ALBUMINELP, A1GS, A2GS, BETS, BETA2SER, GAMS, MSPIKE, SPEI (this displays SPEP labs)  No results found for: KPAFRELGTCHN, LAMBDASER, KAPLAMBRATIO (kappa/lambda light chains)  No results found for: HGBA, HGBA2QUANT, HGBFQUANT, HGBSQUAN (Hemoglobinopathy evaluation)   No results found for: LDH  No results found for: IRON, TIBC, IRONPCTSAT (Iron and TIBC)  No results found for: FERRITIN  Urinalysis    Component Value Date/Time   LABSPEC 1.020 02/10/2011 2058   PHURINE 6.0 02/10/2011 2058   HGBUR LARGE (A) 02/10/2011 2058   BILIRUBINUR MODERATE (A) 02/10/2011 2058   KETONESUR 15 (A) 02/10/2011 2058   PROTEINUR >=300 (A) 02/10/2011 2058   UROBILINOGEN >=8.0 02/10/2011 2058   NITRITE NEGATIVE 02/10/2011 2058   LEUKOCYTESUR  02/10/2011 2058    NEGATIVE Biochemical Testing Only. Please order routine  urinalysis from main lab if confirmatory testing is needed.     STUDIES: Ct Chest W Contrast  Result Date: 12/05/2019 CLINICAL DATA:  Breast cancer.  Rising tumor markers. EXAM: CT CHEST WITH CONTRAST TECHNIQUE: Multidetector CT imaging of the chest was performed during intravenous contrast administration. CONTRAST:  42m OMNIPAQUE IOHEXOL 300 MG/ML  SOLN COMPARISON:  04/07/2019 FINDINGS: Cardiovascular: Heart size upper normal to mildly increased. No pericardial effusion. Coronary  artery calcification is evident. Atherosclerotic calcification is noted in the wall of the thoracic aorta. Right Port-A-Cath tip is positioned in the distal SVC. Mediastinum/Nodes: No mediastinal lymphadenopathy. There is no hilar lymphadenopathy. The esophagus has normal imaging features. There is no axillary lymphadenopathy. Lungs/Pleura: Centrilobular emphsyema noted. Subpleural reticulation anterior right lung likely fibrosis from radiation therapy. Multiple new bilateral pulmonary nodules are identified. *Lateral left upper lobe on 65/7. *5 mm posterior left upper lobe on 71/7. *4 mm right lower lobe on 89/7. *5 mm anterior right middle lobe on 90/7. *8 mm anterior right middle lobe on 91/7. No focal airspace consolidation. No pulmonary edema or pleural effusion. Upper Abdomen: Interval development of multiple ill-defined hypoattenuating lesions in the liver, most prominently in the segment IV. Similar appearance low-density right adrenal nodule with low attenuation suggesting adenoma. Nodular thickening of the left adrenal gland is stable. Musculoskeletal: No worrisome lytic or sclerotic osseous abnormality. Status post thoracic vertebral body augmentation. IMPRESSION: 1. Interval development of tiny bilateral pulmonary nodules, highly concerning for metastatic disease. 2. Interval development of ill-defined hypoattenuating lesions in the liver parenchyma. These are poorly characterized on today's study, potentially due to  bolus timing. MRI of the abdomen with and without contrast material recommended to further evaluate as metastatic disease is a distinct concern. 3. Stable nodular thickening of the adrenal glands, most likely related to adenoma/hyperplasia. 4.  Aortic Atherosclerois (ICD10-170.0) 5.  Emphysema. (RAQ76-A26.9) Electronically Signed   By: Misty Stanley M.D.   On: 12/05/2019 13:51   Nm Bone Scan Whole Body  Result Date: 12/05/2019 CLINICAL DATA:  Low back pain, history of breast cancer status post right lumpectomy with seed therapy and nodal resection EXAM: NUCLEAR MEDICINE WHOLE BODY BONE SCAN TECHNIQUE: Whole body anterior and posterior images were obtained approximately 3 hours after intravenous injection of radiopharmaceutical. RADIOPHARMACEUTICALS:  19.5 mCi Technetium-38mMDP IV COMPARISON:  Whole-body bone scan dated 04/07/2019. Concurrent CT chest dated 12/05/2019. FINDINGS: Mild uptake involving multiple mid vertebral bodies and L2. When correlating with concurrent CT chest, this is favored to be degenerative. No abnormal accumulation of radiotracer within the axillary or appendicular skeleton to suggest skeletal metastases. IMPRESSION: No abnormal accumulation of radiotracer within the axillary or appendicular skeleton to suggest skeletal metastases. Electronically Signed   By: SJulian HyM.D.   On: 12/05/2019 19:30    ELIGIBLE FOR AVAILABLE RESEARCH PROTOCOL: no   ASSESSMENT: 68y.o. Tracie [name] woman status post right breast upper outer quadrant and right axillary lymph node biopsy 09/03/2017, both positive for invasive ductal carcinoma, grade 3, estrogen receptor positive, progesterone receptor negative, with an MIB-1 of 90%, the lymph node being HER-2 positive, the breast mass HER-2 negative  (a) staging studies September 24, 2017 show a lytic lesion in T8, possible areas of concern at L1-L2  (b) biopsy/kyphoplasty/osteocool of T8 lesion 11/02/2017 confirms metastatic adenocarcinoma, 20%  estrogen receptor positive, progesterone receptor negative  (c) PET scan 11/30/2017 shows no visceral disease, minimal metabolic activity at T8, no other bone lesions  (d) CT of the chest 12/05/2019 shows new lung and liver lesions; bone scan was negative  (1) neoadjuvant chemotherapy with carboplatin, docetaxel, trastuzumab, and pertuzumab started 10/01/2017, completed 6 cycles 01/26/2018  (a) Docetaxel dropped after three cycles and Gemcitabine/Carbo started on 12/15/2017.   (2) trastuzumab to continue indefinitely  (a) echocardiogram 11/38/2018 shows an ejection fraction in the 55-60% range  (b) echocardiogram 06/17/2019 shows an ejection fraction in the 50-55% range  (c) echocardiogram 11/21/2019 shows an ejection fraction in the  50% range, which is mildly decreased.  (d) trastuzumab discontinued after 01/15/2019 dose with disease progression  (3) right lumpectomy and sentinel lymph node sampling 03/08/2018  Showed a pT1(mic) pN0 invasive ductal carcinoma, grade 2, with negative margins.  (4) adjuvant radiation  04-07-18 to 05-11-18                                              (a) SITE/DOSE:  Right Breast and Axilla, SCV nodes / 50 Gy in 25 fx               (b) radiation to her oligo metastatic disease at T8 (18 Gy) in 1 fraction, 04/21/2018  (5) anastrozole started 05/29/2018  (a) bone density 07/19/2019 shows a T score of -0.9, which is normal.  (6) zolendronate started 01/19/2019, repeated every 12 weeks   (7) to start T-DM1 12/14/2019  PLAN:  Phyllis's breast cancer appears to be progressing.  It now involves viscera, whereas before we only had bone lesions.  We do need to confirm this and I am setting her up for liver biopsy.  We will also obtain a liver MRI so we can get measurable disease there.  We need to make sure the cancer it continues to be HER-2 positive and estrogen receptor positive.  In anticipation of that I am setting her up for T-DM1 to start next week.  Note  that she recently was evaluated by cardiology and she very likely has some ischemic cardiomyopathy.  Her echo however does allow [place] to continue to treat and she will be closely followed.  She has a good understanding of the possible toxicities side effects and complications of T-DM1.  Once we have the biopsy on hand we can also consider switching her from anastrozole to tamoxifen or other antiestrogen.  Tracie [name] was appropriately concerned regarding this news.  I did give her a copy of the scan so she can look at it more closely on her own time.  I plan to see her again before her second T-DM1 treatment to ascertain tolerance and also to discuss the results of the MRI and biopsy once these are available.   [name]. Maxwell Martorano, MD  12/07/19 2:10 PM Medical Oncology and Hematology [facility] [place], [place] [address] Tel. [phone]    Fax. [phone]    I, Wilburn Mylar, am acting as scribe for Dr. [name]. Mikea Quadros.  I, Lurline Del MD, have reviewed the above documentation for accuracy and completeness, and I agree with the above.

## 2019-12-08 ENCOUNTER — Encounter (HOSPITAL_COMMUNITY): Payer: Self-pay

## 2019-12-08 ENCOUNTER — Telehealth: Payer: Self-pay | Admitting: Oncology

## 2019-12-08 LAB — CANCER ANTIGEN 27.29: CA 27.29: 277.1 U/mL — ABNORMAL HIGH (ref 0.0–38.6)

## 2019-12-08 NOTE — Progress Notes (Signed)
P[facility] 1  Tracie [name] Female, 68 y.o., 22-Jul-1951 MRN:  [id number] Phone:  [phone] Jerilynn Mages) PCP:  Bartholome Bill, MD Coverage:  [place] With Radiology ([facility]) 12/12/2019 at 9:00 AM  RE: BIOPSY Received: Today Message Contents  Tracie [place], MD  Tracie [name]   [place] core liver lesion   request breast prognostic panel   [facility]   Previous Messages  ----- Message -----  From: Tracie [name]  Sent: 12/08/2019  3:17 PM EST  To: [facility] Procedure Requests  Subject: BIOPSY                      Procedure Requested: US BIOPSY (LIVER)    Reason for Procedure: Malignant neoplasm of upper-outer quadrant of right breast in female, estrogen receptor positive ([place]) Bone metastasis ([place]) Port-A-Cath in place  please request breast prognostic panel   Provider Requesting:  Chauncey Cruel, MD  Provider Telephone:  [phone]    Other Info:

## 2019-12-08 NOTE — Telephone Encounter (Signed)
Scheduled appt per 1/29 sch message - unable to reach pt . Left message with appt date and time   

## 2019-12-09 ENCOUNTER — Other Ambulatory Visit: Payer: Self-pay | Admitting: Oncology

## 2019-12-12 ENCOUNTER — Ambulatory Visit (HOSPITAL_COMMUNITY)
Admission: RE | Admit: 2019-12-12 | Discharge: 2019-12-12 | Disposition: A | Discharge status: Home / Self Care | Payer: Medicare Other | Source: Ambulatory Visit | Attending: Oncology | Admitting: Oncology

## 2019-12-12 ENCOUNTER — Ambulatory Visit (HOSPITAL_COMMUNITY): Payer: Medicare Other

## 2019-12-12 ENCOUNTER — Other Ambulatory Visit (HOSPITAL_COMMUNITY): Payer: Medicare Other

## 2019-12-12 ENCOUNTER — Other Ambulatory Visit: Payer: Self-pay

## 2019-12-12 DIAGNOSIS — Z95828 Presence of other vascular implants and grafts: Secondary | ICD-10-CM | POA: Insufficient documentation

## 2019-12-12 DIAGNOSIS — Z170 Estrogen receptor positive status [ER+]: Secondary | ICD-10-CM | POA: Insufficient documentation

## 2019-12-12 DIAGNOSIS — C7951 Secondary malignant neoplasm of bone: Secondary | ICD-10-CM | POA: Diagnosis present

## 2019-12-12 DIAGNOSIS — C50411 Malignant neoplasm of upper-outer quadrant of right female breast: Secondary | ICD-10-CM | POA: Insufficient documentation

## 2019-12-12 MED ORDER — HEPARIN SOD (PORK) LOCK FLUSH 100 UNIT/ML IV SOLN
500.0000 [IU] | INTRAVENOUS | Status: AC | PRN
  Administered 2019-12-12: 500 [IU]
  Filled 2019-12-12: qty 5

## 2019-12-12 MED ORDER — GADOBUTROL 1 MMOL/ML IV SOLN
7.0000 mL | Freq: Once | INTRAVENOUS | Status: AC | PRN
  Administered 2019-12-12: 7 mL via INTRAVENOUS

## 2019-12-13 ENCOUNTER — Other Ambulatory Visit: Payer: Self-pay | Admitting: Physician Assistant

## 2019-12-14 ENCOUNTER — Ambulatory Visit (HOSPITAL_COMMUNITY)
Admission: RE | Admit: 2019-12-14 | Discharge: 2019-12-14 | Disposition: A | Discharge status: Home / Self Care | Payer: Medicare Other | Source: Ambulatory Visit | Attending: Oncology | Admitting: Oncology

## 2019-12-14 ENCOUNTER — Other Ambulatory Visit: Payer: Self-pay

## 2019-12-14 ENCOUNTER — Encounter (HOSPITAL_COMMUNITY): Payer: Self-pay

## 2019-12-14 DIAGNOSIS — J45909 Unspecified asthma, uncomplicated: Secondary | ICD-10-CM | POA: Insufficient documentation

## 2019-12-14 DIAGNOSIS — Z91013 Allergy to seafood: Secondary | ICD-10-CM | POA: Insufficient documentation

## 2019-12-14 DIAGNOSIS — E785 Hyperlipidemia, unspecified: Secondary | ICD-10-CM | POA: Diagnosis not present

## 2019-12-14 DIAGNOSIS — I10 Essential (primary) hypertension: Secondary | ICD-10-CM | POA: Diagnosis not present

## 2019-12-14 DIAGNOSIS — Z79890 Hormone replacement therapy (postmenopausal): Secondary | ICD-10-CM | POA: Diagnosis not present

## 2019-12-14 DIAGNOSIS — Z888 Allergy status to other drugs, medicaments and biological substances status: Secondary | ICD-10-CM | POA: Insufficient documentation

## 2019-12-14 DIAGNOSIS — K769 Liver disease, unspecified: Secondary | ICD-10-CM | POA: Diagnosis present

## 2019-12-14 DIAGNOSIS — Z825 Family history of asthma and other chronic lower respiratory diseases: Secondary | ICD-10-CM | POA: Diagnosis not present

## 2019-12-14 DIAGNOSIS — Z8249 Family history of ischemic heart disease and other diseases of the circulatory system: Secondary | ICD-10-CM | POA: Insufficient documentation

## 2019-12-14 DIAGNOSIS — Z7951 Long term (current) use of inhaled steroids: Secondary | ICD-10-CM | POA: Diagnosis not present

## 2019-12-14 DIAGNOSIS — Z79811 Long term (current) use of aromatase inhibitors: Secondary | ICD-10-CM | POA: Insufficient documentation

## 2019-12-14 DIAGNOSIS — Z79899 Other long term (current) drug therapy: Secondary | ICD-10-CM | POA: Insufficient documentation

## 2019-12-14 DIAGNOSIS — E039 Hypothyroidism, unspecified: Secondary | ICD-10-CM | POA: Diagnosis not present

## 2019-12-14 DIAGNOSIS — Z170 Estrogen receptor positive status [ER+]: Secondary | ICD-10-CM | POA: Insufficient documentation

## 2019-12-14 DIAGNOSIS — Z7982 Long term (current) use of aspirin: Secondary | ICD-10-CM | POA: Diagnosis not present

## 2019-12-14 DIAGNOSIS — Z923 Personal history of irradiation: Secondary | ICD-10-CM | POA: Insufficient documentation

## 2019-12-14 DIAGNOSIS — C787 Secondary malignant neoplasm of liver and intrahepatic bile duct: Secondary | ICD-10-CM | POA: Insufficient documentation

## 2019-12-14 DIAGNOSIS — C7951 Secondary malignant neoplasm of bone: Secondary | ICD-10-CM | POA: Diagnosis not present

## 2019-12-14 DIAGNOSIS — Z95828 Presence of other vascular implants and grafts: Secondary | ICD-10-CM

## 2019-12-14 DIAGNOSIS — C50411 Malignant neoplasm of upper-outer quadrant of right female breast: Secondary | ICD-10-CM | POA: Diagnosis not present

## 2019-12-14 DIAGNOSIS — M1990 Unspecified osteoarthritis, unspecified site: Secondary | ICD-10-CM | POA: Diagnosis not present

## 2019-12-14 DIAGNOSIS — F17210 Nicotine dependence, cigarettes, uncomplicated: Secondary | ICD-10-CM | POA: Diagnosis not present

## 2019-12-14 DIAGNOSIS — Z800 Family history of malignant neoplasm of digestive organs: Secondary | ICD-10-CM | POA: Insufficient documentation

## 2019-12-14 LAB — CBC
HCT: 33.3 % — ABNORMAL LOW (ref 36.0–46.0)
Hemoglobin: 10.3 g/dL — ABNORMAL LOW (ref 12.0–15.0)
MCH: 26.6 pg (ref 26.0–34.0)
MCHC: 30.9 g/dL (ref 30.0–36.0)
MCV: 86 fL (ref 80.0–100.0)
Platelets: 450 10*3/uL — ABNORMAL HIGH (ref 150–400)
RBC: 3.87 MIL/uL (ref 3.87–5.11)
RDW: 18.1 % — ABNORMAL HIGH (ref 11.5–15.5)
WBC: 4.9 10*3/uL (ref 4.0–10.5)
nRBC: 0 % (ref 0.0–0.2)

## 2019-12-14 LAB — PROTIME-INR
INR: 1 (ref 0.8–1.2)
Prothrombin Time: 13.4 s (ref 11.4–15.2)

## 2019-12-14 MED ORDER — MIDAZOLAM HCL 2 MG/2ML IJ SOLN
INTRAMUSCULAR | Status: AC | PRN
  Administered 2019-12-14 (×2): 1 mg via INTRAVENOUS

## 2019-12-14 MED ORDER — FENTANYL CITRATE (PF) 100 MCG/2ML IJ SOLN
INTRAMUSCULAR | Status: AC
  Filled 2019-12-14: qty 2

## 2019-12-14 MED ORDER — HEPARIN SOD (PORK) LOCK FLUSH 100 UNIT/ML IV SOLN
500.0000 [IU] | Freq: Once | INTRAVENOUS | Status: AC
  Administered 2019-12-14: 500 [IU]
  Filled 2019-12-14: qty 5

## 2019-12-14 MED ORDER — SODIUM CHLORIDE 0.9 % IV SOLN: INTRAVENOUS | Status: DC

## 2019-12-14 MED ORDER — FENTANYL CITRATE (PF) 100 MCG/2ML IJ SOLN
INTRAMUSCULAR | Status: AC | PRN
  Administered 2019-12-14 (×2): 50 ug via INTRAVENOUS

## 2019-12-14 MED ORDER — LIDOCAINE HCL (PF) 1 % IJ SOLN
INTRAMUSCULAR | Status: AC | PRN
  Administered 2019-12-14: 10 mL

## 2019-12-14 MED ORDER — MIDAZOLAM HCL 2 MG/2ML IJ SOLN
INTRAMUSCULAR | Status: AC
  Filled 2019-12-14: qty 2

## 2019-12-14 MED ORDER — LIDOCAINE HCL 1 % IJ SOLN
INTRAMUSCULAR | Status: AC
  Filled 2019-12-14: qty 20

## 2019-12-14 NOTE — Procedures (Signed)
  Procedure: US core liver lesion 18g x3 EBL:   minimal Complications:  none immediate  See full dictation in [facility] PACS.  D. Judyth Demarais MD Main # [phone] Pager  [phone]    

## 2019-12-14 NOTE — H&P (Signed)
Chief Complaint: Liver lesion  Referring Physician(s): Chauncey Cruel  Supervising Physician: Arne Cleveland  Patient Status: [facility] - Out-pt  History of Present Illness: Tracie [name] is a 68 y.o. female who has been treated with anastrozole and trastuzumab for triple positive breast cancer.  Her tumor markers have been rising= Results for TAWNI, WOLPER (MRN [id number]) as of 12/07/2019 13:19  Ref. Range 11/23/2018 12:04 02/15/2019 08:20 05/11/2019 13:30 10/26/2019 13:56 11/16/2019 13:05  CA 27.29 Latest Ref Range: 0.0 - 38.6 U/mL 25.4 29.7 39.2 (H) 95.5 (H) 154.4 (H)   Chest CT on 12/05/2019 showed= Interval development of tiny bilateral pulmonary nodules, highly concerning for metastatic disease; interval development of ill-defined hypoattenuating lesions in the liver parenchyma  MRI done 12/12/2019 showed = Multiple hepatic metastasis, the majority of which are centered in segment 4. Dominant segment 4 B mass measures 5.1 x 4.3 cm.  We are asked to perform a biopsy today.  She is NPO. No nausea/vomiting. No Fever/chills. ROS negative.   Past Medical History:  Diagnosis Date  . Angina pectoris ([place])    history of   . Arthritis   . Asthma   . Cancer ([place])    right breast  . Constipation   . Diverticulitis   . History of radiation therapy 04/07/18- 05/11/18   50 Gy in 25 fractions to right breast and regional nodes with four fields (no boost)  . History of radiation therapy 04/21/2018   T8 spine, 18 Gy in 1 fraction for a total dose of 18 Gy.   Marland Kitchen HPV in female   . Hyperlipidemia   . Hypertension   . Hypothyroidism   . Wears dentures   . Wears glasses     Past Surgical History:  Procedure Laterality Date  . BREAST LUMPECTOMY WITH RADIOACTIVE SEED AND SENTINEL LYMPH NODE BIOPSY Right 03/08/2018   Procedure: RIGHT BREAST RADIOACTIVE SEED GUIDED LUMPECTOMY WITH RIGHT AXILLARY RADIOACTIVE SEED TARGETED LYMPH NODE EXCISION AND SENTINEL LYMPH NODE BIOPSY,  INJECT BLUE DYE RIGHT BREAST;  Surgeon: [name], MD;  Location: [facility];  Service: General;  Laterality: Right;  . CESAREAN SECTION    . COLON SURGERY  2012   sigmoidectomy  . COLOSTOMY    . COLOSTOMY TAKEDOWN  10/06/11  . IR BONE TUMOR(S)RF ABLATION  11/02/2017  . IR KYPHO THORACIC WITH BONE BIOPSY  11/02/2017  . IR RADIOLOGIST EVAL & MGMT  10/07/2017  . IR RADIOLOGIST EVAL & MGMT  11/25/2017  . PORTACATH PLACEMENT N/A 09/25/2017   Procedure: INSERTION PORT-A-CATH WITH ULTRA SOUND ERAS PATHWAY;  Surgeon: [name], MD;  Location: [facility];  Service: General;  Laterality: N/A;  . TONSILLECTOMY    . TUBAL LIGATION      Allergies: Fish allergy, Other, Ace inhibitors, and Tylenol [acetaminophen]  Medications: Prior to Admission medications   Medication Sig Start Date End Date Taking? Authorizing Provider  ADVAIR DISKUS 250-50 MCG/DOSE AEPB INL 1 PUFF ITL Q 12 H 09/08/19   [provider]  albuterol (PROAIR HFA) 108 (90 Base) MCG/ACT inhaler INHALE 2 PUFFS BY MOUTH INTO THE LUNGS EVERY 6 HOURS AS NEEDED FOR WHEEZING 10/19/18   [provider]  Alirocumab (PRALUENT) 75 MG/ML SOPN Inject 75 mg into the skin every 14 (fourteen) days. 11/02/18   [name], MD  amLODipine (NORVASC) 10 MG tablet Take 10 mg by mouth daily.  09/01/17   [provider]  anastrozole (ARIMIDEX) 1 MG tablet TAKE 1 TABLET(1 MG) BY MOUTH  DAILY 07/25/19   Magrinat, [name], MD  aspirin EC 81 MG tablet Take 1 tablet (81 mg total) by mouth daily. 01/04/19   [name], MD  cetirizine (ZYRTEC) 10 MG tablet Take 10 mg by mouth daily as needed for allergies.  12/10/17   [provider]  levothyroxine (SYNTHROID, LEVOTHROID) 50 MCG tablet Take 50 mcg by mouth daily. 05/27/16   [provider]  lidocaine-prilocaine (EMLA) cream Apply 1 application topically as needed (port access). 09/07/19   Gardenia Phlegm, NP  losartan-hydrochlorothiazide (HYZAAR) 100-12.5 MG  tablet Take 1 tablet by mouth daily.  08/17/17   [provider]  metoprolol succinate (TOPROL-XL) 100 MG 24 hr tablet Take 1 tablet (100 mg total) by mouth daily. 11/28/19   [name], MD  naproxen (NAPROSYN) 500 MG tablet TAKE 1 TABLET(500 MG) BY MOUTH TWICE DAILY WITH MEALS FOR BACK OR LEG PAIN 11/14/19   [provider]  nicotine (NICODERM CQ - DOSED IN MG/24 HOURS) 14 mg/24hr patch Place 1 patch (14 mg total) onto the skin daily. 01/04/19   [name], MD  nitroGLYCERIN (NITROSTAT) 0.4 MG SL tablet Place 1 tablet (0.4 mg total) under the tongue every 5 (five) minutes as needed for chest pain. 09/06/18   [name], MD  potassium chloride (K-DUR) 10 MEQ tablet TAKE 2 TABLETS(20 MEQ) BY MOUTH DAILY 12/02/18   Magrinat, [name], MD  PRALUENT 75 MG/ML SOAJ INJECT 75 MG INTO THE SKIN EVERY 14 DAYS 11/23/19   [name], MD  prochlorperazine (COMPAZINE) 10 MG tablet Take 1 tablet (10 mg total) by mouth every 6 (six) hours as needed (Nausea or vomiting). 12/24/17 02/16/18  Gardenia Phlegm, NP     Family History  Problem Relation Age of Onset  . Cancer Mother        rectal  . Hypertension Brother   . Hypertension Brother   . Asthma Son   . Asthma Brother     Social History   Socioeconomic History  . Marital status: Divorced    Spouse name: Not on file  . Number of children: Not on file  . Years of education: Not on file  . Highest education level: Not on file  Occupational History  . Not on file  Tobacco Use  . Smoking status: Current Some Day Smoker    Packs/day: 0.25    Years: 40.00    Pack years: 10.00    Types: Cigarettes  . Smokeless tobacco: Never Used  . Tobacco comment: she is smoking 2 or more daily, she plans to use nicotine patch.   Substance and Sexual Activity  . Alcohol use: Yes    Comment: occasional  . Drug use: No  . Sexual activity: Not on file  Other Topics Concern  . Not on file  Social History Narrative  .  Not on file   Social Determinants of Health   Financial Resource Strain:   . Difficulty of Paying Living Expenses: Not on file  Food Insecurity:   . Worried About Charity fundraiser in the Last Year: Not on file  . Ran Out of Food in the Last Year: Not on file  Transportation Needs:   . Lack of Transportation (Medical): Not on file  . Lack of Transportation (Non-Medical): Not on file  Physical Activity:   . Days of Exercise per Week: Not on file  . Minutes of Exercise per Session: Not on file  Stress:   .  Feeling of Stress : Not on file  Social Connections:   . Frequency of Communication with Friends and Family: Not on file  . Frequency of Social Gatherings with Friends and Family: Not on file  . Attends Religious Services: Not on file  . Active Member of Clubs or Organizations: Not on file  . Attends Archivist Meetings: Not on file  . Marital Status: Not on file     Review of Systems: A 12 point ROS discussed and pertinent positives are indicated in the HPI above.  All other systems are negative.  Review of Systems  Vital Signs: BP (!) 170/83 (BP Location: Left Arm)   Pulse 95   Temp 98.5 F (36.9 C) (Oral)   Resp 18   SpO2 95%   Physical Exam Vitals reviewed.  Constitutional:      Appearance: Normal appearance.  HENT:     Head: Normocephalic and atraumatic.  Eyes:     Extraocular Movements: Extraocular movements intact.  Cardiovascular:     Rate and Rhythm: Normal rate and regular rhythm.  Pulmonary:     Effort: Pulmonary effort is normal. No respiratory distress.     Breath sounds: Normal breath sounds.  Abdominal:     General: There is no distension.     Palpations: Abdomen is soft.     Tenderness: There is no abdominal tenderness.  Musculoskeletal:        General: Normal range of motion.  Skin:    General: Skin is warm and dry.  Neurological:     General: No focal deficit present.     Mental Status: She is alert and oriented to person,  place, and time.  Psychiatric:        Mood and Affect: Mood normal.        Behavior: Behavior normal.        Thought Content: Thought content normal.        Judgment: Judgment normal.     Imaging: CT Chest W Contrast  Result Date: 12/05/2019 CLINICAL DATA:  Breast cancer.  Rising tumor markers. EXAM: CT CHEST WITH CONTRAST TECHNIQUE: Multidetector CT imaging of the chest was performed during intravenous contrast administration. CONTRAST:  41mL OMNIPAQUE IOHEXOL 300 MG/ML  SOLN COMPARISON:  04/07/2019 FINDINGS: Cardiovascular: Heart size upper normal to mildly increased. No pericardial effusion. Coronary artery calcification is evident. Atherosclerotic calcification is noted in the wall of the thoracic aorta. Right Port-A-Cath tip is positioned in the distal SVC. Mediastinum/Nodes: No mediastinal lymphadenopathy. There is no hilar lymphadenopathy. The esophagus has normal imaging features. There is no axillary lymphadenopathy. Lungs/Pleura: Centrilobular emphsyema noted. Subpleural reticulation anterior right lung likely fibrosis from radiation therapy. Multiple new bilateral pulmonary nodules are identified. *Lateral left upper lobe on 65/7. *5 mm posterior left upper lobe on 71/7. *4 mm right lower lobe on 89/7. *5 mm anterior right middle lobe on 90/7. *8 mm anterior right middle lobe on 91/7. No focal airspace consolidation. No pulmonary edema or pleural effusion. Upper Abdomen: Interval development of multiple ill-defined hypoattenuating lesions in the liver, most prominently in the segment IV. Similar appearance low-density right adrenal nodule with low attenuation suggesting adenoma. Nodular thickening of the left adrenal gland is stable. Musculoskeletal: No worrisome lytic or sclerotic osseous abnormality. Status post thoracic vertebral body augmentation. IMPRESSION: 1. Interval development of tiny bilateral pulmonary nodules, highly concerning for metastatic disease. 2. Interval development of  ill-defined hypoattenuating lesions in the liver parenchyma. These are poorly characterized on today's study, potentially due to  bolus timing. MRI of the abdomen with and without contrast material recommended to further evaluate as metastatic disease is a distinct concern. 3. Stable nodular thickening of the adrenal glands, most likely related to adenoma/hyperplasia. 4.  Aortic Atherosclerois (ICD10-170.0) 5.  Emphysema. [id number]) Electronically Signed   By: Misty Stanley M.D.   On: 12/05/2019 13:51   NM Bone Scan Whole Body  Result Date: 12/05/2019 CLINICAL DATA:  Low back pain, history of breast cancer status post right lumpectomy with seed therapy and nodal resection EXAM: NUCLEAR MEDICINE WHOLE BODY BONE SCAN TECHNIQUE: Whole body anterior and posterior images were obtained approximately 3 hours after intravenous injection of radiopharmaceutical. RADIOPHARMACEUTICALS:  19.5 mCi Technetium-35m MDP IV COMPARISON:  Whole-body bone scan dated 04/07/2019. Concurrent CT chest dated 12/05/2019. FINDINGS: Mild uptake involving multiple mid vertebral bodies and L2. When correlating with concurrent CT chest, this is favored to be degenerative. No abnormal accumulation of radiotracer within the axillary or appendicular skeleton to suggest skeletal metastases. IMPRESSION: No abnormal accumulation of radiotracer within the axillary or appendicular skeleton to suggest skeletal metastases. Electronically Signed   By: Julian Hy M.D.   On: 12/05/2019 19:30   MR LIVER W WO CONTRAST  Result Date: 12/12/2019 CLINICAL DATA:  Breast cancer. Evaluate for metastasis. Metastasis suspected. EXAM: MRI ABDOMEN WITHOUT AND WITH CONTRAST TECHNIQUE: Multiplanar multisequence MR imaging of the abdomen was performed both before and after the administration of intravenous contrast. CONTRAST:  78mL GADAVIST GADOBUTROL 1 MMOL/ML IV SOLN COMPARISON:  Chest CT 12/05/2019.  Bone scan 12/05/2019 FINDINGS: Lower chest: Mild  cardiomegaly, without pericardial or pleural effusion. Hepatobiliary: Multiple hepatic metastasis, the majority of which are centered in segment 4. Dominant segment 4 B mass measures 5.1 x 4.3 cm on 21/4. Index segment 4A lesion measures 1.7 cm on 11/04. Multiple smaller lateral segment left liver lobe lesions, including a segment 2 1.2 cm lesion on 12/04. Normal gallbladder, without biliary ductal dilatation. Pancreas:  Normal, without mass or ductal dilatation. Spleen:  Normal in size, without focal abnormality. Adrenals/Urinary Tract: Bilateral adrenal thickening and nodularity, similar back to 07/30/2018, and decreased in signal on out of phase imaging, favoring hyperplasia and small adenomas. Normal left kidney. Too small to characterize right renal lesion. No hydronephrosis. Stomach/Bowel: Normal stomach and abdominal bowel loops. Vascular/Lymphatic: Aortic atherosclerosis. No abdominal adenopathy. Other:  No ascites.  No evidence of omental or peritoneal disease. Musculoskeletal: No acute osseous abnormality. IMPRESSION: 1. Moderate volume hepatic metastasis, primarily within segment 4 as detailed above. 2. No extrahepatic abdominal metastasis identified. 3.  Aortic Atherosclerosis (ICD10-I70.0). Electronically Signed   By: Abigail Miyamoto M.D.   On: 12/12/2019 13:40   ECHOCARDIOGRAM COMPLETE  Result Date: 11/21/2019   ECHOCARDIOGRAM REPORT   Patient Name:   RAELYNNE FADEL Date of Exam: 11/21/2019 Medical Rec #:  [id number]         Height:       63.0 in Accession #:    [id number]        Weight:       172.6 lb Date of Birth:  [birth year]          BSA:          1.82 m Patient Age:    71 years          BP:           170/92 mmHg Patient Gender: F                 HR:  80 bpm. Exam Location:  Outpatient Procedure: 2D Echo, Color Doppler, Cardiac Doppler and Strain Analysis Indications:    Chemotherapy evaluation v87.41  History:        Patient has prior history of Echocardiogram examinations, most                  recent 06/17/2019. Risk Factors:Hypertension and Current Smoker.  Sonographer:    Paulita Fujita RDCS Referring Phys: [place]  1. Left ventricular ejection fraction, by visual estimation, is 50%. The left ventricle has mildly decreased function. There is mildly increased left ventricular hypertrophy. Basal inferior and basal to mid inferolateral akinesis.  2. Left ventricular diastolic parameters are consistent with Grade I diastolic dysfunction (impaired relaxation).  3. Global right ventricle has normal systolic function.The right ventricular size is normal. No increase in right ventricular wall thickness.  4. Left atrial size was normal.  5. Right atrial size was normal.  6. Trivial pericardial effusion is present.  7. The mitral valve is normal in structure. Moderate mitral valve regurgitation. Suspect infarct-related mitral regurgitation with LCx-territory wall motion abnormalities. No evidence of mitral stenosis.  8. The tricuspid valve is normal in structure. Tricuspid valve regurgitation is trivial.  9. The aortic valve is tricuspid. Aortic valve regurgitation is mild to moderate. Mild aortic valve sclerosis without stenosis. 10. The inferior vena cava is normal in size with greater than 50% respiratory variability, suggesting right atrial pressure of 3 mmHg. 11. The tricuspid regurgitant velocity is 2.32 m/s, and with an assumed right atrial pressure of 3 mmHg, the estimated right ventricular systolic pressure is normal at 24.5 mmHg. FINDINGS  Left Ventricle: Left ventricular ejection fraction, by visual estimation, is 50%. The left ventricle has mildly decreased function. The left ventricular internal cavity size was the left ventricle is normal in size. There is mildly increased left ventricular hypertrophy. Left ventricular diastolic parameters are consistent with Grade I diastolic dysfunction (impaired relaxation). Right Ventricle: The right ventricular size is normal.  No increase in right ventricular wall thickness. Global RV systolic function is has normal systolic function. The tricuspid regurgitant velocity is 2.32 m/s, and with an assumed right atrial pressure  of 3 mmHg, the estimated right ventricular systolic pressure is normal at 24.5 mmHg. Left Atrium: Left atrial size was normal in size. Right Atrium: Right atrial size was normal in size Pericardium: Trivial pericardial effusion is present. Mitral Valve: The mitral valve is normal in structure. No evidence of mitral valve stenosis by observation. Moderate mitral valve regurgitation. Tricuspid Valve: The tricuspid valve is normal in structure. Tricuspid valve regurgitation is trivial. Aortic Valve: The aortic valve is tricuspid. Aortic valve regurgitation is mild to moderate. Aortic regurgitation PHT measures 663 msec. Mild aortic valve sclerosis is present, with no evidence of aortic valve stenosis. Pulmonic Valve: The pulmonic valve was normal in structure. Pulmonic valve regurgitation is not visualized. Aorta: The aortic root is normal in size and structure. Venous: The inferior vena cava is normal in size with greater than 50% respiratory variability, suggesting right atrial pressure of 3 mmHg. IAS/Shunts: No atrial level shunt detected by color flow Doppler.  LEFT VENTRICLE PLAX 2D LVIDd:         5.00 cm       Diastology LVIDs:         3.70 cm       LV e' lateral:   8.05 cm/s LV PW:         0.90 cm  LV E/e' lateral: 10.0 LV IVS:        0.90 cm       LV e' medial:    5.87 cm/s LVOT diam:     2.00 cm       LV E/e' medial:  13.7 LV SV:         60 ml LV SV Index:   31.76 LVOT Area:     3.14 cm  LV Volumes (MOD) LV area d, A2C:    44.80 cm LV area d, A4C:    40.00 cm LV area s, A2C:    27.80 cm LV area s, A4C:    25.80 cm LV major d, A2C:   9.72 cm LV major d, A4C:   9.21 cm LV major s, A2C:   7.98 cm LV major s, A4C:   8.17 cm LV vol d, MOD A2C: 173.0 ml LV vol d, MOD A4C: 142.0 ml LV vol s, MOD A2C: 83.2 ml  LV vol s, MOD A4C: 70.2 ml LV SV MOD A2C:     89.8 ml LV SV MOD A4C:     142.0 ml LV SV MOD BP:      82.7 ml RIGHT VENTRICLE RV S prime:     13.50 cm/s TAPSE (M-mode): 2.4 cm LEFT ATRIUM             Index       RIGHT ATRIUM           Index LA diam:        3.80 cm 2.09 cm/m  RA Area:     13.40 cm LA Vol (A2C):   55.9 ml 30.78 ml/m RA Volume:   29.90 ml  16.46 ml/m LA Vol (A4C):   38.9 ml 21.42 ml/m LA Biplane Vol: 48.6 ml 26.76 ml/m  AORTIC VALVE LVOT Vmax:   90.20 cm/s LVOT Vmean:  63.300 cm/s LVOT VTI:    0.184 m AI PHT:      663 msec  AORTA Ao Root diam: 3.20 cm MITRAL VALVE                        TRICUSPID VALVE MV Area (PHT): 6.37 cm             TR Peak grad:   21.5 mmHg MV PHT:        34.51 msec           TR Vmax:        232.00 cm/s MV Decel Time: 119 msec MR Peak grad:    145.4 mmHg         SHUNTS MR Mean grad:    93.0 mmHg          Systemic VTI:  0.18 m MR Vmax:         603.00 cm/s        Systemic Diam: 2.00 cm MR Vmean:        453.0 cm/s MR PISA:         0.57 cm MR PISA Eff ROA: 4 mm MR PISA Radius:  0.30 cm MV E velocity: 80.50 cm/s 103 cm/s MV A velocity: 92.70 cm/s 70.3 cm/s MV E/A ratio:  0.87       1.5  Loralie Champagne MD Electronically signed by Loralie Champagne MD Signature Date/Time: 11/21/2019/12:33:14 PM    Final     Labs:  CBC: Recent Labs    10/26/19 1356 11/16/19 1305 12/07/19 1237 12/14/19 1136  WBC 4.5  5.0 5.4 4.9  HGB 11.7* 11.1* 10.9* 10.3*  HCT 36.5 35.2* 34.6* 33.3*  PLT 356 409* 438* 450*    COAGS: No results for input(s): INR, APTT in the last 8760 hours.  BMP: Recent Labs    10/06/19 1351 10/26/19 1356 11/16/19 1305 12/07/19 1237  NA 142 144 142 141  K 3.7 3.5 3.5 4.0  CL 106 106 104 104  CO2 26 26 26 27   GLUCOSE 108* 115* 91 98  BUN 19 13 11 13   CALCIUM 9.7 9.4 9.5 9.3  CREATININE 1.14* 1.03* 0.84 0.80  GFRNONAA 49* 55* >60 >60  GFRAA 56* >60 >60 >60    LIVER FUNCTION TESTS: Recent Labs    10/06/19 1351 10/26/19 1356 11/16/19 1305  12/07/19 1237  BILITOT <0.2* <0.2* <0.2* <0.2*  AST 13* 10* 11* 13*  ALT 13 10 12 11   ALKPHOS 93 102 124 156*  PROT 7.0 7.0 7.1 7.3  ALBUMIN 3.4* 3.2* 3.3* 3.2*    TUMOR MARKERS: No results for input(s): AFPTM, CEA, CA199, CHROMGRNA in the last 8760 hours.  Assessment and Plan:  Known triple positive breast cancer with multiple hepatic metastasis on MRI.  Will proceed with image guided biopsy today by Dr. Vernard Gambles.  Risks and benefits of liver biopsy  was discussed with the patient and/or patient's family including, but not limited to bleeding, infection, damage to adjacent structures or low yield requiring additional tests.  All of the questions were answered and there is agreement to proceed.  Consent signed and in chart.  Thank you for this interesting consult.  I greatly enjoyed meeting Teyona B Mathes and look forward to participating in their care.  A copy of this report was sent to the requesting provider on this date.  Electronically Signed: Murrell Redden, PA-C   12/14/2019, 12:14 PM      I spent a total of  30 Minutes in face to face in clinical consultation, greater than 50% of which was counseling/coordinating care for liver lesion biopsy.

## 2019-12-14 NOTE — Discharge Instructions (Signed)
Liver Biopsy, Care After These instructions give you information on caring for yourself after your procedure. Your doctor may also give you more specific instructions. Call your doctor if you have any problems or questions after your procedure. What can I expect after the procedure? After the procedure, it is common to have:  Pain and soreness where the biopsy was done.  Bruising around the area where the biopsy was done.  Sleepiness and be tired for a few days. Follow these instructions at home: Medicines  Take over-the-counter and prescription medicines only as told by your doctor.  If you were prescribed an antibiotic medicine, take it as told by your doctor. Do not stop taking the antibiotic even if you start to feel better.  Do not take medicines such as aspirin and ibuprofen. These medicines can thin your blood. Do not take these medicines unless your doctor tells you to take them.  If you are taking prescription pain medicine, take actions to prevent or treat constipation. Your doctor may recommend that you: ? Drink enough fluid to keep your pee (urine) clear or pale yellow. ? Take over-the-counter or prescription medicines. ? Eat foods that are high in fiber, such as fresh fruits and vegetables, whole grains, and beans. ? Limit foods that are high in fat and processed sugars, such as fried and sweet foods. Caring for your cut  Follow instructions from your doctor about how to take care of your cuts from surgery (incisions). Make sure you: ? Wash your hands with soap and water before you change your bandage (dressing). If you cannot use soap and water, use hand sanitizer. ? Change your bandage as told by your doctor. You may remove bandaide 12/15/2019 and replace it only for comfort as needed. .  Check your cuts every day for signs of infection. Check for: ? Redness, swelling, or more pain. ? Fluid or blood. ? Pus or a bad smell. ? Warmth.  Do not take baths, swim, or  use a hot tub until your doctor says it is okay to do so. You may shower 12/15/2019 around 1:30 PM.  Activity   Rest at home for 1-2 days or as told by your doctor. ? Avoid sitting for a long time without moving. Get up to take short walks every 1-2 hours.  Return to your normal activities as told by your doctor. Ask what activities are safe for you.  Do not do these things in the first 24 hours: ? Drive. ? Use machinery. ? Take a bath or shower.  Do not lift more than 10 pounds (4.5 kg) or play contact sports for the first 2 weeks. General instructions   Do not drink alcohol in the first week after the procedure.  Have someone stay with you for at least 24 hours after the procedure.  Get your test results. Ask your doctor or the department that is doing the test: ? When will my results be ready? ? How will I get my results? ? What are my treatment options? ? What other tests do I need? ? What are my next steps?  Keep all follow-up visits as told by your doctor. This is important. Contact a doctor if:  A cut bleeds and leaves more than just a small spot of blood.  A cut is red, puffs up (swells), or hurts more than before.  Fluid or something else comes from a cut.  A cut smells bad.  You have a fever or chills. Get help  right away if:  You have swelling, bloating, or pain in your belly (abdomen).  You get dizzy or faint.  You have a rash.  You feel sick to your stomach (nauseous) or throw up (vomit).  You have trouble breathing, feel short of breath, or feel faint.  Your chest hurts.  You have problems talking or seeing.  You have trouble with your balance or moving your arms or legs. Summary  After the procedure, it is common to have pain, soreness, bruising, and tiredness.  Your doctor will tell you how to take care of yourself at home. Change your bandage, take your medicines, and limit your activities as told by your doctor.  Call your doctor if  you have symptoms of infection. Get help right away if your belly swells, your cut bleeds a lot, or you have trouble talking or breathing. This information is not intended to replace advice given to you by your health care provider. Make sure you discuss any questions you have with your health care provider. Document Released: 09/23/2008 Document Revised: 12/25/2017 Document Reviewed: 12/25/2017 Elsevier Patient Education  [employer].      Moderate Conscious Sedation, Adult, Care After These instructions provide you with information about caring for yourself after your procedure. Your health care provider may also give you more specific instructions. Your treatment has been planned according to current medical practices, but problems sometimes occur. Call your health care provider if you have any problems or questions after your procedure. What can I expect after the procedure? After your procedure, it is common:  To feel sleepy for several hours.  To feel clumsy and have poor balance for several hours.  To have poor judgment for several hours.  To vomit if you eat too soon. Follow these instructions at home: For at least 24 hours after the procedure:   Do not: ? Participate in activities where you could fall or become injured. ? Drive. ? Use heavy machinery. ? Drink alcohol. ? Take sleeping pills or medicines that cause drowsiness. ? Make important decisions or sign legal documents. ? Take care of children on your own.  Rest. Eating and drinking  Follow the diet recommended by your health care provider.  If you vomit: ? Drink water, juice, or soup when you can drink without vomiting. ? Make sure you have little or no nausea before eating solid foods. General instructions  Have a responsible adult stay with you until you are awake and alert.  Take over-the-counter and prescription medicines only as told by your health care provider.  If you smoke, do not smoke  without supervision.  Keep all follow-up visits as told by your health care provider. This is important. Contact a health care provider if:  You keep feeling nauseous or you keep vomiting.  You feel light-headed.  You develop a rash.  You have a fever. Get help right away if:  You have trouble breathing. This information is not intended to replace advice given to you by your health care provider. Make sure you discuss any questions you have with your health care provider. Document Released: 10/05/2013 Document Revised: 11/27/2017 Document Reviewed: 04/05/2016 Elsevier Patient Education  2020 [employer].

## 2019-12-16 ENCOUNTER — Inpatient Hospital Stay: Payer: Medicare Other

## 2019-12-16 ENCOUNTER — Other Ambulatory Visit: Payer: Self-pay

## 2019-12-16 ENCOUNTER — Other Ambulatory Visit: Payer: Self-pay | Admitting: Oncology

## 2019-12-16 ENCOUNTER — Telehealth: Payer: Self-pay

## 2019-12-16 VITALS — BP 124/68 | HR 64 | Temp 98.5°F | Resp 18

## 2019-12-16 DIAGNOSIS — C7951 Secondary malignant neoplasm of bone: Secondary | ICD-10-CM

## 2019-12-16 DIAGNOSIS — Z170 Estrogen receptor positive status [ER+]: Secondary | ICD-10-CM

## 2019-12-16 DIAGNOSIS — E876 Hypokalemia: Secondary | ICD-10-CM

## 2019-12-16 DIAGNOSIS — C50411 Malignant neoplasm of upper-outer quadrant of right female breast: Secondary | ICD-10-CM

## 2019-12-16 DIAGNOSIS — Z5112 Encounter for antineoplastic immunotherapy: Secondary | ICD-10-CM | POA: Diagnosis not present

## 2019-12-16 DIAGNOSIS — Z95828 Presence of other vascular implants and grafts: Secondary | ICD-10-CM

## 2019-12-16 LAB — CBC WITH DIFFERENTIAL (CANCER CENTER ONLY)
Abs Immature Granulocytes: 0.02 10*3/uL (ref 0.00–0.07)
Basophils Absolute: 0 10*3/uL (ref 0.0–0.1)
Basophils Relative: 1 %
Eosinophils Absolute: 0.2 10*3/uL (ref 0.0–0.5)
Eosinophils Relative: 4 %
HCT: 29.3 % — ABNORMAL LOW (ref 36.0–46.0)
Hemoglobin: 9.2 g/dL — ABNORMAL LOW (ref 12.0–15.0)
Immature Granulocytes: 0 %
Lymphocytes Relative: 17 %
Lymphs Abs: 0.9 10*3/uL (ref 0.7–4.0)
MCH: 26.6 pg (ref 26.0–34.0)
MCHC: 31.4 g/dL (ref 30.0–36.0)
MCV: 84.7 fL (ref 80.0–100.0)
Monocytes Absolute: 0.6 10*3/uL (ref 0.1–1.0)
Monocytes Relative: 12 %
Neutro Abs: 3.4 10*3/uL (ref 1.7–7.7)
Neutrophils Relative %: 66 %
Platelet Count: 413 10*3/uL — ABNORMAL HIGH (ref 150–400)
RBC: 3.46 MIL/uL — ABNORMAL LOW (ref 3.87–5.11)
RDW: 18 % — ABNORMAL HIGH (ref 11.5–15.5)
WBC Count: 5.2 10*3/uL (ref 4.0–10.5)
nRBC: 0 % (ref 0.0–0.2)

## 2019-12-16 LAB — CMP (CANCER CENTER ONLY)
ALT: 8 U/L (ref 0–44)
AST: 11 U/L — ABNORMAL LOW (ref 15–41)
Albumin: 3.2 g/dL — ABNORMAL LOW (ref 3.5–5.0)
Alkaline Phosphatase: 144 U/L — ABNORMAL HIGH (ref 38–126)
Anion gap: 11 (ref 5–15)
BUN: 9 mg/dL (ref 8–23)
CO2: 26 mmol/L (ref 22–32)
Calcium: 8.8 mg/dL — ABNORMAL LOW (ref 8.9–10.3)
Chloride: 104 mmol/L (ref 98–111)
Creatinine: 0.68 mg/dL (ref 0.44–1.00)
GFR, Est AFR Am: >60 mL/min (ref >60)
GFR, Estimated: >60 mL/min (ref >60)
Glucose, Bld: 107 mg/dL — ABNORMAL HIGH (ref 70–99)
Potassium: 3.3 mmol/L — ABNORMAL LOW (ref 3.5–5.1)
Sodium: 141 mmol/L (ref 135–145)
Total Bilirubin: <0.2 mg/dL — ABNORMAL LOW (ref 0.3–1.2)
Total Protein: 7 g/dL (ref 6.5–8.1)

## 2019-12-16 MED ORDER — SODIUM CHLORIDE 0.9 % IV SOLN
Freq: Once | INTRAVENOUS | Status: AC
  Filled 2019-12-16: qty 250

## 2019-12-16 MED ORDER — SODIUM CHLORIDE 0.9% FLUSH
10.0000 mL | INTRAVENOUS | Status: DC | PRN
  Administered 2019-12-16: 10 mL
  Filled 2019-12-16: qty 10

## 2019-12-16 MED ORDER — HEPARIN SOD (PORK) LOCK FLUSH 100 UNIT/ML IV SOLN
500.0000 [IU] | Freq: Once | INTRAVENOUS | Status: AC | PRN
  Administered 2019-12-16: 500 [IU]
  Filled 2019-12-16: qty 5

## 2019-12-16 MED ORDER — DIPHENHYDRAMINE HCL 25 MG PO CAPS
ORAL_CAPSULE | ORAL | Status: AC
  Filled 2019-12-16: qty 1

## 2019-12-16 MED ORDER — SODIUM CHLORIDE 0.9% FLUSH
10.0000 mL | Freq: Once | INTRAVENOUS | Status: AC
  Administered 2019-12-16: 10 mL
  Filled 2019-12-16: qty 10

## 2019-12-16 MED ORDER — ACETAMINOPHEN 325 MG PO TABS
ORAL_TABLET | ORAL | Status: AC
  Filled 2019-12-16: qty 2

## 2019-12-16 MED ORDER — ACETAMINOPHEN 325 MG PO TABS
650.0000 mg | ORAL_TABLET | Freq: Once | ORAL | Status: AC
  Administered 2019-12-16: 650 mg via ORAL

## 2019-12-16 MED ORDER — POTASSIUM CHLORIDE ER 10 MEQ PO TBCR: 10.0000 meq | EXTENDED_RELEASE_TABLET | Freq: Every day | ORAL | 1 refills | Status: AC

## 2019-12-16 MED ORDER — SODIUM CHLORIDE 0.9 % IV SOLN
3.8000 mg/kg | Freq: Once | INTRAVENOUS | Status: AC
  Administered 2019-12-16: 300 mg via INTRAVENOUS
  Filled 2019-12-16: qty 15

## 2019-12-16 MED ORDER — DIPHENHYDRAMINE HCL 25 MG PO CAPS
25.0000 mg | ORAL_CAPSULE | Freq: Once | ORAL | Status: AC
  Administered 2019-12-16: 25 mg via ORAL

## 2019-12-16 NOTE — Patient Instructions (Signed)

## 2019-12-16 NOTE — Telephone Encounter (Signed)
Prescription for potassium chloride sent to pt's preferred pharmacy

## 2019-12-16 NOTE — Patient Instructions (Signed)
[facility] Discharge Instructions for Patients Receiving Chemotherapy   PICK UP POTASSIUM PILLS AT [facility]  Today you received the following chemotherapy agents [place].  To help prevent nausea and vomiting after your treatment, we encourage you to take your nausea medication as directed.   If you develop nausea and vomiting that is not controlled by your nausea medication, call the clinic.   BELOW ARE SYMPTOMS THAT SHOULD BE REPORTED IMMEDIATELY:  *FEVER GREATER THAN 100.5 F  *CHILLS WITH OR WITHOUT FEVER  NAUSEA AND VOMITING THAT IS NOT CONTROLLED WITH YOUR NAUSEA MEDICATION  *UNUSUAL SHORTNESS OF BREATH  *UNUSUAL BRUISING OR BLEEDING  TENDERNESS IN MOUTH AND THROAT WITH OR WITHOUT PRESENCE OF ULCERS  *URINARY PROBLEMS  *BOWEL PROBLEMS  UNUSUAL RASH Items with * indicate a potential emergency and should be followed up as soon as possible.  Feel free to call the clinic you have any questions or concerns. The clinic phone number is [phone].  Please show the [facility] at check-in to the Emergency Department and triage nurse.

## 2019-12-17 LAB — CANCER ANTIGEN 27.29: CA 27.29: 263.4 U/mL — ABNORMAL HIGH (ref 0.0–38.6)

## 2019-12-19 ENCOUNTER — Telehealth: Payer: Self-pay | Admitting: *Deleted

## 2019-12-20 LAB — SURGICAL PATHOLOGY

## 2019-12-21 ENCOUNTER — Other Ambulatory Visit: Payer: Managed Care, Other (non HMO)

## 2019-12-21 ENCOUNTER — Ambulatory Visit: Payer: Managed Care, Other (non HMO)

## 2019-12-22 ENCOUNTER — Telehealth: Payer: Self-pay | Admitting: Oncology

## 2019-12-22 NOTE — Telephone Encounter (Signed)
Scheduled appt per 12/24 sch message - pt aware of appt changes

## 2019-12-28 ENCOUNTER — Other Ambulatory Visit: Payer: Managed Care, Other (non HMO)

## 2019-12-28 ENCOUNTER — Ambulatory Visit: Payer: Managed Care, Other (non HMO) | Admitting: Oncology

## 2019-12-28 ENCOUNTER — Ambulatory Visit: Payer: Managed Care, Other (non HMO)

## 2020-01-03 ENCOUNTER — Ambulatory Visit: Payer: Medicare Other | Admitting: Oncology

## 2020-01-03 ENCOUNTER — Other Ambulatory Visit: Payer: Medicare Other

## 2020-01-06 ENCOUNTER — Inpatient Hospital Stay: Payer: Medicare Other

## 2020-01-06 ENCOUNTER — Inpatient Hospital Stay: Payer: Medicare Other | Admitting: Adult Health

## 2020-01-06 NOTE — Progress Notes (Deleted)
[facility]  [phone] [phone]) [phone]     ID: Tracie [name] DOB: 1951/09/25  MR#: [id number]  JDY#:[id number]  Patient Care Team: Bartholome Bill, MD as PCP - General (Family Medicine) [name], MD as Consulting Physician (General Surgery) Magrinat, [name], MD as Consulting Physician (Oncology) [name], MD as Attending Physician (Radiation Oncology) Mottinger, [place] DDS (Physical Therapy) [name], MD as Consulting Physician (Cardiology) [name], [name], NP as Nurse Practitioner (Hematology and Oncology) OTHER MD:   CHIEF COMPLAINT: Triple positive breast cancer  CURRENT TREATMENT: trastuzumab; anastrozole, zoledronate   INTERVAL HISTORY: Tracie [name] returns today for follow-up and treatment of her triple positive breast cancer.  She has been treated with anastrozole and trastuzumab with her trastuzumab dose due today.  However her tumor marker has been rising: Results for BRISTOL, OSENTOSKI (MRN [id number]) as of 12/07/2019 13:19  Ref. Range 11/23/2018 12:04 02/15/2019 08:20 05/11/2019 13:30 10/26/2019 13:56 11/16/2019 13:05  CA 27.29 Latest Ref Range: 0.0 - 38.6 U/mL 25.4 29.7 39.2 (H) 95.5 (H) 154.4 (H)   Accordingly she underwent chest CT on 12/05/2019, which showed: interval development of tiny bilateral pulmonary nodules, highly concerning for metastatic disease; interval development of ill-defined hypoattenuating lesions in the liver parenchyma, poorly characterized on today's study; stable nodular thickening of adrenal glands, most likely related to adenoma/hyperplasia.  Bone scan performed the same day showed no abnormal accumulation of radiotracer within the axillary or appendicular skeleton to suggest skeletal metastase She receives zoledronate every 12 weeks, with her most recent dose 10/26/2019  She underwent repeat echocardiogram on 11/21/2019, which showed an ejection fraction of 50%.  This was  reviewed by Dr. Aundra Dubin.  He does not think the echo changes are due to Herceptin but likely to ischemia related to the circumflex.  He is managing this medically with losartan Toprol and suggested he can we continue Herceptin with repeat echo in 4 weeks  REVIEW OF SYSTEMS: Tracie [name] is doing well since retirement.  She has been doing a lot of decorating in the house, reading, and enjoying her grandchildren she has 3 in town (ages 72 and 54).  She feels that they are "safe" as far as the coronavirus is concerned.  She has had no altered taste, no loss of appetite, no nausea or vomiting.  She has had no cough phlegm production or pleurisy no shortness of breath.  Detailed review of systems today was otherwise stable.   HISTORY OF CURRENT ILLNESS: From the original intake note:  Tracie [name] noted a change in her right breast sometime in July 2018. [facility] to report that she had found a "kernel" in her breast. She was advised to see her primary physician which she did. The patient was then set up for bilateral diagnostic mammography with tomography and right breast ultrasonography at [facility] 09/03/2017. The breast density was category I a. In the upper outer quadrant of the right breast there was a 2.1 cm high density mass, which was palpable. Also in the right breast more posteriorly there was a 1.5 cm high density mass which was felt to be a suspicious lymph node. Ultrasound of the right breast confirmed a 2.1 centimeter lobulated solid mass in the right breast upper outer quadrant 15.8 cm from the nipple in the 10:00 radiant. There was a 1.5 cm oval mass in the right axillary tail with a small rounded masses also suspicious for lymph node involvement. A total of 3 suspicious lymph nodes were  identified.  On 09/03/2017 the patient underwent biopsy of the breast mass and the suspicious right axillary lymph node. The final pathology ([id number]) found invasive ductal carcinoma, grade 3, in  both. Both tumors were estrogen receptor positive at 70-75%, and both were progesterone receptor negative. Both had an elevated proliferation marker at 90%. The mass in the breast was HER-2 negative, with a signals ratio of 1.25 and the number per cell 1.88. The lymph node mass however was HER-2 positive with a signals ratio of 2.57, and the number per cell 3.60.  The patient's subsequent history is as detailed below.   PAST MEDICAL HISTORY: Past Medical History:  Diagnosis Date  . Angina pectoris ([place])    history of   . Arthritis   . Asthma   . Cancer ([place])    right breast  . Constipation   . Diverticulitis   . History of radiation therapy 04/07/18- 05/11/18   50 Gy in 25 fractions to right breast and regional nodes with four fields (no boost)  . History of radiation therapy 04/21/2018   T8 spine, 18 Gy in 1 fraction for a total dose of 18 Gy.   Marland Kitchen HPV in female   . Hyperlipidemia   . Hypertension   . Hypothyroidism   . Wears dentures   . Wears glasses     PAST SURGICAL HISTORY: Past Surgical History:  Procedure Laterality Date  . BREAST LUMPECTOMY WITH RADIOACTIVE SEED AND SENTINEL LYMPH NODE BIOPSY Right 03/08/2018   Procedure: RIGHT BREAST RADIOACTIVE SEED GUIDED LUMPECTOMY WITH RIGHT AXILLARY RADIOACTIVE SEED TARGETED LYMPH NODE EXCISION AND SENTINEL LYMPH NODE BIOPSY, INJECT BLUE DYE RIGHT BREAST;  Surgeon: [name], MD;  Location: [facility];  Service: General;  Laterality: Right;  . CESAREAN SECTION    . COLON SURGERY  2012   sigmoidectomy  . COLOSTOMY    . COLOSTOMY TAKEDOWN  10/06/11  . IR BONE TUMOR(S)RF ABLATION  11/02/2017  . IR KYPHO THORACIC WITH BONE BIOPSY  11/02/2017  . IR RADIOLOGIST EVAL & MGMT  10/07/2017  . IR RADIOLOGIST EVAL & MGMT  11/25/2017  . PORTACATH PLACEMENT N/A 09/25/2017   Procedure: INSERTION PORT-A-CATH WITH ULTRA SOUND ERAS PATHWAY;  Surgeon: [name], MD;  Location: [facility];  Service: General;  Laterality: N/A;  . TONSILLECTOMY    .  TUBAL LIGATION      FAMILY HISTORY: Family History  Problem Relation Age of Onset  . Cancer Mother        rectal  . Hypertension Brother   . Hypertension Brother   . Asthma Son   . Asthma Brother    The patient has little information regarding her father. Her mother died at age 77 from a strange cancer--the patient does not know what it was, it may well have been cervical cancer from her description. The patient has one brother, no sisters. There is no history of breast or ovarian cancer in the family to the patient's knowledge    GYNECOLOGIC HISTORY:  No LMP recorded. Patient is postmenopausal.  menarche age 24, first live birth age 2 the patient is [place] P3. She stopped having periods at age 45. She never used oral contraceptives or hormone replacement.    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  She is divorced.  Her son [name] stays with her sometimes. He is a Art gallery manager and therefore not working during the pandemic. The 2 other children are Marrianne Mood  Wolpert, who lives in [place] and works in the theater and media, and Cedra Villalon lives in [place], and is Engineer, maintenance of a [employer]. The patient has 5 grandchildren. She is a Psychologist, forensic.   ADVANCED DIRECTIVES: Not in place    HEALTH MAINTENANCE: Social History   Tobacco Use  . Smoking status: Current Some Day Smoker    Packs/day: 0.25    Years: 40.00    Pack years: 10.00    Types: Cigarettes  . Smokeless tobacco: Never Used  . Tobacco comment: she is smoking 2 or more daily, she plans to use nicotine patch.   Substance Use Topics  . Alcohol use: Yes    Comment: occasional  . Drug use: No    Colonoscopy: September 2012   PAP:  Bone density:09/03/2017    Allergies  Allergen Reactions  . Fish Allergy Anaphylaxis and Swelling    Swelling of hands and feet., non shellfish allergy  . Other Anaphylaxis    Tree nuts   . Ace  Inhibitors Other (See Comments) and Cough    Cold like symptoms   . Tylenol [Acetaminophen] Other (See Comments)    Headache     Current Outpatient Medications  Medication Sig Dispense Refill  . ADVAIR DISKUS 250-50 MCG/DOSE AEPB INL 1 PUFF ITL Q 12 H    . albuterol (PROAIR HFA) 108 (90 Base) MCG/ACT inhaler INHALE 2 PUFFS BY MOUTH INTO THE LUNGS EVERY 6 HOURS AS NEEDED FOR WHEEZING    . Alirocumab (PRALUENT) 75 MG/ML SOPN Inject 75 mg into the skin every 14 (fourteen) days. 2 pen 11  . amLODipine (NORVASC) 10 MG tablet Take 10 mg by mouth daily.     Marland Kitchen anastrozole (ARIMIDEX) 1 MG tablet TAKE 1 TABLET(1 MG) BY MOUTH DAILY 90 tablet 4  . aspirin EC 81 MG tablet Take 1 tablet (81 mg total) by mouth daily. 90 tablet 3  . cetirizine (ZYRTEC) 10 MG tablet Take 10 mg by mouth daily as needed for allergies.   11  . levothyroxine (SYNTHROID, LEVOTHROID) 50 MCG tablet Take 50 mcg by mouth daily.    Marland Kitchen lidocaine-prilocaine (EMLA) cream Apply 1 application topically as needed (port access). 30 g 0  . losartan-hydrochlorothiazide (HYZAAR) 100-12.5 MG tablet Take 1 tablet by mouth daily.   3  . metoprolol succinate (TOPROL-XL) 100 MG 24 hr tablet Take 1 tablet (100 mg total) by mouth daily. 30 tablet 4  . naproxen (NAPROSYN) 500 MG tablet TAKE 1 TABLET(500 MG) BY MOUTH TWICE DAILY WITH MEALS FOR BACK OR LEG PAIN    . nicotine (NICODERM CQ - DOSED IN MG/24 HOURS) 14 mg/24hr patch Place 1 patch (14 mg total) onto the skin daily. 28 patch 0  . nitroGLYCERIN (NITROSTAT) 0.4 MG SL tablet Place 1 tablet (0.4 mg total) under the tongue every 5 (five) minutes as needed for chest pain. 75 tablet 3  . potassium chloride (KLOR-CON) 10 MEQ tablet Take 1 tablet (10 mEq total) by mouth daily. 90 tablet 1  . PRALUENT 75 MG/ML SOAJ INJECT 75 MG INTO THE SKIN EVERY 14 DAYS 2 mL 0   No current facility-administered medications for this visit.    OBJECTIVE: Middle-aged African-American woman in no acute distress  There  were no vitals filed for this visit.   There is no height or weight on file to calculate BMI.   Wt Readings from Last 3 Encounters:  12/07/19 169 lb 9.6 oz (76.9 kg)  11/21/19 172 lb (78  kg)  11/16/19 172 lb 9.6 oz (78.3 kg)   ECOG FS:1   Sclerae unicteric, EOMs intact Wearing a mask No cervical or supraclavicular adenopathy Lungs no rales or rhonchi Heart regular rate and rhythm Abd soft, nontender, positive bowel sounds MSK no focal spinal tenderness, no upper extremity lymphedema Neuro: nonfocal, well oriented, appropriate affect Breasts: Deferred   LAB RESULTS:  CMP     Component Value Date/Time   NA 141 12/16/2019 1005   NA 138 12/24/2017 0752   K 3.3 (L) 12/16/2019 1005   K 3.3 (L) 12/24/2017 0752   CL 104 12/16/2019 1005   CO2 26 12/16/2019 1005   CO2 25 12/24/2017 0752   GLUCOSE 107 (H) 12/16/2019 1005   GLUCOSE 99 12/24/2017 0752   BUN 9 12/16/2019 1005   BUN 7.9 12/24/2017 0752   CREATININE 0.68 12/16/2019 1005   CREATININE 0.7 12/24/2017 0752   CALCIUM 8.8 (L) 12/16/2019 1005   CALCIUM 9.2 12/24/2017 0752   PROT 7.0 12/16/2019 1005   PROT 6.3 (L) 12/24/2017 0752   ALBUMIN 3.2 (L) 12/16/2019 1005   ALBUMIN 3.4 (L) 12/24/2017 0752   AST 11 (L) 12/16/2019 1005   AST 15 12/24/2017 0752   ALT 8 12/16/2019 1005   ALT 18 12/24/2017 0752   ALKPHOS 144 (H) 12/16/2019 1005   ALKPHOS 93 12/24/2017 0752   BILITOT <0.2 (L) 12/16/2019 1005   BILITOT <0.22 12/24/2017 0752   GFRNONAA >60 12/16/2019 1005   GFRAA >60 12/16/2019 1005    No results found for: TOTALPROTELP, ALBUMINELP, A1GS, A2GS, BETS, BETA2SER, GAMS, MSPIKE, SPEI  No results found for: KPAFRELGTCHN, LAMBDASER, [facility]  Lab Results  Component Value Date   WBC 5.2 12/16/2019   NEUTROABS 3.4 12/16/2019   HGB 9.2 (L) 12/16/2019   HCT 29.3 (L) 12/16/2019   MCV 84.7 12/16/2019   PLT 413 (H) 12/16/2019      Chemistry      Component Value Date/Time   NA 141 12/16/2019 1005   NA 138  12/24/2017 0752   K 3.3 (L) 12/16/2019 1005   K 3.3 (L) 12/24/2017 0752   CL 104 12/16/2019 1005   CO2 26 12/16/2019 1005   CO2 25 12/24/2017 0752   BUN 9 12/16/2019 1005   BUN 7.9 12/24/2017 0752   CREATININE 0.68 12/16/2019 1005   CREATININE 0.7 12/24/2017 0752      Component Value Date/Time   CALCIUM 8.8 (L) 12/16/2019 1005   CALCIUM 9.2 12/24/2017 0752   ALKPHOS 144 (H) 12/16/2019 1005   ALKPHOS 93 12/24/2017 0752   AST 11 (L) 12/16/2019 1005   AST 15 12/24/2017 0752   ALT 8 12/16/2019 1005   ALT 18 12/24/2017 0752   BILITOT <0.2 (L) 12/16/2019 1005   BILITOT <0.22 12/24/2017 0752       No results found for: LABCA2  No components found for: [id number]    No visits with results within 3 Day(s) from this visit.  Latest known visit with results is:  Appointment on 12/16/2019  Component Date Value Ref Range Status  . CA 27.29 12/16/2019 263.4* 0.0 - 38.6 U/mL Final   Comment: (NOTE) Siemens Centaur Immunochemiluminometric Methodology [facility]) Values obtained with different assay methods or kits cannot be used interchangeably. Results cannot be interpreted as absolute evidence of the presence or absence of malignant disease. Performed At: [facility] [place], [place] [id number] Rush Farmer MD [id number]   . Sodium 12/16/2019 141  135 - 145 mmol/L Final  .  Potassium 12/16/2019 3.3* 3.5 - 5.1 mmol/L Final  . Chloride 12/16/2019 104  98 - 111 mmol/L Final  . CO2 12/16/2019 26  22 - 32 mmol/L Final  . Glucose, Bld 12/16/2019 107* 70 - 99 mg/dL Final  . BUN 12/16/2019 9  8 - 23 mg/dL Final  . Creatinine 12/16/2019 0.68  0.44 - 1.00 mg/dL Final  . Calcium 12/16/2019 8.8* 8.9 - 10.3 mg/dL Final  . Total Protein 12/16/2019 7.0  6.5 - 8.1 g/dL Final  . Albumin 12/16/2019 3.2* 3.5 - 5.0 g/dL Final  . AST 12/16/2019 11* 15 - 41 U/L Final  . ALT 12/16/2019 8  0 - 44 U/L Final  . Alkaline Phosphatase 12/16/2019 144* 38 - 126 U/L Final  . Total  Bilirubin 12/16/2019 <0.2* 0.3 - 1.2 mg/dL Final  . GFR, Est Non Af Am 12/16/2019 >60  >60 mL/min Final  . GFR, Est AFR Am 12/16/2019 >60  >60 mL/min Final  . Anion gap 12/16/2019 11  5 - 15 Final   Performed at [facility], [place] [address], [place], [place] [address]  . WBC Count 12/16/2019 5.2  4.0 - 10.5 K/uL Final  . RBC 12/16/2019 3.46* 3.87 - 5.11 MIL/uL Final  . Hemoglobin 12/16/2019 9.2* 12.0 - 15.0 g/dL Final  . HCT 12/16/2019 29.3* 36.0 - 46.0 % Final  . MCV 12/16/2019 84.7  80.0 - 100.0 fL Final  . MCH 12/16/2019 26.6  26.0 - 34.0 pg Final  . MCHC 12/16/2019 31.4  30.0 - 36.0 g/dL Final  . RDW 12/16/2019 18.0* 11.5 - 15.5 % Final  . Platelet Count 12/16/2019 413* 150 - 400 K/uL Final  . nRBC 12/16/2019 0.0  0.0 - 0.2 % Final  . Neutrophils Relative % 12/16/2019 66  % Final  . Neutro Abs 12/16/2019 3.4  1.7 - 7.7 K/uL Final  . Lymphocytes Relative 12/16/2019 17  % Final  . Lymphs Abs 12/16/2019 0.9  0.7 - 4.0 K/uL Final  . Monocytes Relative 12/16/2019 12  % Final  . Monocytes Absolute 12/16/2019 0.6  0.1 - 1.0 K/uL Final  . Eosinophils Relative 12/16/2019 4  % Final  . Eosinophils Absolute 12/16/2019 0.2  0.0 - 0.5 K/uL Final  . Basophils Relative 12/16/2019 1  % Final  . Basophils Absolute 12/16/2019 0.0  0.0 - 0.1 K/uL Final  . Immature Granulocytes 12/16/2019 0  % Final  . Abs Immature Granulocytes 12/16/2019 0.02  0.00 - 0.07 K/uL Final   Performed at [facility], [address] [place], [place] [address]    (this displays the last labs from the last 3 days)  No results found for: TOTALPROTELP, ALBUMINELP, A1GS, A2GS, BETS, BETA2SER, GAMS, MSPIKE, SPEI (this displays SPEP labs)  No results found for: KPAFRELGTCHN, LAMBDASER, KAPLAMBRATIO (kappa/lambda light chains)  No results found for: HGBA, HGBA2QUANT, HGBFQUANT, HGBSQUAN (Hemoglobinopathy evaluation)   No results found for: LDH  No results found for:  IRON, TIBC, IRONPCTSAT (Iron and TIBC)  No results found for: FERRITIN  Urinalysis    Component Value Date/Time   LABSPEC 1.020 02/10/2011 2058   PHURINE 6.0 02/10/2011 2058   HGBUR LARGE (A) 02/10/2011 2058   BILIRUBINUR MODERATE (A) 02/10/2011 2058   KETONESUR 15 (A) 02/10/2011 2058   PROTEINUR >=300 (A) 02/10/2011 2058   UROBILINOGEN >=8.0 02/10/2011 2058   NITRITE NEGATIVE 02/10/2011 2058   LEUKOCYTESUR  02/10/2011 2058    NEGATIVE Biochemical Testing Only. Please order routine urinalysis from main lab  if confirmatory testing is needed.     STUDIES: MR LIVER W WO CONTRAST  Result Date: 12/12/2019 CLINICAL DATA:  Breast cancer. Evaluate for metastasis. Metastasis suspected. EXAM: MRI ABDOMEN WITHOUT AND WITH CONTRAST TECHNIQUE: Multiplanar multisequence MR imaging of the abdomen was performed both before and after the administration of intravenous contrast. CONTRAST:  9m GADAVIST GADOBUTROL 1 MMOL/ML IV SOLN COMPARISON:  Chest CT 12/05/2019.  Bone scan 12/05/2019 FINDINGS: Lower chest: Mild cardiomegaly, without pericardial or pleural effusion. Hepatobiliary: Multiple hepatic metastasis, the majority of which are centered in segment 4. Dominant segment 4 B mass measures 5.1 x 4.3 cm on 21/4. Index segment 4A lesion measures 1.7 cm on 11/04. Multiple smaller lateral segment left liver lobe lesions, including a segment 2 1.2 cm lesion on 12/04. Normal gallbladder, without biliary ductal dilatation. Pancreas:  Normal, without mass or ductal dilatation. Spleen:  Normal in size, without focal abnormality. Adrenals/Urinary Tract: Bilateral adrenal thickening and nodularity, similar back to 07/30/2018, and decreased in signal on out of phase imaging, favoring hyperplasia and small adenomas. Normal left kidney. Too small to characterize right renal lesion. No hydronephrosis. Stomach/Bowel: Normal stomach and abdominal bowel loops. Vascular/Lymphatic: Aortic atherosclerosis. No abdominal  adenopathy. Other:  No ascites.  No evidence of omental or peritoneal disease. Musculoskeletal: No acute osseous abnormality. IMPRESSION: 1. Moderate volume hepatic metastasis, primarily within segment 4 as detailed above. 2. No extrahepatic abdominal metastasis identified. 3.  Aortic Atherosclerosis (ICD10-I70.0). Electronically Signed   By: KAbigail MiyamotoM.D.   On: 12/12/2019 13:40   [place]BIOPSY (LIVER)  Result Date: 12/14/2019 CLINICAL DATA:  History of breast carcinoma. New liver lesions. Biopsy requested EXAM: ULTRASOUND-GUIDED CORE LIVER BIOPSY TECHNIQUE: An ultrasound guided liver biopsy was thoroughly discussed with the patient and questions were answered. The benefits, risks, alternatives, and complications were also discussed. The patient understands and wishes to proceed with the procedure. A verbal as well as written consent was obtained. Survey ultrasound of the liver was performed, representative lesion localized, and an appropriate skin entry site was determined. Skin site was marked, prepped with chlorhexidine, and draped in usual sterile fashion, and infiltrated locally with 1% lidocaine. Intravenous Fentanyl 1078m and Versed 65m40mere administered as conscious sedation during continuous monitoring of the patient's level of consciousness and physiological / cardiorespiratory status by the radiology RN, with a total moderate sedation time of 10 minutes. a 17 31uge trocar needle was advanced under ultrasound guidance into the liver to the margin of the lesion. 3 coaxial 18gauge core samples were then obtained through the guide needle. The guide needle was removed. Post procedure scans demonstrate no apparent complication. COMPLICATIONS: COMPLICATIONS None immediate FINDINGS: Liver lesions are localized corresponding to previous MR and CT findings. Representative core biopsy samples obtained as above. IMPRESSION: 1. Technically successful ultrasound guided core liver lesion biopsy. Electronically  Signed   By: D  Lucrezia EuropeD.   On: 12/14/2019 15:49    ELIGIBLE FOR AVAILABLE RESEARCH PROTOCOL: no   ASSESSMENT: [age]. [place] woman status post right breast upper outer quadrant and right axillary lymph node biopsy 09/03/2017, both positive for invasive ductal carcinoma, grade 3, estrogen receptor positive, progesterone receptor negative, with an MIB-1 of 90%, the lymph node being HER-2 positive, the breast mass HER-2 negative  (a) staging studies September 24, 2017 show a lytic lesion in T8, possible areas of concern at L1-L2  (b) biopsy/kyphoplasty/osteocool of T8 lesion 11/02/2017 confirms metastatic adenocarcinoma, 20% estrogen receptor positive, progesterone receptor negative  (c) PET scan 11/30/2017 shows  no visceral disease, minimal metabolic activity at T8, no other bone lesions  (d) CT of the chest 12/05/2019 shows new lung and liver lesions; bone scan was negative  (1) neoadjuvant chemotherapy with carboplatin, docetaxel, trastuzumab, and pertuzumab started 10/01/2017, completed 6 cycles 01/26/2018  (a) Docetaxel dropped after three cycles and Gemcitabine/Carbo started on 12/15/2017.   (2) trastuzumab to continue indefinitely  (a) echocardiogram 11/38/2018 shows an ejection fraction in the 55-60% range  (b) echocardiogram 06/17/2019 shows an ejection fraction in the 50-55% range  (c) echocardiogram 11/21/2019 shows an ejection fraction in the 50% range, which is mildly decreased.  (d) trastuzumab discontinued after 01/15/2019 dose with disease progression  (3) right lumpectomy and sentinel lymph node sampling 03/08/2018  Showed a pT1(mic) pN0 invasive ductal carcinoma, grade 2, with negative margins.  (4) adjuvant radiation  04-07-18 to 05-11-18                                              (a) SITE/DOSE:  Right Breast and Axilla, SCV nodes / 50 Gy in 25 fx               (b) radiation to her oligo metastatic disease at T8 (18 Gy) in 1 fraction, 04/21/2018  (5) anastrozole  started 05/29/2018  (a) bone density 07/19/2019 shows a T score of -0.9, which is normal.  (6) zolendronate started 01/19/2019, repeated every 12 weeks   (7) to start T-DM1 12/14/2019  PLAN:  Tracie [name]'s b   Wilber Bihari, NP 01/06/20 7:36 AM Medical Oncology and Hematology [facility] [place], [place] [address] Tel. [phone]    Fax. [phone]

## 2020-01-10 ENCOUNTER — Telehealth: Payer: Self-pay | Admitting: Adult Health

## 2020-01-10 NOTE — Telephone Encounter (Signed)
Scheduled appt per 1/8 sch message  - called to confirm appt - pt aware of appt on 1/13

## 2020-01-11 ENCOUNTER — Inpatient Hospital Stay (HOSPITAL_BASED_OUTPATIENT_CLINIC_OR_DEPARTMENT_OTHER): Payer: Medicare Other | Admitting: Adult Health

## 2020-01-11 ENCOUNTER — Encounter: Payer: Self-pay | Admitting: Adult Health

## 2020-01-11 ENCOUNTER — Other Ambulatory Visit: Payer: Self-pay

## 2020-01-11 ENCOUNTER — Telehealth: Payer: Self-pay

## 2020-01-11 ENCOUNTER — Inpatient Hospital Stay: Payer: Medicare Other

## 2020-01-11 ENCOUNTER — Encounter: Payer: Self-pay | Admitting: *Deleted

## 2020-01-11 ENCOUNTER — Inpatient Hospital Stay: Payer: Medicare Other | Attending: Adult Health

## 2020-01-11 VITALS — BP 128/61 | HR 78 | Temp 98.2°F | Resp 16 | Ht 63.0 in | Wt 167.4 lb

## 2020-01-11 DIAGNOSIS — Z95828 Presence of other vascular implants and grafts: Secondary | ICD-10-CM

## 2020-01-11 DIAGNOSIS — C7800 Secondary malignant neoplasm of unspecified lung: Secondary | ICD-10-CM | POA: Diagnosis not present

## 2020-01-11 DIAGNOSIS — C7951 Secondary malignant neoplasm of bone: Secondary | ICD-10-CM

## 2020-01-11 DIAGNOSIS — Z170 Estrogen receptor positive status [ER+]: Secondary | ICD-10-CM

## 2020-01-11 DIAGNOSIS — C50411 Malignant neoplasm of upper-outer quadrant of right female breast: Secondary | ICD-10-CM

## 2020-01-11 DIAGNOSIS — C787 Secondary malignant neoplasm of liver and intrahepatic bile duct: Secondary | ICD-10-CM

## 2020-01-11 DIAGNOSIS — Z5112 Encounter for antineoplastic immunotherapy: Secondary | ICD-10-CM | POA: Diagnosis present

## 2020-01-11 DIAGNOSIS — Z79899 Other long term (current) drug therapy: Secondary | ICD-10-CM | POA: Diagnosis not present

## 2020-01-11 LAB — CMP (CANCER CENTER ONLY)
ALT: 10 U/L (ref 0–44)
AST: 12 U/L — ABNORMAL LOW (ref 15–41)
Albumin: 3.1 g/dL — ABNORMAL LOW (ref 3.5–5.0)
Alkaline Phosphatase: 208 U/L — ABNORMAL HIGH (ref 38–126)
Anion gap: 11 (ref 5–15)
BUN: 15 mg/dL (ref 8–23)
CO2: 26 mmol/L (ref 22–32)
Calcium: 9.6 mg/dL (ref 8.9–10.3)
Chloride: 103 mmol/L (ref 98–111)
Creatinine: 1.03 mg/dL — ABNORMAL HIGH (ref 0.44–1.00)
GFR, Est AFR Am: >60 mL/min (ref >60)
GFR, Estimated: 55 mL/min — ABNORMAL LOW (ref >60)
Glucose, Bld: 111 mg/dL — ABNORMAL HIGH (ref 70–99)
Potassium: 4.1 mmol/L (ref 3.5–5.1)
Sodium: 140 mmol/L (ref 135–145)
Total Bilirubin: <0.2 mg/dL — ABNORMAL LOW (ref 0.3–1.2)
Total Protein: 7.6 g/dL (ref 6.5–8.1)

## 2020-01-11 LAB — CBC WITH DIFFERENTIAL (CANCER CENTER ONLY)
Abs Immature Granulocytes: 0.03 10*3/uL (ref 0.00–0.07)
Basophils Absolute: 0 10*3/uL (ref 0.0–0.1)
Basophils Relative: 1 %
Eosinophils Absolute: 0.3 10*3/uL (ref 0.0–0.5)
Eosinophils Relative: 3 %
HCT: 31.9 % — ABNORMAL LOW (ref 36.0–46.0)
Hemoglobin: 9.9 g/dL — ABNORMAL LOW (ref 12.0–15.0)
Immature Granulocytes: 0 %
Lymphocytes Relative: 12 %
Lymphs Abs: 0.9 10*3/uL (ref 0.7–4.0)
MCH: 25.1 pg — ABNORMAL LOW (ref 26.0–34.0)
MCHC: 31 g/dL (ref 30.0–36.0)
MCV: 81 fL (ref 80.0–100.0)
Monocytes Absolute: 0.9 10*3/uL (ref 0.1–1.0)
Monocytes Relative: 12 %
Neutro Abs: 5.4 10*3/uL (ref 1.7–7.7)
Neutrophils Relative %: 72 %
Platelet Count: 496 10*3/uL — ABNORMAL HIGH (ref 150–400)
RBC: 3.94 MIL/uL (ref 3.87–5.11)
RDW: 18.9 % — ABNORMAL HIGH (ref 11.5–15.5)
WBC Count: 7.6 10*3/uL (ref 4.0–10.5)
nRBC: 0 % (ref 0.0–0.2)

## 2020-01-11 MED ORDER — SODIUM CHLORIDE 0.9 % IV SOLN
3.8000 mg/kg | Freq: Once | INTRAVENOUS | Status: AC
  Administered 2020-01-11: 300 mg via INTRAVENOUS
  Filled 2020-01-11: qty 15

## 2020-01-11 MED ORDER — DIPHENHYDRAMINE HCL 25 MG PO CAPS
ORAL_CAPSULE | ORAL | Status: AC
  Filled 2020-01-11: qty 1

## 2020-01-11 MED ORDER — SODIUM CHLORIDE 0.9% FLUSH
10.0000 mL | INTRAVENOUS | Status: DC | PRN
  Administered 2020-01-11: 10 mL
  Filled 2020-01-11: qty 10

## 2020-01-11 MED ORDER — DIPHENHYDRAMINE HCL 25 MG PO CAPS
25.0000 mg | ORAL_CAPSULE | Freq: Once | ORAL | Status: AC
  Administered 2020-01-11: 25 mg via ORAL

## 2020-01-11 MED ORDER — SODIUM CHLORIDE 0.9% FLUSH
10.0000 mL | Freq: Once | INTRAVENOUS | Status: AC
  Administered 2020-01-11: 10 mL
  Filled 2020-01-11: qty 10

## 2020-01-11 MED ORDER — ACETAMINOPHEN 325 MG PO TABS
650.0000 mg | ORAL_TABLET | Freq: Once | ORAL | Status: AC
  Administered 2020-01-11: 650 mg via ORAL

## 2020-01-11 MED ORDER — ACETAMINOPHEN 325 MG PO TABS
ORAL_TABLET | ORAL | Status: AC
  Filled 2020-01-11: qty 2

## 2020-01-11 MED ORDER — HEPARIN SOD (PORK) LOCK FLUSH 100 UNIT/ML IV SOLN
500.0000 [IU] | Freq: Once | INTRAVENOUS | Status: AC | PRN
  Administered 2020-01-11: 500 [IU]
  Filled 2020-01-11: qty 5

## 2020-01-11 MED ORDER — SODIUM CHLORIDE 0.9 % IV SOLN
Freq: Once | INTRAVENOUS | Status: AC
  Filled 2020-01-11: qty 250

## 2020-01-11 NOTE — Progress Notes (Signed)
Tracie [name]  Telephone:([phone]) [phone] Fax:(336) [phone]     ID: AMYRI FRENZ DOB: 20-Jan-1951  MR#: [id number]  CBS#:[id number]  Patient Care Team: Bartholome Bill, MD as PCP - General (Family Medicine) [name], MD as Consulting Physician (General Surgery) Magrinat, [name], MD as Consulting Physician (Oncology) [name], MD as Attending Physician (Radiation Oncology) Mottinger, [place] DDS (Physical Therapy) [name], MD as Consulting Physician (Cardiology) [name], [name], NP as Nurse Practitioner (Hematology and Oncology) OTHER MD:   CHIEF COMPLAINT: Triple positive breast cancer  CURRENT TREATMENT: TDM-1   INTERVAL HISTORY: [name] returns today for follow-up and treatment of her triple positive breast cancer. She had disease progression that was noted on recent scans and biopsy.  She notes that she does not want to know anything about her scans and biopsy because she doesn't want to be fearful.    [name] was started on TDM-1 on 12/17/2019.  She was due to come last week when we had inclimate weather, so she opted to reschedule to today.  She says that she is feeling quite well today and she tolerated her treatment well.  She has some mild dryness of her hands and fingertips, but has no other issues related to her treatment that she noted.    Tracie [name] is seen by Dr. Aundra Dubin regularly.  She last saw him in November with an echo that noted an EF of 50%.  She is due for a repeat echo in 02/2020 with f/u with him after.    Tracie [name] notes she has continued on Anastrozole with good tolerance.  She has no arthralgias, hot flashes or vaginal dryness due to this.  REVIEW OF SYSTEMS:  Kyliana is without fever, chills, chest pain, palpitations, cough, shortness of breath, nausea, vomiting, headaches, bowel/bladder changes, pain, or any other concerns.  A detailed ROS was otherwise non contributory.     HISTORY OF CURRENT ILLNESS: From  the original intake note:  Tracie [name] noted a change in her right breast sometime in July 2018. [facility] to report that she had found a "kernel" in her breast. She was advised to see her primary physician which she did. The patient was then set up for bilateral diagnostic mammography with tomography and right breast ultrasonography at [facility] 09/03/2017. The breast density was category I a. In the upper outer quadrant of the right breast there was a 2.1 cm high density mass, which was palpable. Also in the right breast more posteriorly there was a 1.5 cm high density mass which was felt to be a suspicious lymph node. Ultrasound of the right breast confirmed a 2.1 centimeter lobulated solid mass in the right breast upper outer quadrant 15.8 cm from the nipple in the 10:00 radiant. There was a 1.5 cm oval mass in the right axillary tail with a small rounded masses also suspicious for lymph node involvement. A total of 3 suspicious lymph nodes were identified.  On 09/03/2017 the patient underwent biopsy of the breast mass and the suspicious right axillary lymph node. The final pathology ([id number]) found invasive ductal carcinoma, grade 3, in both. Both tumors were estrogen receptor positive at 70-75%, and both were progesterone receptor negative. Both had an elevated proliferation marker at 90%. The mass in the breast was HER-2 negative, with a signals ratio of 1.25 and the number per cell 1.88. The lymph node mass however was HER-2 positive with a signals ratio of 2.57, and the number per cell 3.60.  The patient's subsequent history is as detailed below.   PAST MEDICAL HISTORY: Past Medical History:  Diagnosis Date  . Angina pectoris ([place])    history of   . Arthritis   . Asthma   . Cancer ([place])    right breast  . Constipation   . Diverticulitis   . History of radiation therapy 04/07/18- 05/11/18   50 Gy in 25 fractions to right breast and regional nodes with four fields (no boost)   . History of radiation therapy 04/21/2018   T8 spine, 18 Gy in 1 fraction for a total dose of 18 Gy.   Marland Kitchen HPV in female   . Hyperlipidemia   . Hypertension   . Hypothyroidism   . Wears dentures   . Wears glasses     PAST SURGICAL HISTORY: Past Surgical History:  Procedure Laterality Date  . BREAST LUMPECTOMY WITH RADIOACTIVE SEED AND SENTINEL LYMPH NODE BIOPSY Right 03/08/2018   Procedure: RIGHT BREAST RADIOACTIVE SEED GUIDED LUMPECTOMY WITH RIGHT AXILLARY RADIOACTIVE SEED TARGETED LYMPH NODE EXCISION AND SENTINEL LYMPH NODE BIOPSY, INJECT BLUE DYE RIGHT BREAST;  Surgeon: [name], MD;  Location: [facility];  Service: General;  Laterality: Right;  . CESAREAN SECTION    . COLON SURGERY  2012   sigmoidectomy  . COLOSTOMY    . COLOSTOMY TAKEDOWN  10/06/11  . IR BONE TUMOR(S)RF ABLATION  11/02/2017  . IR KYPHO THORACIC WITH BONE BIOPSY  11/02/2017  . IR RADIOLOGIST EVAL & MGMT  10/07/2017  . IR RADIOLOGIST EVAL & MGMT  11/25/2017  . PORTACATH PLACEMENT N/A 09/25/2017   Procedure: INSERTION PORT-A-CATH WITH ULTRA SOUND ERAS PATHWAY;  Surgeon: [name], MD;  Location: [facility];  Service: General;  Laterality: N/A;  . TONSILLECTOMY    . TUBAL LIGATION      FAMILY HISTORY: Family History  Problem Relation Age of Onset  . Cancer Mother        rectal  . Hypertension Brother   . Hypertension Brother   . Asthma Son   . Asthma Brother    The patient has little information regarding her father. Her mother died at age 76 from a strange cancer--the patient does not know what it was, it may well have been cervical cancer from her description. The patient has one brother, no sisters. There is no history of breast or ovarian cancer in the family to the patient's knowledge    GYNECOLOGIC HISTORY:  No LMP recorded. Patient is postmenopausal.  menarche age 1, first live birth age 79 the patient is [place] P3. She stopped having periods at age 2. She never used oral contraceptives or hormone  replacement.    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  She is divorced.  Her son Tracie [name] stays with her sometimes. He is a Art gallery manager and therefore not working during the pandemic. The 2 other children are LAYLANA GERWIG, who lives in [place] and works in the theater and media, and Kelena Garrow lives in [place], and is vice president of a [employer]. The patient has 5 grandchildren. She is a Psychologist, forensic.   ADVANCED DIRECTIVES: Not in place    HEALTH MAINTENANCE: Social History   Tobacco Use  . Smoking status: Current Some Day Smoker    Packs/day: 0.25    Years: 40.00    Pack years: 10.00    Types: Cigarettes  . Smokeless tobacco: Never Used  . Tobacco comment: she  is smoking 2 or more daily, she plans to use nicotine patch.   Substance Use Topics  . Alcohol use: Yes    Comment: occasional  . Drug use: No    Colonoscopy: September 2012   PAP:  Bone density:09/03/2017    Allergies  Allergen Reactions  . Fish Allergy Anaphylaxis and Swelling    Swelling of hands and feet., non shellfish allergy  . Other Anaphylaxis    Tree nuts   . Ace Inhibitors Other (See Comments) and Cough    Cold like symptoms   . Tylenol [Acetaminophen] Other (See Comments)    Headache     Current Outpatient Medications  Medication Sig Dispense Refill  . ADVAIR DISKUS 250-50 MCG/DOSE AEPB INL 1 PUFF ITL Q 12 H    . albuterol (PROAIR HFA) 108 (90 Base) MCG/ACT inhaler INHALE 2 PUFFS BY MOUTH INTO THE LUNGS EVERY 6 HOURS AS NEEDED FOR WHEEZING    . amLODipine (NORVASC) 10 MG tablet Take 10 mg by mouth daily.     Marland Kitchen anastrozole (ARIMIDEX) 1 MG tablet TAKE 1 TABLET(1 MG) BY MOUTH DAILY 90 tablet 4  . aspirin EC 81 MG tablet Take 1 tablet (81 mg total) by mouth daily. 90 tablet 3  . cetirizine (ZYRTEC) 10 MG tablet Take 10 mg by mouth daily as needed for allergies.   11  . levothyroxine (SYNTHROID, LEVOTHROID) 50  MCG tablet Take 50 mcg by mouth daily.    Marland Kitchen lidocaine-prilocaine (EMLA) cream Apply 1 application topically as needed (port access). 30 g 0  . metoprolol succinate (TOPROL-XL) 100 MG 24 hr tablet Take 1 tablet (100 mg total) by mouth daily. 30 tablet 4  . naproxen (NAPROSYN) 500 MG tablet TAKE 1 TABLET(500 MG) BY MOUTH TWICE DAILY WITH MEALS FOR BACK OR LEG PAIN    . nicotine (NICODERM CQ - DOSED IN MG/24 HOURS) 14 mg/24hr patch Place 1 patch (14 mg total) onto the skin daily. 28 patch 0  . nitroGLYCERIN (NITROSTAT) 0.4 MG SL tablet Place 1 tablet (0.4 mg total) under the tongue every 5 (five) minutes as needed for chest pain. 75 tablet 3  . potassium chloride (KLOR-CON) 10 MEQ tablet Take 1 tablet (10 mEq total) by mouth daily. 90 tablet 1  . PRALUENT 75 MG/ML SOAJ INJECT 75 MG INTO THE SKIN EVERY 14 DAYS 2 mL 0  . losartan-hydrochlorothiazide (HYZAAR) 100-12.5 MG tablet Take 1 tablet by mouth daily.   3   No current facility-administered medications for this visit.   Facility-Administered Medications Ordered in Other Visits  Medication Dose Route Frequency Provider Last Rate Last Admin  . ado-trastuzumab emtansine (KADCYLA) 300 mg in sodium chloride 0.9 % 250 mL chemo infusion  3.8 mg/kg (Treatment Plan Recorded) Intravenous Once Magrinat, [name], MD      . heparin lock flush 100 unit/mL  500 Units Intracatheter Once PRN Magrinat, [name], MD      . sodium chloride flush (NS) 0.9 % injection 10 mL  10 mL Intracatheter PRN Magrinat, [name], MD        OBJECTIVE:  Vitals:   01/11/20 1348  BP: 128/61  Pulse: 78  Resp: 16  Temp: 98.2 F (36.8 C)  SpO2: 98%     Body mass index is 29.65 kg/m.   Wt Readings from Last 3 Encounters:  01/11/20 167 lb 6.4 oz (75.9 kg)  12/07/19 169 lb 9.6 oz (76.9 kg)  11/21/19 172 lb (78 kg)   ECOG FS:1  GENERAL: Patient  is a well appearing female in no acute distress HEENT:  Sclerae anicteric.  Mask in place. Neck is supple.  NODES:  No cervical,  supraclavicular, or axillary lymphadenopathy palpated.  BREAST EXAM:  Deferred. LUNGS:  Clear to auscultation bilaterally.  No wheezes or rhonchi. HEART:  Regular rate and rhythm. No murmur appreciated. ABDOMEN:  Soft, nontender.  Positive, normoactive bowel sounds. No organomegaly palpated. MSK:  No focal spinal tenderness to palpation. Full range of motion bilaterally in the upper extremities. EXTREMITIES:  No peripheral edema.   SKIN:  Clear with no obvious rashes or skin changes. No nail dyscrasia. NEURO:  Nonfocal. Well oriented.  Appropriate affect.     LAB RESULTS:  CMP     Component Value Date/Time   NA 140 01/11/2020 1331   NA 138 12/24/2017 0752   K 4.1 01/11/2020 1331   K 3.3 (L) 12/24/2017 0752   CL 103 01/11/2020 1331   CO2 26 01/11/2020 1331   CO2 25 12/24/2017 0752   GLUCOSE 111 (H) 01/11/2020 1331   GLUCOSE 99 12/24/2017 0752   BUN 15 01/11/2020 1331   BUN 7.9 12/24/2017 0752   CREATININE 1.03 (H) 01/11/2020 1331   CREATININE 0.7 12/24/2017 0752   CALCIUM 9.6 01/11/2020 1331   CALCIUM 9.2 12/24/2017 0752   PROT 7.6 01/11/2020 1331   PROT 6.3 (L) 12/24/2017 0752   ALBUMIN 3.1 (L) 01/11/2020 1331   ALBUMIN 3.4 (L) 12/24/2017 0752   AST 12 (L) 01/11/2020 1331   AST 15 12/24/2017 0752   ALT 10 01/11/2020 1331   ALT 18 12/24/2017 0752   ALKPHOS 208 (H) 01/11/2020 1331   ALKPHOS 93 12/24/2017 0752   BILITOT <0.2 (L) 01/11/2020 1331   BILITOT <0.22 12/24/2017 0752   GFRNONAA 55 (L) 01/11/2020 1331   GFRAA >60 01/11/2020 1331    No results found for: TOTALPROTELP, ALBUMINELP, A1GS, A2GS, BETS, BETA2SER, GAMS, MSPIKE, SPEI  No results found for: Nils Pyle, KAPLAMBRATIO  Lab Results  Component Value Date   WBC 7.6 01/11/2020   NEUTROABS 5.4 01/11/2020   HGB 9.9 (L) 01/11/2020   HCT 31.9 (L) 01/11/2020   MCV 81.0 01/11/2020   PLT 496 (H) 01/11/2020      Chemistry      Component Value Date/Time   NA 140 01/11/2020 1331   NA 138  12/24/2017 0752   K 4.1 01/11/2020 1331   K 3.3 (L) 12/24/2017 0752   CL 103 01/11/2020 1331   CO2 26 01/11/2020 1331   CO2 25 12/24/2017 0752   BUN 15 01/11/2020 1331   BUN 7.9 12/24/2017 0752   CREATININE 1.03 (H) 01/11/2020 1331   CREATININE 0.7 12/24/2017 0752      Component Value Date/Time   CALCIUM 9.6 01/11/2020 1331   CALCIUM 9.2 12/24/2017 0752   ALKPHOS 208 (H) 01/11/2020 1331   ALKPHOS 93 12/24/2017 0752   AST 12 (L) 01/11/2020 1331   AST 15 12/24/2017 0752   ALT 10 01/11/2020 1331   ALT 18 12/24/2017 0752   BILITOT <0.2 (L) 01/11/2020 1331   BILITOT <0.22 12/24/2017 0752       No results found for: LABCA2  No components found for: [id number]    Appointment on 01/11/2020  Component Date Value Ref Range Status  . WBC Count 01/11/2020 7.6  4.0 - 10.5 K/uL Final  . RBC 01/11/2020 3.94  3.87 - 5.11 MIL/uL Final  . Hemoglobin 01/11/2020 9.9* 12.0 - 15.0 g/dL Final  . HCT 01/11/2020 31.9* 36.0 -  46.0 % Final  . MCV 01/11/2020 81.0  80.0 - 100.0 fL Final  . MCH 01/11/2020 25.1* 26.0 - 34.0 pg Final  . MCHC 01/11/2020 31.0  30.0 - 36.0 g/dL Final  . RDW 01/11/2020 18.9* 11.5 - 15.5 % Final  . Platelet Count 01/11/2020 496* 150 - 400 K/uL Final  . nRBC 01/11/2020 0.0  0.0 - 0.2 % Final  . Neutrophils Relative % 01/11/2020 72  % Final  . Neutro Abs 01/11/2020 5.4  1.7 - 7.7 K/uL Final  . Lymphocytes Relative 01/11/2020 12  % Final  . Lymphs Abs 01/11/2020 0.9  0.7 - 4.0 K/uL Final  . Monocytes Relative 01/11/2020 12  % Final  . Monocytes Absolute 01/11/2020 0.9  0.1 - 1.0 K/uL Final  . Eosinophils Relative 01/11/2020 3  % Final  . Eosinophils Absolute 01/11/2020 0.3  0.0 - 0.5 K/uL Final  . Basophils Relative 01/11/2020 1  % Final  . Basophils Absolute 01/11/2020 0.0  0.0 - 0.1 K/uL Final  . Immature Granulocytes 01/11/2020 0  % Final  . Abs Immature Granulocytes 01/11/2020 0.03  0.00 - 0.07 K/uL Final   Performed at [facility], [place] [address], [place], [place] [address]  . Sodium 01/11/2020 140  135 - 145 mmol/L Final  . Potassium 01/11/2020 4.1  3.5 - 5.1 mmol/L Final  . Chloride 01/11/2020 103  98 - 111 mmol/L Final  . CO2 01/11/2020 26  22 - 32 mmol/L Final  . Glucose, Bld 01/11/2020 111* 70 - 99 mg/dL Final  . BUN 01/11/2020 15  8 - 23 mg/dL Final  . Creatinine 01/11/2020 1.03* 0.44 - 1.00 mg/dL Final  . Calcium 01/11/2020 9.6  8.9 - 10.3 mg/dL Final  . Total Protein 01/11/2020 7.6  6.5 - 8.1 g/dL Final  . Albumin 01/11/2020 3.1* 3.5 - 5.0 g/dL Final  . AST 01/11/2020 12* 15 - 41 U/L Final  . ALT 01/11/2020 10  0 - 44 U/L Final  . Alkaline Phosphatase 01/11/2020 208* 38 - 126 U/L Final  . Total Bilirubin 01/11/2020 <0.2* 0.3 - 1.2 mg/dL Final  . GFR, Est Non Af Am 01/11/2020 55* >60 mL/min Final  . GFR, Est AFR Am 01/11/2020 >60  >60 mL/min Final  . Anion gap 01/11/2020 11  5 - 15 Final   Performed at [facility], [place] [address] [place], [place] [address]    (this displays the last labs from the last 3 days)  No results found for: TOTALPROTELP, ALBUMINELP, A1GS, A2GS, BETS, BETA2SER, GAMS, MSPIKE, SPEI (this displays SPEP labs)  No results found for: KPAFRELGTCHN, LAMBDASER, KAPLAMBRATIO (kappa/lambda light chains)  No results found for: HGBA, HGBA2QUANT, HGBFQUANT, HGBSQUAN (Hemoglobinopathy evaluation)   No results found for: LDH  No results found for: IRON, TIBC, IRONPCTSAT (Iron and TIBC)  No results found for: FERRITIN  Urinalysis    Component Value Date/Time   LABSPEC 1.020 02/10/2011 2058   PHURINE 6.0 02/10/2011 2058   HGBUR LARGE (A) 02/10/2011 2058   BILIRUBINUR MODERATE (A) 02/10/2011 2058   KETONESUR 15 (A) 02/10/2011 2058   PROTEINUR >=300 (A) 02/10/2011 2058   UROBILINOGEN >=8.0 02/10/2011 2058   NITRITE NEGATIVE 02/10/2011 2058   LEUKOCYTESUR  02/10/2011 2058    NEGATIVE Biochemical Testing Only. Please order routine urinalysis from main lab if  confirmatory testing is needed.     STUDIES: US BIOPSY (LIVER)  Result Date: 12/14/2019 CLINICAL DATA:  History of breast carcinoma. New liver lesions.  Biopsy requested EXAM: ULTRASOUND-GUIDED CORE LIVER BIOPSY TECHNIQUE: An ultrasound guided liver biopsy was thoroughly discussed with the patient and questions were answered. The benefits, risks, alternatives, and complications were also discussed. The patient understands and wishes to proceed with the procedure. A verbal as well as written consent was obtained. Survey ultrasound of the liver was performed, representative lesion localized, and an appropriate skin entry site was determined. Skin site was marked, prepped with chlorhexidine, and draped in usual sterile fashion, and infiltrated locally with 1% lidocaine. Intravenous Fentanyl 129mg and Versed 259mwere administered as conscious sedation during continuous monitoring of the patient's level of consciousness and physiological / cardiorespiratory status by the radiology RN, with a total moderate sedation time of 10 minutes. a 1778auge trocar needle was advanced under ultrasound guidance into the liver to the margin of the lesion. 3 coaxial 18gauge core samples were then obtained through the guide needle. The guide needle was removed. Post procedure scans demonstrate no apparent complication. COMPLICATIONS: COMPLICATIONS None immediate FINDINGS: Liver lesions are localized corresponding to previous MR and CT findings. Representative core biopsy samples obtained as above. IMPRESSION: 1. Technically successful ultrasound guided core liver lesion biopsy. Electronically Signed   By: D Lucrezia Europe.D.   On: 12/14/2019 15:49    ELIGIBLE FOR AVAILABLE RESEARCH PROTOCOL: no   ASSESSMENT: [age].o. [place] woman status post right breast upper outer quadrant and right axillary lymph node biopsy 09/03/2017, both positive for invasive ductal carcinoma, grade 3, estrogen receptor positive, progesterone receptor  negative, with an MIB-1 of 90%, the lymph node being HER-2 positive, the breast mass HER-2 negative  (a) staging studies September 24, 2017 show a lytic lesion in T8, possible areas of concern at L1-L2  (b) biopsy/kyphoplasty/osteocool of T8 lesion 11/02/2017 confirms metastatic adenocarcinoma, 20% estrogen receptor positive, progesterone receptor negative  (c) PET scan 11/30/2017 shows no visceral disease, minimal metabolic activity at T8, no other bone lesions  (d) CT of the chest 12/05/2019 shows new lung and liver lesions; bone scan was negative  (1) neoadjuvant chemotherapy with carboplatin, docetaxel, trastuzumab, and pertuzumab started 10/01/2017, completed 6 cycles 01/26/2018  (a) Docetaxel dropped after three cycles and Gemcitabine/Carbo started on 12/15/2017.   (2) trastuzumab to continue indefinitely  (a) echocardiogram 11/38/2018 shows an ejection fraction in the 55-60% range  (b) echocardiogram 06/17/2019 shows an ejection fraction in the 50-55% range  (c) echocardiogram 11/21/2019 shows an ejection fraction in the 50% range, which is mildly decreased.  (d) trastuzumab discontinued after 01/15/2019 dose with disease progression  (3) right lumpectomy and sentinel lymph node sampling 03/08/2018  Showed a pT1(mic) pN0 invasive ductal carcinoma, grade 2, with negative margins.  (4) adjuvant radiation  04-07-18 to 05-11-18                                              (a) SITE/DOSE:  Right Breast and Axilla, SCV nodes / 50 Gy in 25 fx               (b) radiation to her oligo metastatic disease at T8 (18 Gy) in 1 fraction, 04/21/2018  (5) anastrozole started 05/29/2018  (a) bone density 07/19/2019 shows a T score of -0.9, which is normal.  (6) zolendronate started 01/19/2019, repeated every 12 weeks   (7) T-DM1 beginning 12/14/2019  PLAN:  PhSilva Bandys doing well today.  I reviewed her CBC  which is stable.  She tolerated her first cycle of TDM1 without any major difficulty and will  continue this every 3 weeks.  She has also continued on Anastrozole with good tolerance.  Davi receives Zoledronate every 12 weeks.  She doesn't want to receive this today and wants to wait 3 more weeks due to the timing of everything.  I let her know that this is ok.   Aubry will continue to f/u with Dr. Aundra Dubin about her heart function and has her next echo scheduled in 02/2020.  We will see Brianne back in 3 weeks for labs and TDM1 and in 6 weeks for labs, f/u and TDM1.  She was recommended to continue with the appropriate pandemic precautions. She knows to call for any questions that may arise between now and her next appointment.  We are happy to see her sooner if needed.  A total of (20) minutes of face-to-face time was spent with this patient with greater than 50% of that time in counseling and care-coordination.    Wilber Bihari, NP 01/11/20 3:31 PM Medical Oncology and Hematology [facility] [place], [place] [address] Tel. [phone]    Fax. [phone]

## 2020-01-11 NOTE — Patient Instructions (Signed)
[facility] Discharge Instructions for Patients Receiving Chemotherapy  Today you received the following chemotherapy agents: Kadcyla  To help prevent nausea and vomiting after your treatment, we encourage you to take your nausea medication as directed.    If you develop nausea and vomiting that is not controlled by your nausea medication, call the clinic.   BELOW ARE SYMPTOMS THAT SHOULD BE REPORTED IMMEDIATELY:  *FEVER GREATER THAN 100.5 F  *CHILLS WITH OR WITHOUT FEVER  NAUSEA AND VOMITING THAT IS NOT CONTROLLED WITH YOUR NAUSEA MEDICATION  *UNUSUAL SHORTNESS OF BREATH  *UNUSUAL BRUISING OR BLEEDING  TENDERNESS IN MOUTH AND THROAT WITH OR WITHOUT PRESENCE OF ULCERS  *URINARY PROBLEMS  *BOWEL PROBLEMS  UNUSUAL RASH Items with * indicate a potential emergency and should be followed up as soon as possible.  Feel free to call the clinic you have any questions or concerns. The clinic phone number is [phone].  Please show the CHEMO ALERT CARD at check-in to the Emergency Department and triage nurse.   

## 2020-01-11 NOTE — Telephone Encounter (Signed)
-----   Message from Gardenia Phlegm, NP sent at 01/11/2020  3:55 PM EST ----- Please make sure patient is drinking plenty of water.  Her kidney function is slightly elevated.    Thanks, LC ----- Message ----- From: Interface, Lab In [place] Sent: 01/11/2020   1:40 PM EST To: Gardenia Phlegm, NP

## 2020-01-11 NOTE — Telephone Encounter (Signed)
Spoke with patient in infusion to inform that kidney function is slightly elevated.  Per NP, drink plenty of water.  Patient voiced understanding.

## 2020-01-12 ENCOUNTER — Telehealth: Payer: Self-pay | Admitting: Oncology

## 2020-01-12 LAB — CANCER ANTIGEN 27.29: CA 27.29: 502.8 U/mL — ABNORMAL HIGH (ref 0.0–38.6)

## 2020-01-12 NOTE — Telephone Encounter (Signed)
I talk with patient regarding schedule  

## 2020-01-18 NOTE — Telephone Encounter (Signed)
No entry 

## 2020-01-19 ENCOUNTER — Telehealth: Payer: Self-pay | Admitting: *Deleted

## 2020-01-19 NOTE — Telephone Encounter (Signed)
Received call from pt requesting MD advice on having a colonoscopy.  Per MD okay to proceed with colonoscopy with no limits.  Pt verbalized understanding and appreciative of the advice.

## 2020-01-30 ENCOUNTER — Encounter: Payer: Self-pay | Admitting: Oncology

## 2020-01-31 ENCOUNTER — Encounter: Payer: Self-pay | Admitting: Oncology

## 2020-02-01 ENCOUNTER — Inpatient Hospital Stay: Payer: Medicare Other | Attending: Adult Health

## 2020-02-01 ENCOUNTER — Inpatient Hospital Stay: Payer: Medicare Other

## 2020-02-01 ENCOUNTER — Telehealth: Payer: Self-pay | Admitting: *Deleted

## 2020-02-01 ENCOUNTER — Other Ambulatory Visit: Payer: Self-pay

## 2020-02-01 VITALS — BP 128/74 | HR 80 | Temp 98.3°F | Resp 18

## 2020-02-01 DIAGNOSIS — C7800 Secondary malignant neoplasm of unspecified lung: Secondary | ICD-10-CM | POA: Diagnosis not present

## 2020-02-01 DIAGNOSIS — Z5189 Encounter for other specified aftercare: Secondary | ICD-10-CM | POA: Insufficient documentation

## 2020-02-01 DIAGNOSIS — Z170 Estrogen receptor positive status [ER+]: Secondary | ICD-10-CM | POA: Insufficient documentation

## 2020-02-01 DIAGNOSIS — Z95828 Presence of other vascular implants and grafts: Secondary | ICD-10-CM

## 2020-02-01 DIAGNOSIS — C7951 Secondary malignant neoplasm of bone: Secondary | ICD-10-CM | POA: Insufficient documentation

## 2020-02-01 DIAGNOSIS — C50411 Malignant neoplasm of upper-outer quadrant of right female breast: Secondary | ICD-10-CM | POA: Insufficient documentation

## 2020-02-01 DIAGNOSIS — Z5112 Encounter for antineoplastic immunotherapy: Secondary | ICD-10-CM | POA: Insufficient documentation

## 2020-02-01 DIAGNOSIS — Z79899 Other long term (current) drug therapy: Secondary | ICD-10-CM | POA: Diagnosis not present

## 2020-02-01 LAB — CMP (CANCER CENTER ONLY)
ALT: 13 U/L (ref 0–44)
AST: 17 U/L (ref 15–41)
Albumin: 3.1 g/dL — ABNORMAL LOW (ref 3.5–5.0)
Alkaline Phosphatase: 248 U/L — ABNORMAL HIGH (ref 38–126)
Anion gap: 12 (ref 5–15)
BUN: 15 mg/dL (ref 8–23)
CO2: 26 mmol/L (ref 22–32)
Calcium: 10.4 mg/dL — ABNORMAL HIGH (ref 8.9–10.3)
Chloride: 102 mmol/L (ref 98–111)
Creatinine: 0.95 mg/dL (ref 0.44–1.00)
GFR, Est AFR Am: >60 mL/min (ref >60)
GFR, Estimated: >60 mL/min (ref >60)
Glucose, Bld: 93 mg/dL (ref 70–99)
Potassium: 3.9 mmol/L (ref 3.5–5.1)
Sodium: 140 mmol/L (ref 135–145)
Total Bilirubin: <0.2 mg/dL — ABNORMAL LOW (ref 0.3–1.2)
Total Protein: 8.3 g/dL — ABNORMAL HIGH (ref 6.5–8.1)

## 2020-02-01 LAB — CBC WITH DIFFERENTIAL (CANCER CENTER ONLY)
Abs Immature Granulocytes: 0.03 10*3/uL (ref 0.00–0.07)
Basophils Absolute: 0 10*3/uL (ref 0.0–0.1)
Basophils Relative: 0 %
Eosinophils Absolute: 0.1 10*3/uL (ref 0.0–0.5)
Eosinophils Relative: 2 %
HCT: 32.2 % — ABNORMAL LOW (ref 36.0–46.0)
Hemoglobin: 9.6 g/dL — ABNORMAL LOW (ref 12.0–15.0)
Immature Granulocytes: 0 %
Lymphocytes Relative: 15 %
Lymphs Abs: 1.1 10*3/uL (ref 0.7–4.0)
MCH: 23.7 pg — ABNORMAL LOW (ref 26.0–34.0)
MCHC: 29.8 g/dL — ABNORMAL LOW (ref 30.0–36.0)
MCV: 79.5 fL — ABNORMAL LOW (ref 80.0–100.0)
Monocytes Absolute: 0.7 10*3/uL (ref 0.1–1.0)
Monocytes Relative: 10 %
Neutro Abs: 5.1 10*3/uL (ref 1.7–7.7)
Neutrophils Relative %: 73 %
Platelet Count: 517 10*3/uL — ABNORMAL HIGH (ref 150–400)
RBC: 4.05 MIL/uL (ref 3.87–5.11)
RDW: 20.2 % — ABNORMAL HIGH (ref 11.5–15.5)
WBC Count: 7.1 10*3/uL (ref 4.0–10.5)
nRBC: 0 % (ref 0.0–0.2)

## 2020-02-01 MED ORDER — HEPARIN SOD (PORK) LOCK FLUSH 100 UNIT/ML IV SOLN
500.0000 [IU] | Freq: Once | INTRAVENOUS | Status: AC | PRN
  Administered 2020-02-01: 16:00:00 500 [IU]
  Filled 2020-02-01: qty 5

## 2020-02-01 MED ORDER — ZOLEDRONIC ACID 4 MG/100ML IV SOLN
INTRAVENOUS | Status: AC
  Filled 2020-02-01: qty 100

## 2020-02-01 MED ORDER — SODIUM CHLORIDE 0.9 % IV SOLN
3.8000 mg/kg | Freq: Once | INTRAVENOUS | Status: AC
  Administered 2020-02-01: 15:00:00 300 mg via INTRAVENOUS
  Filled 2020-02-01: qty 15

## 2020-02-01 MED ORDER — SODIUM CHLORIDE 0.9 % IV SOLN
Freq: Once | INTRAVENOUS | Status: AC
  Filled 2020-02-01: qty 250

## 2020-02-01 MED ORDER — ACETAMINOPHEN 325 MG PO TABS
ORAL_TABLET | ORAL | Status: AC
  Filled 2020-02-01: qty 2

## 2020-02-01 MED ORDER — SODIUM CHLORIDE 0.9% FLUSH
10.0000 mL | Freq: Once | INTRAVENOUS | Status: AC
  Administered 2020-02-01: 10 mL
  Filled 2020-02-01: qty 10

## 2020-02-01 MED ORDER — ALTEPLASE 2 MG IJ SOLR
2.0000 mg | Freq: Once | INTRAMUSCULAR | Status: AC
  Administered 2020-02-01: 13:00:00 2 mg
  Filled 2020-02-01: qty 2

## 2020-02-01 MED ORDER — SODIUM CHLORIDE 0.9% FLUSH
10.0000 mL | INTRAVENOUS | Status: DC | PRN
  Administered 2020-02-01: 10 mL
  Filled 2020-02-01: qty 10

## 2020-02-01 MED ORDER — DIPHENHYDRAMINE HCL 25 MG PO CAPS
ORAL_CAPSULE | ORAL | Status: AC
  Filled 2020-02-01: qty 1

## 2020-02-01 MED ORDER — DIPHENHYDRAMINE HCL 25 MG PO CAPS
25.0000 mg | ORAL_CAPSULE | Freq: Once | ORAL | Status: AC
  Administered 2020-02-01: 14:00:00 25 mg via ORAL

## 2020-02-01 MED ORDER — ALTEPLASE 2 MG IJ SOLR
INTRAMUSCULAR | Status: AC
  Filled 2020-02-01: qty 2

## 2020-02-01 MED ORDER — ACETAMINOPHEN 325 MG PO TABS
650.0000 mg | ORAL_TABLET | Freq: Once | ORAL | Status: AC
  Administered 2020-02-01: 14:00:00 650 mg via ORAL

## 2020-02-01 MED ORDER — ZOLEDRONIC ACID 4 MG/100ML IV SOLN
4.0000 mg | Freq: Once | INTRAVENOUS | Status: DC
  Filled 2020-02-01: qty 100

## 2020-02-01 MED ORDER — ZOLEDRONIC ACID 4 MG/100ML IV SOLN
4.0000 mg | Freq: Once | INTRAVENOUS | Status: AC
  Administered 2020-02-01: 15:00:00 4 mg via INTRAVENOUS

## 2020-02-01 NOTE — Patient Instructions (Signed)

## 2020-02-01 NOTE — Progress Notes (Signed)
CathFlow instilled by flush nurse at 12:34. Scant blood return noted at 30 min mark. CathFlow re-instilled and waited another 68min. Patient agreeable to have PIV started so treatment orders could be released and pre-meds administered. At 14:34 port assessed again, and same amount of scant blood observed. Updated Dr. Jana Hakim and his desk RN Marlon Pel, and was instructed to deaccess port, and that Val would add more time to patient's next flush appointment in case issue repeated so there would be plenty of time to address problem. Patient updated on plan and verbalized understanding/agreement.   Update--at 15:45 went to deaccess PAC and was able to obtain strong/brisk blood return. Heparin flush instilled and needle removed without incident.

## 2020-02-01 NOTE — Patient Instructions (Signed)
[facility] Discharge Instructions for Patients Receiving Chemotherapy  Today you received the following chemotherapy agents: Ado-Trastuzumab Emtansine ([place])  To help prevent nausea and vomiting after your treatment, we encourage you to take your nausea medication as directed.   If you develop nausea and vomiting that is not controlled by your nausea medication, call the clinic.   BELOW ARE SYMPTOMS THAT SHOULD BE REPORTED IMMEDIATELY:  *FEVER GREATER THAN 100.5 F  *CHILLS WITH OR WITHOUT FEVER  NAUSEA AND VOMITING THAT IS NOT CONTROLLED WITH YOUR NAUSEA MEDICATION  *UNUSUAL SHORTNESS OF BREATH  *UNUSUAL BRUISING OR BLEEDING  TENDERNESS IN MOUTH AND THROAT WITH OR WITHOUT PRESENCE OF ULCERS  *URINARY PROBLEMS  *BOWEL PROBLEMS  UNUSUAL RASH Items with * indicate a potential emergency and should be followed up as soon as possible.  Feel free to call the clinic should you have any questions or concerns. The clinic phone number is [phone].  Please show the [facility] at check-in to the Emergency Department and triage nurse.  Zoledronic Acid injection (Hypercalcemia, Oncology) What is this medicine? ZOLEDRONIC ACID (ZOE le dron ik AS id) lowers the amount of calcium loss from bone. It is used to treat too much calcium in your blood from cancer. It is also used to prevent complications of cancer that has spread to the bone. This medicine may be used for other purposes; ask your health care provider or pharmacist if you have questions. COMMON BRAND NAME(S): Zometa What should I tell my health care provider before I take this medicine? They need to know if you have any of these conditions:  aspirin-sensitive asthma  cancer, especially if you are receiving medicines used to treat cancer  dental disease or wear dentures  infection  kidney disease  receiving corticosteroids like dexamethasone or prednisone  an unusual or allergic reaction to  zoledronic acid, other medicines, foods, dyes, or preservatives  pregnant or trying to get pregnant  breast-feeding How should I use this medicine? This medicine is for infusion into a vein. It is given by a health care professional in a hospital or clinic setting. Talk to your pediatrician regarding the use of this medicine in children. Special care may be needed. Overdosage: If you think you have taken too much of this medicine contact a poison control center or emergency room at once. NOTE: This medicine is only for you. Do not share this medicine with others. What if I miss a dose? It is important not to miss your dose. Call your doctor or health care professional if you are unable to keep an appointment. What may interact with this medicine?  certain antibiotics given by injection  NSAIDs, medicines for pain and inflammation, like ibuprofen or naproxen  some diuretics like bumetanide, furosemide  teriparatide  thalidomide This list may not describe all possible interactions. Give your health care provider a list of all the medicines, herbs, non-prescription drugs, or dietary supplements you use. Also tell them if you smoke, drink alcohol, or use illegal drugs. Some items may interact with your medicine. What should I watch for while using this medicine? Visit your doctor or health care professional for regular checkups. It may be some time before you see the benefit from this medicine. Do not stop taking your medicine unless your doctor tells you to. Your doctor may order blood tests or other tests to see how you are doing. Women should inform their doctor if they wish to become pregnant or think they might  be pregnant. There is a potential for serious side effects to an unborn child. Talk to your health care professional or pharmacist for more information. You should make sure that you get enough calcium and vitamin D while you are taking this medicine. Discuss the foods you eat and  the vitamins you take with your health care professional. Some people who take this medicine have severe bone, joint, and/or muscle pain. This medicine may also increase your risk for jaw problems or a broken thigh bone. Tell your doctor right away if you have severe pain in your jaw, bones, joints, or muscles. Tell your doctor if you have any pain that does not go away or that gets worse. Tell your dentist and dental surgeon that you are taking this medicine. You should not have major dental surgery while on this medicine. See your dentist to have a dental exam and fix any dental problems before starting this medicine. Take good care of your teeth while on this medicine. Make sure you see your dentist for regular follow-up appointments. What side effects may I notice from receiving this medicine? Side effects that you should report to your doctor or health care professional as soon as possible:  allergic reactions like skin rash, itching or hives, swelling of the face, lips, or tongue  anxiety, confusion, or depression  breathing problems  changes in vision  eye pain  feeling faint or lightheaded, falls  jaw pain, especially after dental work  mouth sores  muscle cramps, stiffness, or weakness  redness, blistering, peeling or loosening of the skin, including inside the mouth  trouble passing urine or change in the amount of urine Side effects that usually do not require medical attention (report to your doctor or health care professional if they continue or are bothersome):  bone, joint, or muscle pain  constipation  diarrhea  fever  hair loss  irritation at site where injected  loss of appetite  nausea, vomiting  stomach upset  trouble sleeping  trouble swallowing  weak or tired This list may not describe all possible side effects. Call your doctor for medical advice about side effects. You may report side effects to FDA at 1-800-FDA-1088. Where should I keep my  medicine? This drug is given in a hospital or clinic and will not be stored at home. NOTE: This sheet is a summary. It may not cover all possible information. If you have questions about this medicine, talk to your doctor, pharmacist, or health care provider.  2020 Elsevier/Gold Standard (2014-05-13 14:19:39)  Coronavirus (COVID-19) Are you at risk?  Are you at risk for the Coronavirus (COVID-19)?  To be considered HIGH RISK for Coronavirus (COVID-19), you have to meet the following criteria:  . Traveled to [place], [place], [place], [place] or [place]; or in the [place] to [place], [place], [place], or [place]; and have fever, cough, and shortness of breath within the last 2 weeks of travel OR . Been in close contact with a person diagnosed with COVID-19 within the last 2 weeks and have fever, cough, and shortness of breath . IF YOU DO NOT MEET THESE CRITERIA, YOU ARE CONSIDERED LOW RISK FOR COVID-19.  What to do if you are HIGH RISK for COVID-19?  Marland Kitchen If you are having a medical emergency, call 911. . Seek medical care right away. Before you go to a doctor's office, urgent care or emergency department, call ahead and tell them about your recent travel, contact with someone diagnosed with  COVID-19, and your symptoms. You should receive instructions from your physician's office regarding next steps of care.  . When you arrive at healthcare provider, tell the healthcare staff immediately you have returned from visiting [place], [place], [place], [place] or [place]; or traveled in the [place] to [place], [place], [place], or [place]; in the last two weeks or you have been in close contact with a person diagnosed with COVID-19 in the last 2 weeks.   . Tell the health care staff about your symptoms: fever, cough and shortness of breath. . After you have been seen by a medical provider, you will be either: o Tested for (COVID-19) and discharged home on quarantine except to  seek medical care if symptoms worsen, and asked to  - Stay home and avoid contact with others until you get your results (4-5 days)  - Avoid travel on public transportation if possible (such as bus, train, or airplane) or o Sent to the Emergency Department by EMS for evaluation, COVID-19 testing, and possible admission depending on your condition and test results.  What to do if you are LOW RISK for COVID-19?  Reduce your risk of any infection by using the same precautions used for avoiding the common cold or flu:  Marland Kitchen Wash your hands often with soap and warm water for at least 20 seconds.  If soap and water are not readily available, use an alcohol-based hand sanitizer with at least 60% alcohol.  . If coughing or sneezing, cover your mouth and nose by coughing or sneezing into the elbow areas of your shirt or coat, into a tissue or into your sleeve (not your hands). . Avoid shaking hands with others and consider head nods or verbal greetings only. . Avoid touching your eyes, nose, or mouth with unwashed hands.  . Avoid close contact with people who are sick. . Avoid places or events with large numbers of people in one location, like concerts or sporting events. . Carefully consider travel plans you have or are making. . If you are planning any travel outside or inside the [place], visit the CDC's Travelers' Health webpage for the latest health notices. . If you have some symptoms but not all symptoms, continue to monitor at home and seek medical attention if your symptoms worsen. . If you are having a medical emergency, call 911.   [place] / e-Visit: [email or web address]         [facility]: [facility]: [id number]                   [facility]: [phone]

## 2020-02-01 NOTE — Telephone Encounter (Signed)
This RN spoke with pt per her call stating concern due to noted elevation in her BP over the past 2 weeks.  She per our records she has had elevations since 1/15 ranging

## 2020-02-02 LAB — CANCER ANTIGEN 27.29: CA 27.29: 709.7 U/mL — ABNORMAL HIGH (ref 0.0–38.6)

## 2020-02-06 ENCOUNTER — Other Ambulatory Visit: Payer: Self-pay | Admitting: Oncology

## 2020-02-06 ENCOUNTER — Encounter: Payer: Self-pay | Admitting: Oncology

## 2020-02-09 ENCOUNTER — Telehealth: Payer: Self-pay | Admitting: Oncology

## 2020-02-09 ENCOUNTER — Telehealth: Payer: Self-pay | Admitting: *Deleted

## 2020-02-09 NOTE — Telephone Encounter (Signed)
Scheduled per 2/11 sch msg. Called and spoke with pt, confirmed 2/16 appt

## 2020-02-09 NOTE — Telephone Encounter (Addendum)
This RN spoke with pt per her call wanting to know why she is being referred for Genetic counseling.  This RN informed her per inquiry with providers- due to recent recurrence- she may benefit from other therapies that are evaluated by Genetics.  [place] stated understanding - and agreement to contact and possible appointment but does want to let them know she only wants to hear benefits (she does not want to hear " bad news " and then have to mentally deal with anything that she causes her to over think and then  becomes negative ) and that appointment and labs would be covered under her insurance.  This RN informed pt above request would be sent to genetic counselors.

## 2020-02-11 ENCOUNTER — Other Ambulatory Visit: Payer: Self-pay | Admitting: Oncology

## 2020-02-11 NOTE — Progress Notes (Signed)
Needs BRCA testing

## 2020-02-13 ENCOUNTER — Other Ambulatory Visit: Payer: Self-pay | Admitting: Oncology

## 2020-02-13 NOTE — Progress Notes (Signed)
[facility] testing on the liver biopsy from 02/05/2020 shows a BRCA2 pathogenic variant (exon 11  P.  [id number]), as well as a variant of uncertain significance, in exon 11 (p.[id number]) with high genomic loss of heterozygosity.  The tumor mutational burden was low, MSI was stable, PD-L1 was negative, PI K3 CA was not detected, HER-2/neu was not mutated and BRCA1 was not mutated.  Olaparib or talazoparib are possible treatments

## 2020-02-14 ENCOUNTER — Encounter: Payer: Self-pay | Admitting: Genetic Counselor

## 2020-02-14 ENCOUNTER — Inpatient Hospital Stay (HOSPITAL_BASED_OUTPATIENT_CLINIC_OR_DEPARTMENT_OTHER): Payer: Medicare Other | Admitting: Genetic Counselor

## 2020-02-14 DIAGNOSIS — Z801 Family history of malignant neoplasm of trachea, bronchus and lung: Secondary | ICD-10-CM | POA: Diagnosis not present

## 2020-02-14 DIAGNOSIS — R898 Other abnormal findings in specimens from other organs, systems and tissues: Secondary | ICD-10-CM | POA: Diagnosis not present

## 2020-02-14 DIAGNOSIS — C50411 Malignant neoplasm of upper-outer quadrant of right female breast: Secondary | ICD-10-CM | POA: Diagnosis not present

## 2020-02-14 DIAGNOSIS — Z170 Estrogen receptor positive status [ER+]: Secondary | ICD-10-CM

## 2020-02-14 NOTE — Progress Notes (Signed)
REFERRING PROVIDER: Chauncey Cruel, MD [address] [place],  [name] [address]  PRIMARY PROVIDER:  Bartholome Bill, MD  PRIMARY REASON FOR VISIT:  1. Malignant neoplasm of upper-outer quadrant of right breast in female, estrogen receptor positive ([place])   2. Abnormal genetic test   3. Family history of lung cancer      I connected with Ms. Tracie [name] on 02/14/2020 at 2:00 pm EDT by MyChart video conference and verified that I am speaking with the correct person using two identifiers.   Patient location: Home Provider location: [facility] office  HISTORY OF PRESENT ILLNESS:   Tracie [name], a 69 y.o. female, was seen for a [place] cancer genetics consultation at the request of Dr. Jana Hakim due to a personal history of a pathogenic BRCA2 variant detected on somatic tumor testing.  Ms. Tracie [name] presents to clinic today to discuss the possibility of a hereditary predisposition to cancer, genetic testing, and to further clarify her future cancer risks, as well as potential cancer risks for family members.   In 2018, at the age of 48, [name] was diagnosed with metastatic invasive ductal carcinoma of the right breast. Her treatment has included chemotherapy, surgery (lumpectomy), radiation therapy, and anti-estrogen therapy.   Per Dr. Jana Hakim, tumor testing through [facility] on the liver biopsy from 02/05/2020 shows a BRCA2 pathogenic variant (exon 11, p.[id number]), as well as a variant of uncertain significance, in exon 11 (p.[id number]) with high genomic loss of heterozygosity.  The tumor mutational burden was low, MSI was stable, PD-L1 was negative, PIK3CA was not detected, HER-2/neu was not mutated and BRCA1 was not mutated.   CANCER HISTORY:  Oncology History  Malignant neoplasm of upper-outer quadrant of right breast in [place], estrogen receptor positive [place]  09/03/2017 Initial Biopsy   status post right breast upper outer quadrant and right axillary lymph node biopsy 09/03/2017,  both positive for invasive ductal carcinoma, grade 3, estrogen receptor positive, progesterone receptor negative, with an MIB-1 of 90%, the lymph node being HER-2 positive, the breast mass HER-2 negative             (a) staging studies September 24, 2017 show a lytic lesion in T8, possible areas of concern at L1-L2             (b) biopsy/kyphoplasty/osteocool of T8 lesion 11/02/2017 confirms metastatic adenocarcinoma, 20% estrogen receptor positive, estrogen receptor negative             (c) PET scan 11/30/2017 shows no visceral disease, minimal metabolic activity at T8, no other bone lesions   09/08/2017 Initial Diagnosis   Malignant neoplasm of upper-outer quadrant of right breast in [place], [place] positive [place]   10/01/2017 - 01/26/2018 Neo-Adjuvant Chemotherapy   neoadjuvant chemotherapy with carboplatin, docetaxel, trastuzumab, and pertuzumab started 10/01/2017, completed 6 cycles 01/26/2018             (a) Docetaxel dropped after three cycles and Gemcitabine/Carbo started on 12/15/2017.    10/01/2017 - 12/06/2019 Chemotherapy   The patient had trastuzumab (HERCEPTIN) 672 mg in sodium chloride 0.9 % 250 mL chemo infusion, 8 mg/kg = 672 mg, Intravenous,  Once, 35 of 40 cycles Dose modification: 4 mg/kg (original dose 6 mg/kg, Cycle 5, Reason: Provider Judgment, Comment: due to hold day of chemo), 450 mg (original dose 450 mg, Cycle 33, Reason: Other (see comments), Comment: insurance ) Administration: 672 mg (10/01/2017), 504 mg (10/22/2017), 300 mg (12/24/2017), 504 mg (11/12/2017), 504 mg (12/03/2017), 450 mg (01/05/2018), 450 mg (01/26/2018),  450 mg (02/16/2018), 450 mg (03/09/2018), 450 mg (04/20/2018), 450 mg (05/11/2018), 450 mg (06/01/2018), 450 mg (06/22/2018), 450 mg (07/13/2018), 450 mg (08/03/2018), 450 mg (08/24/2018), 450 mg (09/14/2018), 450 mg (10/05/2018), 450 mg (10/26/2018), 450 mg (11/23/2018), 450 mg (12/20/2018), 450 mg (01/19/2019), 450 mg (02/15/2019), 450 mg (03/15/2019), 450 mg  (04/12/2019), 450 mg (05/11/2019), 450 mg (06/08/2019), 450 mg (07/06/2019), 450 mg (07/27/2019), 450 mg (08/17/2019), 450 mg (09/07/2019), 450 mg (10/06/2019), 450 mg (10/26/2019), 450 mg (11/16/2019) pertuzumab (PERJETA) 420 mg in sodium chloride 0.9 % 250 mL chemo infusion, 420 mg (100 % of original dose 420 mg), Intravenous, Once, 7 of 7 cycles Dose modification: 420 mg (original dose 420 mg, Cycle 1, Reason: Provider Judgment) Administration: 420 mg (10/01/2017), 420 mg (10/22/2017), 420 mg (12/24/2017), 420 mg (11/12/2017), 420 mg (12/03/2017), 420 mg (01/05/2018), 420 mg (01/26/2018)  for chemotherapy treatment.    01/2018 -  Adjuvant Chemotherapy   trastuzumab to continue indefinitely             (a) echocardiogram 11/38/2018 shows an ejection fraction in the 55-60% range             (b) echocardiogram 03/01/2018 shows an ejection fraction in the 60-65% range             (c) echocardiogram 06/04/2018 shows an EF of 60-65%   03/08/2018 Surgery   right lumpectomy and sentinel lymph node sampling 03/08/2018  Showed a pT1(mic) pN0 invasive ductal carcinoma, grade 2, with negative margins.   04/07/2018 - 05/11/2018 Radiation Therapy   adjuvant radiation 04-07-18 to 05-11-18            (a) SITE/DOSE:Right Breast and Axilla, SCV nodes / 50 Gy in 25 fx                         (b) radiation to her oligo metastatic disease at T8 (18 Gy) in 1 fraction, 04/24/2   05/2018 -  Anti-estrogen oral therapy   Anastrozole daily   12/16/2019 -  Chemotherapy   The patient had ado-trastuzumab emtansine (KADCYLA) 300 mg in sodium chloride 0.9 % 250 mL chemo infusion, 3.8 mg/kg = 280 mg, Intravenous, Once, 3 of 5 cycles Administration: 300 mg (12/16/2019), 300 mg (01/11/2020), 300 mg (02/01/2020)  for chemotherapy treatment.       RISK FACTORS:  Menarche was at age 57.  First live birth at age 42.  OCP use for approximately 0 years.  Ovaries intact: yes.    Hysterectomy: no.  Menopausal status: postmenopausal.  HRT use: 0 years. Colonoscopy: yes; normal in 2012, in the process of scheduling another. Mammogram within the last year: yes. Any excessive radiation exposure in the past: no  Past Medical History:  Diagnosis Date  . Angina pectoris ([place])    history of   . Arthritis   . Asthma   . Cancer ([place])    right breast  . Constipation   . Diverticulitis   . Family history of lung cancer   . History of radiation therapy 04/07/18- 05/11/18   50 Gy in 25 fractions to right breast and regional nodes with four fields (no boost)  . History of radiation therapy 04/21/2018   T8 spine, 18 Gy in 1 fraction for a total dose of 18 Gy.   Marland Kitchen HPV in female   . Hyperlipidemia   . Hypertension   . Hypothyroidism   . Wears dentures   . Wears glasses     Past Surgical  History:  Procedure Laterality Date  . BREAST LUMPECTOMY WITH RADIOACTIVE SEED AND SENTINEL LYMPH NODE BIOPSY Right 03/08/2018   Procedure: RIGHT BREAST RADIOACTIVE SEED GUIDED LUMPECTOMY WITH RIGHT AXILLARY RADIOACTIVE SEED TARGETED LYMPH NODE EXCISION AND SENTINEL LYMPH NODE BIOPSY, INJECT BLUE DYE RIGHT BREAST;  Surgeon: [name], MD;  Location: [facility];  Service: General;  Laterality: Right;  . CESAREAN SECTION    . COLON SURGERY  2012   sigmoidectomy  . COLOSTOMY    . COLOSTOMY TAKEDOWN  10/06/11  . IR BONE TUMOR(S)RF ABLATION  11/02/2017  . IR KYPHO THORACIC WITH BONE BIOPSY  11/02/2017  . IR RADIOLOGIST EVAL & MGMT  10/07/2017  . IR RADIOLOGIST EVAL & MGMT  11/25/2017  . PORTACATH PLACEMENT N/A 09/25/2017   Procedure: INSERTION PORT-A-CATH WITH ULTRA SOUND ERAS PATHWAY;  Surgeon: [name], MD;  Location: [facility];  Service: General;  Laterality: N/A;  . TONSILLECTOMY    . TUBAL LIGATION      Social History   Socioeconomic History  . Marital status: Divorced    Spouse name: Not on file  . Number of children: Not on file  . Years of education: Not on file  .  Highest education level: Not on file  Occupational History  . Not on file  Tobacco Use  . Smoking status: Current Some Day Smoker    Packs/day: 0.25    Years: 40.00    Pack years: 10.00    Types: Cigarettes  . Smokeless tobacco: Never Used  . Tobacco comment: she is smoking 2 or more daily, she plans to use nicotine patch.   Substance and Sexual Activity  . Alcohol use: Yes    Comment: occasional  . Drug use: No  . Sexual activity: Not on file  Other Topics Concern  . Not on file  Social History Narrative  . Not on file   Social Determinants of Health   Financial Resource Strain:   . Difficulty of Paying Living Expenses: Not on file  Food Insecurity:   . Worried About Charity fundraiser in the Last Year: Not on file  . Ran Out of Food in the Last Year: Not on file  Transportation Needs:   . Lack of Transportation (Medical): Not on file  . Lack of Transportation (Non-Medical): Not on file  Physical Activity:   . Days of Exercise per Week: Not on file  . Minutes of Exercise per Session: Not on file  Stress:   . Feeling of Stress : Not on file  Social Connections:   . Frequency of Communication with Friends and Family: Not on file  . Frequency of Social Gatherings with Friends and Family: Not on file  . Attends Religious Services: Not on file  . Active Member of Clubs or Organizations: Not on file  . Attends Archivist Meetings: Not on file  . Marital Status: Not on file     FAMILY HISTORY:  We obtained a detailed, 4-generation family history.  Significant diagnoses are listed below: Family History  Problem Relation Age of Onset  . Cancer Mother 52       rectal/unknown type  . Hypertension Brother   . Stroke Brother   . Asthma Brother   . Hypertension Brother   . Asthma Son   . Asthma Brother   . Diabetes Maternal Aunt   . Diabetes Paternal Aunt   . Lung cancer Maternal Grandmother 87  . Heart Problems Paternal Grandmother   . Diabetes Maternal  Aunt   . Heart attack Maternal Aunt 43   Ms. Lebon has three sons who are ages 4, 45, and 83. Her youngest and oldest son both have asthma. She has one brother who is 57 and has a history of asthma, stroke, and high blood pressure. She had a nephew who died when he was 38 or 39 from a severe asthma attack. None of these individuals have had cancer.  Ms. Kitzmiller mother died at age 71 from an unknown type of cancer that she describes started on the buttock, then travelled inward to her vagina and ovaries. Ms. Ploch had three maternal aunts. Two had diabetes and died in their 21s and 31s, and the third died at age 15 from a heart attack. Her maternal grandmother died at age 43 from lung cancer. Her maternal grandfather died at age 67 and had part of his liver removed, high blood pressure, and hardening of the arteries. There are no other known diagnoses of cancer on the maternal side of the family.  Ms. Stallman is not sure how old her father was when he died or if he had any major medical conditions. She has limited information about this side of the family, but knows that she had one paternal aunt who had diabetes. Her paternal grandmother died in her 63s and had heart problems, and she is not sure about the health of her paternal grandfather.    Ms. Knapke is unaware of previous family history of genetic testing for hereditary cancer risks. She does not know her ancestry. There is no reported Ashkenazi Jewish ancestry. There is no known consanguinity.  GENETIC COUNSELING ASSESSMENT: Ms. Fleeger is a 69 y.o. female with a personal history of breast cancer and a pathogenic BRCA2 variant on tumor testing, which is somewhat suggestive of a hereditary cancer syndrome and predisposition to cancer. We, therefore, discussed and recommended the following at today's visit.   DISCUSSION:  We discussed that Ms. Bronk has already had genetic testing on her tumor tissue to determine if there are any targeted  treatment options that may be appropriate for her based on genetic alterations in the tumor. This testing did detect a pathogenic variant in the BRCA2 gene, determining that she may benefit from PARP inhibitor treatment. While this pathogenic variant was detected in her tumor, it is unclear if this is a germline variant that she was born with, or if it is an acquired, somatic mutation. Because a significant portion of BRCA1 and BRCA2 mutations detected on tumor testing are germline variants, follow-up germline genetic testing is recommended for all individuals who have a BRCA mutation identified through tumor testing.  We discussed that testing and identifying a hereditary cancer syndrome can be beneficial for several reasons, including knowing about other cancer risks, identifying potential screening and risk-reduction options that may be appropriate, and to understand if other family members could be at risk for cancer and allow them to undergo genetic testing.   We reviewed the characteristics, features and inheritance patterns of hereditary cancer syndromes. We also discussed genetic testing, including the appropriate family members to test, the process of testing, insurance coverage and turn-around-time for results. We discussed the implications of a negative, positive and/or variant of uncertain significant result. We recommended Ms. Savino pursue genetic testing for the BRCA2 gene.   Based on Ms. Luttrull's personal history of a pathogenic BRCA2 variant detected on somatic tumor testing, she meets medical criteria for genetic testing. Despite that she meets criteria, she may still  have an out of pocket cost. We discussed that if her out of pocket cost for testing is over $100, the laboratory will call and confirm whether she wants to proceed with testing.  If the out of pocket cost of testing is less than $100 she will be billed by the genetic testing laboratory.  Ms. Gladue expressed concern regarding  the potential cost of genetic testing. Therefore, we discussed the option to request an insurance preverification through the genetic testing laboratory, which will provide an estimated out of pocket cost of genetic testing if billed through insurance.  PLAN:  We will request an insurance preverification through [employer] to better determine what her out of pocket cost for genetic testing might be. Once available, the estimated out of pocket cost will be communicated to Ms. Ruggles via telephone, at which point she will decide whether she is comfortable moving forward with genetic testing.   If she does choose to proceed with genetic testing, her DNA sample will be sent to [facility] for analysis of the BRCA2 gene.  Results should be available within approximately two-three weeks' time, at which point they will be disclosed by telephone to Ms. Prest, as will any additional recommendations warranted by these results. Ms. Baus will receive a summary of her genetic counseling visit and a copy of her results once available. This information will also be available in Epic.   Ms. Meeker questions were answered to her satisfaction today. Our contact information was provided should additional questions or concerns arise. Thank you for the referral and allowing [place] to share in the care of your patient.   Clint Guy, MS, [facility] Genetic Counselor [email or web address] Phone: [phone]  The patient was seen for a total of 40 minutes in face-to-face genetic counseling.  This patient was discussed with Drs. Magrinat, Lindi Adie and/or Burr Medico who agrees with the above.    _______________________________________________________________________ For Office Staff:  Number of people involved in session: 1 Was an Intern/ student involved with case: no

## 2020-02-16 ENCOUNTER — Other Ambulatory Visit: Payer: Self-pay | Admitting: Oncology

## 2020-02-18 ENCOUNTER — Encounter: Payer: Self-pay | Admitting: Oncology

## 2020-02-22 ENCOUNTER — Inpatient Hospital Stay: Payer: Medicare Other

## 2020-02-22 ENCOUNTER — Inpatient Hospital Stay (HOSPITAL_BASED_OUTPATIENT_CLINIC_OR_DEPARTMENT_OTHER): Payer: Medicare Other | Admitting: Adult Health

## 2020-02-22 ENCOUNTER — Encounter: Payer: Self-pay | Admitting: Adult Health

## 2020-02-22 ENCOUNTER — Telehealth: Payer: Self-pay | Admitting: *Deleted

## 2020-02-22 ENCOUNTER — Other Ambulatory Visit: Payer: Self-pay

## 2020-02-22 VITALS — BP 100/64 | HR 98 | Temp 98.3°F | Resp 18 | Ht 63.0 in | Wt 159.3 lb

## 2020-02-22 DIAGNOSIS — Z95828 Presence of other vascular implants and grafts: Secondary | ICD-10-CM

## 2020-02-22 DIAGNOSIS — C50411 Malignant neoplasm of upper-outer quadrant of right female breast: Secondary | ICD-10-CM

## 2020-02-22 DIAGNOSIS — Z170 Estrogen receptor positive status [ER+]: Secondary | ICD-10-CM | POA: Diagnosis not present

## 2020-02-22 DIAGNOSIS — Z5112 Encounter for antineoplastic immunotherapy: Secondary | ICD-10-CM | POA: Diagnosis not present

## 2020-02-22 DIAGNOSIS — C7951 Secondary malignant neoplasm of bone: Secondary | ICD-10-CM

## 2020-02-22 LAB — CBC WITH DIFFERENTIAL (CANCER CENTER ONLY)
Abs Immature Granulocytes: 0.03 10*3/uL (ref 0.00–0.07)
Basophils Absolute: 0 10*3/uL (ref 0.0–0.1)
Basophils Relative: 1 %
Eosinophils Absolute: 0.1 10*3/uL (ref 0.0–0.5)
Eosinophils Relative: 2 %
HCT: 29.9 % — ABNORMAL LOW (ref 36.0–46.0)
Hemoglobin: 8.9 g/dL — ABNORMAL LOW (ref 12.0–15.0)
Immature Granulocytes: 0 %
Lymphocytes Relative: 15 %
Lymphs Abs: 1.2 10*3/uL (ref 0.7–4.0)
MCH: 22.8 pg — ABNORMAL LOW (ref 26.0–34.0)
MCHC: 29.8 g/dL — ABNORMAL LOW (ref 30.0–36.0)
MCV: 76.7 fL — ABNORMAL LOW (ref 80.0–100.0)
Monocytes Absolute: 1 10*3/uL (ref 0.1–1.0)
Monocytes Relative: 13 %
Neutro Abs: 5.6 10*3/uL (ref 1.7–7.7)
Neutrophils Relative %: 69 %
Platelet Count: 468 10*3/uL — ABNORMAL HIGH (ref 150–400)
RBC: 3.9 MIL/uL (ref 3.87–5.11)
RDW: 21.4 % — ABNORMAL HIGH (ref 11.5–15.5)
WBC Count: 8 10*3/uL (ref 4.0–10.5)
nRBC: 0 % (ref 0.0–0.2)

## 2020-02-22 LAB — CMP (CANCER CENTER ONLY)
ALT: 9 U/L (ref 0–44)
AST: 17 U/L (ref 15–41)
Albumin: 2.9 g/dL — ABNORMAL LOW (ref 3.5–5.0)
Alkaline Phosphatase: 320 U/L — ABNORMAL HIGH (ref 38–126)
Anion gap: 11 (ref 5–15)
BUN: 11 mg/dL (ref 8–23)
CO2: 25 mmol/L (ref 22–32)
Calcium: 11.2 mg/dL — ABNORMAL HIGH (ref 8.9–10.3)
Chloride: 103 mmol/L (ref 98–111)
Creatinine: 0.75 mg/dL (ref 0.44–1.00)
GFR, Est AFR Am: >60 mL/min (ref >60)
GFR, Estimated: >60 mL/min (ref >60)
Glucose, Bld: 100 mg/dL — ABNORMAL HIGH (ref 70–99)
Potassium: 3.3 mmol/L — ABNORMAL LOW (ref 3.5–5.1)
Sodium: 139 mmol/L (ref 135–145)
Total Bilirubin: 0.3 mg/dL (ref 0.3–1.2)
Total Protein: 8 g/dL (ref 6.5–8.1)

## 2020-02-22 MED ORDER — DIPHENHYDRAMINE HCL 25 MG PO CAPS
25.0000 mg | ORAL_CAPSULE | Freq: Once | ORAL | Status: AC
  Administered 2020-02-22: 25 mg via ORAL

## 2020-02-22 MED ORDER — ACETAMINOPHEN 325 MG PO TABS
650.0000 mg | ORAL_TABLET | Freq: Once | ORAL | Status: AC
  Administered 2020-02-22: 650 mg via ORAL

## 2020-02-22 MED ORDER — SODIUM CHLORIDE 0.9 % IV SOLN
3.5000 mg/kg | Freq: Once | INTRAVENOUS | Status: AC
  Administered 2020-02-22: 14:00:00 260 mg via INTRAVENOUS
  Filled 2020-02-22: qty 8

## 2020-02-22 MED ORDER — ZOLEDRONIC ACID 4 MG/5ML IV CONC
4.0000 mg | Freq: Once | INTRAVENOUS | Status: DC
  Filled 2020-02-22: qty 5

## 2020-02-22 MED ORDER — DIPHENHYDRAMINE HCL 25 MG PO CAPS
ORAL_CAPSULE | ORAL | Status: AC
  Filled 2020-02-22: qty 1

## 2020-02-22 MED ORDER — HEPARIN SOD (PORK) LOCK FLUSH 100 UNIT/ML IV SOLN
500.0000 [IU] | Freq: Once | INTRAVENOUS | Status: AC | PRN
  Administered 2020-02-22: 15:00:00 500 [IU]
  Filled 2020-02-22: qty 5

## 2020-02-22 MED ORDER — SODIUM CHLORIDE 0.9% FLUSH
10.0000 mL | Freq: Once | INTRAVENOUS | Status: AC
  Administered 2020-02-22: 10 mL
  Filled 2020-02-22: qty 10

## 2020-02-22 MED ORDER — ZOLEDRONIC ACID 4 MG/100ML IV SOLN
4.0000 mg | Freq: Once | INTRAVENOUS | Status: AC
  Administered 2020-02-22: 15:00:00 4 mg via INTRAVENOUS

## 2020-02-22 MED ORDER — ZOLEDRONIC ACID 4 MG/100ML IV SOLN
INTRAVENOUS | Status: AC
  Filled 2020-02-22: qty 100

## 2020-02-22 MED ORDER — ACETAMINOPHEN 325 MG PO TABS
ORAL_TABLET | ORAL | Status: AC
  Filled 2020-02-22: qty 2

## 2020-02-22 MED ORDER — LIDOCAINE-PRILOCAINE 2.5-2.5 % EX CREA: 1.0000 "application " | TOPICAL_CREAM | CUTANEOUS | 0 refills | Status: AC | PRN

## 2020-02-22 MED ORDER — SODIUM CHLORIDE 0.9% FLUSH
10.0000 mL | INTRAVENOUS | Status: DC | PRN
  Administered 2020-02-22: 10 mL
  Filled 2020-02-22: qty 10

## 2020-02-22 MED ORDER — SODIUM CHLORIDE 0.9 % IV SOLN
Freq: Once | INTRAVENOUS | Status: AC
  Filled 2020-02-22: qty 250

## 2020-02-22 NOTE — Progress Notes (Addendum)
Tracie [name]  [phone] [phone]     ID: Tracie [name] DOB: 1951-08-05  MR#: [id number]  EXB#:[id number]  Patient Care Team: Bartholome Bill, MD as PCP - General (Family Medicine) [name], MD as Consulting Physician (General Surgery) Magrinat, [name], MD as Consulting Physician (Oncology) [name], MD as Attending Physician (Radiation Oncology) Mottinger, [place] DDS (Physical Therapy) [name], MD as Consulting Physician (Cardiology) [name], [name], NP as Nurse Practitioner (Hematology and Oncology) OTHER MD:   CHIEF COMPLAINT: Triple positive breast cancer  CURRENT TREATMENT: TDM-1   INTERVAL HISTORY: Tracie [name] returns today for follow-up and treatment of her triple positive breast cancer. She had disease progression that was noted on recent scans and biopsy.  She notes that she does not want to know anything about her scans and biopsy because she doesn't want to be fearful.    [name] was started on TDM-1 in 11/2019.  She tolerates this well other than dry mouth, and metallic taste.    [name] is seen by Dr. Aundra Dubin regularly.  She last saw him in November with an echo that noted an EF of 50%.  She is due for a repeat echo in 02/2020 with f/u with him after.    Tracie [name] notes she has continued on Anastrozole with good tolerance.  She has no arthralgias, hot flashes or vaginal dryness due to this.  REVIEW OF SYSTEMS:  Tracie [name] is doing well today.  She is eating and drinking well.  She has no new pain.  She is without fever or chills, headaches, nausea, vomiting, bowel/bladder changes, or any other concerns.    She notes she has been sleeping a lot lately but that her nights and days get confused because she doesn't go out much due to [place]. Otherwise a detailed ROS was non contributory.     HISTORY OF CURRENT ILLNESS: From the original intake note:  Tracie [name] noted a change in her right breast  sometime in July 2018. [facility] to report that she had found a "kernel" in her breast. She was advised to see her primary physician which she did. The patient was then set up for bilateral diagnostic mammography with tomography and right breast ultrasonography at [facility] 09/03/2017. The breast density was category I a. In the upper outer quadrant of the right breast there was a 2.1 cm high density mass, which was palpable. Also in the right breast more posteriorly there was a 1.5 cm high density mass which was felt to be a suspicious lymph node. Ultrasound of the right breast confirmed a 2.1 centimeter lobulated solid mass in the right breast upper outer quadrant 15.8 cm from the nipple in the 10:00 radiant. There was a 1.5 cm oval mass in the right axillary tail with a small rounded masses also suspicious for lymph node involvement. A total of 3 suspicious lymph nodes were identified.  On 09/03/2017 the patient underwent biopsy of the breast mass and the suspicious right axillary lymph node. The final pathology ([id number]) found invasive ductal carcinoma, grade 3, in both. Both tumors were estrogen receptor positive at 70-75%, and both were progesterone receptor negative. Both had an elevated proliferation marker at 90%. The mass in the breast was HER-2 negative, with a signals ratio of 1.25 and the number per cell 1.88. The lymph node mass however was HER-2 positive with a signals ratio of 2.57, and the number per cell 3.60.  The patient's subsequent history is as  detailed below.   PAST MEDICAL HISTORY: Past Medical History:  Diagnosis Date  . Angina pectoris ([place])    history of   . Arthritis   . Asthma   . Cancer ([place])    right breast  . Constipation   . Diverticulitis   . Family history of lung cancer   . History of radiation therapy 04/07/18- 05/11/18   50 Gy in 25 fractions to right breast and regional nodes with four fields (no boost)  . History of radiation therapy  04/21/2018   T8 spine, 18 Gy in 1 fraction for a total dose of 18 Gy.   Marland Kitchen HPV in female   . Hyperlipidemia   . Hypertension   . Hypothyroidism   . Wears dentures   . Wears glasses     PAST SURGICAL HISTORY: Past Surgical History:  Procedure Laterality Date  . BREAST LUMPECTOMY WITH RADIOACTIVE SEED AND SENTINEL LYMPH NODE BIOPSY Right 03/08/2018   Procedure: RIGHT BREAST RADIOACTIVE SEED GUIDED LUMPECTOMY WITH RIGHT AXILLARY RADIOACTIVE SEED TARGETED LYMPH NODE EXCISION AND SENTINEL LYMPH NODE BIOPSY, INJECT BLUE DYE RIGHT BREAST;  Surgeon: [name], MD;  Location: [facility];  Service: General;  Laterality: Right;  . CESAREAN SECTION    . COLON SURGERY  2012   sigmoidectomy  . COLOSTOMY    . COLOSTOMY TAKEDOWN  10/06/11  . IR BONE TUMOR(S)RF ABLATION  11/02/2017  . IR KYPHO THORACIC WITH BONE BIOPSY  11/02/2017  . IR RADIOLOGIST EVAL & MGMT  10/07/2017  . IR RADIOLOGIST EVAL & MGMT  11/25/2017  . PORTACATH PLACEMENT N/A 09/25/2017   Procedure: INSERTION PORT-A-CATH WITH ULTRA SOUND ERAS PATHWAY;  Surgeon: [name], MD;  Location: [facility];  Service: General;  Laterality: N/A;  . TONSILLECTOMY    . TUBAL LIGATION      FAMILY HISTORY: Family History  Problem Relation Age of Onset  . Cancer Mother 50       rectal/unknown type  . Hypertension Brother   . Stroke Brother   . Asthma Brother   . Hypertension Brother   . Asthma Son   . Asthma Brother   . Diabetes Maternal Aunt   . Diabetes Paternal Aunt   . Lung cancer Maternal Grandmother 87  . Heart Problems Paternal Grandmother   . Diabetes Maternal Aunt   . Heart attack Maternal Aunt 43   The patient has little information regarding her father. Her mother died at age 70 from a strange cancer--the patient does not know what it was, it may well have been cervical cancer from her description. The patient has one brother, no sisters. There is no history of breast or ovarian cancer in the family to the patient's knowledge     GYNECOLOGIC HISTORY:  No LMP recorded. Patient is postmenopausal.  menarche age 1, first live birth age 58 the patient is [place] P3. She stopped having periods at age 46. She never used oral contraceptives or hormone replacement.    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  She is divorced.  Her son [name] stays with her sometimes. He is a Art gallery manager and therefore not working during the pandemic. The 2 other children are LARUTH HANGER, who lives in [place] and works in the theater and media, and Janyla Biscoe lives in [place], and is vice president of a [employer]. The patient has 5 grandchildren. She is a Psychologist, forensic.   ADVANCED DIRECTIVES: Not in  place    HEALTH MAINTENANCE: Social History   Tobacco Use  . Smoking status: Current Some Day Smoker    Packs/day: 0.25    Years: 40.00    Pack years: 10.00    Types: Cigarettes  . Smokeless tobacco: Never Used  . Tobacco comment: she is smoking 2 or more daily, she plans to use nicotine patch.   Substance Use Topics  . Alcohol use: Yes    Comment: occasional  . Drug use: No    Colonoscopy: September 2012   PAP:  Bone density:09/03/2017    Allergies  Allergen Reactions  . Fish Allergy Anaphylaxis and Swelling    Swelling of hands and feet., non shellfish allergy  . Other Anaphylaxis    Tree nuts   . Ace Inhibitors Other (See Comments) and Cough    Cold like symptoms   . Tylenol [Acetaminophen] Other (See Comments)    Headache     Current Outpatient Medications  Medication Sig Dispense Refill  . ADVAIR DISKUS 250-50 MCG/DOSE AEPB INL 1 PUFF ITL Q 12 H    . albuterol (PROAIR HFA) 108 (90 Base) MCG/ACT inhaler INHALE 2 PUFFS BY MOUTH INTO THE LUNGS EVERY 6 HOURS AS NEEDED FOR WHEEZING    . amLODipine (NORVASC) 10 MG tablet Take 10 mg by mouth daily.     Marland Kitchen anastrozole (ARIMIDEX) 1 MG tablet TAKE 1 TABLET(1 MG) BY MOUTH DAILY 90 tablet 4   . aspirin EC 81 MG tablet Take 1 tablet (81 mg total) by mouth daily. 90 tablet 3  . cetirizine (ZYRTEC) 10 MG tablet Take 10 mg by mouth daily as needed for allergies.   11  . levothyroxine (SYNTHROID, LEVOTHROID) 50 MCG tablet Take 50 mcg by mouth daily.    Marland Kitchen lidocaine-prilocaine (EMLA) cream Apply 1 application topically as needed (port access). 30 g 0  . losartan-hydrochlorothiazide (HYZAAR) 100-12.5 MG tablet Take 1 tablet by mouth daily.   3  . metoprolol succinate (TOPROL-XL) 100 MG 24 hr tablet Take 1 tablet (100 mg total) by mouth daily. 30 tablet 4  . naproxen (NAPROSYN) 500 MG tablet TAKE 1 TABLET(500 MG) BY MOUTH TWICE DAILY WITH MEALS FOR BACK OR LEG PAIN    . nicotine (NICODERM CQ - DOSED IN MG/24 HOURS) 14 mg/24hr patch Place 1 patch (14 mg total) onto the skin daily. 28 patch 0  . nitroGLYCERIN (NITROSTAT) 0.4 MG SL tablet Place 1 tablet (0.4 mg total) under the tongue every 5 (five) minutes as needed for chest pain. 75 tablet 3  . potassium chloride (KLOR-CON) 10 MEQ tablet Take 1 tablet (10 mEq total) by mouth daily. 90 tablet 1  . PRALUENT 75 MG/ML SOAJ INJECT 75 MG INTO THE SKIN EVERY 14 DAYS 2 mL 0   No current facility-administered medications for this visit.   Facility-Administered Medications Ordered in Other Visits  Medication Dose Route Frequency Provider Last Rate Last Admin  . acetaminophen (TYLENOL) tablet 650 mg  650 mg Oral Once Magrinat, [name], MD      . ado-trastuzumab emtansine (KADCYLA) 280 mg in sodium chloride 0.9 % 250 mL chemo infusion  3.6 mg/kg (Treatment Plan Recorded) Intravenous Once Magrinat, [name], MD      . diphenhydrAMINE (BENADRYL) capsule 25 mg  25 mg Oral Once Magrinat, [name], MD      . heparin lock flush 100 unit/mL  500 Units Intracatheter Once PRN Magrinat, [name], MD      . sodium chloride flush (NS) 0.9 %  injection 10 mL  10 mL Intracatheter PRN Magrinat, [name], MD        OBJECTIVE:  Vitals:   02/22/20 1255  BP: 100/64   Pulse: 98  Resp: 18  Temp: 98.3 F (36.8 C)  SpO2: 97%     Body mass index is 28.22 kg/m.   Wt Readings from Last 3 Encounters:  02/22/20 159 lb 4.8 oz (72.3 kg)  01/11/20 167 lb 6.4 oz (75.9 kg)  12/07/19 169 lb 9.6 oz (76.9 kg)   ECOG FS:1  GENERAL: Patient is a well appearing female in no acute distress HEENT:  Sclerae anicteric.  Mask in place. Neck is supple.  NODES:  No cervical, supraclavicular, or axillary lymphadenopathy palpated.  BREAST EXAM:  Deferred. LUNGS:  Clear to auscultation bilaterally.  No wheezes or rhonchi. HEART:  Regular rate and rhythm. No murmur appreciated. ABDOMEN:  Soft, nontender.  Positive, normoactive bowel sounds. No organomegaly palpated. MSK:  No focal spinal tenderness to palpation. Full range of motion bilaterally in the upper extremities. EXTREMITIES:  No peripheral edema.   SKIN:  Clear with no obvious rashes or skin changes. No nail dyscrasia. NEURO:  Nonfocal. Well oriented.  Appropriate affect.     LAB RESULTS:  CMP     Component Value Date/Time   NA 139 02/22/2020 1215   NA 138 12/24/2017 0752   K 3.3 (L) 02/22/2020 1215   K 3.3 (L) 12/24/2017 0752   CL 103 02/22/2020 1215   CO2 25 02/22/2020 1215   CO2 25 12/24/2017 0752   GLUCOSE 100 (H) 02/22/2020 1215   GLUCOSE 99 12/24/2017 0752   BUN 11 02/22/2020 1215   BUN 7.9 12/24/2017 0752   CREATININE 0.75 02/22/2020 1215   CREATININE 0.7 12/24/2017 0752   CALCIUM 11.2 (H) 02/22/2020 1215   CALCIUM 9.2 12/24/2017 0752   PROT 8.0 02/22/2020 1215   PROT 6.3 (L) 12/24/2017 0752   ALBUMIN 2.9 (L) 02/22/2020 1215   ALBUMIN 3.4 (L) 12/24/2017 0752   AST 17 02/22/2020 1215   AST 15 12/24/2017 0752   ALT 9 02/22/2020 1215   ALT 18 12/24/2017 0752   ALKPHOS 320 (H) 02/22/2020 1215   ALKPHOS 93 12/24/2017 0752   BILITOT 0.3 02/22/2020 1215   BILITOT <0.22 12/24/2017 0752   GFRNONAA >60 02/22/2020 1215   GFRAA >60 02/22/2020 1215    No results found for: TOTALPROTELP,  ALBUMINELP, A1GS, A2GS, BETS, BETA2SER, GAMS, MSPIKE, SPEI  No results found for: KPAFRELGTCHN, LAMBDASER, KAPLAMBRATIO  Lab Results  Component Value Date   WBC 8.0 02/22/2020   NEUTROABS 5.6 02/22/2020   HGB 8.9 (L) 02/22/2020   HCT 29.9 (L) 02/22/2020   MCV 76.7 (L) 02/22/2020   PLT 468 (H) 02/22/2020      Chemistry      Component Value Date/Time   NA 139 02/22/2020 1215   NA 138 12/24/2017 0752   K 3.3 (L) 02/22/2020 1215   K 3.3 (L) 12/24/2017 0752   CL 103 02/22/2020 1215   CO2 25 02/22/2020 1215   CO2 25 12/24/2017 0752   BUN 11 02/22/2020 1215   BUN 7.9 12/24/2017 0752   CREATININE 0.75 02/22/2020 1215   CREATININE 0.7 12/24/2017 0752      Component Value Date/Time   CALCIUM 11.2 (H) 02/22/2020 1215   CALCIUM 9.2 12/24/2017 0752   ALKPHOS 320 (H) 02/22/2020 1215   ALKPHOS 93 12/24/2017 0752   AST 17 02/22/2020 1215   AST 15 12/24/2017 0752   ALT  9 02/22/2020 1215   ALT 18 12/24/2017 0752   BILITOT 0.3 02/22/2020 1215   BILITOT <0.22 12/24/2017 0752       No results found for: LABCA2  No components found for: [id number]    Appointment on 02/22/2020  Component Date Value Ref Range Status  . Sodium 02/22/2020 139  135 - 145 mmol/L Final  . Potassium 02/22/2020 3.3* 3.5 - 5.1 mmol/L Final  . Chloride 02/22/2020 103  98 - 111 mmol/L Final  . CO2 02/22/2020 25  22 - 32 mmol/L Final  . Glucose, Bld 02/22/2020 100* 70 - 99 mg/dL Final   Glucose reference range applies only to samples taken after fasting for at least 8 hours.  . BUN 02/22/2020 11  8 - 23 mg/dL Final  . Creatinine 02/22/2020 0.75  0.44 - 1.00 mg/dL Final  . Calcium 02/22/2020 11.2* 8.9 - 10.3 mg/dL Final  . Total Protein 02/22/2020 8.0  6.5 - 8.1 g/dL Final  . Albumin 02/22/2020 2.9* 3.5 - 5.0 g/dL Final  . AST 02/22/2020 17  15 - 41 U/L Final  . ALT 02/22/2020 9  0 - 44 U/L Final  . Alkaline Phosphatase 02/22/2020 320* 38 - 126 U/L Final  . Total Bilirubin 02/22/2020 0.3  0.3 - 1.2 mg/dL  Final  . GFR, Est Non Af Am 02/22/2020 >60  >60 mL/min Final  . GFR, Est AFR Am 02/22/2020 >60  >60 mL/min Final  . Anion gap 02/22/2020 11  5 - 15 Final   Performed at [facility], [place] [address] [place], [place] [address]  . WBC Count 02/22/2020 8.0  4.0 - 10.5 K/uL Final  . RBC 02/22/2020 3.90  3.87 - 5.11 MIL/uL Final  . Hemoglobin 02/22/2020 8.9* 12.0 - 15.0 g/dL Final   Comment: Reticulocyte Hemoglobin testing may be clinically indicated, consider ordering this additional test [id number]   . HCT 02/22/2020 29.9* 36.0 - 46.0 % Final  . MCV 02/22/2020 76.7* 80.0 - 100.0 fL Final  . MCH 02/22/2020 22.8* 26.0 - 34.0 pg Final  . MCHC 02/22/2020 29.8* 30.0 - 36.0 g/dL Final  . RDW 02/22/2020 21.4* 11.5 - 15.5 % Final  . Platelet Count 02/22/2020 468* 150 - 400 K/uL Final  . nRBC 02/22/2020 0.0  0.0 - 0.2 % Final  . Neutrophils Relative % 02/22/2020 69  % Final  . Neutro Abs 02/22/2020 5.6  1.7 - 7.7 K/uL Final  . Lymphocytes Relative 02/22/2020 15  % Final  . Lymphs Abs 02/22/2020 1.2  0.7 - 4.0 K/uL Final  . Monocytes Relative 02/22/2020 13  % Final  . Monocytes Absolute 02/22/2020 1.0  0.1 - 1.0 K/uL Final  . Eosinophils Relative 02/22/2020 2  % Final  . Eosinophils Absolute 02/22/2020 0.1  0.0 - 0.5 K/uL Final  . Basophils Relative 02/22/2020 1  % Final  . Basophils Absolute 02/22/2020 0.0  0.0 - 0.1 K/uL Final  . Immature Granulocytes 02/22/2020 0  % Final  . Abs Immature Granulocytes 02/22/2020 0.03  0.00 - 0.07 K/uL Final   Performed at [facility], [place] [address], [place],  [address]    (this displays the last labs from the last 3 days)  No results found for: TOTALPROTELP, ALBUMINELP, A1GS, A2GS, BETS, BETA2SER, GAMS, MSPIKE, SPEI (this displays SPEP labs)  No results found for: KPAFRELGTCHN, LAMBDASER, KAPLAMBRATIO (kappa/lambda light chains)  No results found for: HGBA, HGBA2QUANT, HGBFQUANT,  HGBSQUAN (Hemoglobinopathy evaluation)   No results found for:  LDH  No results found for: IRON, TIBC, IRONPCTSAT (Iron and TIBC)  No results found for: FERRITIN  Urinalysis    Component Value Date/Time   LABSPEC 1.020 02/10/2011 2058   PHURINE 6.0 02/10/2011 2058   HGBUR LARGE (A) 02/10/2011 2058   BILIRUBINUR MODERATE (A) 02/10/2011 2058   KETONESUR 15 (A) 02/10/2011 2058   PROTEINUR >=300 (A) 02/10/2011 2058   UROBILINOGEN >=8.0 02/10/2011 2058   NITRITE NEGATIVE 02/10/2011 2058   LEUKOCYTESUR  02/10/2011 2058    NEGATIVE Biochemical Testing Only. Please order routine urinalysis from main lab if confirmatory testing is needed.     STUDIES: No results found.  ELIGIBLE FOR AVAILABLE RESEARCH PROTOCOL: no   ASSESSMENT: 69 y.o. [place] woman status post right breast upper outer quadrant and right axillary lymph node biopsy 09/03/2017, both positive for invasive ductal carcinoma, grade 3, estrogen receptor positive, progesterone receptor negative, with an MIB-1 of 90%, the lymph node being HER-2 positive, the breast mass HER-2 negative  (a) staging studies September 24, 2017 show a lytic lesion in T8, possible areas of concern at L1-L2  (b) biopsy/kyphoplasty/osteocool of T8 lesion 11/02/2017 confirms metastatic adenocarcinoma, 20% estrogen receptor positive, progesterone receptor negative  (c) PET scan 11/30/2017 shows no visceral disease, minimal metabolic activity at T8, no other bone lesions  (d) CT of the chest 12/05/2019 shows new lung and liver lesions; bone scan was negative  (1) neoadjuvant chemotherapy with carboplatin, docetaxel, trastuzumab, and pertuzumab started 10/01/2017, completed 6 cycles 01/26/2018  (a) Docetaxel dropped after three cycles and Gemcitabine/Carbo started on 12/15/2017.   (2) trastuzumab to continue indefinitely  (a) echocardiogram 11/38/2018 shows an ejection fraction in the 55-60% range  (b) echocardiogram 06/17/2019 shows an ejection  fraction in the 50-55% range  (c) echocardiogram 11/21/2019 shows an ejection fraction in the 50% range, which is mildly decreased.  (d) trastuzumab discontinued after 01/15/2019 dose with disease progression  (3) right lumpectomy and sentinel lymph node sampling 03/08/2018  Showed a pT1(mic) pN0 invasive ductal carcinoma, grade 2, with negative margins.  (4) adjuvant radiation  04-07-18 to 05-11-18                                              (a) SITE/DOSE:  Right Breast and Axilla, SCV nodes / 50 Gy in 25 fx               (b) radiation to her oligo metastatic disease at T8 (18 Gy) in 1 fraction, 04/21/2018  (5) anastrozole started 05/29/2018  (a) bone density 07/19/2019 shows a T score of -0.9, which is normal.  (6) zolendronate started 01/19/2019, repeated every 12 weeks   (7) T-DM1 started 12/15/2020   (a) trastuzumab resumed February 2021 with liver pathology and Caris showing no HER-2 amplification.  (8) measurable disease:  (a) liver MRI 12/12/2019 shows multiple hepatic masses, largest measuring 5.1 cm  (b) liver biopsy 12/14/2019 confirms estrogen receptor positive cancer (60% with weak staining intensity) , progesterone receptor negative and HER-2 not amplified  PLAN:  Meara is doing moderately well today.  We received her caris results, which we were waiting on.  She met with Dr. Jana Hakim and myself today and reviewed her pathology and caris testing briefly.  She will receive TDM1 today, and then we will repeat MRI.  If her MRI shows worsening disease, we will change to [place], Palbociclib.  I reviewed the above with her in detail.  She will continue doing lozenges mouth rinses for her dry mouth.    Janus and I talked about healthy diet and exercise.    She will return in 2 weeks for f/u with Dr. Jana Hakim.  Marland Kitchen  She was recommended to continue with the appropriate pandemic precautions. She knows to call for any questions that may arise between now and her next  appointment.  We are happy to see her sooner if needed.  Total encounter time: 30 minutes*    Wilber Bihari, NP 02/22/20 1:37 PM Medical Oncology and Hematology [facility] [place], [address] [address] Tel. [phone]    Fax. [phone]    ADDENDUM: Tracie [name] has done very well with her T-DM1 and she will receive her fourth dose today.  We have placed an order for liver MRI which has not yet been scheduled.  This needs to be done within the next 10 days or so so we can either continue the current treatment or change to treatment accordingly.  Tracie [name] has a good understanding of the overall plan.  She will call with any issue that may develop before the next visit.  Total encounter time 20 minutes.*  I personally saw this patient and performed a substantive portion of this encounter with the listed APP documented above.   Chauncey Cruel, MD Medical Oncology and Hematology [facility] [address] [place], [place] [address] Tel. [phone]    Fax. [phone]    *Total Encounter Time as defined by the Centers for Medicare and Medicaid Services includes, in addition to the face-to-face time of a patient visit (documented in the note above) non-face-to-face time: obtaining and reviewing outside history, ordering and reviewing medications, tests or procedures, care coordination (communications with other health care professionals or caregivers) and documentation in the medical record.

## 2020-02-22 NOTE — Telephone Encounter (Signed)
Informed per pharmacy correct calcium level today is 12.  Per MD- pt should receive zometa 4 mg today- may use current orders and release early.  Above called to treatment room nurse.

## 2020-02-22 NOTE — Patient Instructions (Signed)
[facility] Discharge Instructions for Patients Receiving Chemotherapy  Today you received the following chemotherapy agents: Ado-Trastuzumab Emtansine ([place])  To help prevent nausea and vomiting after your treatment, we encourage you to take your nausea medication as directed.   If you develop nausea and vomiting that is not controlled by your nausea medication, call the clinic.   BELOW ARE SYMPTOMS THAT SHOULD BE REPORTED IMMEDIATELY:  *FEVER GREATER THAN 100.5 F  *CHILLS WITH OR WITHOUT FEVER  NAUSEA AND VOMITING THAT IS NOT CONTROLLED WITH YOUR NAUSEA MEDICATION  *UNUSUAL SHORTNESS OF BREATH  *UNUSUAL BRUISING OR BLEEDING  TENDERNESS IN MOUTH AND THROAT WITH OR WITHOUT PRESENCE OF ULCERS  *URINARY PROBLEMS  *BOWEL PROBLEMS  UNUSUAL RASH Items with * indicate a potential emergency and should be followed up as soon as possible.  Feel free to call the clinic should you have any questions or concerns. The clinic phone number is [phone].  Please show the [facility] at check-in to the Emergency Department and triage nurse.  Zoledronic Acid injection (Hypercalcemia, Oncology) What is this medicine? ZOLEDRONIC ACID (ZOE le dron ik AS id) lowers the amount of calcium loss from bone. It is used to treat too much calcium in your blood from cancer. It is also used to prevent complications of cancer that has spread to the bone. This medicine may be used for other purposes; ask your health care provider or pharmacist if you have questions. COMMON BRAND NAME(S): Zometa What should I tell my health care provider before I take this medicine? They need to know if you have any of these conditions:  aspirin-sensitive asthma  cancer, especially if you are receiving medicines used to treat cancer  dental disease or wear dentures  infection  kidney disease  receiving corticosteroids like dexamethasone or prednisone  an unusual or allergic reaction to  zoledronic acid, other medicines, foods, dyes, or preservatives  pregnant or trying to get pregnant  breast-feeding How should I use this medicine? This medicine is for infusion into a vein. It is given by a health care professional in a hospital or clinic setting. Talk to your pediatrician regarding the use of this medicine in children. Special care may be needed. Overdosage: If you think you have taken too much of this medicine contact a poison control center or emergency room at once. NOTE: This medicine is only for you. Do not share this medicine with others. What if I miss a dose? It is important not to miss your dose. Call your doctor or health care professional if you are unable to keep an appointment. What may interact with this medicine?  certain antibiotics given by injection  NSAIDs, medicines for pain and inflammation, like ibuprofen or naproxen  some diuretics like bumetanide, furosemide  teriparatide  thalidomide This list may not describe all possible interactions. Give your health care provider a list of all the medicines, herbs, non-prescription drugs, or dietary supplements you use. Also tell them if you smoke, drink alcohol, or use illegal drugs. Some items may interact with your medicine. What should I watch for while using this medicine? Visit your doctor or health care professional for regular checkups. It may be some time before you see the benefit from this medicine. Do not stop taking your medicine unless your doctor tells you to. Your doctor may order blood tests or other tests to see how you are doing. Women should inform their doctor if they wish to become pregnant or think they might  be pregnant. There is a potential for serious side effects to an unborn child. Talk to your health care professional or pharmacist for more information. You should make sure that you get enough calcium and vitamin D while you are taking this medicine. Discuss the foods you eat and  the vitamins you take with your health care professional. Some people who take this medicine have severe bone, joint, and/or muscle pain. This medicine may also increase your risk for jaw problems or a broken thigh bone. Tell your doctor right away if you have severe pain in your jaw, bones, joints, or muscles. Tell your doctor if you have any pain that does not go away or that gets worse. Tell your dentist and dental surgeon that you are taking this medicine. You should not have major dental surgery while on this medicine. See your dentist to have a dental exam and fix any dental problems before starting this medicine. Take good care of your teeth while on this medicine. Make sure you see your dentist for regular follow-up appointments. What side effects may I notice from receiving this medicine? Side effects that you should report to your doctor or health care professional as soon as possible:  allergic reactions like skin rash, itching or hives, swelling of the face, lips, or tongue  anxiety, confusion, or depression  breathing problems  changes in vision  eye pain  feeling faint or lightheaded, falls  jaw pain, especially after dental work  mouth sores  muscle cramps, stiffness, or weakness  redness, blistering, peeling or loosening of the skin, including inside the mouth  trouble passing urine or change in the amount of urine Side effects that usually do not require medical attention (report to your doctor or health care professional if they continue or are bothersome):  bone, joint, or muscle pain  constipation  diarrhea  fever  hair loss  irritation at site where injected  loss of appetite  nausea, vomiting  stomach upset  trouble sleeping  trouble swallowing  weak or tired This list may not describe all possible side effects. Call your doctor for medical advice about side effects. You may report side effects to FDA at 1-800-FDA-1088. Where should I keep my  medicine? This drug is given in a hospital or clinic and will not be stored at home. NOTE: This sheet is a summary. It may not cover all possible information. If you have questions about this medicine, talk to your doctor, pharmacist, or health care provider.  2020 Elsevier/Gold Standard (2014-05-13 14:19:39)  Coronavirus (COVID-19) Are you at risk?  Are you at risk for the Coronavirus (COVID-19)?  To be considered HIGH RISK for Coronavirus (COVID-19), you have to meet the following criteria:  . Traveled to [place], [place], [place], [place] or [place]; or in the [place] to [place], [place], [place], or [place]; and have fever, cough, and shortness of breath within the last 2 weeks of travel OR . Been in close contact with a person diagnosed with COVID-19 within the last 2 weeks and have fever, cough, and shortness of breath . IF YOU DO NOT MEET THESE CRITERIA, YOU ARE CONSIDERED LOW RISK FOR COVID-19.  What to do if you are HIGH RISK for COVID-19?  Marland Kitchen If you are having a medical emergency, call 911. . Seek medical care right away. Before you go to a doctor's office, urgent care or emergency department, call ahead and tell them about your recent travel, contact with someone diagnosed with  COVID-19, and your symptoms. You should receive instructions from your physician's office regarding next steps of care.  . When you arrive at healthcare provider, tell the healthcare staff immediately you have returned from visiting [place], [place], [place], [place] or [place]; or traveled in the [place] to [place], [place], [place], or [place]; in the last two weeks or you have been in close contact with a person diagnosed with COVID-19 in the last 2 weeks.   . Tell the health care staff about your symptoms: fever, cough and shortness of breath. . After you have been seen by a medical provider, you will be either: o Tested for (COVID-19) and discharged home on quarantine except to  seek medical care if symptoms worsen, and asked to  - Stay home and avoid contact with others until you get your results (4-5 days)  - Avoid travel on public transportation if possible (such as bus, train, or airplane) or o Sent to the Emergency Department by EMS for evaluation, COVID-19 testing, and possible admission depending on your condition and test results.  What to do if you are LOW RISK for COVID-19?  Reduce your risk of any infection by using the same precautions used for avoiding the common cold or flu:  Marland Kitchen Wash your hands often with soap and warm water for at least 20 seconds.  If soap and water are not readily available, use an alcohol-based hand sanitizer with at least 60% alcohol.  . If coughing or sneezing, cover your mouth and nose by coughing or sneezing into the elbow areas of your shirt or coat, into a tissue or into your sleeve (not your hands). . Avoid shaking hands with others and consider head nods or verbal greetings only. . Avoid touching your eyes, nose, or mouth with unwashed hands.  . Avoid close contact with people who are sick. . Avoid places or events with large numbers of people in one location, like concerts or sporting events. . Carefully consider travel plans you have or are making. . If you are planning any travel outside or inside the [place], visit the CDC's Travelers' Health webpage for the latest health notices. . If you have some symptoms but not all symptoms, continue to monitor at home and seek medical attention if your symptoms worsen. . If you are having a medical emergency, call 911.   [place] / e-Visit: [email or web address]         [facility]: [facility]: [id number]                   [facility]: [phone]

## 2020-02-23 ENCOUNTER — Telehealth: Payer: Self-pay | Admitting: Oncology

## 2020-02-23 LAB — CANCER ANTIGEN 27.29: CA 27.29: 832.8 U/mL — ABNORMAL HIGH (ref 0.0–38.6)

## 2020-02-23 NOTE — Telephone Encounter (Signed)
I left a message regarding schedule  

## 2020-02-24 ENCOUNTER — Encounter: Payer: Self-pay | Admitting: Oncology

## 2020-02-29 ENCOUNTER — Telehealth: Payer: Self-pay | Admitting: Genetic Counselor

## 2020-02-29 NOTE — Telephone Encounter (Signed)
LVM that we received the cost estimate for genetic testing. Requested that she call back to discuss whether she would like to proceed with testing.

## 2020-03-02 NOTE — Telephone Encounter (Signed)
Returned missed call to discuss the estimated cost of genetic testing. LVM requesting that she call back.

## 2020-03-05 ENCOUNTER — Other Ambulatory Visit: Payer: Self-pay

## 2020-03-05 ENCOUNTER — Ambulatory Visit (HOSPITAL_COMMUNITY)
Admission: RE | Admit: 2020-03-05 | Discharge: 2020-03-05 | Disposition: A | Discharge status: Home / Self Care | Payer: Medicare Other | Source: Ambulatory Visit | Attending: Oncology | Admitting: Oncology

## 2020-03-05 DIAGNOSIS — Z170 Estrogen receptor positive status [ER+]: Secondary | ICD-10-CM | POA: Insufficient documentation

## 2020-03-05 DIAGNOSIS — C50411 Malignant neoplasm of upper-outer quadrant of right female breast: Secondary | ICD-10-CM | POA: Diagnosis present

## 2020-03-05 MED ORDER — GADOBUTROL 1 MMOL/ML IV SOLN
7.0000 mL | Freq: Once | INTRAVENOUS | Status: AC | PRN
  Administered 2020-03-05: 7 mL via INTRAVENOUS

## 2020-03-07 NOTE — Progress Notes (Signed)
[place]  [phone] [phone]     ID: Tracie [name] DOB: February 05, 1951  MR#: [id number]  PJA#:[id number]  Patient Care Team: Bartholome Bill, MD as PCP - General (Family Medicine) [name], MD as Consulting Physician (General Surgery) Derryl Uher, [name], MD as Consulting Physician (Oncology) [name], MD as Attending Physician (Radiation Oncology) Mottinger, Killona DDS (Physical Therapy) [name], MD as Consulting Physician (Cardiology) [name], [name], NP as Nurse Practitioner (Hematology and Oncology) OTHER MD:   CHIEF COMPLAINT: Triple positive breast cancer  CURRENT TREATMENT: trastuzumab [Q 28 d]; letrozole; palbociclib   INTERVAL HISTORY: Tracie [name] returns today for follow-up and treatment of her triple positive breast cancer.   Tracie [name] was started on TDM-1 in 11/2019.  She received 4 doses, which she tolerated remarkably well.  She is scheduled for repeat echocardiogram 03/20/2020.  Unfortunately when she underwent follow up liver MRI on 03/05/2020 this showed: progressive metastatic disease to the liver, the largest of which measures 15.7 cm; no new metastatic disease elsewhere in the abdomen; multiple areas of nodular signal intensity noted throughout visualized lung bases concerning for metastatic disease.  Tracie [name] notes she has continued on Anastrozole with good tolerance.  Her most recent bone density screening on 07/19/2019 showed a T-score of -0.9.  Her tumor marker continues to rise: Lab Results  Component Value Date   CA2729 832.8 (H) 02/22/2020   CA2729 709.7 (H) 02/01/2020   CA2729 502.8 (H) 01/11/2020   CA2729 263.4 (H) 12/16/2019   CA2729 277.1 (H) 12/07/2019    REVIEW OF SYSTEMS: Tracie [name] has some problems with dry mouth and she is using Biotene rinses.  She has occasional nausea but she only vomited one time, on the day of her liver MRI.  She has a metallic taste in her mouth.   Otherwise she says her appetite is fine.  She has lost a few pounds without really trying to.  She has mild constipation.  Unfortunately she has not been able to quit smoking but she is trying.   HISTORY OF CURRENT ILLNESS: From the original intake note:  Tracie [name] noted a change in her right breast sometime in July 2018. [facility] to report that she had found a "kernel" in her breast. She was advised to see her primary physician which she did. The patient was then set up for bilateral diagnostic mammography with tomography and right breast ultrasonography at [facility] 09/03/2017. The breast density was category I a. In the upper outer quadrant of the right breast there was a 2.1 cm high density mass, which was palpable. Also in the right breast more posteriorly there was a 1.5 cm high density mass which was felt to be a suspicious lymph node. Ultrasound of the right breast confirmed a 2.1 centimeter lobulated solid mass in the right breast upper outer quadrant 15.8 cm from the nipple in the 10:00 radiant. There was a 1.5 cm oval mass in the right axillary tail with a small rounded masses also suspicious for lymph node involvement. A total of 3 suspicious lymph nodes were identified.  On 09/03/2017 the patient underwent biopsy of the breast mass and the suspicious right axillary lymph node. The final pathology ([id number]) found invasive ductal carcinoma, grade 3, in both. Both tumors were estrogen receptor positive at 70-75%, and both were progesterone receptor negative. Both had an elevated proliferation marker at 90%. The mass in the breast was HER-2 negative, with a signals ratio of 1.25 and  the number per cell 1.88. The lymph node mass however was HER-2 positive with a signals ratio of 2.57, and the number per cell 3.60.  The patient's subsequent history is as detailed below.   PAST MEDICAL HISTORY: Past Medical History:  Diagnosis Date   Angina pectoris ([place])    history of     Arthritis    Asthma    Cancer ([place])    right breast   Constipation    Diverticulitis    Family history of lung cancer    History of radiation therapy 04/07/18- 05/11/18   50 Gy in 25 fractions to right breast and regional nodes with four fields (no boost)   History of radiation therapy 04/21/2018   T8 spine, 18 Gy in 1 fraction for a total dose of 18 Gy.    HPV in female    Hyperlipidemia    Hypertension    Hypothyroidism    Wears dentures    Wears glasses     PAST SURGICAL HISTORY: Past Surgical History:  Procedure Laterality Date   BREAST LUMPECTOMY WITH RADIOACTIVE SEED AND SENTINEL LYMPH NODE BIOPSY Right 03/08/2018   Procedure: RIGHT BREAST RADIOACTIVE SEED GUIDED LUMPECTOMY WITH RIGHT AXILLARY RADIOACTIVE SEED TARGETED LYMPH NODE EXCISION AND SENTINEL LYMPH NODE BIOPSY, INJECT BLUE DYE RIGHT BREAST;  Surgeon: [name], MD;  Location: [facility];  Service: General;  Laterality: Right;   CESAREAN SECTION     COLON SURGERY  2012   sigmoidectomy   COLOSTOMY     COLOSTOMY TAKEDOWN  10/06/11   IR BONE TUMOR(S)RF ABLATION  11/02/2017   IR KYPHO THORACIC WITH BONE BIOPSY  11/02/2017   IR RADIOLOGIST EVAL & MGMT  10/07/2017   IR RADIOLOGIST EVAL & MGMT  11/25/2017   PORTACATH PLACEMENT N/A 09/25/2017   Procedure: INSERTION PORT-A-CATH WITH ULTRA SOUND ERAS PATHWAY;  Surgeon: [name], MD;  Location: [facility];  Service: General;  Laterality: N/A;   TONSILLECTOMY     TUBAL LIGATION      FAMILY HISTORY: Family History  Problem Relation Age of Onset   Cancer Mother 91       rectal/unknown type   Hypertension Brother    Stroke Brother    Asthma Brother    Hypertension Brother    Asthma Son    Asthma Brother    Diabetes Maternal Aunt    Diabetes Paternal Aunt    Lung cancer Maternal Grandmother 87   Heart Problems Paternal Grandmother    Diabetes Maternal Aunt    Heart attack Maternal Aunt 43   The patient has little information  regarding her father. Her mother died at age 67 from a strange cancer--the patient does not know what it was, it may well have been cervical cancer from her description. The patient has one brother, no sisters. There is no history of breast or ovarian cancer in the family to the patient's knowledge    GYNECOLOGIC HISTORY:  No LMP recorded. Patient is postmenopausal.  menarche age 59, first live birth age 13 the patient is [place] P3. She stopped having periods at age 15. She never used oral contraceptives or hormone replacement.    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  She is divorced.  Her son [name] stays with her sometimes. He is a Art gallery manager and therefore not working during the pandemic. The 2 other children are NAYA ILAGAN, who lives in [place] and works in the theater and  media, and Koreen Lizaola lives in [place], and is Engineer, maintenance of a [employer]. The patient has 5 grandchildren. She is a Psychologist, forensic.   ADVANCED DIRECTIVES: Not in place    HEALTH MAINTENANCE: Social History   Tobacco Use   Smoking status: Current Some Day Smoker    Packs/day: 0.25    Years: 40.00    Pack years: 10.00    Types: Cigarettes   Smokeless tobacco: Never Used   Tobacco comment: she is smoking 2 or more daily, she plans to use nicotine patch.   Substance Use Topics   Alcohol use: Yes    Comment: occasional   Drug use: No    Colonoscopy: September 2012   PAP:  Bone density:09/03/2017    Allergies  Allergen Reactions   Fish Allergy Anaphylaxis and Swelling    Swelling of hands and feet., non shellfish allergy   Other Anaphylaxis    Tree nuts    Ace Inhibitors Other (See Comments) and Cough    Cold like symptoms    Tylenol [Acetaminophen] Other (See Comments)    Headache     Current Outpatient Medications  Medication Sig Dispense Refill   ADVAIR DISKUS 250-50 MCG/DOSE AEPB INL 1 PUFF ITL  Q 12 H     albuterol (PROAIR HFA) 108 (90 Base) MCG/ACT inhaler INHALE 2 PUFFS BY MOUTH INTO THE LUNGS EVERY 6 HOURS AS NEEDED FOR WHEEZING     amLODipine (NORVASC) 10 MG tablet Take 10 mg by mouth daily.      anastrozole (ARIMIDEX) 1 MG tablet TAKE 1 TABLET(1 MG) BY MOUTH DAILY 90 tablet 4   aspirin EC 81 MG tablet Take 1 tablet (81 mg total) by mouth daily. 90 tablet 3   cetirizine (ZYRTEC) 10 MG tablet Take 10 mg by mouth daily as needed for allergies.   11   levothyroxine (SYNTHROID, LEVOTHROID) 50 MCG tablet Take 50 mcg by mouth daily.     lidocaine-prilocaine (EMLA) cream Apply 1 application topically as needed (port access). 30 g 0   losartan-hydrochlorothiazide (HYZAAR) 100-12.5 MG tablet Take 1 tablet by mouth daily.   3   metoprolol succinate (TOPROL-XL) 100 MG 24 hr tablet Take 1 tablet (100 mg total) by mouth daily. 30 tablet 4   naproxen (NAPROSYN) 500 MG tablet TAKE 1 TABLET(500 MG) BY MOUTH TWICE DAILY WITH MEALS FOR BACK OR LEG PAIN     nicotine (NICODERM CQ - DOSED IN MG/24 HOURS) 14 mg/24hr patch Place 1 patch (14 mg total) onto the skin daily. 28 patch 0   nitroGLYCERIN (NITROSTAT) 0.4 MG SL tablet Place 1 tablet (0.4 mg total) under the tongue every 5 (five) minutes as needed for chest pain. 75 tablet 3   potassium chloride (KLOR-CON) 10 MEQ tablet Take 1 tablet (10 mEq total) by mouth daily. 90 tablet 1   PRALUENT 75 MG/ML SOAJ INJECT 75 MG INTO THE SKIN EVERY 14 DAYS 2 mL 0   No current facility-administered medications for this visit.    OBJECTIVE: Middle-aged African-American woman in no acute distress Vitals:   03/08/20 1308  BP: 111/81  Pulse: (!) 124  Resp: 18  Temp: 98.9 F (37.2 C)  SpO2: 97%     Body mass index is 26.57 kg/m.   Wt Readings from Last 3 Encounters:  03/08/20 150 lb (68 kg)  02/22/20 159 lb 4.8 oz (72.3 kg)  01/11/20 167 lb 6.4 oz (75.9 kg)   ECOG FS:1   Sclerae unicteric, EOMs intact Wearing  a mask No cervical or  supraclavicular adenopathy Lungs no rales or rhonchi Heart regular rate and rhythm Abd soft, nontender, positive bowel sounds, no masses palpated MSK no focal spinal tenderness, no upper extremity lymphedema Neuro: nonfocal, well oriented, appropriate affect Breasts: The right breast is status post prior lumpectomy and radiation with no evidence of local recurrence.  The left breast is benign.  Both axillae are benign.   LAB RESULTS:  CMP     Component Value Date/Time   NA 139 02/22/2020 1215   NA 138 12/24/2017 0752   K 3.3 (L) 02/22/2020 1215   K 3.3 (L) 12/24/2017 0752   CL 103 02/22/2020 1215   CO2 25 02/22/2020 1215   CO2 25 12/24/2017 0752   GLUCOSE 100 (H) 02/22/2020 1215   GLUCOSE 99 12/24/2017 0752   BUN 11 02/22/2020 1215   BUN 7.9 12/24/2017 0752   CREATININE 0.75 02/22/2020 1215   CREATININE 0.7 12/24/2017 0752   CALCIUM 11.2 (H) 02/22/2020 1215   CALCIUM 9.2 12/24/2017 0752   PROT 8.0 02/22/2020 1215   PROT 6.3 (L) 12/24/2017 0752   ALBUMIN 2.9 (L) 02/22/2020 1215   ALBUMIN 3.4 (L) 12/24/2017 0752   AST 17 02/22/2020 1215   AST 15 12/24/2017 0752   ALT 9 02/22/2020 1215   ALT 18 12/24/2017 0752   ALKPHOS 320 (H) 02/22/2020 1215   ALKPHOS 93 12/24/2017 0752   BILITOT 0.3 02/22/2020 1215   BILITOT <0.22 12/24/2017 0752   GFRNONAA >60 02/22/2020 1215   GFRAA >60 02/22/2020 1215    No results found for: Ronnald Ramp, A1GS, A2GS, BETS, BETA2SER, GAMS, MSPIKE, SPEI  No results found for: Nils Pyle, [facility]  Lab Results  Component Value Date   WBC 8.0 02/22/2020   NEUTROABS 5.6 02/22/2020   HGB 8.9 (L) 02/22/2020   HCT 29.9 (L) 02/22/2020   MCV 76.7 (L) 02/22/2020   PLT 468 (H) 02/22/2020      Chemistry      Component Value Date/Time   NA 139 02/22/2020 1215   NA 138 12/24/2017 0752   K 3.3 (L) 02/22/2020 1215   K 3.3 (L) 12/24/2017 0752   CL 103 02/22/2020 1215   CO2 25 02/22/2020 1215   CO2 25 12/24/2017 0752     BUN 11 02/22/2020 1215   BUN 7.9 12/24/2017 0752   CREATININE 0.75 02/22/2020 1215   CREATININE 0.7 12/24/2017 0752      Component Value Date/Time   CALCIUM 11.2 (H) 02/22/2020 1215   CALCIUM 9.2 12/24/2017 0752   ALKPHOS 320 (H) 02/22/2020 1215   ALKPHOS 93 12/24/2017 0752   AST 17 02/22/2020 1215   AST 15 12/24/2017 0752   ALT 9 02/22/2020 1215   ALT 18 12/24/2017 0752   BILITOT 0.3 02/22/2020 1215   BILITOT <0.22 12/24/2017 0752       No results found for: LABCA2  No components found for: [id number]  No results found for: HGBA, HGBA2QUANT, HGBFQUANT, HGBSQUAN (Hemoglobinopathy evaluation)   No results found for: LDH  No results found for: IRON, TIBC, IRONPCTSAT (Iron and TIBC)  No results found for: FERRITIN  Urinalysis    Component Value Date/Time   LABSPEC 1.020 02/10/2011 2058   PHURINE 6.0 02/10/2011 2058   HGBUR LARGE (A) 02/10/2011 2058   BILIRUBINUR MODERATE (A) 02/10/2011 2058   KETONESUR 15 (A) 02/10/2011 2058   PROTEINUR >=300 (A) 02/10/2011 2058   UROBILINOGEN >=8.0 02/10/2011 2058   NITRITE NEGATIVE 02/10/2011 2058   LEUKOCYTESUR  02/10/2011 2058    NEGATIVE Biochemical Testing Only. Please order routine urinalysis from main lab if confirmatory testing is needed.     STUDIES: MR LIVER W WO CONTRAST  Result Date: 03/05/2020 CLINICAL DATA:  69 year old female with history of breast cancer with metastatic disease to the liver. Evaluate response to therapy. EXAM: MRI ABDOMEN WITHOUT AND WITH CONTRAST TECHNIQUE: Multiplanar multisequence MR imaging of the abdomen was performed both before and after the administration of intravenous contrast. CONTRAST:  46m GADAVIST GADOBUTROL 1 MMOL/ML IV SOLN COMPARISON:  Abdominal MRI 12/12/2019. FINDINGS: Lower chest: Nodular areas of increased signal intensity are noted in the visualized lung bases, concerning for multifocal pulmonary nodules (poorly evaluated on today's MRI examination secondary to susceptibility  artifact from the aerated lungs). Hepatobiliary: Significant increase in number and size of numerous hepatic lesions, largest of which is in the central aspect of the liver involving predominantly portions of segments 4A and 4B (axial image 20 of series 7 and coronal image 12 of series 3) currently measuring 12.0 x 9.4 x 15.7 cm. This lesion is hypointense on T1 weighted images, hyperintense on T2 weighted images, and demonstrates heterogeneous enhancement on post gadolinium imaging. No intra or extrahepatic biliary ductal dilatation. Gallbladder is normal in appearance. Pancreas: No pancreatic mass. No pancreatic ductal dilatation. No peripancreatic fluid collections or inflammatory changes. Spleen:  Unremarkable. Adrenals/Urinary Tract: Bilateral kidneys are normal in appearance. No hydroureteronephrosis in the visualized portions of the abdomen. Bilateral nodular adrenal form thickening, similar to the prior studies dating back to 2019, favored to reflect hyperplasia. Stomach/Bowel: Visualized portions are unremarkable. Vascular/Lymphatic: Aortic atherosclerosis without evidence. No lymphadenopathy noted in the abdomen. 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 Musculoskeletal: No aggressive appearing osseous lesions are noted in the visualized portions of the skeleton. IMPRESSION: 1. Progressive metastatic disease to the liver, as detailed above. The largest hepatic lesion is again centered in segments 4A and 4B, currently measuring 12.0 x 9.4 x 15.7 cm. 2. No new metastatic disease noted elsewhere in the abdomen. 3. However, there multiple areas of nodular signal intensity noted throughout the visualized lung bases concerning for metastatic disease to the lungs. 4. Aortic atherosclerosis. Electronically Signed   By: DVinnie LangtonM.D.   On: 03/05/2020 08:10    ELIGIBLE FOR AVAILABLE RESEARCH PROTOCOL:  no   ASSESSMENT: 69y.o. [place] woman status post right breast upper outer quadrant and right axillary lymph node biopsy 09/03/2017, both positive for invasive ductal carcinoma, grade 3, estrogen receptor positive, progesterone receptor negative, with an MIB-1 of 90%, the lymph node being HER-2 positive, the breast mass HER-2 negative  (a) staging studies September 24, 2017 show a lytic lesion in T8, possible areas of concern at L1-L2  (b) biopsy/kyphoplasty/osteocool of T8 lesion 11/02/2017 confirms metastatic adenocarcinoma, 20% estrogen receptor positive, progesterone receptor negative  (c) PET scan 11/30/2017 shows no visceral disease, minimal metabolic activity at T8, no other bone lesions  (d) CT of the chest 12/05/2019 shows new lung and liver lesions; bone scan was negative  (1) neoadjuvant chemotherapy with carboplatin, docetaxel, trastuzumab, and pertuzumab started 10/01/2017, completed 6 cycles 01/26/2018  (a) Docetaxel dropped after three cycles and Gemcitabine/Carbo started on 12/15/2017.   (2) trastuzumab to continue indefinitely  (a) echocardiogram 11/38/2018 shows an ejection fraction in the 55-60% range  (b) echocardiogram 06/17/2019 shows an ejection fraction in the 50-55% range  (c) echocardiogram 11/21/2019 shows an ejection fraction in  the 50% range, which is mildly decreased.  (d) trastuzumab discontinued after 01/15/2019 dose with disease progression  (3) right lumpectomy and sentinel lymph node sampling 03/08/2018  Showed a pT1(mic) pN0 invasive ductal carcinoma, grade 2, with negative margins.  (4) adjuvant radiation  04-07-18 to 05-11-18                                              (a) SITE/DOSE:  Right Breast and Axilla, SCV nodes / 50 Gy in 25 fx               (b) radiation to her oligo metastatic disease at T8 (18 Gy) in 1 fraction, 04/21/2018  (5) anastrozole started 05/29/2018  (a) bone density 07/19/2019 shows a T score of -0.9, which is normal.  (b)  discontinued March 2011 with progression  (6) zolendronate started 01/19/2019, repeated every 12 weeks   (7) T-DM1 started 12/15/2020, last (4th) dose 02/22/2020  (a) discontinued after 4th dose with evidence of progression  (8) measurable disease:  (a) liver MRI 12/12/2019 shows multiple hepatic masses, largest measuring 5.1 cm  (b) liver biopsy 12/14/2019 confirms estrogen receptor positive cancer (60% with weak staining intensity), progesterone receptor negative and HER-2 not amplified  (c) repeat liver MRI on 03/05/2020 shows evidence of progression  (9) CARIS results on the 12/14/2019 surgical sample show a pathogenic BRCA2 variant (exon 11, P.154 5 FS), a high degree of genomic loss of heterozygosity, negative AKA T1, negative androgen receptor, negative BRCA1, negative HER-2, negative PD-L1, and negative PI K3 CA.  (a) olaparib & lowers are options in this case  (10) to resume trastuzumab 03/18 /2021  (11) to start letrozole and palbociclib 03/15/2020  PLAN: Zita has a biphenotypic stage IV breast cancer, part of which is HER2 positive, and part of which is not.  The tumor is estrogen receptor positive.  Despite that she had disease progression on anastrozole and T-DM1.  Accordingly we are going off those medications.  We have many options here and certainly we could go to further chemotherapy treatments but what I would like to do is switch back to trastuzumab alone for the HER-2 positive component, and intensify the antiestrogens with letrozole and palbociclib.  We discussed the possible toxicities side effects and complications of these agents in detail.  She is eager to get started.  I am hopeful we can have specifically the palbociclib on hand by the time she returns to see [place] 03/15/2020.  At that time I can set her up for Herceptin every 28days with lab work and she can start her palbociclib cycling at that time  We discussed the possible toxicities side effects and  complications of these agents.  She is already set up for repeat echocardiogram later this month.  She understands her cancer is not curable but it is treatable.  I was not able to give her a clear prognosis but certainly our initial goal at this point is to get good control of the cancer in the liver.    I gave her advance directive documents today to complete and notarize.    Total encounter time 40 minutes.Chauncey Cruel, MD 03/08/20 4:43 PM Medical Oncology and Hematology [facility] [place], [place] [address] Tel. [phone]    Fax. [phone]    I, Wilburn Mylar, am acting as  scribe for Dr. Sarajane Jews C. Marylynne Keelin.  I, Lurline Del MD, have reviewed the above documentation for accuracy and completeness, and I agree with the above.    *Total Encounter Time as defined by the Centers for Medicare and Medicaid Services includes, in addition to the face-to-face time of a patient visit (documented in the note above) non-face-to-face time: obtaining and reviewing outside history, ordering and reviewing medications, tests or procedures, care coordination (communications with other health care professionals or caregivers) and documentation in the medical record.

## 2020-03-08 ENCOUNTER — Other Ambulatory Visit: Payer: Self-pay

## 2020-03-08 ENCOUNTER — Inpatient Hospital Stay: Payer: Medicare Other | Attending: Adult Health | Admitting: Oncology

## 2020-03-08 VITALS — BP 111/81 | HR 124 | Temp 98.9°F | Resp 18 | Ht 63.0 in | Wt 150.0 lb

## 2020-03-08 DIAGNOSIS — C7951 Secondary malignant neoplasm of bone: Secondary | ICD-10-CM

## 2020-03-08 DIAGNOSIS — C787 Secondary malignant neoplasm of liver and intrahepatic bile duct: Secondary | ICD-10-CM

## 2020-03-08 DIAGNOSIS — F17210 Nicotine dependence, cigarettes, uncomplicated: Secondary | ICD-10-CM | POA: Diagnosis not present

## 2020-03-08 DIAGNOSIS — Z79811 Long term (current) use of aromatase inhibitors: Secondary | ICD-10-CM | POA: Insufficient documentation

## 2020-03-08 DIAGNOSIS — C773 Secondary and unspecified malignant neoplasm of axilla and upper limb lymph nodes: Secondary | ICD-10-CM | POA: Insufficient documentation

## 2020-03-08 DIAGNOSIS — Z79899 Other long term (current) drug therapy: Secondary | ICD-10-CM | POA: Diagnosis not present

## 2020-03-08 DIAGNOSIS — C50411 Malignant neoplasm of upper-outer quadrant of right female breast: Secondary | ICD-10-CM

## 2020-03-08 DIAGNOSIS — Z5112 Encounter for antineoplastic immunotherapy: Secondary | ICD-10-CM | POA: Insufficient documentation

## 2020-03-08 DIAGNOSIS — C7800 Secondary malignant neoplasm of unspecified lung: Secondary | ICD-10-CM | POA: Diagnosis not present

## 2020-03-08 DIAGNOSIS — Z170 Estrogen receptor positive status [ER+]: Secondary | ICD-10-CM

## 2020-03-08 DIAGNOSIS — I10 Essential (primary) hypertension: Secondary | ICD-10-CM | POA: Diagnosis not present

## 2020-03-08 MED ORDER — PALBOCICLIB 125 MG PO CAPS: 125.0000 mg | ORAL_CAPSULE | Freq: Every day | ORAL | 6 refills | Status: DC

## 2020-03-08 MED ORDER — LETROZOLE 2.5 MG PO TABS: 2.5000 mg | ORAL_TABLET | Freq: Every day | ORAL | 4 refills | Status: DC

## 2020-03-08 MED FILL — LETROZOLE 2.5 MG TABLET: 2.5 | 90 days supply | Qty: 90 | Fill #0

## 2020-03-09 NOTE — Telephone Encounter (Signed)
Called again to try and discuss the cost estimate for genetic testing. LVM and requested that she call back.

## 2020-03-14 ENCOUNTER — Telehealth: Payer: Self-pay | Admitting: Pharmacist

## 2020-03-14 ENCOUNTER — Encounter: Payer: Self-pay | Admitting: Oncology

## 2020-03-14 ENCOUNTER — Inpatient Hospital Stay (HOSPITAL_BASED_OUTPATIENT_CLINIC_OR_DEPARTMENT_OTHER): Payer: Medicare Other | Admitting: Adult Health

## 2020-03-14 ENCOUNTER — Inpatient Hospital Stay: Payer: Medicare Other

## 2020-03-14 ENCOUNTER — Other Ambulatory Visit: Payer: Self-pay

## 2020-03-14 ENCOUNTER — Telehealth: Payer: Self-pay

## 2020-03-14 VITALS — BP 121/67 | HR 82 | Temp 98.2°F | Resp 17 | Wt 159.0 lb

## 2020-03-14 DIAGNOSIS — C50411 Malignant neoplasm of upper-outer quadrant of right female breast: Secondary | ICD-10-CM

## 2020-03-14 DIAGNOSIS — E876 Hypokalemia: Secondary | ICD-10-CM

## 2020-03-14 DIAGNOSIS — Z170 Estrogen receptor positive status [ER+]: Secondary | ICD-10-CM

## 2020-03-14 DIAGNOSIS — Z95828 Presence of other vascular implants and grafts: Secondary | ICD-10-CM

## 2020-03-14 DIAGNOSIS — C7951 Secondary malignant neoplasm of bone: Secondary | ICD-10-CM

## 2020-03-14 DIAGNOSIS — C7800 Secondary malignant neoplasm of unspecified lung: Secondary | ICD-10-CM

## 2020-03-14 DIAGNOSIS — C787 Secondary malignant neoplasm of liver and intrahepatic bile duct: Secondary | ICD-10-CM

## 2020-03-14 DIAGNOSIS — Z5112 Encounter for antineoplastic immunotherapy: Secondary | ICD-10-CM | POA: Diagnosis not present

## 2020-03-14 LAB — CBC WITH DIFFERENTIAL (CANCER CENTER ONLY)
Abs Immature Granulocytes: 0.04 10*3/uL (ref 0.00–0.07)
Basophils Absolute: 0 10*3/uL (ref 0.0–0.1)
Basophils Relative: 0 %
Eosinophils Absolute: 0 10*3/uL (ref 0.0–0.5)
Eosinophils Relative: 0 %
HCT: 32.6 % — ABNORMAL LOW (ref 36.0–46.0)
Hemoglobin: 9.6 g/dL — ABNORMAL LOW (ref 12.0–15.0)
Immature Granulocytes: 0 %
Lymphocytes Relative: 12 %
Lymphs Abs: 1.1 10*3/uL (ref 0.7–4.0)
MCH: 21.9 pg — ABNORMAL LOW (ref 26.0–34.0)
MCHC: 29.4 g/dL — ABNORMAL LOW (ref 30.0–36.0)
MCV: 74.4 fL — ABNORMAL LOW (ref 80.0–100.0)
Monocytes Absolute: 1 10*3/uL (ref 0.1–1.0)
Monocytes Relative: 11 %
Neutro Abs: 6.8 10*3/uL (ref 1.7–7.7)
Neutrophils Relative %: 77 %
Platelet Count: 445 10*3/uL — ABNORMAL HIGH (ref 150–400)
RBC: 4.38 MIL/uL (ref 3.87–5.11)
RDW: 22.4 % — ABNORMAL HIGH (ref 11.5–15.5)
WBC Count: 8.9 10*3/uL (ref 4.0–10.5)
nRBC: 0 % (ref 0.0–0.2)

## 2020-03-14 LAB — CMP (CANCER CENTER ONLY)
ALT: 11 U/L (ref 0–44)
AST: 29 U/L (ref 15–41)
Albumin: 2.9 g/dL — ABNORMAL LOW (ref 3.5–5.0)
Alkaline Phosphatase: 437 U/L — ABNORMAL HIGH (ref 38–126)
Anion gap: 11 (ref 5–15)
BUN: 7 mg/dL — ABNORMAL LOW (ref 8–23)
CO2: 28 mmol/L (ref 22–32)
Calcium: 13.7 mg/dL (ref 8.9–10.3)
Chloride: 99 mmol/L (ref 98–111)
Creatinine: 0.86 mg/dL (ref 0.44–1.00)
GFR, Est AFR Am: >60 mL/min (ref >60)
GFR, Estimated: >60 mL/min (ref >60)
Glucose, Bld: 104 mg/dL — ABNORMAL HIGH (ref 70–99)
Potassium: 2.9 mmol/L — CL (ref 3.5–5.1)
Sodium: 138 mmol/L (ref 135–145)
Total Bilirubin: 0.5 mg/dL (ref 0.3–1.2)
Total Protein: 8.2 g/dL — ABNORMAL HIGH (ref 6.5–8.1)

## 2020-03-14 MED ORDER — DIPHENHYDRAMINE HCL 25 MG PO CAPS
25.0000 mg | ORAL_CAPSULE | Freq: Once | ORAL | Status: AC
  Administered 2020-03-14: 25 mg via ORAL

## 2020-03-14 MED ORDER — ACETAMINOPHEN 325 MG PO TABS
650.0000 mg | ORAL_TABLET | Freq: Once | ORAL | Status: AC
  Administered 2020-03-14: 650 mg via ORAL

## 2020-03-14 MED ORDER — PALBOCICLIB 125 MG PO TABS: 125.0000 mg | ORAL_TABLET | Freq: Every day | ORAL | 6 refills | Status: AC

## 2020-03-14 MED ORDER — TRASTUZUMAB-ANNS CHEMO 150 MG IV SOLR
6.0000 mg/kg | Freq: Once | INTRAVENOUS | Status: AC
  Administered 2020-03-14: 399 mg via INTRAVENOUS
  Filled 2020-03-14: qty 19

## 2020-03-14 MED ORDER — ZOLEDRONIC ACID 4 MG/100ML IV SOLN
INTRAVENOUS | Status: AC
  Filled 2020-03-14: qty 100

## 2020-03-14 MED ORDER — HEPARIN SOD (PORK) LOCK FLUSH 100 UNIT/ML IV SOLN
500.0000 [IU] | Freq: Once | INTRAVENOUS | Status: AC | PRN
  Administered 2020-03-14: 500 [IU]
  Filled 2020-03-14: qty 5

## 2020-03-14 MED ORDER — SODIUM CHLORIDE 0.9 % IV SOLN
INTRAVENOUS | Status: AC
  Filled 2020-03-14 (×2): qty 250

## 2020-03-14 MED ORDER — ACETAMINOPHEN 325 MG PO TABS
ORAL_TABLET | ORAL | Status: AC
  Filled 2020-03-14: qty 2

## 2020-03-14 MED ORDER — SODIUM CHLORIDE 0.9% FLUSH
10.0000 mL | INTRAVENOUS | Status: DC | PRN
  Administered 2020-03-14: 10 mL
  Filled 2020-03-14: qty 10

## 2020-03-14 MED ORDER — DIPHENHYDRAMINE HCL 25 MG PO CAPS
ORAL_CAPSULE | ORAL | Status: AC
  Filled 2020-03-14: qty 1

## 2020-03-14 MED ORDER — POTASSIUM CHLORIDE CRYS ER 20 MEQ PO TBCR: 40.0000 meq | EXTENDED_RELEASE_TABLET | Freq: Once | ORAL | Status: DC

## 2020-03-14 MED ORDER — SODIUM CHLORIDE 0.9 % IV SOLN
Freq: Once | INTRAVENOUS | Status: AC
  Filled 2020-03-14: qty 250

## 2020-03-14 MED ORDER — LETROZOLE 2.5 MG PO TABS: 2.5000 mg | ORAL_TABLET | Freq: Every day | ORAL | 4 refills | Status: AC

## 2020-03-14 MED ORDER — SODIUM CHLORIDE 0.9% FLUSH
10.0000 mL | Freq: Once | INTRAVENOUS | Status: AC
  Administered 2020-03-14: 10 mL
  Filled 2020-03-14: qty 10

## 2020-03-14 MED ORDER — ZOLEDRONIC ACID 4 MG/100ML IV SOLN
4.0000 mg | Freq: Once | INTRAVENOUS | Status: AC
  Administered 2020-03-14: 4 mg via INTRAVENOUS

## 2020-03-14 NOTE — Telephone Encounter (Signed)
Oral Oncology Pharmacist Encounter  Received new prescription for Ibrance (palbociclib) for the treatment of metastatic breast cancer in conjunction with letrozole, planned duration until disease progression or unacceptable drug toxicity.  CMP from 03/14/20 assessed, no relevant lab abnormalities. Prescription dose and frequency assessed.   Current medication list in Epic reviewed, one DDIs with palbociclib identified: -Amlodipine: Palbociclib may increase the concentration of amlodipine. Monitor patient for hypotension. No baseline dose adjustment needed.  Prescription has been e-scribed to the [facility] for benefits analysis and approval.  Oral Oncology Clinic will continue to follow for insurance authorization, copayment issues, initial counseling and start date.  Darl Pikes, PharmD, BCPS, BCOP, CPP Hematology/Oncology Clinical Pharmacist [facility] [phone]  03/14/2020 4:00 PM

## 2020-03-14 NOTE — Telephone Encounter (Signed)
Obtained a free trial card for Ibrance for 1 month at $0  [place]  [employer] [id number] ID [id number]  Information sent to [place] Patient [place] Phone [phone] Fax [phone] 03/14/2020 3:24 PM

## 2020-03-14 NOTE — Progress Notes (Signed)
[facility]  [phone] [phone]     ID: Tracie [name] DOB: Nov 09, 1951  MR#: [id number]  RWE#:[id number]  Patient Care Team: Bartholome Bill, MD as PCP - General (Family Medicine) [name], MD as Consulting Physician (General Surgery) Magrinat, [name], MD as Consulting Physician (Oncology) [name], MD as Attending Physician (Radiation Oncology) Mottinger, Markham DDS (Physical Therapy) [name], MD as Consulting Physician (Cardiology) [name], [name], NP as Nurse Practitioner (Hematology and Oncology) OTHER MD:   CHIEF COMPLAINT: Triple positive breast cancer  CURRENT TREATMENT: trastuzumab [Q 28 d]; letrozole; palbociclib   INTERVAL HISTORY: Tracie [name] returns today for follow-up and treatment of her triple positive breast cancer.   She has stage IV cancer, and recent MRI liver showed progression of disease.  Due to this, her treatment was changed to Trastuzumab, Letrozole and Palbociclib.  She did not realize that Letrozole had been prescribed, and she has not yet received the Palbociclib.  S  We are following her tumor markers which have continued to increase.   Results for Tracie [name], Tracie [name] (MRN [id number]) as of 03/14/2020 15:07  Ref. Range 12/07/2019 12:37 12/16/2019 10:05 01/11/2020 13:31 02/01/2020 12:43 02/22/2020 12:15  CA 27.29 Latest Ref Range: 0.0 - 38.6 U/mL 277.1 (H) 263.4 (H) 502.8 (H) 709.7 (H) 832.8 (H)    REVIEW OF SYSTEMS: Tracie [name] has a potassium level of 2.9 today.  She denies any diarrhea, however did undergo a colonoscopy earlier this week.  She also has a calcium level of 13 (corrected).  She notes that she is constipated, but denies any achiness, or other issues.  She notes that she is confused about her medications and treatments, and she is fatigued.  Otherwise, a detailed ROS was non contributory.    HISTORY OF CURRENT ILLNESS: From the original intake note:  Tracie [name] noted a  change in her right breast sometime in July 2018. [facility] to report that she had found a "kernel" in her breast. She was advised to see her primary physician which she did. The patient was then set up for bilateral diagnostic mammography with tomography and right breast ultrasonography at [facility] 09/03/2017. The breast density was category I a. In the upper outer quadrant of the right breast there was a 2.1 cm high density mass, which was palpable. Also in the right breast more posteriorly there was a 1.5 cm high density mass which was felt to be a suspicious lymph node. Ultrasound of the right breast confirmed a 2.1 centimeter lobulated solid mass in the right breast upper outer quadrant 15.8 cm from the nipple in the 10:00 radiant. There was a 1.5 cm oval mass in the right axillary tail with a small rounded masses also suspicious for lymph node involvement. A total of 3 suspicious lymph nodes were identified.  On 09/03/2017 the patient underwent biopsy of the breast mass and the suspicious right axillary lymph node. The final pathology ([id number]) found invasive ductal carcinoma, grade 3, in both. Both tumors were estrogen receptor positive at 70-75%, and both were progesterone receptor negative. Both had an elevated proliferation marker at 90%. The mass in the breast was HER-2 negative, with a signals ratio of 1.25 and the number per cell 1.88. The lymph node mass however was HER-2 positive with a signals ratio of 2.57, and the number per cell 3.60.  The patient's subsequent history is as detailed below.   PAST MEDICAL HISTORY: Past Medical History:  Diagnosis Date  Angina pectoris ([place])    history of    Arthritis    Asthma    Cancer ([place])    right breast   Constipation    Diverticulitis    Family history of lung cancer    History of radiation therapy 04/07/18- 05/11/18   50 Gy in 25 fractions to right breast and regional nodes with four fields (no boost)    History of radiation therapy 04/21/2018   T8 spine, 18 Gy in 1 fraction for a total dose of 18 Gy.    HPV in female    Hyperlipidemia    Hypertension    Hypothyroidism    Wears dentures    Wears glasses     PAST SURGICAL HISTORY: Past Surgical History:  Procedure Laterality Date   BREAST LUMPECTOMY WITH RADIOACTIVE SEED AND SENTINEL LYMPH NODE BIOPSY Right 03/08/2018   Procedure: RIGHT BREAST RADIOACTIVE SEED GUIDED LUMPECTOMY WITH RIGHT AXILLARY RADIOACTIVE SEED TARGETED LYMPH NODE EXCISION AND SENTINEL LYMPH NODE BIOPSY, INJECT BLUE DYE RIGHT BREAST;  Surgeon: [name], MD;  Location: [facility];  Service: General;  Laterality: Right;   CESAREAN SECTION     COLON SURGERY  2012   sigmoidectomy   COLOSTOMY     COLOSTOMY TAKEDOWN  10/06/11   IR BONE TUMOR(S)RF ABLATION  11/02/2017   IR KYPHO THORACIC WITH BONE BIOPSY  11/02/2017   IR RADIOLOGIST EVAL & MGMT  10/07/2017   IR RADIOLOGIST EVAL & MGMT  11/25/2017   PORTACATH PLACEMENT N/A 09/25/2017   Procedure: INSERTION PORT-A-CATH WITH ULTRA SOUND ERAS PATHWAY;  Surgeon: [name], MD;  Location: [facility];  Service: General;  Laterality: N/A;   TONSILLECTOMY     TUBAL LIGATION      FAMILY HISTORY: Family History  Problem Relation Age of Onset   Cancer Mother 20       rectal/unknown type   Hypertension Brother    Stroke Brother    Asthma Brother    Hypertension Brother    Asthma Son    Asthma Brother    Diabetes Maternal Aunt    Diabetes Paternal Aunt    Lung cancer Maternal Grandmother 87   Heart Problems Paternal Grandmother    Diabetes Maternal Aunt    Heart attack Maternal Aunt 43   The patient has little information regarding her father. Her mother died at age 47 from a strange cancer--the patient does not know what it was, it may well have been cervical cancer from her description. The patient has one brother, no sisters. There is no history of breast or ovarian cancer in the family to  the patient's knowledge    GYNECOLOGIC HISTORY:  No LMP recorded. Patient is postmenopausal.  menarche age 89, first live birth age 70 the patient is Tracie [name] P3. She stopped having periods at age 64. She never used oral contraceptives or hormone replacement.    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  She is divorced.  Her son Tracie [name] stays with her sometimes. He is a Art gallery manager and therefore not working during the pandemic. The 2 other children are Tracie [name], who lives in [place] and works in the theater and media, and Tracie [name] lives in [place], and is vice president of a [employer]. The patient has 5 grandchildren. She is a Psychologist, forensic.   ADVANCED DIRECTIVES: Not in place    HEALTH MAINTENANCE: Social History   Tobacco Use   Smoking  status: Current Some Day Smoker    Packs/day: 0.25    Years: 40.00    Pack years: 10.00    Types: Cigarettes   Smokeless tobacco: Never Used   Tobacco comment: she is smoking 2 or more daily, she plans to use nicotine patch.   Substance Use Topics   Alcohol use: Yes    Comment: occasional   Drug use: No    Colonoscopy: September 2012   PAP:  Bone density:09/03/2017    Allergies  Allergen Reactions   Fish Allergy Anaphylaxis and Swelling    Swelling of hands and feet., non shellfish allergy   Other Anaphylaxis    Tree nuts    Ace Inhibitors Other (See Comments) and Cough    Cold like symptoms    Tylenol [Acetaminophen] Other (See Comments)    Headache     Current Outpatient Medications  Medication Sig Dispense Refill   ADVAIR DISKUS 250-50 MCG/DOSE AEPB INL 1 PUFF ITL Q 12 H     albuterol (PROAIR HFA) 108 (90 Base) MCG/ACT inhaler INHALE 2 PUFFS BY MOUTH INTO THE LUNGS EVERY 6 HOURS AS NEEDED FOR WHEEZING     amLODipine (NORVASC) 10 MG tablet Take 10 mg by mouth daily.      aspirin EC 81 MG tablet Take 1 tablet (81 mg total) by mouth  daily. 90 tablet 3   cetirizine (ZYRTEC) 10 MG tablet Take 10 mg by mouth daily as needed for allergies.   11   letrozole (FEMARA) 2.5 MG tablet Take 1 tablet (2.5 mg total) by mouth daily. 90 tablet 4   levothyroxine (SYNTHROID, LEVOTHROID) 50 MCG tablet Take 50 mcg by mouth daily.     lidocaine-prilocaine (EMLA) cream Apply 1 application topically as needed (port access). 30 g 0   losartan-hydrochlorothiazide (HYZAAR) 100-12.5 MG tablet Take 1 tablet by mouth daily.   3   metoprolol succinate (TOPROL-XL) 100 MG 24 hr tablet Take 1 tablet (100 mg total) by mouth daily. 30 tablet 4   naproxen (NAPROSYN) 500 MG tablet TAKE 1 TABLET(500 MG) BY MOUTH TWICE DAILY WITH MEALS FOR BACK OR LEG PAIN     nicotine (NICODERM CQ - DOSED IN MG/24 HOURS) 14 mg/24hr patch Place 1 patch (14 mg total) onto the skin daily. 28 patch 0   nitroGLYCERIN (NITROSTAT) 0.4 MG SL tablet Place 1 tablet (0.4 mg total) under the tongue every 5 (five) minutes as needed for chest pain. 75 tablet 3   palbociclib (IBRANCE) 125 MG capsule Take 1 capsule (125 mg total) by mouth daily with breakfast. Take whole with food. Take for 21 days on, 7 days off, repeat every 28 days. 21 capsule 6   potassium chloride (KLOR-CON) 10 MEQ tablet Take 1 tablet (10 mEq total) by mouth daily. 90 tablet 1   PRALUENT 75 MG/ML SOAJ INJECT 75 MG INTO THE SKIN EVERY 14 DAYS 2 mL 0   No current facility-administered medications for this visit.   Facility-Administered Medications Ordered in Other Visits  Medication Dose Route Frequency Provider Last Rate Last Admin   0.9 %  sodium chloride infusion   Intravenous Continuous Gardenia Phlegm, NP 750 mL/hr at 03/14/20 1404 New Bag at 03/14/20 1404   heparin lock flush 100 unit/mL  500 Units Intracatheter Once PRN Magrinat, [name], MD       sodium chloride flush (NS) 0.9 % injection 10 mL  10 mL Intracatheter PRN Magrinat, [name], MD       trastuzumab-anns [facility]) 399  mg in  sodium chloride 0.9 % 250 mL chemo infusion  6 mg/kg (Treatment Plan Recorded) Intravenous Once Magrinat, [name], MD        OBJECTIVE: Middle-aged African-American woman in no acute distress There were no vitals filed for this visit.   There is no height or weight on file to calculate BMI.   Wt Readings from Last 3 Encounters:  03/14/20 159 lb (72.1 kg)  03/08/20 150 lb (68 kg)  02/22/20 159 lb 4.8 oz (72.3 kg)   ECOG FS:1   GENERAL: Patient is a well appearing female in no acute distress, examined in chair in chemo room HEENT:  Sclerae anicteric.  Mask in place. Neck is supple.  NODES:  No cervical, supraclavicular, or axillary lymphadenopathy palpated.  BREAST EXAM:  Deferred. LUNGS:  Clear to auscultation bilaterally.  No wheezes or rhonchi. HEART:  Regular rate and rhythm. No murmur appreciated. ABDOMEN:  Soft, nontender.  Positive, normoactive bowel sounds. No organomegaly palpated. EXTREMITIES:  No peripheral edema.   SKIN:  Clear with no obvious rashes or skin changes. No nail dyscrasia. NEURO:  Nonfocal. Well oriented.  Appropriate affect.     LAB RESULTS:  CMP     Component Value Date/Time   NA 138 03/14/2020 1248   NA 138 12/24/2017 0752   K 2.9 (LL) 03/14/2020 1248   K 3.3 (L) 12/24/2017 0752   CL 99 03/14/2020 1248   CO2 28 03/14/2020 1248   CO2 25 12/24/2017 0752   GLUCOSE 104 (H) 03/14/2020 1248   GLUCOSE 99 12/24/2017 0752   BUN 7 (L) 03/14/2020 1248   BUN 7.9 12/24/2017 0752   CREATININE 0.86 03/14/2020 1248   CREATININE 0.7 12/24/2017 0752   CALCIUM 13.7 (HH) 03/14/2020 1248   CALCIUM 9.2 12/24/2017 0752   PROT 8.2 (H) 03/14/2020 1248   PROT 6.3 (L) 12/24/2017 0752   ALBUMIN 2.9 (L) 03/14/2020 1248   ALBUMIN 3.4 (L) 12/24/2017 0752   AST 29 03/14/2020 1248   AST 15 12/24/2017 0752   ALT 11 03/14/2020 1248   ALT 18 12/24/2017 0752   ALKPHOS 437 (H) 03/14/2020 1248   ALKPHOS 93 12/24/2017 0752   BILITOT 0.5 03/14/2020 1248   BILITOT <0.22  12/24/2017 0752   GFRNONAA >60 03/14/2020 1248   GFRAA >60 03/14/2020 1248    No results found for: TOTALPROTELP, ALBUMINELP, A1GS, A2GS, BETS, BETA2SER, GAMS, MSPIKE, SPEI  No results found for: Nils Pyle, KAPLAMBRATIO  Lab Results  Component Value Date   WBC 8.9 03/14/2020   NEUTROABS 6.8 03/14/2020   HGB 9.6 (L) 03/14/2020   HCT 32.6 (L) 03/14/2020   MCV 74.4 (L) 03/14/2020   PLT 445 (H) 03/14/2020      Chemistry      Component Value Date/Time   NA 138 03/14/2020 1248   NA 138 12/24/2017 0752   K 2.9 (LL) 03/14/2020 1248   K 3.3 (L) 12/24/2017 0752   CL 99 03/14/2020 1248   CO2 28 03/14/2020 1248   CO2 25 12/24/2017 0752   BUN 7 (L) 03/14/2020 1248   BUN 7.9 12/24/2017 0752   CREATININE 0.86 03/14/2020 1248   CREATININE 0.7 12/24/2017 0752      Component Value Date/Time   CALCIUM 13.7 (HH) 03/14/2020 1248   CALCIUM 9.2 12/24/2017 0752   ALKPHOS 437 (H) 03/14/2020 1248   ALKPHOS 93 12/24/2017 0752   AST 29 03/14/2020 1248   AST 15 12/24/2017 0752   ALT 11 03/14/2020 1248   ALT 18  12/24/2017 0752   BILITOT 0.5 03/14/2020 1248   BILITOT <0.22 12/24/2017 0752       No results found for: LABCA2  No components found for: GOTLXB262  No results found for: HGBA, HGBA2QUANT, HGBFQUANT, HGBSQUAN (Hemoglobinopathy evaluation)   No results found for: LDH  No results found for: IRON, TIBC, IRONPCTSAT (Iron and TIBC)  No results found for: FERRITIN  Urinalysis    Component Value Date/Time   LABSPEC 1.020 02/10/2011 2058   PHURINE 6.0 02/10/2011 2058   HGBUR LARGE (A) 02/10/2011 2058   BILIRUBINUR MODERATE (A) 02/10/2011 2058   KETONESUR 15 (A) 02/10/2011 2058   PROTEINUR >=300 (A) 02/10/2011 2058   UROBILINOGEN >=8.0 02/10/2011 2058   NITRITE NEGATIVE 02/10/2011 2058   LEUKOCYTESUR  02/10/2011 2058    NEGATIVE Biochemical Testing Only. Please order routine urinalysis from main lab if confirmatory testing is needed.     STUDIES: MR  LIVER W WO CONTRAST  Result Date: 03/05/2020 CLINICAL DATA:  69 year old female with history of breast cancer with metastatic disease to the liver. Evaluate response to therapy. EXAM: MRI ABDOMEN WITHOUT AND WITH CONTRAST TECHNIQUE: Multiplanar multisequence MR imaging of the abdomen was performed both before and after the administration of intravenous contrast. CONTRAST:  27m GADAVIST GADOBUTROL 1 MMOL/ML IV SOLN COMPARISON:  Abdominal MRI 12/12/2019. FINDINGS: Lower chest: Nodular areas of increased signal intensity are noted in the visualized lung bases, concerning for multifocal pulmonary nodules (poorly evaluated on today's MRI examination secondary to susceptibility artifact from the aerated lungs). Hepatobiliary: Significant increase in number and size of numerous hepatic lesions, largest of which is in the central aspect of the liver involving predominantly portions of segments 4A and 4B (axial image 20 of series 7 and coronal image 12 of series 3) currently measuring 12.0 x 9.4 x 15.7 cm. This lesion is hypointense on T1 weighted images, hyperintense on T2 weighted images, and demonstrates heterogeneous enhancement on post gadolinium imaging. No intra or extrahepatic biliary ductal dilatation. Gallbladder is normal in appearance. Pancreas: No pancreatic mass. No pancreatic ductal dilatation. No peripancreatic fluid collections or inflammatory changes. Spleen:  Unremarkable. Adrenals/Urinary Tract: Bilateral kidneys are normal in appearance. No hydroureteronephrosis in the visualized portions of the abdomen. Bilateral nodular adrenal form thickening, similar to the prior studies dating back to 2019, favored to reflect hyperplasia. Stomach/Bowel: Visualized portions are unremarkable. Vascular/Lymphatic: Aortic atherosclerosis without evidence. No lymphadenopathy noted in the abdomen. 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 Musculoskeletal: No aggressive appearing osseous lesions are noted in the visualized portions of the skeleton. IMPRESSION: 1. Progressive metastatic disease to the liver, as detailed above. The largest hepatic lesion is again centered in segments 4A and 4B, currently measuring 12.0 x 9.4 x 15.7 cm. 2. No new metastatic disease noted elsewhere in the abdomen. 3. However, there multiple areas of nodular signal intensity noted throughout the visualized lung bases concerning for metastatic disease to the lungs. 4. Aortic atherosclerosis. Electronically Signed   By: DVinnie LangtonM.D.   On: 03/05/2020 08:10    ELIGIBLE FOR AVAILABLE RESEARCH PROTOCOL: no   ASSESSMENT: 69y.o. [place] woman status post right breast upper outer quadrant and right axillary lymph node biopsy 09/03/2017, both positive for invasive ductal carcinoma, grade 3, estrogen receptor positive, progesterone receptor negative, with an MIB-1 of 90%, the lymph node being HER-2 positive, the breast mass HER-2 negative  (a) staging studies September 24, 2017 show a  lytic lesion in T8, possible areas of concern at L1-L2  (b) biopsy/kyphoplasty/osteocool of T8 lesion 11/02/2017 confirms metastatic adenocarcinoma, 20% estrogen receptor positive, progesterone receptor negative  (c) PET scan 11/30/2017 shows no visceral disease, minimal metabolic activity at T8, no other bone lesions  (d) CT of the chest 12/05/2019 shows new lung and liver lesions; bone scan was negative  (1) neoadjuvant chemotherapy with carboplatin, docetaxel, trastuzumab, and pertuzumab started 10/01/2017, completed 6 cycles 01/26/2018  (a) Docetaxel dropped after three cycles and Gemcitabine/Carbo started on 12/15/2017.   (2) trastuzumab to continue indefinitely  (a) echocardiogram 11/38/2018 shows an ejection fraction in the 55-60% range  (b) echocardiogram 06/17/2019 shows an ejection fraction in the 50-55% range  (c) echocardiogram 11/21/2019  shows an ejection fraction in the 50% range, which is mildly decreased.  (d) trastuzumab discontinued after 01/15/2019 dose with disease progression  (3) right lumpectomy and sentinel lymph node sampling 03/08/2018  Showed a pT1(mic) pN0 invasive ductal carcinoma, grade 2, with negative margins.  (4) adjuvant radiation  04-07-18 to 05-11-18                                              (a) SITE/DOSE:  Right Breast and Axilla, SCV nodes / 50 Gy in 25 fx               (b) radiation to her oligo metastatic disease at T8 (18 Gy) in 1 fraction, 04/21/2018  (5) anastrozole started 05/29/2018  (a) bone density 07/19/2019 shows a T score of -0.9, which is normal.  (b) discontinued March 2011 with progression  (6) zolendronate started 01/19/2019, repeated every 12 weeks   (7) T-DM1 started 12/15/2020, last (4th) dose 02/22/2020  (a) discontinued after 4th dose with evidence of progression  (8) measurable disease:  (a) liver MRI 12/12/2019 shows multiple hepatic masses, largest measuring 5.1 cm  (b) liver biopsy 12/14/2019 confirms estrogen receptor positive cancer (60% with weak staining intensity), progesterone receptor negative and HER-2 not amplified  (c) repeat liver MRI on 03/05/2020 shows evidence of progression  (9) CARIS results on the 12/14/2019 surgical sample show a pathogenic BRCA2 variant (exon 11, P.154 5 FS), a high degree of genomic loss of heterozygosity, negative AKA T1, negative androgen receptor, negative BRCA1, negative HER-2, negative PD-L1, and negative PI K3 CA.  (a) olaparib & lowers are options in this case  (10) to resume trastuzumab 03/18 /2021  (11) to start letrozole and palbociclib 03/15/2020  PLAN: Cyd will go ahead and receive her Trastuzumab today.  Her hypercalcemia is likely secondary to her metastases.  She will receive Zometa today as well as one liter of IV fluids.  I confirmed that she should not take any extra calcium, and to avoid foods rich in  calcium.  She will also drink Dr. [name] recipe for electrolyte replenishment.    Lagina has not yet received her letrozole or Palbociclib.  I will put in orders for her letrozole to go to [facility].  I have reached out to Wynn Maudlin in pharmacy about the status of her Palbociclib.    Korynn will return in 4 weeks for labs, f/u, and her next trastuzumab.  She was recommended to continue with the appropriate pandemic precautions. She knows to call for any questions that may arise between now and her next appointment.  We are happy to see her sooner if needed.  I reviewed the above with Dr. Jana Hakim in detail who is in agreement with plan.  Total encounter time: 30 minutes*  Wilber Bihari, NP 03/14/20 3:15 PM Medical Oncology and Hematology [facility] [place], [place] [address] Tel. [phone]    Fax. [phone]    *Total Encounter Time as defined by the Centers for Medicare and Medicaid Services includes, in addition to the face-to-face time of a patient visit (documented in the note above) non-face-to-face time: obtaining and reviewing outside history, ordering and reviewing medications, tests or procedures, care coordination (communications with other health care professionals or caregivers) and documentation in the medical record.   Wilber Bihari, NP 03/14/20 2:58 PM Medical Oncology and Hematology [facility] [place], [place] [address] Tel. [phone]    Fax. [phone]     *Total Encounter Time as defined by the Centers for Medicare and Medicaid Services includes, in addition to the face-to-face time of a patient visit (documented in the note above) non-face-to-face time: obtaining and reviewing outside history, ordering and reviewing medications, tests or procedures, care coordination (communications with other health care professionals or caregivers) and documentation in the medical record.

## 2020-03-14 NOTE — Telephone Encounter (Signed)
Critical labs of K 2.9 and Ca 13.7 relayed to Rogue Jury, Therapist, sports.

## 2020-03-14 NOTE — Progress Notes (Signed)
Spoke w/ Seth Bake - since patient is not being reloaded with trastuzumab and has received in the past, infusion time will be 30 minutes today. This is reflected in the order.   Demetrius Charity, PharmD, BCPS, [facility] Pharmacist Pharmacy Phone: [phone] 03/14/2020

## 2020-03-14 NOTE — Progress Notes (Signed)
Wilber Bihari NP saw patient in infusion and would like for patient to receive Zometa prior to receiving Kanjinti today. Patient also to receive 1LNS.

## 2020-03-14 NOTE — Patient Instructions (Signed)
[facility] Discharge Instructions for Patients Receiving Chemotherapy  Today you received the following chemotherapy agents: Trastuzumab (Kanjinti)  To help prevent nausea and vomiting after your treatment, we encourage you to take your nausea medication as directed.   If you develop nausea and vomiting that is not controlled by your nausea medication, call the clinic.   BELOW ARE SYMPTOMS THAT SHOULD BE REPORTED IMMEDIATELY:  *FEVER GREATER THAN 100.5 F  *CHILLS WITH OR WITHOUT FEVER  NAUSEA AND VOMITING THAT IS NOT CONTROLLED WITH YOUR NAUSEA MEDICATION  *UNUSUAL SHORTNESS OF BREATH  *UNUSUAL BRUISING OR BLEEDING  TENDERNESS IN MOUTH AND THROAT WITH OR WITHOUT PRESENCE OF ULCERS  *URINARY PROBLEMS  *BOWEL PROBLEMS  UNUSUAL RASH Items with * indicate a potential emergency and should be followed up as soon as possible.  Feel free to call the clinic should you have any questions or concerns. The clinic phone number is [phone].  Please show the [facility] at check-in to the Emergency Department and triage nurse.  Trastuzumab injection for infusion What is this medicine? TRASTUZUMAB (tras TOO zoo mab) is a monoclonal antibody. It is used to treat breast cancer and stomach cancer. This medicine may be used for other purposes; ask your health care provider or pharmacist if you have questions. COMMON BRAND NAME(S): Herceptin, Galvin Proffer, Trazimera What should I tell my health care provider before I take this medicine? They need to know if you have any of these conditions:  heart disease  heart failure  lung or breathing disease, like asthma  an unusual or allergic reaction to trastuzumab, benzyl alcohol, or other medications, foods, dyes, or preservatives  pregnant or trying to get pregnant  breast-feeding How should I use this medicine? This drug is given as an infusion into a vein. It is administered in a hospital  or clinic by a specially trained health care professional. Talk to your pediatrician regarding the use of this medicine in children. This medicine is not approved for use in children. Overdosage: If you think you have taken too much of this medicine contact a poison control center or emergency room at once. NOTE: This medicine is only for you. Do not share this medicine with others. What if I miss a dose? It is important not to miss a dose. Call your doctor or health care professional if you are unable to keep an appointment. What may interact with this medicine? This medicine may interact with the following medications:  certain types of chemotherapy, such as daunorubicin, doxorubicin, epirubicin, and idarubicin This list may not describe all possible interactions. Give your health care provider a list of all the medicines, herbs, non-prescription drugs, or dietary supplements you use. Also tell them if you smoke, drink alcohol, or use illegal drugs. Some items may interact with your medicine. What should I watch for while using this medicine? Visit your doctor for checks on your progress. Report any side effects. Continue your course of treatment even though you feel ill unless your doctor tells you to stop. Call your doctor or health care professional for advice if you get a fever, chills or sore throat, or other symptoms of a cold or flu. Do not treat yourself. Try to avoid being around people who are sick. You may experience fever, chills and shaking during your first infusion. These effects are usually mild and can be treated with other medicines. Report any side effects during the infusion to your health care professional. Fever and  chills usually do not happen with later infusions. Do not become pregnant while taking this medicine or for 7 months after stopping it. Women should inform their doctor if they wish to become pregnant or think they might be pregnant. Women of child-bearing potential  will need to have a negative pregnancy test before starting this medicine. There is a potential for serious side effects to an unborn child. Talk to your health care professional or pharmacist for more information. Do not breast-feed an infant while taking this medicine or for 7 months after stopping it. Women must use effective birth control with this medicine. What side effects may I notice from receiving this medicine? Side effects that you should report to your doctor or health care professional as soon as possible:  allergic reactions like skin rash, itching or hives, swelling of the face, lips, or tongue  chest pain or palpitations  cough  dizziness  feeling faint or lightheaded, falls  fever  general ill feeling or flu-like symptoms  signs of worsening heart failure like breathing problems; swelling in your legs and feet  unusually weak or tired Side effects that usually do not require medical attention (report to your doctor or health care professional if they continue or are bothersome):  bone pain  changes in taste  diarrhea  joint pain  nausea/vomiting  weight loss This list may not describe all possible side effects. Call your doctor for medical advice about side effects. You may report side effects to FDA at 1-800-FDA-1088. Where should I keep my medicine? This drug is given in a hospital or clinic and will not be stored at home. NOTE: This sheet is a summary. It may not cover all possible information. If you have questions about this medicine, talk to your doctor, pharmacist, or health care provider.  2020 Elsevier/Gold Standard (2016-12-09 14:37:52)  Zoledronic Acid injection (Hypercalcemia, Oncology) What is this medicine? ZOLEDRONIC ACID (ZOE le dron ik AS id) lowers the amount of calcium loss from bone. It is used to treat too much calcium in your blood from cancer. It is also used to prevent complications of cancer that has spread to the bone. This  medicine may be used for other purposes; ask your health care provider or pharmacist if you have questions. COMMON BRAND NAME(S): Zometa What should I tell my health care provider before I take this medicine? They need to know if you have any of these conditions:  aspirin-sensitive asthma  cancer, especially if you are receiving medicines used to treat cancer  dental disease or wear dentures  infection  kidney disease  receiving corticosteroids like dexamethasone or prednisone  an unusual or allergic reaction to zoledronic acid, other medicines, foods, dyes, or preservatives  pregnant or trying to get pregnant  breast-feeding How should I use this medicine? This medicine is for infusion into a vein. It is given by a health care professional in a hospital or clinic setting. Talk to your pediatrician regarding the use of this medicine in children. Special care may be needed. Overdosage: If you think you have taken too much of this medicine contact a poison control center or emergency room at once. NOTE: This medicine is only for you. Do not share this medicine with others. What if I miss a dose? It is important not to miss your dose. Call your doctor or health care professional if you are unable to keep an appointment. What may interact with this medicine?  certain antibiotics given by injection  NSAIDs, medicines  for pain and inflammation, like ibuprofen or naproxen  some diuretics like bumetanide, furosemide  teriparatide  thalidomide This list may not describe all possible interactions. Give your health care provider a list of all the medicines, herbs, non-prescription drugs, or dietary supplements you use. Also tell them if you smoke, drink alcohol, or use illegal drugs. Some items may interact with your medicine. What should I watch for while using this medicine? Visit your doctor or health care professional for regular checkups. It may be some time before you see the  benefit from this medicine. Do not stop taking your medicine unless your doctor tells you to. Your doctor may order blood tests or other tests to see how you are doing. Women should inform their doctor if they wish to become pregnant or think they might be pregnant. There is a potential for serious side effects to an unborn child. Talk to your health care professional or pharmacist for more information. You should make sure that you get enough calcium and vitamin D while you are taking this medicine. Discuss the foods you eat and the vitamins you take with your health care professional. Some people who take this medicine have severe bone, joint, and/or muscle pain. This medicine may also increase your risk for jaw problems or a broken thigh bone. Tell your doctor right away if you have severe pain in your jaw, bones, joints, or muscles. Tell your doctor if you have any pain that does not go away or that gets worse. Tell your dentist and dental surgeon that you are taking this medicine. You should not have major dental surgery while on this medicine. See your dentist to have a dental exam and fix any dental problems before starting this medicine. Take good care of your teeth while on this medicine. Make sure you see your dentist for regular follow-up appointments. What side effects may I notice from receiving this medicine? Side effects that you should report to your doctor or health care professional as soon as possible:  allergic reactions like skin rash, itching or hives, swelling of the face, lips, or tongue  anxiety, confusion, or depression  breathing problems  changes in vision  eye pain  feeling faint or lightheaded, falls  jaw pain, especially after dental work  mouth sores  muscle cramps, stiffness, or weakness  redness, blistering, peeling or loosening of the skin, including inside the mouth  trouble passing urine or change in the amount of urine Side effects that usually do  not require medical attention (report to your doctor or health care professional if they continue or are bothersome):  bone, joint, or muscle pain  constipation  diarrhea  fever  hair loss  irritation at site where injected  loss of appetite  nausea, vomiting  stomach upset  trouble sleeping  trouble swallowing  weak or tired This list may not describe all possible side effects. Call your doctor for medical advice about side effects. You may report side effects to FDA at 1-800-FDA-1088. Where should I keep my medicine? This drug is given in a hospital or clinic and will not be stored at home. NOTE: This sheet is a summary. It may not cover all possible information. If you have questions about this medicine, talk to your doctor, pharmacist, or health care provider.  2020 Elsevier/Gold Standard (2014-05-13 14:19:39)

## 2020-03-14 NOTE — Telephone Encounter (Signed)
Oral Oncology Patient Advocate Encounter  After completing a benefits investigation, prior authorization for Tracie [name]  is not required at this time through [place] .  Patient's copay is $2892.51.     [facility] Patient [place] Phone [phone] Fax [phone] 03/14/2020 3:20 PM

## 2020-03-15 LAB — CANCER ANTIGEN 27.29: CA 27.29: 1189.4 U/mL — ABNORMAL HIGH (ref 0.0–38.6)

## 2020-03-16 ENCOUNTER — Telehealth: Payer: Self-pay | Admitting: Adult Health

## 2020-03-16 NOTE — Telephone Encounter (Signed)
Checked pt out. No changes made to schedule, no 3/17 los.

## 2020-03-19 ENCOUNTER — Other Ambulatory Visit: Payer: Self-pay | Admitting: *Deleted

## 2020-03-19 MED ORDER — ONDANSETRON HCL 8 MG PO TABS: 8.0000 mg | ORAL_TABLET | Freq: Every day | ORAL | 3 refills | Status: AC | PRN

## 2020-03-19 MED FILL — IBRANCE 125 MG TABS: 125 | 21 days supply | Qty: 21 | Fill #0

## 2020-03-19 NOTE — Telephone Encounter (Signed)
Thank you. GM 

## 2020-03-19 NOTE — Telephone Encounter (Signed)
Oral Chemotherapy Pharmacist Encounter  We were finally ale to reach Tracie [name]. [place] will deliver the [place] on 03/20/20. She knows to get started when she receives the medication.   Filled using the one-time voucher. Patient knows we will need her to come into sign a manufacturer application and bring income documentation to stay on medication.  Patient Education I spoke with patient for overview of new oral chemotherapy medication: Ibrance (palbociclib) for the treatment of metastatic breast cancer in conjunction with letrozole, planned duration until disease progression or unacceptable drug toxicity.   Pt is doing well. Counseled patient on administration, dosing, side effects, monitoring, drug-food interactions, safe handling, storage, and disposal. Patient will take 1 tablet (125 mg total) by mouth daily. Take for 21 days on, 7 days off, repeat every 28 days.  Side effects include but not limited to: decreased wbc, N/V, fatigue.    Patient wanted to have some nausea medication on hand on for prn use. Called Val, RN to let her know about this patient request.  Reviewed with patient importance of keeping a medication schedule and plan for any missed doses.  [name] voiced understanding and appreciation. All questions answered. Medication handout placed in the mail.  Provided patient with [facility] phone number. Patient knows to call the office with questions or concerns. Oral Chemotherapy Navigation Clinic will continue to follow.  Darl Pikes, PharmD, BCPS, BCOP, CPP Hematology/Oncology Clinical Pharmacist [facility] [phone]  03/19/2020 3:00 PM

## 2020-03-20 ENCOUNTER — Other Ambulatory Visit: Payer: Self-pay

## 2020-03-20 ENCOUNTER — Ambulatory Visit (HOSPITAL_COMMUNITY)
Admission: RE | Admit: 2020-03-20 | Discharge: 2020-03-20 | Disposition: A | Discharge status: Home / Self Care | Payer: Medicare Other | Source: Ambulatory Visit | Attending: Family Medicine | Admitting: Family Medicine

## 2020-03-20 ENCOUNTER — Encounter (HOSPITAL_COMMUNITY): Payer: Medicare Other | Admitting: Cardiology

## 2020-03-20 DIAGNOSIS — C50411 Malignant neoplasm of upper-outer quadrant of right female breast: Secondary | ICD-10-CM

## 2020-03-20 DIAGNOSIS — Z170 Estrogen receptor positive status [ER+]: Secondary | ICD-10-CM

## 2020-03-22 ENCOUNTER — Telehealth: Payer: Self-pay

## 2020-03-22 NOTE — Telephone Encounter (Signed)
Oral Oncology Patient Advocate Encounter   Was successful in securing patient a $16000 grant from [employer] [place] ([employer]) to provide copayment coverage for Ibrance.  This will keep the out of pocket expense at $0.     I have spoken with the patient.    The billing information is as follows and has been shared with [facility].   RxBin: [id number] PCN:  [id number] Member ID: [id number] Group ID: [id number] Dates of Eligibility: 03/22/20 through 03/22/21  Tracie [name] Patient [place] [facility] Phone [phone] Fax [phone] 03/22/2020 2:52 PM

## 2020-03-26 ENCOUNTER — Other Ambulatory Visit: Payer: Self-pay

## 2020-03-26 ENCOUNTER — Ambulatory Visit (HOSPITAL_COMMUNITY)
Admission: RE | Admit: 2020-03-26 | Discharge: 2020-03-26 | Disposition: A | Discharge status: Home / Self Care | Payer: Medicare Other | Source: Ambulatory Visit | Attending: Cardiology | Admitting: Cardiology

## 2020-03-26 DIAGNOSIS — C50411 Malignant neoplasm of upper-outer quadrant of right female breast: Secondary | ICD-10-CM

## 2020-03-26 DIAGNOSIS — I351 Nonrheumatic aortic (valve) insufficiency: Secondary | ICD-10-CM | POA: Diagnosis not present

## 2020-03-26 DIAGNOSIS — Z170 Estrogen receptor positive status [ER+]: Secondary | ICD-10-CM | POA: Diagnosis not present

## 2020-03-26 DIAGNOSIS — Z01818 Encounter for other preprocedural examination: Secondary | ICD-10-CM | POA: Diagnosis present

## 2020-03-26 DIAGNOSIS — E785 Hyperlipidemia, unspecified: Secondary | ICD-10-CM | POA: Insufficient documentation

## 2020-03-26 DIAGNOSIS — I110 Hypertensive heart disease with heart failure: Secondary | ICD-10-CM | POA: Diagnosis not present

## 2020-03-26 DIAGNOSIS — I509 Heart failure, unspecified: Secondary | ICD-10-CM | POA: Diagnosis not present

## 2020-03-26 NOTE — Progress Notes (Signed)
Echocardiogram 2D Echocardiogram has been performed.  [place] Deputy Syesha Thaw 03/26/2020, 1:47 PM

## 2020-03-29 ENCOUNTER — Telehealth: Payer: Self-pay | Admitting: Oncology

## 2020-03-29 NOTE — Telephone Encounter (Signed)
R/s appt per 4/1 sch msg - pt is aware of change

## 2020-03-30 ENCOUNTER — Other Ambulatory Visit: Payer: Self-pay | Admitting: Genetic Counselor

## 2020-03-30 ENCOUNTER — Telehealth: Payer: Self-pay | Admitting: Genetic Counselor

## 2020-03-30 DIAGNOSIS — C50411 Malignant neoplasm of upper-outer quadrant of right female breast: Secondary | ICD-10-CM

## 2020-03-30 NOTE — Telephone Encounter (Signed)
Followed up with Ms. Tracie [name] about genetic testing. We discussed that her out of pocket cost for genetic testing is estimated to be $0. Ms. Tracie [name] is comfortable proceeding with genetic testing at this time.   After considering the risks, benefits, and limitations, Ms. Tracie [name] provided informed consent to pursue genetic testing and the blood sample will be sent to [facility] for analysis of the BRCA2 gene. Results should be available within approximately two-three weeks' time, at which point they will be disclosed by telephone to Ms. Tracie [name], as will any additional recommendations warranted by these results.

## 2020-04-04 ENCOUNTER — Ambulatory Visit: Payer: Medicare Other | Admitting: Oncology

## 2020-04-04 ENCOUNTER — Other Ambulatory Visit: Payer: Medicare Other

## 2020-04-04 ENCOUNTER — Ambulatory Visit: Payer: Medicare Other

## 2020-04-05 NOTE — Progress Notes (Signed)
Pharmacist Chemotherapy Monitoring - Follow Up Assessment    I verify that I have reviewed each item in the below checklist:  . Regimen for the patient is scheduled for the appropriate day and plan matches scheduled date. Marland Kitchen Appropriate non-routine labs are ordered dependent on drug ordered. . If applicable, additional medications reviewed and ordered per protocol based on lifetime cumulative doses and/or treatment regimen.   Plan for follow-up and/or issues identified: No . I-vent associated with next due treatment: No . MD and/or nursing notified: No  Jatavia Keltner D 04/05/2020 2:05 PM

## 2020-04-11 ENCOUNTER — Inpatient Hospital Stay: Payer: Medicare Other

## 2020-04-11 ENCOUNTER — Telehealth: Payer: Self-pay

## 2020-04-11 ENCOUNTER — Inpatient Hospital Stay: Payer: Medicare Other | Admitting: Adult Health

## 2020-04-11 NOTE — Progress Notes (Deleted)
Tracie [name]  [phone] [phone]) [phone]     ID: Tracie [name] DOB: 15-Nov-1951  MR#: [id number]  IDC#:[id number]  Patient Care Team: Bartholome Bill, MD as PCP - General (Family Medicine) [name], MD as Consulting Physician (General Surgery) Magrinat, [name], MD as Consulting Physician (Oncology) [name], MD as Attending Physician (Radiation Oncology) Mottinger, [place] DDS (Physical Therapy) Tracie Dresser, MD as Consulting Physician (Cardiology) [name], [name], NP as Nurse Practitioner (Hematology and Oncology) OTHER MD:   CHIEF COMPLAINT: Triple positive breast cancer  CURRENT TREATMENT: trastuzumab [Q 28 d]; letrozole; palbociclib   INTERVAL HISTORY: Tracie [name] returns today for follow-up and treatment of her triple positive breast cancer.   She has stage IV cancer, and recent MRI liver showed progression of disease.  Due to this, her treatment was changed to Trastuzumab, Letrozole and Palbociclib.  She did not realize that Letrozole had been prescribed, and she has not yet received the Palbociclib.    REVIEW OF SYSTEMS: Tracie [name] has   HISTORY OF CURRENT ILLNESS: From the original intake note:  Tracie [name] noted a change in her right breast sometime in July 2018. [facility] to report that she had found a "kernel" in her breast. She was advised to see her primary physician which she did. The patient was then set up for bilateral diagnostic mammography with tomography and right breast ultrasonography at [facility] 09/03/2017. The breast density was category I a. In the upper outer quadrant of the right breast there was a 2.1 cm high density mass, which was palpable. Also in the right breast more posteriorly there was a 1.5 cm high density mass which was felt to be a suspicious lymph node. Ultrasound of the right breast confirmed a 2.1 centimeter lobulated solid mass in the right breast upper outer quadrant  15.8 cm from the nipple in the 10:00 radiant. There was a 1.5 cm oval mass in the right axillary tail with a small rounded masses also suspicious for lymph node involvement. A total of 3 suspicious lymph nodes were identified.  On 09/03/2017 the patient underwent biopsy of the breast mass and the suspicious right axillary lymph node. The final pathology ([id number]) found invasive ductal carcinoma, grade 3, in both. Both tumors were estrogen receptor positive at 70-75%, and both were progesterone receptor negative. Both had an elevated proliferation marker at 90%. The mass in the breast was HER-2 negative, with a signals ratio of 1.25 and the number per cell 1.88. The lymph node mass however was HER-2 positive with a signals ratio of 2.57, and the number per cell 3.60.  The patient's subsequent history is as detailed below.   PAST MEDICAL HISTORY: Past Medical History:  Diagnosis Date  . Angina pectoris ([place])    history of   . Arthritis   . Asthma   . Cancer ([place])    right breast  . Constipation   . Diverticulitis   . Family history of lung cancer   . History of radiation therapy 04/07/18- 05/11/18   50 Gy in 25 fractions to right breast and regional nodes with four fields (no boost)  . History of radiation therapy 04/21/2018   T8 spine, 18 Gy in 1 fraction for a total dose of 18 Gy.   Marland Kitchen HPV in female   . Hyperlipidemia   . Hypertension   . Hypothyroidism   . Wears dentures   . Wears glasses     PAST SURGICAL HISTORY: Past  Surgical History:  Procedure Laterality Date  . BREAST LUMPECTOMY WITH RADIOACTIVE SEED AND SENTINEL LYMPH NODE BIOPSY Right 03/08/2018   Procedure: RIGHT BREAST RADIOACTIVE SEED GUIDED LUMPECTOMY WITH RIGHT AXILLARY RADIOACTIVE SEED TARGETED LYMPH NODE EXCISION AND SENTINEL LYMPH NODE BIOPSY, INJECT BLUE DYE RIGHT BREAST;  Surgeon: [name], MD;  Location: [facility];  Service: General;  Laterality: Right;  . CESAREAN SECTION    . COLON SURGERY  2012    sigmoidectomy  . COLOSTOMY    . COLOSTOMY TAKEDOWN  10/06/11  . IR BONE TUMOR(S)RF ABLATION  11/02/2017  . IR KYPHO THORACIC WITH BONE BIOPSY  11/02/2017  . IR RADIOLOGIST EVAL & MGMT  10/07/2017  . IR RADIOLOGIST EVAL & MGMT  11/25/2017  . PORTACATH PLACEMENT N/A 09/25/2017   Procedure: INSERTION PORT-A-CATH WITH ULTRA SOUND ERAS PATHWAY;  Surgeon: [name], MD;  Location: [facility];  Service: General;  Laterality: N/A;  . TONSILLECTOMY    . TUBAL LIGATION      FAMILY HISTORY: Family History  Problem Relation Age of Onset  . Cancer Mother 56       rectal/unknown type  . Hypertension Brother   . Stroke Brother   . Asthma Brother   . Hypertension Brother   . Asthma Son   . Asthma Brother   . Diabetes Maternal Aunt   . Diabetes Paternal Aunt   . Lung cancer Maternal Grandmother 87  . Heart Problems Paternal Grandmother   . Diabetes Maternal Aunt   . Heart attack Maternal Aunt 43   The patient has little information regarding her father. Her mother died at age 28 from a strange cancer--the patient does not know what it was, it may well have been cervical cancer from her description. The patient has one brother, no sisters. There is no history of breast or ovarian cancer in the family to the patient's knowledge    GYNECOLOGIC HISTORY:  No LMP recorded. Patient is postmenopausal.  menarche age 17, first live birth age 61 the patient is Tracie [name]. She stopped having periods at age 14. She never used oral contraceptives or hormone replacement.    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  She is divorced.  Her son [name] stays with her sometimes. He is a Art gallery manager and therefore not working during the pandemic. The 2 other children are KATHELINE BRENDLINGER, who lives in [place] and works in the theater and media, and Aliyana Dlugosz lives in [place], and is vice president of a [employer]. The patient has 5  grandchildren. She is a Psychologist, forensic.   ADVANCED DIRECTIVES: Not in place    HEALTH MAINTENANCE: Social History   Tobacco Use  . Smoking status: Current Some Day Smoker    Packs/day: 0.25    Years: 40.00    Pack years: 10.00    Types: Cigarettes  . Smokeless tobacco: Never Used  . Tobacco comment: she is smoking 2 or more daily, she plans to use nicotine patch.   Substance Use Topics  . Alcohol use: Yes    Comment: occasional  . Drug use: No    Colonoscopy: September 2012   PAP:  Bone density:09/03/2017    Allergies  Allergen Reactions  . Fish Allergy Anaphylaxis and Swelling    Swelling of hands and feet., non shellfish allergy  . Other Anaphylaxis    Tree nuts   . Ace Inhibitors Other (See Comments) and Cough  Cold like symptoms   . Tylenol [Acetaminophen] Other (See Comments)    Headache     Current Outpatient Medications  Medication Sig Dispense Refill  . ADVAIR DISKUS 250-50 MCG/DOSE AEPB INL 1 PUFF ITL Q 12 H    . albuterol (PROAIR HFA) 108 (90 Base) MCG/ACT inhaler INHALE 2 PUFFS BY MOUTH INTO THE LUNGS EVERY 6 HOURS AS NEEDED FOR WHEEZING    . amLODipine (NORVASC) 10 MG tablet Take 10 mg by mouth daily.     Marland Kitchen aspirin EC 81 MG tablet Take 1 tablet (81 mg total) by mouth daily. 90 tablet 3  . cetirizine (ZYRTEC) 10 MG tablet Take 10 mg by mouth daily as needed for allergies.   11  . letrozole (FEMARA) 2.5 MG tablet Take 1 tablet (2.5 mg total) by mouth daily. 90 tablet 4  . levothyroxine (SYNTHROID, LEVOTHROID) 50 MCG tablet Take 50 mcg by mouth daily.    Marland Kitchen lidocaine-prilocaine (EMLA) cream Apply 1 application topically as needed (port access). 30 g 0  . losartan-hydrochlorothiazide (HYZAAR) 100-12.5 MG tablet Take 1 tablet by mouth daily.   3  . metoprolol succinate (TOPROL-XL) 100 MG 24 hr tablet Take 1 tablet (100 mg total) by mouth daily. 30 tablet 4  . naproxen (NAPROSYN) 500 MG tablet TAKE 1 TABLET(500 MG) BY MOUTH TWICE DAILY WITH MEALS FOR BACK OR LEG PAIN     . nicotine (NICODERM CQ - DOSED IN MG/24 HOURS) 14 mg/24hr patch Place 1 patch (14 mg total) onto the skin daily. 28 patch 0  . nitroGLYCERIN (NITROSTAT) 0.4 MG SL tablet Place 1 tablet (0.4 mg total) under the tongue every 5 (five) minutes as needed for chest pain. 75 tablet 3  . ondansetron (ZOFRAN) 8 MG tablet Take 1 tablet (8 mg total) by mouth daily as needed for nausea or vomiting. 20 tablet 3  . palbociclib (IBRANCE) 125 MG tablet Take 1 tablet (125 mg total) by mouth daily. Take for 21 days on, 7 days off, repeat every 28 days. 21 tablet 6  . potassium chloride (KLOR-CON) 10 MEQ tablet Take 1 tablet (10 mEq total) by mouth daily. 90 tablet 1  . PRALUENT 75 MG/ML SOAJ INJECT 75 MG INTO THE SKIN EVERY 14 DAYS 2 mL 0   No current facility-administered medications for this visit.    OBJECTIVE: Middle-aged African-American woman in no acute distress There were no vitals filed for this visit.   There is no height or weight on file to calculate BMI.   Wt Readings from Last 3 Encounters:  03/14/20 159 lb (72.1 kg)  03/08/20 150 lb (68 kg)  02/22/20 159 lb 4.8 oz (72.3 kg)   ECOG FS:1   GENERAL: Patient is a well appearing female in no acute distress, examined in chair in chemo room HEENT:  Sclerae anicteric.  Mask in place. Neck is supple.  NODES:  No cervical, supraclavicular, or axillary lymphadenopathy palpated.  BREAST EXAM:  Deferred. LUNGS:  Clear to auscultation bilaterally.  No wheezes or rhonchi. HEART:  Regular rate and rhythm. No murmur appreciated. ABDOMEN:  Soft, nontender.  Positive, normoactive bowel sounds. No organomegaly palpated. EXTREMITIES:  No peripheral edema.   SKIN:  Clear with no obvious rashes or skin changes. No nail dyscrasia. NEURO:  Nonfocal. Well oriented.  Appropriate affect.     LAB RESULTS:  CMP     Component Value Date/Time   NA 138 03/14/2020 1248   NA 138 12/24/2017 0752   K 2.9 (LL) 03/14/2020 1248  K 3.3 (L) 12/24/2017 0752   CL 99  03/14/2020 1248   CO2 28 03/14/2020 1248   CO2 25 12/24/2017 0752   GLUCOSE 104 (H) 03/14/2020 1248   GLUCOSE 99 12/24/2017 0752   BUN 7 (L) 03/14/2020 1248   BUN 7.9 12/24/2017 0752   CREATININE 0.86 03/14/2020 1248   CREATININE 0.7 12/24/2017 0752   CALCIUM 13.7 (HH) 03/14/2020 1248   CALCIUM 9.2 12/24/2017 0752   PROT 8.2 (H) 03/14/2020 1248   PROT 6.3 (L) 12/24/2017 0752   ALBUMIN 2.9 (L) 03/14/2020 1248   ALBUMIN 3.4 (L) 12/24/2017 0752   AST 29 03/14/2020 1248   AST 15 12/24/2017 0752   ALT 11 03/14/2020 1248   ALT 18 12/24/2017 0752   ALKPHOS 437 (H) 03/14/2020 1248   ALKPHOS 93 12/24/2017 0752   BILITOT 0.5 03/14/2020 1248   BILITOT <0.22 12/24/2017 0752   GFRNONAA >60 03/14/2020 1248   GFRAA >60 03/14/2020 1248    No results found for: TOTALPROTELP, ALBUMINELP, A1GS, A2GS, BETS, BETA2SER, GAMS, MSPIKE, SPEI  No results found for: Nils Pyle, KAPLAMBRATIO  Lab Results  Component Value Date   WBC 8.9 03/14/2020   NEUTROABS 6.8 03/14/2020   HGB 9.6 (L) 03/14/2020   HCT 32.6 (L) 03/14/2020   MCV 74.4 (L) 03/14/2020   PLT 445 (H) 03/14/2020      Chemistry      Component Value Date/Time   NA 138 03/14/2020 1248   NA 138 12/24/2017 0752   K 2.9 (LL) 03/14/2020 1248   K 3.3 (L) 12/24/2017 0752   CL 99 03/14/2020 1248   CO2 28 03/14/2020 1248   CO2 25 12/24/2017 0752   BUN 7 (L) 03/14/2020 1248   BUN 7.9 12/24/2017 0752   CREATININE 0.86 03/14/2020 1248   CREATININE 0.7 12/24/2017 0752      Component Value Date/Time   CALCIUM 13.7 (HH) 03/14/2020 1248   CALCIUM 9.2 12/24/2017 0752   ALKPHOS 437 (H) 03/14/2020 1248   ALKPHOS 93 12/24/2017 0752   AST 29 03/14/2020 1248   AST 15 12/24/2017 0752   ALT 11 03/14/2020 1248   ALT 18 12/24/2017 0752   BILITOT 0.5 03/14/2020 1248   BILITOT <0.22 12/24/2017 0752       No results found for: LABCA2  No components found for: [id number]  No results found for: HGBA, HGBA2QUANT, HGBFQUANT,  HGBSQUAN (Hemoglobinopathy evaluation)   No results found for: LDH  No results found for: IRON, TIBC, IRONPCTSAT (Iron and TIBC)  No results found for: FERRITIN  Urinalysis    Component Value Date/Time   LABSPEC 1.020 02/10/2011 2058   PHURINE 6.0 02/10/2011 2058   HGBUR LARGE (A) 02/10/2011 2058   BILIRUBINUR MODERATE (A) 02/10/2011 2058   KETONESUR 15 (A) 02/10/2011 2058   PROTEINUR >=300 (A) 02/10/2011 2058   UROBILINOGEN >=8.0 02/10/2011 2058   NITRITE NEGATIVE 02/10/2011 2058   LEUKOCYTESUR  02/10/2011 2058    NEGATIVE Biochemical Testing Only. Please order routine urinalysis from main lab if confirmatory testing is needed.     STUDIES: ECHOCARDIOGRAM COMPLETE  Result Date: 03/26/2020    ECHOCARDIOGRAM REPORT   Patient Name:   HILDAGARD SOBECKI Date of Exam: 03/26/2020 Medical Rec #:  [id number]         Height:       63.0 in Accession #:    [id number]        Weight:       159.0 lb Date of Birth:  1951-04-28  BSA:          1.754 m Patient Age:    29 years          BP:           117/77 mmHg Patient Gender: F                 HR:           108 bpm. Exam Location:  Inpatient Procedure: 2D Echo, Color Doppler and Cardiac Doppler Indications:    I50.9* Heart failure (unspecified)  History:        Patient has prior history of Echocardiogram examinations, most                 recent 11/21/2019. Risk Factors:Hypertension and Dyslipidemia.                 Lung, liver, and breast cancer.  Sonographer:    Raquel Sarna Senior RDCS Referring Phys: [place] Comments: Technically difficult due to rib shadowing and respiratory variation. IMPRESSIONS  1. Technically difficult study. Definity contrast not given. Poor endocardial definition - LVEF appears low normal.  2. Left ventricular ejection fraction, by estimation, is 50 to 55%. The left ventricle has low normal function. Left ventricular endocardial border not optimally defined to evaluate regional wall motion. There is  moderate left ventricular hypertrophy. Left ventricular diastolic parameters are consistent with Grade I diastolic dysfunction (impaired relaxation).  3. Right ventricular systolic function is normal. The right ventricular size is normal. There is normal pulmonary artery systolic pressure.  4. The mitral valve was not well visualized. Trivial mitral valve regurgitation.  5. The aortic valve was not well visualized. Aortic valve regurgitation is mild. Aortic regurgitation PHT measures 512 msec.  6. The inferior vena cava is normal in size with greater than 50% respiratory variability, suggesting right atrial pressure of 3 mmHg. Comparison(s): Changes from prior study are noted. 11/21/2019: LVEF 50%. FINDINGS  Left Ventricle: Left ventricular ejection fraction, by estimation, is 50 to 55%. The left ventricle has low normal function. Left ventricular endocardial border not optimally defined to evaluate regional wall motion. The left ventricular internal cavity  size was normal in size. There is moderate left ventricular hypertrophy. Left ventricular diastolic parameters are consistent with Grade I diastolic dysfunction (impaired relaxation). Normal left ventricular filling pressure. Right Ventricle: The right ventricular size is normal. No increase in right ventricular wall thickness. Right ventricular systolic function is normal. There is normal pulmonary artery systolic pressure. The tricuspid regurgitant velocity is 1.52 m/s, and  with an assumed right atrial pressure of 3 mmHg, the estimated right ventricular systolic pressure is 74.1 mmHg. Left Atrium: Left atrial size was normal in size. Right Atrium: Right atrial size was normal in size. Pericardium: There is no evidence of pericardial effusion. Mitral Valve: The mitral valve was not well visualized. Trivial mitral valve regurgitation. Tricuspid Valve: The tricuspid valve is grossly normal. Tricuspid valve regurgitation is trivial. Aortic Valve: The aortic valve  was not well visualized. Aortic valve regurgitation is mild. Aortic regurgitation PHT measures 512 msec. Pulmonic Valve: The pulmonic valve was not well visualized. Pulmonic valve regurgitation is not visualized. Aorta: The aortic root is normal in size and structure. Venous: The inferior vena cava is normal in size with greater than 50% respiratory variability, suggesting right atrial pressure of 3 mmHg. IAS/Shunts: The interatrial septum is aneurysmal. The interatrial septum was not well visualized.  LEFT VENTRICLE PLAX 2D LVIDd:  3.10 cm  Diastology LVIDs:         2.30 cm  LV e' lateral: 6.42 cm/s LV PW:         1.00 cm  LV e' medial:  4.79 cm/s LV IVS:        1.40 cm LVOT diam:     2.10 cm LV SV:         44 LV SV Index:   25 LVOT Area:     3.46 cm  RIGHT VENTRICLE RV S prime:     13.90 cm/s TAPSE (M-mode): 1.6 cm LEFT ATRIUM             Index       RIGHT ATRIUM           Index LA diam:        2.90 cm 1.65 cm/m  RA Area:     13.20 cm LA Vol (A2C):   52.9 ml 30.16 ml/m RA Volume:   29.90 ml  17.05 ml/m LA Vol (A4C):   30.5 ml 17.39 ml/m LA Biplane Vol: 41.0 ml 23.38 ml/m  AORTIC VALVE LVOT Vmax:   93.80 cm/s LVOT Vmean:  61.000 cm/s LVOT VTI:    0.126 m AI PHT:      512 msec  AORTA Ao Root diam: 3.00 cm TRICUSPID VALVE TR Peak grad:   9.2 mmHg TR Vmax:        152.00 cm/s  SHUNTS Systemic VTI:  0.13 m Systemic Diam: 2.10 cm Lyman Bishop MD Electronically signed by Lyman Bishop MD Signature Date/Time: 03/26/2020/3:37:42 PM    Final     ELIGIBLE FOR AVAILABLE RESEARCH PROTOCOL: no   ASSESSMENT: 69 y.o. [place] woman status post right breast upper outer quadrant and right axillary lymph node biopsy 09/03/2017, both positive for invasive ductal carcinoma, grade 3, estrogen receptor positive, progesterone receptor negative, with an MIB-1 of 90%, the lymph node being HER-2 positive, the breast mass HER-2 negative  (a) staging studies September 24, 2017 show a lytic lesion in T8, possible areas of  concern at L1-L2  (b) biopsy/kyphoplasty/osteocool of T8 lesion 11/02/2017 confirms metastatic adenocarcinoma, 20% estrogen receptor positive, progesterone receptor negative  (c) PET scan 11/30/2017 shows no visceral disease, minimal metabolic activity at T8, no other bone lesions  (d) CT of the chest 12/05/2019 shows new lung and liver lesions; bone scan was negative  (1) neoadjuvant chemotherapy with carboplatin, docetaxel, trastuzumab, and pertuzumab started 10/01/2017, completed 6 cycles 01/26/2018  (a) Docetaxel dropped after three cycles and Gemcitabine/Carbo started on 12/15/2017.   (2) trastuzumab to continue indefinitely  (a) echocardiogram 11/38/2018 shows an ejection fraction in the 55-60% range  (b) echocardiogram 06/17/2019 shows an ejection fraction in the 50-55% range  (c) echocardiogram 11/21/2019 shows an ejection fraction in the 50% range, which is mildly decreased.  (d) trastuzumab discontinued after 01/15/2019 dose with disease progression  (3) right lumpectomy and sentinel lymph node sampling 03/08/2018  Showed a pT1(mic) pN0 invasive ductal carcinoma, grade 2, with negative margins.  (4) adjuvant radiation  04-07-18 to 05-11-18                                              (a) SITE/DOSE:  Right Breast and Axilla, SCV nodes / 50 Gy in 25 fx               (b) radiation  to her oligo metastatic disease at T8 (18 Gy) in 1 fraction, 04/21/2018  (5) anastrozole started 05/29/2018  (a) bone density 07/19/2019 shows a T score of -0.9, which is normal.  (b) discontinued March 2011 with progression  (6) zolendronate started 01/19/2019, repeated every 12 weeks   (7) T-DM1 started 12/15/2020, last (4th) dose 02/22/2020  (a) discontinued after 4th dose with evidence of progression  (8) measurable disease:  (a) liver MRI 12/12/2019 shows multiple hepatic masses, largest measuring 5.1 cm  (b) liver biopsy 12/14/2019 confirms estrogen receptor positive cancer (60% with weak staining  intensity), progesterone receptor negative and HER-2 not amplified  (c) repeat liver MRI on 03/05/2020 shows evidence of progression  (9) CARIS results on the 12/14/2019 surgical sample show a pathogenic BRCA2 variant (exon 11, P.154 5 FS), a high degree of genomic loss of heterozygosity, negative AKA T1, negative androgen receptor, negative BRCA1, negative HER-2, negative PD-L1, and negative PI K3 CA.  (a) olaparib & lowers are options in this case  (10) to resume trastuzumab 03/18 /2021  (11) to start letrozole and palbociclib 03/15/2020  PLAN: Tracie [name]  Total encounter time: 30 minutes*  Wilber Bihari, NP 04/03/2020 9:00 AM Medical Oncology and Hematology [facility] [place], [place] [address] Tel. [phone]    Fax. [phone]    *Total Encounter Time as defined by the Centers for Medicare and Medicaid Services includes, in addition to the face-to-face time of a patient visit (documented in the note above) non-face-to-face time: obtaining and reviewing outside history, ordering and reviewing medications, tests or procedures, care coordination (communications with other health care professionals or caregivers) and documentation in the medical record.

## 2020-04-11 NOTE — Telephone Encounter (Signed)
TC to pt to inquire about missed appointments this morning. Phone went straight to voicemail. Left voicemail asking patient to return call to [facility] and ask for [employer]. Will try call again later.

## 2020-04-17 ENCOUNTER — Other Ambulatory Visit: Payer: Self-pay | Admitting: Oncology

## 2020-04-17 NOTE — Progress Notes (Signed)
Tracie [name] died 04/02/2020.  I do not have details but in the last note she had a low potassium and a high calcium.  Of course she had metastatic breast cancer with liver and lung involvement and I think that really was the ultimate cause of death.

## 2020-04-26 ENCOUNTER — Encounter (HOSPITAL_COMMUNITY): Payer: Medicare Other | Admitting: Cardiology

## 2020-04-28 DEATH — deceased

## 2020-05-03 ENCOUNTER — Other Ambulatory Visit: Payer: Self-pay | Admitting: Oncology

## 2020-06-14 ENCOUNTER — Other Ambulatory Visit: Payer: Self-pay | Admitting: Oncology

## 2020-06-14 DIAGNOSIS — E876 Hypokalemia: Secondary | ICD-10-CM

## 2023-11-18 NOTE — Telephone Encounter (Signed)
Telephone call
